# Patient Record
Sex: Female | Born: 1955 | Race: White | Hispanic: No | Marital: Single | Smoking: Never smoker
Health system: Southern US, Community
[De-identification: ages and names within clinical notes are randomized; demographics above are authoritative.]

## PROBLEM LIST (undated history)

## (undated) DIAGNOSIS — I499 Cardiac arrhythmia, unspecified: Secondary | ICD-10-CM

## (undated) DIAGNOSIS — R002 Palpitations: Secondary | ICD-10-CM

## (undated) DIAGNOSIS — E785 Hyperlipidemia, unspecified: Secondary | ICD-10-CM

## (undated) DIAGNOSIS — C801 Malignant (primary) neoplasm, unspecified: Secondary | ICD-10-CM

## (undated) DIAGNOSIS — M5412 Radiculopathy, cervical region: Secondary | ICD-10-CM

## (undated) DIAGNOSIS — M797 Fibromyalgia: Secondary | ICD-10-CM

## (undated) DIAGNOSIS — K219 Gastro-esophageal reflux disease without esophagitis: Secondary | ICD-10-CM

## (undated) DIAGNOSIS — R011 Cardiac murmur, unspecified: Secondary | ICD-10-CM

## (undated) DIAGNOSIS — Z8659 Personal history of other mental and behavioral disorders: Secondary | ICD-10-CM

## (undated) DIAGNOSIS — R928 Other abnormal and inconclusive findings on diagnostic imaging of breast: Secondary | ICD-10-CM

## (undated) DIAGNOSIS — F419 Anxiety disorder, unspecified: Secondary | ICD-10-CM

## (undated) DIAGNOSIS — IMO0002 Reserved for concepts with insufficient information to code with codable children: Secondary | ICD-10-CM

## (undated) DIAGNOSIS — D649 Anemia, unspecified: Secondary | ICD-10-CM

## (undated) DIAGNOSIS — K449 Diaphragmatic hernia without obstruction or gangrene: Secondary | ICD-10-CM

## (undated) DIAGNOSIS — R0602 Shortness of breath: Secondary | ICD-10-CM

## (undated) DIAGNOSIS — L570 Actinic keratosis: Secondary | ICD-10-CM

## (undated) DIAGNOSIS — F32A Depression, unspecified: Secondary | ICD-10-CM

## (undated) DIAGNOSIS — J449 Chronic obstructive pulmonary disease, unspecified: Secondary | ICD-10-CM

## (undated) DIAGNOSIS — J45909 Unspecified asthma, uncomplicated: Secondary | ICD-10-CM

## (undated) DIAGNOSIS — F329 Major depressive disorder, single episode, unspecified: Secondary | ICD-10-CM

## (undated) DIAGNOSIS — M5030 Other cervical disc degeneration, unspecified cervical region: Secondary | ICD-10-CM

## (undated) DIAGNOSIS — Z915 Personal history of self-harm: Secondary | ICD-10-CM

## (undated) DIAGNOSIS — G4700 Insomnia, unspecified: Secondary | ICD-10-CM

## (undated) DIAGNOSIS — I2510 Atherosclerotic heart disease of native coronary artery without angina pectoris: Secondary | ICD-10-CM

## (undated) DIAGNOSIS — Z8489 Family history of other specified conditions: Secondary | ICD-10-CM

## (undated) DIAGNOSIS — G8929 Other chronic pain: Secondary | ICD-10-CM

## (undated) DIAGNOSIS — K589 Irritable bowel syndrome without diarrhea: Secondary | ICD-10-CM

## (undated) DIAGNOSIS — Z8719 Personal history of other diseases of the digestive system: Secondary | ICD-10-CM

## (undated) DIAGNOSIS — N809 Endometriosis, unspecified: Secondary | ICD-10-CM

## (undated) HISTORY — DX: Depression, unspecified: F32.A

## (undated) HISTORY — DX: Atherosclerotic heart disease of native coronary artery without angina pectoris: I25.10

## (undated) HISTORY — DX: Insomnia, unspecified: G47.00

## (undated) HISTORY — DX: Shortness of breath: R06.02

## (undated) HISTORY — DX: Gastro-esophageal reflux disease without esophagitis: K21.9

## (undated) HISTORY — DX: Endometriosis, unspecified: N80.9

## (undated) HISTORY — DX: Major depressive disorder, single episode, unspecified: F32.9

## (undated) HISTORY — DX: Hyperlipidemia, unspecified: E78.5

## (undated) HISTORY — DX: Reserved for concepts with insufficient information to code with codable children: IMO0002

## (undated) HISTORY — PX: ANKLE SURGERY: SHX546

## (undated) HISTORY — PX: BREAST SURGERY: SHX581

## (undated) HISTORY — PX: FRACTURE SURGERY: SHX138

## (undated) HISTORY — DX: Palpitations: R00.2

## (undated) HISTORY — PX: SKIN LESION EXCISION: SHX2412

## (undated) HISTORY — DX: Anxiety disorder, unspecified: F41.9

## (undated) HISTORY — DX: Malignant (primary) neoplasm, unspecified: C80.1

## (undated) HISTORY — DX: Personal history of other mental and behavioral disorders: Z86.59

## (undated) HISTORY — DX: Personal history of self-harm: Z91.5

## (undated) HISTORY — DX: Other abnormal and inconclusive findings on diagnostic imaging of breast: R92.8

## (undated) HISTORY — DX: Chronic obstructive pulmonary disease, unspecified: J44.9

## (undated) HISTORY — DX: Radiculopathy, cervical region: M54.12

## (undated) HISTORY — DX: Family history of other specified conditions: Z84.89

## (undated) HISTORY — DX: Personal history of other diseases of the digestive system: Z87.19

## (undated) HISTORY — DX: Unspecified asthma, uncomplicated: J45.909

## (undated) HISTORY — DX: Diaphragmatic hernia without obstruction or gangrene: K44.9

## (undated) HISTORY — DX: Fibromyalgia: M79.7

## (undated) HISTORY — DX: Actinic keratosis: L57.0

## (undated) HISTORY — DX: Cardiac arrhythmia, unspecified: I49.9

## (undated) HISTORY — DX: Irritable bowel syndrome, unspecified: K58.9

## (undated) HISTORY — DX: Other chronic pain: G89.29

---
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
## 1984-09-12 HISTORY — PX: ABDOMINAL HYSTERECTOMY: SHX81

## 1984-09-12 HISTORY — PX: OOPHORECTOMY: SHX86

## 1998-04-24 ENCOUNTER — Inpatient Hospital Stay (HOSPITAL_COMMUNITY): Admission: EM | Admit: 1998-04-24 | Discharge: 1998-05-04 | Payer: Self-pay | Admitting: Emergency Medicine

## 1999-02-21 ENCOUNTER — Inpatient Hospital Stay (HOSPITAL_COMMUNITY): Admission: EM | Admit: 1999-02-21 | Discharge: 1999-03-02 | Payer: Self-pay | Admitting: Emergency Medicine

## 2004-07-08 ENCOUNTER — Ambulatory Visit: Payer: Self-pay | Admitting: Anesthesiology

## 2004-08-03 ENCOUNTER — Emergency Department: Payer: Self-pay | Admitting: Emergency Medicine

## 2004-08-09 ENCOUNTER — Ambulatory Visit: Payer: Self-pay | Admitting: Family Medicine

## 2004-08-19 ENCOUNTER — Emergency Department: Payer: Self-pay | Admitting: Internal Medicine

## 2004-08-19 ENCOUNTER — Other Ambulatory Visit: Payer: Self-pay

## 2004-09-15 ENCOUNTER — Ambulatory Visit: Payer: Self-pay | Admitting: Anesthesiology

## 2004-10-12 ENCOUNTER — Ambulatory Visit: Payer: Self-pay | Admitting: Anesthesiology

## 2004-10-13 ENCOUNTER — Ambulatory Visit: Payer: Self-pay | Admitting: Anesthesiology

## 2004-10-18 ENCOUNTER — Ambulatory Visit: Payer: Self-pay | Admitting: Anesthesiology

## 2004-11-15 ENCOUNTER — Ambulatory Visit: Payer: Self-pay | Admitting: Anesthesiology

## 2004-12-20 ENCOUNTER — Ambulatory Visit: Payer: Self-pay | Admitting: Anesthesiology

## 2005-01-25 ENCOUNTER — Ambulatory Visit: Payer: Self-pay | Admitting: Anesthesiology

## 2005-02-23 ENCOUNTER — Ambulatory Visit: Payer: Self-pay | Admitting: Anesthesiology

## 2005-03-30 ENCOUNTER — Ambulatory Visit: Payer: Self-pay | Admitting: Anesthesiology

## 2005-04-21 ENCOUNTER — Ambulatory Visit: Payer: Self-pay | Admitting: Anesthesiology

## 2005-04-28 ENCOUNTER — Ambulatory Visit: Payer: Self-pay | Admitting: Anesthesiology

## 2005-05-26 ENCOUNTER — Ambulatory Visit: Payer: Self-pay | Admitting: Anesthesiology

## 2005-06-04 ENCOUNTER — Emergency Department: Payer: Self-pay | Admitting: Emergency Medicine

## 2005-06-22 ENCOUNTER — Ambulatory Visit: Payer: Self-pay | Admitting: Anesthesiology

## 2005-07-28 ENCOUNTER — Ambulatory Visit: Payer: Self-pay | Admitting: Anesthesiology

## 2005-09-19 ENCOUNTER — Ambulatory Visit: Payer: Self-pay | Admitting: Anesthesiology

## 2005-10-26 ENCOUNTER — Ambulatory Visit: Payer: Self-pay | Admitting: Anesthesiology

## 2005-11-09 ENCOUNTER — Ambulatory Visit: Payer: Self-pay | Admitting: Internal Medicine

## 2005-11-22 ENCOUNTER — Ambulatory Visit: Payer: Self-pay | Admitting: Anesthesiology

## 2005-12-12 ENCOUNTER — Ambulatory Visit: Payer: Self-pay | Admitting: Anesthesiology

## 2006-01-30 ENCOUNTER — Ambulatory Visit: Payer: Self-pay | Admitting: Anesthesiology

## 2006-02-27 ENCOUNTER — Ambulatory Visit: Payer: Self-pay | Admitting: Anesthesiology

## 2006-03-22 ENCOUNTER — Ambulatory Visit: Payer: Self-pay | Admitting: Anesthesiology

## 2006-04-06 ENCOUNTER — Ambulatory Visit: Payer: Self-pay | Admitting: Gastroenterology

## 2006-04-26 ENCOUNTER — Ambulatory Visit: Payer: Self-pay | Admitting: Anesthesiology

## 2006-05-24 ENCOUNTER — Ambulatory Visit: Payer: Self-pay | Admitting: Anesthesiology

## 2006-06-21 ENCOUNTER — Ambulatory Visit: Payer: Self-pay | Admitting: Anesthesiology

## 2006-08-09 ENCOUNTER — Ambulatory Visit: Payer: Self-pay | Admitting: Anesthesiology

## 2006-08-31 ENCOUNTER — Ambulatory Visit: Payer: Self-pay | Admitting: Family Medicine

## 2006-09-21 ENCOUNTER — Ambulatory Visit: Payer: Self-pay | Admitting: Family Medicine

## 2006-11-09 ENCOUNTER — Ambulatory Visit: Payer: Self-pay | Admitting: Anesthesiology

## 2006-12-11 ENCOUNTER — Ambulatory Visit: Payer: Self-pay | Admitting: Anesthesiology

## 2007-01-02 ENCOUNTER — Ambulatory Visit: Payer: Self-pay | Admitting: Anesthesiology

## 2007-01-10 ENCOUNTER — Ambulatory Visit: Payer: Self-pay | Admitting: Anesthesiology

## 2007-02-07 ENCOUNTER — Ambulatory Visit: Payer: Self-pay | Admitting: Anesthesiology

## 2007-03-08 DIAGNOSIS — C4492 Squamous cell carcinoma of skin, unspecified: Secondary | ICD-10-CM

## 2007-03-08 HISTORY — DX: Squamous cell carcinoma of skin, unspecified: C44.92

## 2007-03-28 ENCOUNTER — Ambulatory Visit: Payer: Self-pay | Admitting: Anesthesiology

## 2007-05-23 ENCOUNTER — Ambulatory Visit: Payer: Self-pay | Admitting: Anesthesiology

## 2007-07-11 ENCOUNTER — Ambulatory Visit: Payer: Self-pay | Admitting: Anesthesiology

## 2007-08-06 ENCOUNTER — Ambulatory Visit: Payer: Self-pay | Admitting: Anesthesiology

## 2007-09-26 ENCOUNTER — Emergency Department: Payer: Self-pay | Admitting: Emergency Medicine

## 2007-10-04 ENCOUNTER — Ambulatory Visit: Payer: Self-pay | Admitting: Anesthesiology

## 2007-11-08 ENCOUNTER — Ambulatory Visit: Payer: Self-pay | Admitting: Anesthesiology

## 2007-11-29 ENCOUNTER — Ambulatory Visit: Payer: Self-pay | Admitting: Anesthesiology

## 2008-01-02 ENCOUNTER — Ambulatory Visit: Payer: Self-pay | Admitting: Anesthesiology

## 2008-01-31 ENCOUNTER — Ambulatory Visit: Payer: Self-pay | Admitting: Anesthesiology

## 2008-02-18 ENCOUNTER — Ambulatory Visit: Payer: Self-pay | Admitting: Family Medicine

## 2008-02-21 ENCOUNTER — Ambulatory Visit: Payer: Self-pay | Admitting: Anesthesiology

## 2008-04-24 DIAGNOSIS — C44529 Squamous cell carcinoma of skin of other part of trunk: Secondary | ICD-10-CM

## 2008-04-24 DIAGNOSIS — C4491 Basal cell carcinoma of skin, unspecified: Secondary | ICD-10-CM

## 2008-04-24 HISTORY — DX: Squamous cell carcinoma of skin of other part of trunk: C44.529

## 2008-04-24 HISTORY — DX: Basal cell carcinoma of skin, unspecified: C44.91

## 2008-06-03 ENCOUNTER — Ambulatory Visit: Payer: Self-pay | Admitting: Anesthesiology

## 2008-06-30 ENCOUNTER — Ambulatory Visit: Payer: Self-pay | Admitting: Anesthesiology

## 2008-07-31 ENCOUNTER — Ambulatory Visit: Payer: Self-pay | Admitting: Anesthesiology

## 2008-09-01 ENCOUNTER — Ambulatory Visit: Payer: Self-pay | Admitting: Anesthesiology

## 2008-09-25 ENCOUNTER — Ambulatory Visit: Payer: Self-pay | Admitting: Anesthesiology

## 2008-11-06 ENCOUNTER — Ambulatory Visit: Payer: Self-pay | Admitting: Anesthesiology

## 2009-01-13 ENCOUNTER — Ambulatory Visit: Payer: Self-pay | Admitting: Anesthesiology

## 2009-04-22 ENCOUNTER — Ambulatory Visit: Payer: Self-pay | Admitting: Family Medicine

## 2009-05-19 ENCOUNTER — Ambulatory Visit: Payer: Self-pay | Admitting: Anesthesiology

## 2009-06-26 ENCOUNTER — Ambulatory Visit: Payer: Self-pay | Admitting: Anesthesiology

## 2009-07-27 ENCOUNTER — Ambulatory Visit: Payer: Self-pay | Admitting: Anesthesiology

## 2009-10-01 ENCOUNTER — Ambulatory Visit: Payer: Self-pay | Admitting: Anesthesiology

## 2009-10-29 ENCOUNTER — Ambulatory Visit: Payer: Self-pay | Admitting: Anesthesiology

## 2010-01-06 ENCOUNTER — Ambulatory Visit: Payer: Self-pay | Admitting: Anesthesiology

## 2010-01-28 ENCOUNTER — Ambulatory Visit: Payer: Self-pay | Admitting: Anesthesiology

## 2010-03-10 ENCOUNTER — Ambulatory Visit: Payer: Self-pay | Admitting: Anesthesiology

## 2010-03-19 ENCOUNTER — Ambulatory Visit: Payer: Self-pay | Admitting: Anesthesiology

## 2010-03-31 ENCOUNTER — Ambulatory Visit: Payer: Self-pay | Admitting: Anesthesiology

## 2010-05-11 ENCOUNTER — Ambulatory Visit: Payer: Self-pay | Admitting: Family Medicine

## 2010-05-19 ENCOUNTER — Ambulatory Visit: Payer: Self-pay | Admitting: Anesthesiology

## 2010-06-16 ENCOUNTER — Ambulatory Visit: Payer: Self-pay | Admitting: Anesthesiology

## 2010-07-23 ENCOUNTER — Emergency Department: Payer: Self-pay | Admitting: Emergency Medicine

## 2010-07-23 ENCOUNTER — Ambulatory Visit: Payer: Self-pay | Admitting: Anesthesiology

## 2010-08-13 ENCOUNTER — Ambulatory Visit: Payer: Self-pay | Admitting: Anesthesiology

## 2010-09-30 ENCOUNTER — Ambulatory Visit: Payer: Self-pay | Admitting: Anesthesiology

## 2010-10-21 ENCOUNTER — Ambulatory Visit: Payer: Self-pay | Admitting: Anesthesiology

## 2010-11-11 ENCOUNTER — Ambulatory Visit: Payer: Self-pay | Admitting: Anesthesiology

## 2010-12-22 ENCOUNTER — Ambulatory Visit: Payer: Self-pay | Admitting: Anesthesiology

## 2011-02-23 ENCOUNTER — Ambulatory Visit: Payer: Self-pay | Admitting: Anesthesiology

## 2011-05-10 ENCOUNTER — Ambulatory Visit: Payer: Self-pay | Admitting: Anesthesiology

## 2011-06-03 ENCOUNTER — Ambulatory Visit: Payer: Self-pay | Admitting: Anesthesiology

## 2011-06-21 ENCOUNTER — Ambulatory Visit: Payer: Self-pay | Admitting: Anesthesiology

## 2011-09-27 ENCOUNTER — Ambulatory Visit: Payer: Self-pay | Admitting: Family Medicine

## 2011-11-02 ENCOUNTER — Ambulatory Visit: Payer: Self-pay | Admitting: Anesthesiology

## 2011-12-26 ENCOUNTER — Ambulatory Visit: Payer: Self-pay | Admitting: Pain Medicine

## 2011-12-26 ENCOUNTER — Other Ambulatory Visit: Payer: Self-pay | Admitting: Pain Medicine

## 2011-12-26 LAB — FOLATE: Folic Acid: 16.2 ng/mL (ref 3.1–100.0)

## 2012-02-09 ENCOUNTER — Ambulatory Visit: Payer: Self-pay | Admitting: Pain Medicine

## 2012-03-06 ENCOUNTER — Ambulatory Visit: Payer: Self-pay | Admitting: Pain Medicine

## 2012-03-06 ENCOUNTER — Other Ambulatory Visit: Payer: Self-pay | Admitting: Pain Medicine

## 2012-03-06 LAB — PLATELET FUNCTION ASSAY: COL/EPI PLT FXN SCRN: 88 Seconds

## 2012-03-22 ENCOUNTER — Ambulatory Visit: Payer: Self-pay | Admitting: Pain Medicine

## 2012-04-18 ENCOUNTER — Ambulatory Visit: Payer: Self-pay | Admitting: Pain Medicine

## 2012-04-24 ENCOUNTER — Ambulatory Visit: Payer: Self-pay | Admitting: Pain Medicine

## 2012-05-23 ENCOUNTER — Ambulatory Visit: Payer: Self-pay | Admitting: Pain Medicine

## 2012-06-05 ENCOUNTER — Ambulatory Visit: Payer: Self-pay | Admitting: Pain Medicine

## 2012-06-28 ENCOUNTER — Ambulatory Visit: Payer: Self-pay | Admitting: Pain Medicine

## 2012-07-18 ENCOUNTER — Ambulatory Visit: Payer: Self-pay | Admitting: Pain Medicine

## 2012-09-25 ENCOUNTER — Ambulatory Visit: Payer: Self-pay | Admitting: Pain Medicine

## 2012-11-09 ENCOUNTER — Emergency Department: Payer: Self-pay | Admitting: Emergency Medicine

## 2012-11-09 LAB — CK TOTAL AND CKMB (NOT AT ARMC): CK-MB: <0.5 ng/mL — ABNORMAL LOW (ref 0.5–3.6)

## 2012-11-09 LAB — COMPREHENSIVE METABOLIC PANEL
Albumin: 3.1 g/dL — ABNORMAL LOW (ref 3.4–5.0)
Alkaline Phosphatase: 114 U/L (ref 50–136)
BUN: 8 mg/dL (ref 7–18)
Bilirubin,Total: 0.2 mg/dL (ref 0.2–1.0)
Calcium, Total: 9.1 mg/dL (ref 8.5–10.1)
Chloride: 101 mmol/L (ref 98–107)
Co2: 24 mmol/L (ref 21–32)
EGFR (African American): >60
Glucose: 151 mg/dL — ABNORMAL HIGH (ref 65–99)
Osmolality: 271 (ref 275–301)
Potassium: 4.4 mmol/L (ref 3.5–5.1)
SGOT(AST): 20 U/L (ref 15–37)
Sodium: 135 mmol/L — ABNORMAL LOW (ref 136–145)

## 2012-11-09 LAB — CBC
HCT: 36.1 % (ref 35.0–47.0)
HGB: 12.3 g/dL (ref 12.0–16.0)
MCH: 32.6 pg (ref 26.0–34.0)
MCV: 96 fL (ref 80–100)
Platelet: 415 10*3/uL (ref 150–440)
RBC: 3.76 10*6/uL — ABNORMAL LOW (ref 3.80–5.20)
RDW: 13.3 % (ref 11.5–14.5)

## 2012-11-28 ENCOUNTER — Ambulatory Visit: Payer: Self-pay | Admitting: Pain Medicine

## 2012-12-25 ENCOUNTER — Ambulatory Visit: Payer: Self-pay | Admitting: Pain Medicine

## 2013-01-16 ENCOUNTER — Ambulatory Visit: Payer: Self-pay | Admitting: Pain Medicine

## 2013-01-16 ENCOUNTER — Other Ambulatory Visit: Payer: Self-pay | Admitting: Pain Medicine

## 2013-01-16 LAB — BASIC METABOLIC PANEL WITH GFR
Anion Gap: 4 — ABNORMAL LOW
BUN: 12 mg/dL
Calcium, Total: 8.7 mg/dL
Chloride: 103 mmol/L
Co2: 28 mmol/L
Creatinine: 0.66 mg/dL
EGFR (African American): >60
EGFR (Non-African Amer.): >60
Glucose: 131 mg/dL — ABNORMAL HIGH
Osmolality: 272
Potassium: 4.4 mmol/L
Sodium: 135 mmol/L — ABNORMAL LOW

## 2013-03-22 ENCOUNTER — Ambulatory Visit: Payer: Self-pay | Admitting: Pain Medicine

## 2013-04-18 ENCOUNTER — Ambulatory Visit: Payer: Self-pay | Admitting: Pain Medicine

## 2013-05-01 ENCOUNTER — Ambulatory Visit: Payer: Self-pay | Admitting: Pain Medicine

## 2013-06-14 ENCOUNTER — Ambulatory Visit: Payer: Self-pay | Admitting: Pain Medicine

## 2013-06-14 ENCOUNTER — Other Ambulatory Visit: Payer: Self-pay | Admitting: Pain Medicine

## 2013-06-14 LAB — BASIC METABOLIC PANEL
Co2: 24 mmol/L (ref 21–32)
Creatinine: 0.94 mg/dL (ref 0.60–1.30)
EGFR (Non-African Amer.): >60
Glucose: 113 mg/dL — ABNORMAL HIGH (ref 65–99)
Sodium: 135 mmol/L — ABNORMAL LOW (ref 136–145)

## 2013-09-20 ENCOUNTER — Ambulatory Visit: Payer: Self-pay | Admitting: Pain Medicine

## 2013-12-16 ENCOUNTER — Ambulatory Visit: Payer: Self-pay | Admitting: Family Medicine

## 2014-01-07 ENCOUNTER — Ambulatory Visit: Payer: Self-pay | Admitting: Family Medicine

## 2014-02-10 ENCOUNTER — Ambulatory Visit: Payer: Self-pay | Admitting: Family Medicine

## 2014-02-14 LAB — PATHOLOGY REPORT

## 2014-02-21 ENCOUNTER — Ambulatory Visit: Payer: Self-pay | Admitting: Pain Medicine

## 2014-02-25 ENCOUNTER — Encounter: Payer: Self-pay | Admitting: General Surgery

## 2014-02-25 ENCOUNTER — Ambulatory Visit (INDEPENDENT_AMBULATORY_CARE_PROVIDER_SITE_OTHER): Payer: Medicare Other | Admitting: General Surgery

## 2014-02-25 VITALS — BP 112/78 | HR 70 | Resp 12 | Ht 63.0 in | Wt 161.0 lb

## 2014-02-25 DIAGNOSIS — R928 Other abnormal and inconclusive findings on diagnostic imaging of breast: Secondary | ICD-10-CM | POA: Insufficient documentation

## 2014-02-25 DIAGNOSIS — R920 Mammographic microcalcification found on diagnostic imaging of breast: Secondary | ICD-10-CM

## 2014-02-25 HISTORY — DX: Other abnormal and inconclusive findings on diagnostic imaging of breast: R92.8

## 2014-02-25 NOTE — Progress Notes (Signed)
Patient ID: Zoe Sanchez, female   DOB: 13-Aug-1956, 58 y.o.   MRN: [id number]  Chief Complaint  Patient presents with  . Other    mammogram    HPI Zoe Sanchez is a 58 y.o. female who presents for a breast evaluation. The most recent mammogram and right breast biopsy was done on 02/10/14. Patient does perform regular self breast checks and gets regular mammograms done.  States she can not feel the area of concern. Denies any breast injury. Denies family history of breast cancer.  HPI  Past Medical History  Diagnosis Date  . Asthma   . Degenerative disk disease   . Fibromyalgia   . Irritable bowel syndrome   . Endometriosis   . Insomnia   . Chronic pain     Past Surgical History  Procedure Laterality Date  . Abdominal hysterectomy  1986  . Tonsillectomy  1957  . Breast biopsy Right 2015    microcalcifications  . Skin lesion excision      basal cell and squamous cell    No family history on file.  Social History History  Substance Use Topics  . Smoking status: Never Smoker   . Smokeless tobacco: Not on file  . Alcohol Use: No    Allergies  Allergen Reactions  . Other     Steroid increases heart rate    Current Outpatient Prescriptions  Medication Sig Dispense Refill  . amitriptyline (ELAVIL) 100 MG tablet       . carisoprodol (SOMA) 350 MG tablet       . gemfibrozil (LOPID) 600 MG tablet       . HYDROcodone-acetaminophen (NORCO/VICODIN) 5-325 MG per tablet       . montelukast (SINGULAIR) 10 MG tablet       . omeprazole (PRILOSEC) 20 MG capsule       . pregabalin (LYRICA) 100 MG capsule Take 100 mg by mouth daily.       Marland Kitchen PREMARIN 1.25 MG tablet       . propranolol (INDERAL) 40 MG tablet Take 40 mg by mouth 4 (four) times daily.       . SEREVENT DISKUS 50 MCG/DOSE diskus inhaler       . tiotropium (SPIRIVA) 18 MCG inhalation capsule Place into inhaler and inhale.      . zolpidem (AMBIEN) 10 MG tablet       . zolpidem (AMBIEN) 10 MG tablet Take by mouth.        No current facility-administered medications for this visit.    Review of Systems Review of Systems  Constitutional: Negative.   Respiratory: Positive for shortness of breath.   Cardiovascular: Negative.     Blood pressure 112/78, pulse 70, resp. rate 12, height 5\' 3"  (1.6 m), weight 161 lb (73.029 kg).  Physical Exam Physical Exam  Constitutional: She is oriented to person, place, and time. She appears well-developed and well-nourished.  Neck: Neck supple.  Cardiovascular: Normal rate, regular rhythm and normal heart sounds.   Pulmonary/Chest: Effort normal and breath sounds normal.  Right breast > left breast, slightly 7 CFN right breast 9 o'clock biopsy site thickening.  Lymphadenopathy:    She has no cervical adenopathy.  Neurological: She is alert and oriented to person, place, and time.  Skin: Skin is warm and dry.    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 Microcalcifications were identified.  Mammograms of April through June 2015 were reviewed.  Assessment    Benign breast  exam. 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     Plan    Annual screening mammogram should be completed her primary care physician's office. There is no indication for surgical reexcision of this site.     Follow up with Dr Posey Pronto with regular mammograms.  PCP: Delos Haring 02/25/2014, 7:08 PM

## 2014-02-25 NOTE — Patient Instructions (Addendum)
Continue self breast exams. Call office for any new breast issues or concerns. Follow up with Dr Posey Pronto with regular mammograms.

## 2014-06-13 ENCOUNTER — Ambulatory Visit: Payer: Self-pay | Admitting: Pain Medicine

## 2014-07-14 ENCOUNTER — Encounter: Payer: Self-pay | Admitting: General Surgery

## 2014-09-12 HISTORY — PX: BREAST BIOPSY: SHX20

## 2014-12-23 DIAGNOSIS — C4491 Basal cell carcinoma of skin, unspecified: Secondary | ICD-10-CM

## 2014-12-23 HISTORY — DX: Basal cell carcinoma of skin, unspecified: C44.91

## 2015-06-29 ENCOUNTER — Ambulatory Visit: Payer: Medicare Other | Attending: Pain Medicine | Admitting: Pain Medicine

## 2015-06-29 ENCOUNTER — Encounter: Payer: Self-pay | Admitting: Pain Medicine

## 2015-06-29 VITALS — BP 121/54 | HR 68 | Temp 98.0°F | Resp 20 | Ht 62.0 in | Wt 150.0 lb

## 2015-06-29 DIAGNOSIS — F329 Major depressive disorder, single episode, unspecified: Secondary | ICD-10-CM

## 2015-06-29 DIAGNOSIS — M7918 Myalgia, other site: Secondary | ICD-10-CM

## 2015-06-29 DIAGNOSIS — M5412 Radiculopathy, cervical region: Secondary | ICD-10-CM | POA: Diagnosis not present

## 2015-06-29 DIAGNOSIS — K219 Gastro-esophageal reflux disease without esophagitis: Secondary | ICD-10-CM | POA: Diagnosis not present

## 2015-06-29 DIAGNOSIS — R55 Syncope and collapse: Secondary | ICD-10-CM | POA: Insufficient documentation

## 2015-06-29 DIAGNOSIS — M797 Fibromyalgia: Secondary | ICD-10-CM | POA: Diagnosis not present

## 2015-06-29 DIAGNOSIS — Z9151 Personal history of suicidal behavior: Secondary | ICD-10-CM

## 2015-06-29 DIAGNOSIS — M5020 Other cervical disc displacement, unspecified cervical region: Secondary | ICD-10-CM | POA: Insufficient documentation

## 2015-06-29 DIAGNOSIS — G542 Cervical root disorders, not elsewhere classified: Secondary | ICD-10-CM | POA: Insufficient documentation

## 2015-06-29 DIAGNOSIS — F1190 Opioid use, unspecified, uncomplicated: Secondary | ICD-10-CM

## 2015-06-29 DIAGNOSIS — Z9189 Other specified personal risk factors, not elsewhere classified: Secondary | ICD-10-CM

## 2015-06-29 DIAGNOSIS — G4701 Insomnia due to medical condition: Secondary | ICD-10-CM | POA: Insufficient documentation

## 2015-06-29 DIAGNOSIS — E538 Deficiency of other specified B group vitamins: Secondary | ICD-10-CM | POA: Insufficient documentation

## 2015-06-29 DIAGNOSIS — M79604 Pain in right leg: Secondary | ICD-10-CM

## 2015-06-29 DIAGNOSIS — M791 Myalgia: Secondary | ICD-10-CM

## 2015-06-29 DIAGNOSIS — E785 Hyperlipidemia, unspecified: Secondary | ICD-10-CM | POA: Diagnosis not present

## 2015-06-29 DIAGNOSIS — R197 Diarrhea, unspecified: Secondary | ICD-10-CM | POA: Insufficient documentation

## 2015-06-29 DIAGNOSIS — M79605 Pain in left leg: Secondary | ICD-10-CM

## 2015-06-29 DIAGNOSIS — Z79891 Long term (current) use of opiate analgesic: Secondary | ICD-10-CM

## 2015-06-29 DIAGNOSIS — Z8719 Personal history of other diseases of the digestive system: Secondary | ICD-10-CM | POA: Insufficient documentation

## 2015-06-29 DIAGNOSIS — I471 Supraventricular tachycardia: Secondary | ICD-10-CM | POA: Insufficient documentation

## 2015-06-29 DIAGNOSIS — G894 Chronic pain syndrome: Secondary | ICD-10-CM | POA: Diagnosis not present

## 2015-06-29 DIAGNOSIS — M5382 Other specified dorsopathies, cervical region: Secondary | ICD-10-CM | POA: Diagnosis not present

## 2015-06-29 DIAGNOSIS — G8929 Other chronic pain: Secondary | ICD-10-CM

## 2015-06-29 DIAGNOSIS — E612 Magnesium deficiency: Secondary | ICD-10-CM

## 2015-06-29 DIAGNOSIS — M79603 Pain in arm, unspecified: Secondary | ICD-10-CM

## 2015-06-29 DIAGNOSIS — F1120 Opioid dependence, uncomplicated: Secondary | ICD-10-CM

## 2015-06-29 DIAGNOSIS — M542 Cervicalgia: Secondary | ICD-10-CM | POA: Diagnosis present

## 2015-06-29 DIAGNOSIS — J45909 Unspecified asthma, uncomplicated: Secondary | ICD-10-CM | POA: Insufficient documentation

## 2015-06-29 DIAGNOSIS — F419 Anxiety disorder, unspecified: Secondary | ICD-10-CM | POA: Insufficient documentation

## 2015-06-29 DIAGNOSIS — Z8489 Family history of other specified conditions: Secondary | ICD-10-CM

## 2015-06-29 DIAGNOSIS — M549 Dorsalgia, unspecified: Secondary | ICD-10-CM | POA: Diagnosis present

## 2015-06-29 DIAGNOSIS — K589 Irritable bowel syndrome without diarrhea: Secondary | ICD-10-CM | POA: Diagnosis not present

## 2015-06-29 DIAGNOSIS — M47892 Other spondylosis, cervical region: Secondary | ICD-10-CM | POA: Diagnosis not present

## 2015-06-29 DIAGNOSIS — R002 Palpitations: Secondary | ICD-10-CM | POA: Insufficient documentation

## 2015-06-29 DIAGNOSIS — M4800 Spinal stenosis, site unspecified: Secondary | ICD-10-CM | POA: Insufficient documentation

## 2015-06-29 DIAGNOSIS — K5909 Other constipation: Secondary | ICD-10-CM | POA: Insufficient documentation

## 2015-06-29 DIAGNOSIS — I2510 Atherosclerotic heart disease of native coronary artery without angina pectoris: Secondary | ICD-10-CM

## 2015-06-29 DIAGNOSIS — M47812 Spondylosis without myelopathy or radiculopathy, cervical region: Secondary | ICD-10-CM

## 2015-06-29 DIAGNOSIS — J449 Chronic obstructive pulmonary disease, unspecified: Secondary | ICD-10-CM | POA: Insufficient documentation

## 2015-06-29 DIAGNOSIS — M5442 Lumbago with sciatica, left side: Secondary | ICD-10-CM

## 2015-06-29 DIAGNOSIS — M5441 Lumbago with sciatica, right side: Secondary | ICD-10-CM

## 2015-06-29 DIAGNOSIS — F411 Generalized anxiety disorder: Secondary | ICD-10-CM

## 2015-06-29 DIAGNOSIS — M545 Low back pain, unspecified: Secondary | ICD-10-CM

## 2015-06-29 DIAGNOSIS — M47816 Spondylosis without myelopathy or radiculopathy, lumbar region: Secondary | ICD-10-CM | POA: Insufficient documentation

## 2015-06-29 DIAGNOSIS — Z8659 Personal history of other mental and behavioral disorders: Secondary | ICD-10-CM

## 2015-06-29 DIAGNOSIS — K5900 Constipation, unspecified: Secondary | ICD-10-CM

## 2015-06-29 DIAGNOSIS — Z915 Personal history of self-harm: Secondary | ICD-10-CM | POA: Insufficient documentation

## 2015-06-29 DIAGNOSIS — R0602 Shortness of breath: Secondary | ICD-10-CM | POA: Insufficient documentation

## 2015-06-29 DIAGNOSIS — M4802 Spinal stenosis, cervical region: Secondary | ICD-10-CM

## 2015-06-29 DIAGNOSIS — M4726 Other spondylosis with radiculopathy, lumbar region: Secondary | ICD-10-CM

## 2015-06-29 DIAGNOSIS — M25512 Pain in left shoulder: Secondary | ICD-10-CM

## 2015-06-29 DIAGNOSIS — F418 Other specified anxiety disorders: Secondary | ICD-10-CM | POA: Diagnosis not present

## 2015-06-29 DIAGNOSIS — Z5181 Encounter for therapeutic drug level monitoring: Secondary | ICD-10-CM

## 2015-06-29 DIAGNOSIS — F32A Depression, unspecified: Secondary | ICD-10-CM | POA: Insufficient documentation

## 2015-06-29 DIAGNOSIS — G4700 Insomnia, unspecified: Secondary | ICD-10-CM

## 2015-06-29 DIAGNOSIS — M25511 Pain in right shoulder: Secondary | ICD-10-CM

## 2015-06-29 DIAGNOSIS — R32 Unspecified urinary incontinence: Secondary | ICD-10-CM

## 2015-06-29 HISTORY — DX: Personal history of other diseases of the digestive system: Z87.19

## 2015-06-29 HISTORY — DX: Palpitations: R00.2

## 2015-06-29 HISTORY — DX: Personal history of other mental and behavioral disorders: Z86.59

## 2015-06-29 HISTORY — DX: Shortness of breath: R06.02

## 2015-06-29 HISTORY — DX: Personal history of suicidal behavior: Z91.51

## 2015-06-29 HISTORY — DX: Radiculopathy, cervical region: M54.12

## 2015-06-29 HISTORY — DX: Family history of other specified conditions: Z84.89

## 2015-06-29 MED ORDER — HYDROCODONE-ACETAMINOPHEN 5-325 MG PO TABS: 1.0000 | ORAL_TABLET | ORAL | Status: DC | PRN

## 2015-06-29 MED ORDER — PREGABALIN 25 MG PO CAPS: 25.0000 mg | ORAL_CAPSULE | Freq: Every day | ORAL | Status: DC

## 2015-06-29 MED ORDER — VITAMIN D3 50 MCG (2000 UT) PO CAPS: 2000.0000 [IU] | ORAL_CAPSULE | Freq: Every day | ORAL | Status: DC

## 2015-06-29 MED ORDER — CYANOCOBALAMIN 2000 MCG PO TABS: 2000.0000 ug | ORAL_TABLET | Freq: Every day | ORAL | Status: DC

## 2015-06-29 MED ORDER — PREGABALIN 100 MG PO CAPS: 100.0000 mg | ORAL_CAPSULE | Freq: Every day | ORAL | Status: DC

## 2015-06-29 MED ORDER — MAGNESIUM OXIDE -MG SUPPLEMENT 500 MG PO CAPS: 1.0000 | ORAL_CAPSULE | Freq: Every day | ORAL | Status: DC

## 2015-06-29 MED ORDER — CARISOPRODOL 350 MG PO TABS: 350.0000 mg | ORAL_TABLET | Freq: Three times a day (TID) | ORAL | Status: DC

## 2015-06-29 NOTE — Progress Notes (Signed)
   Subjective:    Patient ID: Zoe Sanchez, female    DOB: Dec 21, 1955, 59 y.o.   MRN: [id number]  HPI    Review of Systems     Objective:   Physical Exam        Assessment & Plan:

## 2015-06-29 NOTE — Progress Notes (Signed)
Patient's Name: Zoe Sanchez MRN: [id number] DOB: 26-Jun-1956 DOS: 06/29/2015  Primary Reason(s) for Visit: Encounter for Medication Management. CC: Back Pain and Neck Pain   HPI:   Zoe Sanchez is a 59 y.o. year old, female patient, who returns today as an established patient. She has Abnormal mammogram; Microcalcifications of the breast; Encounter for therapeutic drug level monitoring; Long term current use of opiate analgesic; Uncomplicated opioid dependence ([place]); Opiate use; Magnesium deficiency; Vitamin B12 deficiency; Myofascial pain syndrome; Cervical radicular pain; Cervical facet syndrome; Chronic neck pain; Chronic pain syndrome; Cervical spondylosis without myelopathy; Anxiety; Awareness of heartbeats; Paroxysmal supraventricular tachycardia ([place]); Breath shortness; and Syncope and collapse on her problem list.. Her primarily concern today is the Back Pain and Neck Pain   The patient comes into the clinic today indicating that she is really doing rather well. She has taken up bicycling and although she has had a couple of falls overall she sees it as a positive thing.  Pharmacotherapy Review: Side-effects or Adverse reactions: None reported. Effectiveness: Described as relatively effective, allowing for increase in activities of daily living (ADL). Onset of action: Within expected pharmacological parameters. Duration of action: Within normal limits for medication. Peak effect: Timing and results are as within normal expected parameters. House PMP: Compliant with practice rules and regulations. DST: Compliant with practice rules and regulations. Lab work: No new labs ordered by our practice. Treatment compliance: Compliant. Substance Use Disorder (SUD) Risk Level: Low Planned course of action: Continue therapy as is.  Allergies: Zoe Sanchez is allergic to other.  Meds: The patient has a current medication list which includes the following prescription(s): amitriptyline,  carisoprodol, estradiol, gemfibrozil, hydrocodone-acetaminophen, montelukast, omeprazole, pregabalin, pregabalin, propranolol, serevent diskus, umeclidinium bromide, zolpidem, zolpidem, vitamin d3, cyanocobalamin, hydrocodone-acetaminophen, hydrocodone-acetaminophen, magnesium oxide, premarin, and tiotropium. Requested Prescriptions   Signed Prescriptions Disp Refills  . carisoprodol (SOMA) 350 MG tablet 90 tablet 2    Sig: Take 1 tablet (350 mg total) by mouth 3 (three) times daily.  Marland Kitchen HYDROcodone-acetaminophen (NORCO/VICODIN) 5-325 MG tablet 180 tablet 0    Sig: Take 1 tablet by mouth every 4 (four) hours as needed for moderate pain.  . pregabalin (LYRICA) 100 MG capsule 30 capsule 2    Sig: Take 1 capsule (100 mg total) by mouth at bedtime.  . pregabalin (LYRICA) 25 MG capsule 30 capsule 2    Sig: Take 1 capsule (25 mg total) by mouth at bedtime.  Marland Kitchen HYDROcodone-acetaminophen (NORCO/VICODIN) 5-325 MG tablet 180 tablet 0    Sig: Take 1 tablet by mouth every 4 (four) hours as needed for moderate pain.  Marland Kitchen HYDROcodone-acetaminophen (NORCO/VICODIN) 5-325 MG tablet 180 tablet 0    Sig: Take 1 tablet by mouth every 4 (four) hours as needed for moderate pain.  . cyanocobalamin ([employer] VITAMIN B12) 2000 MCG tablet 30 tablet PRN    Sig: Take 1 tablet (2,000 mcg total) by mouth daily.  . Cholecalciferol (VITAMIN D3) 2000 UNITS capsule 30 capsule PRN    Sig: Take 1 capsule (2,000 Units total) by mouth daily.  . Magnesium Oxide 500 MG CAPS 100 capsule PRN    Sig: Take 1 capsule (500 mg total) by mouth daily.    ROS: Constitutional: Afebrile, no chills, well hydrated and well nourished Gastrointestinal: negative Musculoskeletal:negative Neurological: negative Behavioral/Psych: negative  PFSH: Medical:  Zoe Sanchez  has a past medical history of Asthma; Degenerative disk disease; Fibromyalgia; Irritable bowel syndrome; Endometriosis; Insomnia; Chronic pain; Anxiety; Depression; GERD  (gastroesophageal reflux disease); Fibromyalgia; CAD (coronary  artery disease); Insomnia; COPD (chronic obstructive pulmonary disease) ([place]); Hiatal hernia; Abnormal heart rhythm; Hyperlipidemia; and Cancer ([place]). Family: family history includes Heart disease in her father; Hypertension in her father and mother. Surgical:  has past surgical history that includes Abdominal hysterectomy (1986); Tonsillectomy (1957); Skin lesion excision; and Breast surgery. Tobacco:  reports that she has never smoked. She does not have any smokeless tobacco history on file. Alcohol:  reports that she does not drink alcohol. Drug:  reports that she does not use illicit drugs.  Physical Exam: Vitals:  Today's Vitals   06/29/15 1518 06/29/15 1519  BP: 121/54   Pulse: 68   Temp: 98 F (36.7 C)   TempSrc: Oral   Resp: 20   Height: 5\' 2"  (1.575 m)   Weight: 150 lb (68.04 kg)   SpO2: 99%   PainSc:  2   Calculated BMI: Body mass index is 27.43 kg/(m^2). General appearance: alert, cooperative, appears stated age, no distress and mildly obese Eyes: conjunctivae/corneas clear. PERRL, EOM's intact. Fundi benign. Lungs: No evidence respiratory distress, no audible rales or ronchi and no use of accessory muscles of respiration Neck: no adenopathy, no carotid bruit, no JVD, supple, symmetrical, trachea midline and thyroid not enlarged, symmetric, no tenderness/mass/nodules Back: symmetric, no curvature. ROM normal. No CVA tenderness. Extremities: extremities normal, atraumatic, no cyanosis or edema Pulses: 2+ and symmetric Skin: Skin color, texture, turgor normal. No rashes or lesions Neurologic: Grossly normal    Assessment: Encounter Diagnosis:  Primary Diagnosis: Chronic pain syndrome [G89.4]  Plan: Zoe Sanchez was seen today for back pain and neck pain.  Diagnoses and all orders for this visit:  Chronic pain syndrome -     HYDROcodone-acetaminophen (NORCO/VICODIN) 5-325 MG tablet; Take 1 tablet by mouth every  4 (four) hours as needed for moderate pain. -     HYDROcodone-acetaminophen (NORCO/VICODIN) 5-325 MG tablet; Take 1 tablet by mouth every 4 (four) hours as needed for moderate pain. -     HYDROcodone-acetaminophen (NORCO/VICODIN) 5-325 MG tablet; Take 1 tablet by mouth every 4 (four) hours as needed for moderate pain. -     Cholecalciferol (VITAMIN D3) 2000 UNITS capsule; Take 1 capsule (2,000 Units total) by mouth daily.  Chronic neck pain  Cervical facet syndrome  Cervical radicular pain  Cervical spondylosis without myelopathy  Myofascial pain syndrome -     carisoprodol (SOMA) 350 MG tablet; Take 1 tablet (350 mg total) by mouth 3 (three) times daily.  Vitamin B12 deficiency -     cyanocobalamin ([employer] VITAMIN B12) 2000 MCG tablet; Take 1 tablet (2,000 mcg total) by mouth daily.  Magnesium deficiency -     Magnesium Oxide 500 MG CAPS; Take 1 capsule (500 mg total) by mouth daily.  Opiate use  Uncomplicated opioid dependence ([place])  Long term current use of opiate analgesic -     Drugs of abuse screen w/o alc, rtn urine-sln; Standing  Encounter for therapeutic drug level monitoring  Fibromyalgia -     pregabalin (LYRICA) 100 MG capsule; Take 1 capsule (100 mg total) by mouth at bedtime. -     pregabalin (LYRICA) 25 MG capsule; Take 1 capsule (25 mg total) by mouth at bedtime.     There are no Patient Instructions on file for this visit. Medications discontinued today:  Medications Discontinued During This Encounter  Medication Reason  . carisoprodol (SOMA) 350 MG tablet Reorder  . HYDROcodone-acetaminophen (NORCO/VICODIN) 5-325 MG per tablet Reorder  . pregabalin (LYRICA) 100 MG capsule Reorder  .  pregabalin (LYRICA) 25 MG capsule Reorder   Medications administered today:  Zoe Sanchez had no medications administered during this visit.  Primary Care Physician: Baltazar Apo, MD Location: [facility] Note by: Kathlen Brunswick. Dossie Arbour, M.D,  DABA, DABAPM, DABPM, DABIPP, FIPP

## 2015-06-29 NOTE — Progress Notes (Signed)
Safety precautions to be maintained throughout the outpatient stay will include: orient to surroundings, keep bed in low position, maintain call bell within reach at all times, provide assistance with transfer out of bed and ambulation.  meds remaining:carisoprol 41/90                            Hydrocodone 91/180                            Lyrica 100mg  14/30                            lyrica 25mg   13/30

## 2015-07-20 ENCOUNTER — Other Ambulatory Visit: Payer: Self-pay | Admitting: Pain Medicine

## 2015-09-28 ENCOUNTER — Ambulatory Visit: Payer: Medicare Other | Attending: Pain Medicine | Admitting: Pain Medicine

## 2015-09-28 ENCOUNTER — Other Ambulatory Visit: Payer: Self-pay | Admitting: Pain Medicine

## 2015-09-28 ENCOUNTER — Encounter: Payer: Self-pay | Admitting: Pain Medicine

## 2015-09-28 ENCOUNTER — Encounter (INDEPENDENT_AMBULATORY_CARE_PROVIDER_SITE_OTHER): Payer: Self-pay

## 2015-09-28 VITALS — BP 131/56 | HR 81 | Temp 98.0°F | Resp 16 | Ht 62.0 in | Wt 150.0 lb

## 2015-09-28 DIAGNOSIS — E538 Deficiency of other specified B group vitamins: Secondary | ICD-10-CM | POA: Insufficient documentation

## 2015-09-28 DIAGNOSIS — E559 Vitamin D deficiency, unspecified: Secondary | ICD-10-CM | POA: Diagnosis not present

## 2015-09-28 DIAGNOSIS — M5412 Radiculopathy, cervical region: Secondary | ICD-10-CM

## 2015-09-28 DIAGNOSIS — M79605 Pain in left leg: Secondary | ICD-10-CM

## 2015-09-28 DIAGNOSIS — M545 Low back pain, unspecified: Secondary | ICD-10-CM

## 2015-09-28 DIAGNOSIS — M797 Fibromyalgia: Secondary | ICD-10-CM | POA: Insufficient documentation

## 2015-09-28 DIAGNOSIS — K589 Irritable bowel syndrome without diarrhea: Secondary | ICD-10-CM | POA: Insufficient documentation

## 2015-09-28 DIAGNOSIS — M546 Pain in thoracic spine: Secondary | ICD-10-CM | POA: Diagnosis present

## 2015-09-28 DIAGNOSIS — E785 Hyperlipidemia, unspecified: Secondary | ICD-10-CM | POA: Diagnosis not present

## 2015-09-28 DIAGNOSIS — Z79899 Other long term (current) drug therapy: Secondary | ICD-10-CM

## 2015-09-28 DIAGNOSIS — E612 Magnesium deficiency: Secondary | ICD-10-CM | POA: Insufficient documentation

## 2015-09-28 DIAGNOSIS — F1190 Opioid use, unspecified, uncomplicated: Secondary | ICD-10-CM | POA: Insufficient documentation

## 2015-09-28 DIAGNOSIS — I2510 Atherosclerotic heart disease of native coronary artery without angina pectoris: Secondary | ICD-10-CM | POA: Insufficient documentation

## 2015-09-28 DIAGNOSIS — M791 Myalgia: Secondary | ICD-10-CM

## 2015-09-28 DIAGNOSIS — M5020 Other cervical disc displacement, unspecified cervical region: Secondary | ICD-10-CM

## 2015-09-28 DIAGNOSIS — M50222 Other cervical disc displacement at C5-C6 level: Secondary | ICD-10-CM | POA: Insufficient documentation

## 2015-09-28 DIAGNOSIS — F329 Major depressive disorder, single episode, unspecified: Secondary | ICD-10-CM | POA: Insufficient documentation

## 2015-09-28 DIAGNOSIS — Z79891 Long term (current) use of opiate analgesic: Secondary | ICD-10-CM

## 2015-09-28 DIAGNOSIS — M79604 Pain in right leg: Secondary | ICD-10-CM

## 2015-09-28 DIAGNOSIS — M542 Cervicalgia: Secondary | ICD-10-CM | POA: Insufficient documentation

## 2015-09-28 DIAGNOSIS — M50223 Other cervical disc displacement at C6-C7 level: Secondary | ICD-10-CM | POA: Diagnosis not present

## 2015-09-28 DIAGNOSIS — J45909 Unspecified asthma, uncomplicated: Secondary | ICD-10-CM | POA: Diagnosis not present

## 2015-09-28 DIAGNOSIS — G8929 Other chronic pain: Secondary | ICD-10-CM | POA: Diagnosis not present

## 2015-09-28 DIAGNOSIS — G894 Chronic pain syndrome: Secondary | ICD-10-CM

## 2015-09-28 DIAGNOSIS — Z5181 Encounter for therapeutic drug level monitoring: Secondary | ICD-10-CM

## 2015-09-28 DIAGNOSIS — M7918 Myalgia, other site: Secondary | ICD-10-CM

## 2015-09-28 LAB — SEDIMENTATION RATE: Sed Rate: 26 mm/hr (ref 0–30)

## 2015-09-28 LAB — MAGNESIUM: Magnesium: 1.8 mg/dL (ref 1.7–2.4)

## 2015-09-28 LAB — COMPREHENSIVE METABOLIC PANEL
ALBUMIN: 3.5 g/dL (ref 3.5–5.0)
ALK PHOS: 100 U/L (ref 38–126)
ALT: 22 U/L (ref 14–54)
ANION GAP: 6 (ref 5–15)
AST: 19 U/L (ref 15–41)
BILIRUBIN TOTAL: 0.5 mg/dL (ref 0.3–1.2)
BUN: 11 mg/dL (ref 6–20)
CALCIUM: 8.9 mg/dL (ref 8.9–10.3)
CO2: 25 mmol/L (ref 22–32)
Chloride: 106 mmol/L (ref 101–111)
Creatinine, Ser: 0.7 mg/dL (ref 0.44–1.00)
GFR calc non Af Amer: >60 mL/min (ref >60)
GLUCOSE: 97 mg/dL (ref 65–99)
Potassium: 4.2 mmol/L (ref 3.5–5.1)
Sodium: 137 mmol/L (ref 135–145)
TOTAL PROTEIN: 6.8 g/dL (ref 6.5–8.1)

## 2015-09-28 MED ORDER — CYANOCOBALAMIN 2000 MCG PO TABS: 2000.0000 ug | ORAL_TABLET | Freq: Every day | ORAL | Status: DC

## 2015-09-28 MED ORDER — HYDROCODONE-ACETAMINOPHEN 5-325 MG PO TABS: 1.0000 | ORAL_TABLET | ORAL | Status: DC | PRN

## 2015-09-28 MED ORDER — MAGNESIUM OXIDE -MG SUPPLEMENT 500 MG PO CAPS: 1.0000 | ORAL_CAPSULE | Freq: Every day | ORAL | Status: DC

## 2015-09-28 MED ORDER — PREGABALIN 25 MG PO CAPS: 25.0000 mg | ORAL_CAPSULE | Freq: Every day | ORAL | Status: DC

## 2015-09-28 MED ORDER — VITAMIN D3 50 MCG (2000 UT) PO CAPS: 2000.0000 [IU] | ORAL_CAPSULE | Freq: Every day | ORAL | Status: DC

## 2015-09-28 MED ORDER — PREGABALIN 100 MG PO CAPS: 100.0000 mg | ORAL_CAPSULE | Freq: Every day | ORAL | Status: DC

## 2015-09-28 MED ORDER — CARISOPRODOL 350 MG PO TABS: 350.0000 mg | ORAL_TABLET | Freq: Three times a day (TID) | ORAL | Status: DC

## 2015-09-28 NOTE — Assessment & Plan Note (Signed)
The patient has a known history of a C5-6 and C6-7 herniated cervical disks.

## 2015-09-28 NOTE — Patient Instructions (Signed)
Facet Blocks Patient Information  Description: The facets are joints in the spine between the vertebrae.  Like any joints in the body, facets can become irritated and painful.  Arthritis can also effect the facets.  By injecting steroids and local anesthetic in and around these joints, we can temporarily block the nerve supply to them.  Steroids act directly on irritated nerves and tissues to reduce selling and inflammation which often leads to decreased pain.  Facet blocks may be done anywhere along the spine from the neck to the low back depending upon the location of your pain.   After numbing the skin with local anesthetic (like Novocaine), a small needle is passed onto the facet joints under x-ray guidance.  You may experience a sensation of pressure while this is being done.  The entire block usually lasts about 15-25 minutes.   Conditions which may be treated by facet blocks:   Low back/buttock pain  Neck/shoulder pain  Certain types of headaches  Preparation for the injection:  1. Do not eat any solid food or dairy products within 6 hours of your appointment. 2. You may drink clear liquid up to 2 hours before appointment.  Clear liquids include water, black coffee, juice or soda.  No milk or cream please. 3. You may take your regular medication, including pain medications, with a sip of water before your appointment.  Diabetics should hold regular insulin (if taken separately) and take 1/2 normal NPH dose the morning of the procedure.  Carry some sugar containing items with you to your appointment. 4. A driver must accompany you and be prepared to drive you home after your procedure. 5. Bring all your current medications with you. 6. An IV may be inserted and sedation may be given at the discretion of the physician. 7. A blood pressure cuff, EKG and other monitors will often be applied during the procedure.  Some patients may need to have extra oxygen administered for a short  period. 8. You will be asked to provide medical information, including your allergies and medications, prior to the procedure.  We must know immediately if you are taking blood thinners (like Coumadin/Warfarin) or if you are allergic to IV iodine contrast (dye).  We must know if you could possible be pregnant.  Possible side-effects:   Bleeding from needle site  Infection (rare, may require surgery)  Nerve injury (rare)  Numbness & tingling (temporary)  Difficulty urinating (rare, temporary)  Spinal headache (a headache worse with upright posture)  Light-headedness (temporary)  Pain at injection site (serveral days)  Decreased blood pressure (rare, temporary)  Weakness in arm/leg (temporary)  Pressure sensation in back/neck (temporary)   Call if you experience:   Fever/chills associated with headache or increased back/neck pain  Headache worsened by an upright position  New onset, weakness or numbness of an extremity below the injection site  Hives or difficulty breathing (go to the emergency room)  Inflammation or drainage at the injection site(s)  Severe back/neck pain greater than usual  New symptoms which are concerning to you  Please note:  Although the local anesthetic injected can often make your back or neck feel good for several hours after the injection, the pain will likely return. It takes 3-7 days for steroids to work.  You may not notice any pain relief for at least one week.  If effective, we will often do a series of 2-3 injections spaced 3-6 weeks apart to maximally decrease your pain.  After the initial   series, you may be a candidate for a more permanent nerve block of the facets.  If you have any questions, please call #[phone]) [facility] Information  Description: The epidural space surrounds the nerves as they exit the spinal cord.  In some patients, the nerves can be  compressed and inflamed by a bulging disc or a tight spinal canal (spinal stenosis).  By injecting steroids into the epidural space, we can bring irritated nerves into direct contact with a potentially helpful medication.  These steroids act directly on the irritated nerves and can reduce swelling and inflammation which often leads to decreased pain.  Epidural steroids may be injected anywhere along the spine and from the neck to the low back depending upon the location of your pain.   After numbing the skin with local anesthetic (like Novocaine), a small needle is passed into the epidural space slowly.  You may experience a sensation of pressure while this is being done.  The entire block usually last less than 10 minutes.  Conditions which may be treated by epidural steroids:   Low back and leg pain  Neck and arm pain  Spinal stenosis  Post-laminectomy syndrome  Herpes zoster (shingles) pain  Pain from compression fractures  Preparation for the injection:  1. Do not eat any solid food or dairy products within 6 hours of your appointment.  2. You may drink clear liquids up to 2 hours before appointment.  Clear liquids include water, black coffee, juice or soda.  No milk or cream please. 3. You may take your regular medication, including pain medications, with a sip of water before your appointment  Diabetics should hold regular insulin (if taken separately) and take 1/2 normal NPH dos the morning of the procedure.  Carry some sugar containing items with you to your appointment. 4. A driver must accompany you and be prepared to drive you home after your procedure.  5. Bring all your current medications with your. 6. An IV may be inserted and sedation may be given at the discretion of the physician.   7. A blood pressure cuff, EKG and other monitors will often be applied during the procedure.  Some patients may need to have extra oxygen administered for a short period. 8. You will be asked  to provide medical information, including your allergies, prior to the procedure.  We must know immediately if you are taking blood thinners (like Coumadin/Warfarin)  Or if you are allergic to IV iodine contrast (dye). We must know if you could possible be pregnant.  Possible side-effects:  Bleeding from needle site  Infection (rare, may require surgery)  Nerve injury (rare)  Numbness & tingling (temporary)  Difficulty urinating (rare, temporary)  Spinal headache ( a headache worse with upright posture)  Light -headedness (temporary)  Pain at injection site (several days)  Decreased blood pressure (temporary)  Weakness in arm/leg (temporary)  Pressure sensation in back/neck (temporary)  Call if you experience:  Fever/chills associated with headache or increased back/neck pain.  Headache worsened by an upright position.  New onset weakness or numbness of an extremity below the injection site  Hives or difficulty breathing (go to the emergency room)  Inflammation or drainage at the infection site  Severe back/neck pain  Any new symptoms which are concerning to you  Please note:  Although the local anesthetic injected can often make your back or neck feel good for several hours after the injection, the pain  will likely return.  It takes 3-7 days for steroids to work in the epidural space.  You may not notice any pain relief for at least that one week.  If effective, we will often do a series of three injections spaced 3-6 weeks apart to maximally decrease your pain.  After the initial series, we generally will wait several months before considering a repeat injection of the same type.  If you have any questions, please call [phone] [facility]

## 2015-09-28 NOTE — Assessment & Plan Note (Signed)
This is likely to be due to a lumbar facet syndrome since the pain is primarily in the lower back. However, she does have a radicular component to it.

## 2015-09-28 NOTE — Progress Notes (Signed)
Patient here for medication refill.  States that she had a bout of stomach upset with N/V that lasted approximately 1 week and is now subsided.  Hydrocodone qty 82/180.  Last fill 09/08/15

## 2015-09-28 NOTE — Progress Notes (Signed)
Patient's Name: Zoe Sanchez MRN: [id number] DOB: 1956/08/08 DOS: 09/28/2015  Primary Reason(s) for Visit: Encounter for Medication Management CC: Neck Pain; Back Pain; and Fibromyalgia   HPI:  Zoe Sanchez is a 60 y.o. year old, female patient, who returns today as an established patient. She has Abnormal mammogram; Microcalcifications of the breast; Encounter for therapeutic drug level monitoring; Long term current use of opiate analgesic; Uncomplicated opioid dependence ([place]); Opiate use; Magnesium deficiency; Vitamin B12 deficiency; Myofascial pain syndrome; Cervical radicular pain; Cervical facet syndrome; Chronic neck pain (Location of Primary Source of Pain) (Bilateral) (R>L); Chronic pain syndrome; Cervical spondylosis; Anxiety; Awareness of heartbeats; Paroxysmal supraventricular tachycardia ([place]); Breath shortness; Syncope and collapse; Fibromyalgia; Cervical herniated disc; Foraminal stenosis of cervical region; Upper extremity pain; Pain in joint, shoulder region; Chronic radicular cervical pain; Chronic low back pain (secondary pain) (Bilateral) (R>L); Lumbar spondylosis; Lower extremity pain (Bilateral) (L>R); Coronary atherosclerosis of native coronary artery; Generalized anxiety disorder; Intermittent urinary incontinence; History of psychiatric disorder; Depression; Family history of chronic pain; History of attempted suicide; History of suicidal ideation; Irritable bowel syndrome; Intermittent diarrhea; Chronic constipation; Insomnia; History of hiatal hernia; Chronic pain; Long term prescription opiate use; and Vitamin D insufficiency on her problem list.. Her primarily concern today is the Neck Pain; Back Pain; and Fibromyalgia   The patient returns to the clinic today for pharmacological management of her chronic pain. Her primary source of pain is the neck region where she has 2 herniated disks with some foraminal stenosis. Her secondary pain is the lower back followed by the lower  extremities. Today we will go ahead and refill her medications and we will also give her some when necessary procedures for the neck problems, the lower back problems, and the lower extremity problems.  Reported Pain Score: 2  (last pain medicine at 1130) Reported level is compatible with observation Pain Type: Chronic pain Pain Location: Back Pain Orientation: Lower Pain Descriptors / Indicators: Other (Comment), Constant (electrical shock down into L leg) Pain Frequency: Constant  Date of Last Visit: 06/29/15 Service Provided on Last Visit: Med Refill  Pharmacotherapy  Review:   Onset of action: Within expected pharmacological parameters Time to Peak effect: Timing and results are as within normal expected parameters Effectiveness: Described as relatively effective, allowing for increase in activities of daily living (ADL) % Relief: More than 50% Side-effects or Adverse reactions: None reported Duration of action: Within normal limits for medication [place] PMP: Compliant with practice rules and regulations UDS Results: Compliant UDS Interpretation: Patient appears to be compliant with practice rules and regulations Medication Assessment Form: Reviewed. Patient indicates being compliant with therapy Treatment compliance: Compliant Substance Use Disorder (SUD) Risk Level: Low Pharmacologic Plan: Continue therapy as is  Lab Work: Illicit Drugs No results found for: THCU, COCAINSCRNUR, PCPSCRNUR, MDMA, AMPHETMU, METHADONE, ETOH  Inflammation Markers No results found for: ESRSEDRATE, CRP  Renal Function Lab Results  Component Value Date   BUN 16 06/14/2013   CREATININE 0.94 06/14/2013   GFRAA >60 06/14/2013   GFRNONAA >60 06/14/2013    Hepatic Function Lab Results  Component Value Date   AST 20 11/09/2012   ALT 22 11/09/2012   ALBUMIN 3.1* 11/09/2012    Electrolytes Lab Results  Component Value Date   NA 135* 06/14/2013   K 4.5 06/14/2013   CL 104 06/14/2013    CALCIUM 8.7 06/14/2013   MG 1.4* 06/14/2013    Allergies:  Zoe Sanchez is allergic to other.  Meds:  The patient has  a current medication list which includes the following prescription(s): amitriptyline, carisoprodol, vitamin d3, cyanocobalamin, estradiol, gemfibrozil, hydrocodone-acetaminophen, magnesium oxide, montelukast, omeprazole, pregabalin, pregabalin, premarin, propranolol, serevent diskus, zolpidem, hydrocodone-acetaminophen, and hydrocodone-acetaminophen.  ROS:  Constitutional: Afebrile, no chills, well hydrated and well nourished Gastrointestinal: negative Musculoskeletal:negative Neurological: negative Behavioral/Psych: negative  PFSH:  Medical:  Zoe Sanchez  has a past medical history of Asthma; Degenerative disk disease; Fibromyalgia; Irritable bowel syndrome; Endometriosis; Insomnia; Chronic pain; Anxiety; Depression; GERD (gastroesophageal reflux disease); Fibromyalgia; CAD (coronary artery disease); Insomnia; COPD (chronic obstructive pulmonary disease) ([place]); Hiatal hernia; Abnormal heart rhythm; Hyperlipidemia; Cancer ([place]); History of psychiatric disorder (06/29/2015); Family history of chronic pain (06/29/2015); History of attempted suicide (06/29/2015); History of suicidal ideation (06/29/2015); and History of hiatal hernia (06/29/2015). Family: family history includes Heart disease in her father; Hypertension in her father and mother. Surgical:  has past surgical history that includes Abdominal hysterectomy (1986); Tonsillectomy (1957); Skin lesion excision; and Breast surgery. Tobacco:  reports that she has never smoked. She does not have any smokeless tobacco history on file. Alcohol:  reports that she does not drink alcohol. Drug:  reports that she does not use illicit drugs.  Physical Exam:  Vitals:  Today's Vitals   09/28/15 1323  BP: 131/56  Pulse: 81  Temp: 98 F (36.7 C)  TempSrc: Oral  Resp: 16  Height: 5\' 2"  (1.575 m)  Weight: 150 lb (68.04 kg)   SpO2: 100%  PainSc: 2     Calculated BMI: Body mass index is 27.43 kg/(m^2).  General appearance: alert, cooperative, appears older than stated age and mild distress Eyes: PERLA Respiratory: No evidence respiratory distress, no audible rales or ronchi and no use of accessory muscles of respiration  Cervical Spine Inspection: Normal anatomy Alignment: Symetrical Palpation: WNL ROM: Decreased  Upper Extremities Inspection: No gross anomalies detected ROM: Adequate Sensory: Normal Motor: 5/5 Pulses: Palpable  Thoracic Spine Inspection: No gross anomalies detected Alignment: Symetrical Palpation: WNL ROM: Adequate  Lumbar Spine Inspection: No gross anomalies detected Alignment: Symetrical Palpation: WNL ROM: Decreased Provocative Tests: Lumbar Hyperextension and rotation test: Positive bilaterally Patrick's Maneuver: deferred Gait: Antalgic (limping)  Lower Extremities Inspection: No gross anomalies detected ROM: Adequate Sensory: Normal Motor: 5/5  Toe walk (S1): WNL  Heal walk (L5): WNL Pulses: Palpable  Assessment & Plan:  Primary Diagnosis & Pertinent Problem List: The primary encounter diagnosis was Cervical herniated disc. Diagnoses of Fibromyalgia, Chronic pain, Chronic neck pain (Location of Primary Source of Pain) (Bilateral) (R>L), Chronic low back pain (secondary pain) (Bilateral) (R>L), Chronic radicular cervical pain, Lower extremity pain (Bilateral) (L>R), Long term prescription opiate use, Encounter for therapeutic drug level monitoring, Long term current use of opiate analgesic, Chronic pain syndrome, Magnesium deficiency, Myofascial pain syndrome, Vitamin B12 deficiency, and Vitamin D insufficiency were also pertinent to this visit.  Assessment: Chronic neck pain (Location of Primary Source of Pain) (Bilateral) (R>L) The patient has a known history of a C5-6 and C6-7 herniated cervical disks.  Chronic low back pain (secondary pain) (Bilateral)  (R>L) This is likely to be due to a lumbar facet syndrome since the pain is primarily in the lower back. However, she does have a radicular component to it.   Pharmacotherapy Orders: Meds ordered this encounter  Medications  . Cholecalciferol (VITAMIN D3) 2000 units capsule    Sig: Take 1 capsule (2,000 Units total) by mouth daily.    Dispense:  30 capsule    Refill:  PRN    Do not place this medication,  or any other prescription from our practice, on "Automatic Refill".  . HYDROcodone-acetaminophen (NORCO/VICODIN) 5-325 MG tablet    Sig: Take 1 tablet by mouth every 4 (four) hours as needed for moderate pain or severe pain.    Dispense:  180 tablet    Refill:  0    Do not place this medication, or any other prescription from our practice, on "Automatic Refill". Patient may have prescription filled one day early if pharmacy is closed on scheduled refill date. Do not fill until: 10/04/15 To last until: 11/03/15  . HYDROcodone-acetaminophen (NORCO/VICODIN) 5-325 MG tablet    Sig: Take 1 tablet by mouth every 4 (four) hours as needed for moderate pain or severe pain.    Dispense:  180 tablet    Refill:  0    Do not place this medication, or any other prescription from our practice, on "Automatic Refill". Patient may have prescription filled one day early if pharmacy is closed on scheduled refill date. Do not fill until: 11/03/15 To last until: 11/30/15  . HYDROcodone-acetaminophen (NORCO/VICODIN) 5-325 MG tablet    Sig: Take 1 tablet by mouth every 4 (four) hours as needed for moderate pain or severe pain.    Dispense:  180 tablet    Refill:  0    Do not place this medication, or any other prescription from our practice, on "Automatic Refill". Patient may have prescription filled one day early if pharmacy is closed on scheduled refill date. Do not fill until: 11/30/15 To last until: 12/30/15  . pregabalin (LYRICA) 100 MG capsule    Sig: Take 1 capsule (100 mg total) by mouth at  bedtime.    Dispense:  30 capsule    Refill:  2    Do not place this medication, or any other prescription from our practice, on "Automatic Refill". Patient may have prescription filled one day early if pharmacy is closed on scheduled refill date.  . pregabalin (LYRICA) 25 MG capsule    Sig: Take 1 capsule (25 mg total) by mouth at bedtime.    Dispense:  30 capsule    Refill:  2    Do not place this medication, or any other prescription from our practice, on "Automatic Refill". Patient may have prescription filled one day early if pharmacy is closed on scheduled refill date.  . Magnesium Oxide 500 MG CAPS    Sig: Take 1 capsule (500 mg total) by mouth daily.    Dispense:  100 capsule    Refill:  PRN    Do not place this medication, or any other prescription from our practice, on "Automatic Refill". Patient may have prescription filled one day early if pharmacy is closed on scheduled refill date.  . carisoprodol (SOMA) 350 MG tablet    Sig: Take 1 tablet (350 mg total) by mouth 3 (three) times daily.    Dispense:  90 tablet    Refill:  2    Do not place this medication, or any other prescription from our practice, on "Automatic Refill". Patient may have prescription filled one day early if pharmacy is closed on scheduled refill date. Do not fill until: 07/07/15 To last until: 10/05/15  . cyanocobalamin ([employer] VITAMIN B12) 2000 MCG tablet    Sig: Take 1 tablet (2,000 mcg total) by mouth daily.    Dispense:  30 tablet    Refill:  PRN    Do not place this medication, or any other prescription from our practice, on "Automatic Refill".  Lab-work & Procedure Orders: No orders of the defined types were placed in this encounter.    Radiology Orders: None  Interventional Therapies: PRN procedure: 1. For the neck pain and upper extremity pain we will schedule her to have a cervical epidural steroid injection under fluoroscopic guidance, no sedation.  2. For her low back pain we will have  the option of doing bilateral lumbar facet blocks under fluoroscopic guidance.  3. For the lower extremity pain, we will do lumbar epidural steroid injections under fluoroscopic guidance.    Administered Medications: Ms. Cooling had no medications administered during this visit.  Primary Care Physician: Baltazar Apo, MD Location: [facility] Note by: Kathlen Brunswick. Dossie Arbour, M.D, DABA, DABAPM, DABPM, DABIPP, FIPP

## 2015-10-03 LAB — TOXASSURE SELECT 13 (MW), URINE: PDF: 0

## 2015-10-29 ENCOUNTER — Other Ambulatory Visit: Payer: Self-pay | Admitting: Family Medicine

## 2015-10-29 DIAGNOSIS — R928 Other abnormal and inconclusive findings on diagnostic imaging of breast: Secondary | ICD-10-CM

## 2015-10-29 DIAGNOSIS — E559 Vitamin D deficiency, unspecified: Secondary | ICD-10-CM

## 2015-10-29 DIAGNOSIS — Z79890 Hormone replacement therapy (postmenopausal): Secondary | ICD-10-CM

## 2015-10-30 ENCOUNTER — Other Ambulatory Visit: Payer: Self-pay | Admitting: Family Medicine

## 2015-10-30 DIAGNOSIS — R921 Mammographic calcification found on diagnostic imaging of breast: Secondary | ICD-10-CM

## 2015-11-16 ENCOUNTER — Other Ambulatory Visit: Payer: Medicare Other

## 2015-11-16 ENCOUNTER — Ambulatory Visit: Payer: Medicare Other

## 2015-11-26 ENCOUNTER — Other Ambulatory Visit: Payer: Medicare Other

## 2015-11-26 ENCOUNTER — Ambulatory Visit: Payer: Medicare Other | Attending: Family Medicine

## 2015-12-23 ENCOUNTER — Ambulatory Visit: Payer: Medicare Other | Attending: Pain Medicine | Admitting: Pain Medicine

## 2015-12-23 ENCOUNTER — Encounter: Payer: Self-pay | Admitting: Pain Medicine

## 2015-12-23 VITALS — BP 142/66 | HR 71 | Temp 98.2°F | Resp 16 | Ht 63.0 in | Wt 150.0 lb

## 2015-12-23 DIAGNOSIS — M542 Cervicalgia: Secondary | ICD-10-CM | POA: Diagnosis present

## 2015-12-23 DIAGNOSIS — K5903 Drug induced constipation: Secondary | ICD-10-CM | POA: Insufficient documentation

## 2015-12-23 DIAGNOSIS — M7918 Myalgia, other site: Secondary | ICD-10-CM

## 2015-12-23 DIAGNOSIS — M5020 Other cervical disc displacement, unspecified cervical region: Secondary | ICD-10-CM

## 2015-12-23 DIAGNOSIS — G4700 Insomnia, unspecified: Secondary | ICD-10-CM | POA: Insufficient documentation

## 2015-12-23 DIAGNOSIS — I2510 Atherosclerotic heart disease of native coronary artery without angina pectoris: Secondary | ICD-10-CM | POA: Insufficient documentation

## 2015-12-23 DIAGNOSIS — I471 Supraventricular tachycardia: Secondary | ICD-10-CM | POA: Insufficient documentation

## 2015-12-23 DIAGNOSIS — F329 Major depressive disorder, single episode, unspecified: Secondary | ICD-10-CM | POA: Insufficient documentation

## 2015-12-23 DIAGNOSIS — M797 Fibromyalgia: Secondary | ICD-10-CM

## 2015-12-23 DIAGNOSIS — E538 Deficiency of other specified B group vitamins: Secondary | ICD-10-CM

## 2015-12-23 DIAGNOSIS — F40240 Claustrophobia: Secondary | ICD-10-CM | POA: Diagnosis not present

## 2015-12-23 DIAGNOSIS — E559 Vitamin D deficiency, unspecified: Secondary | ICD-10-CM | POA: Diagnosis not present

## 2015-12-23 DIAGNOSIS — R920 Mammographic microcalcification found on diagnostic imaging of breast: Secondary | ICD-10-CM | POA: Diagnosis not present

## 2015-12-23 DIAGNOSIS — M791 Myalgia: Secondary | ICD-10-CM

## 2015-12-23 DIAGNOSIS — R928 Other abnormal and inconclusive findings on diagnostic imaging of breast: Secondary | ICD-10-CM | POA: Diagnosis not present

## 2015-12-23 DIAGNOSIS — M79601 Pain in right arm: Secondary | ICD-10-CM

## 2015-12-23 DIAGNOSIS — M79602 Pain in left arm: Secondary | ICD-10-CM

## 2015-12-23 DIAGNOSIS — Z79891 Long term (current) use of opiate analgesic: Secondary | ICD-10-CM

## 2015-12-23 DIAGNOSIS — M50222 Other cervical disc displacement at C5-C6 level: Secondary | ICD-10-CM | POA: Diagnosis not present

## 2015-12-23 DIAGNOSIS — F411 Generalized anxiety disorder: Secondary | ICD-10-CM | POA: Insufficient documentation

## 2015-12-23 DIAGNOSIS — T402X5A Adverse effect of other opioids, initial encounter: Secondary | ICD-10-CM

## 2015-12-23 DIAGNOSIS — G5602 Carpal tunnel syndrome, left upper limb: Secondary | ICD-10-CM | POA: Diagnosis not present

## 2015-12-23 DIAGNOSIS — R55 Syncope and collapse: Secondary | ICD-10-CM | POA: Diagnosis not present

## 2015-12-23 DIAGNOSIS — F1190 Opioid use, unspecified, uncomplicated: Secondary | ICD-10-CM

## 2015-12-23 DIAGNOSIS — M4802 Spinal stenosis, cervical region: Secondary | ICD-10-CM | POA: Diagnosis not present

## 2015-12-23 DIAGNOSIS — G8929 Other chronic pain: Secondary | ICD-10-CM

## 2015-12-23 DIAGNOSIS — M25511 Pain in right shoulder: Secondary | ICD-10-CM

## 2015-12-23 DIAGNOSIS — Z5181 Encounter for therapeutic drug level monitoring: Secondary | ICD-10-CM | POA: Diagnosis not present

## 2015-12-23 DIAGNOSIS — K589 Irritable bowel syndrome without diarrhea: Secondary | ICD-10-CM | POA: Insufficient documentation

## 2015-12-23 DIAGNOSIS — E612 Magnesium deficiency: Secondary | ICD-10-CM | POA: Diagnosis not present

## 2015-12-23 DIAGNOSIS — M25512 Pain in left shoulder: Secondary | ICD-10-CM | POA: Diagnosis not present

## 2015-12-23 DIAGNOSIS — M549 Dorsalgia, unspecified: Secondary | ICD-10-CM | POA: Diagnosis present

## 2015-12-23 DIAGNOSIS — M5412 Radiculopathy, cervical region: Secondary | ICD-10-CM

## 2015-12-23 DIAGNOSIS — F419 Anxiety disorder, unspecified: Secondary | ICD-10-CM

## 2015-12-23 MED ORDER — CARISOPRODOL 350 MG PO TABS: 350.0000 mg | ORAL_TABLET | Freq: Three times a day (TID) | ORAL | Status: DC

## 2015-12-23 MED ORDER — HYDROCODONE-ACETAMINOPHEN 5-325 MG PO TABS: 1.0000 | ORAL_TABLET | ORAL | Status: DC | PRN

## 2015-12-23 MED ORDER — MAGNESIUM OXIDE -MG SUPPLEMENT 500 MG PO CAPS: 1.0000 | ORAL_CAPSULE | Freq: Every day | ORAL | Status: DC

## 2015-12-23 MED ORDER — DIAZEPAM 5 MG PO TABS: ORAL_TABLET | ORAL | Status: DC

## 2015-12-23 MED ORDER — VITAMIN D3 50 MCG (2000 UT) PO CAPS: 2000.0000 [IU] | ORAL_CAPSULE | Freq: Every day | ORAL | Status: DC

## 2015-12-23 MED ORDER — PREGABALIN 100 MG PO CAPS: 100.0000 mg | ORAL_CAPSULE | Freq: Every day | ORAL | Status: DC

## 2015-12-23 MED ORDER — PREGABALIN 25 MG PO CAPS: 25.0000 mg | ORAL_CAPSULE | Freq: Every day | ORAL | Status: DC

## 2015-12-23 MED ORDER — CYANOCOBALAMIN 2000 MCG PO TABS: 2000.0000 ug | ORAL_TABLET | Freq: Every day | ORAL | Status: DC

## 2015-12-23 NOTE — Progress Notes (Signed)
Patient's Name: Zoe Sanchez  Patient type: Established  MRN: [id number]  Service setting: Ambulatory outpatient  DOB: Sep 08, 1956  Location: [facility]  DOS: 12/23/2015  Primary Care Physician: Baltazar Apo, MD  Note by: Kathlen Brunswick. Dossie Arbour, M.D, DABA, DABAPM, DABPM, Milagros Evener, FIPP  Referring Physician: Denton Lank, MD  Specialty: Board-Certified Interventional Pain Management     Primary Reason(s) for Visit: Encounter for prescription drug management (Level of risk: moderate) CC: Back Pain and Neck Pain   HPI  Zoe Sanchez is a 60 y.o. year old, female patient, who returns today as an established patient. She has Abnormal mammogram; Microcalcifications of the breast; Encounter for therapeutic drug level monitoring; Long term current use of opiate analgesic; Opiate use (30 MME/Day); Magnesium deficiency; Vitamin B12 deficiency; Myofascial pain syndrome; Cervical facet syndrome (Location of Primary Source of Pain) (Bilateral) (R>L); Chronic neck pain (Location of Primary Source of Pain) (Bilateral) (R>L); Chronic pain syndrome; Cervical spondylosis; Anxiety; Awareness of heartbeats; Paroxysmal supraventricular tachycardia ([place]); Breath shortness; Syncope and collapse; Fibromyalgia; Cervical herniated disc (C5-6 and C6-7); Cervical foraminal stenosis (Bilateral) (C5-6); Chronic cervical radicular pain (Location of Secondary source of pain) (Bilateral) (R>L) (C8 Dermatome); Chronic low back pain (Location of Tertiary source of pain) (Bilateral) (R>L); Lumbar spondylosis; Lower extremity pain (Bilateral) (L>R); Coronary atherosclerosis of native coronary artery; Generalized anxiety disorder; Intermittent urinary incontinence; History of psychiatric disorder; Depression; Family history of chronic pain; History of attempted suicide; History of suicidal ideation; Irritable bowel syndrome; Intermittent diarrhea; Chronic constipation; Insomnia; History of hiatal hernia; Chronic pain;  Long term prescription opiate use; Vitamin D insufficiency; Opioid-induced constipation (OIC); Carpal tunnel syndrome (Left); Claustrophobia; Chronic upper extremity pain (Location of Secondary source of pain) (Bilateral) (R>L); and Chronic shoulder pain (Bilateral) on her problem list.. Her primarily concern today is the Back Pain and Neck Pain   Pain Assessment: Self-Reported Pain Score: 2  Reported level is compatible with observation Pain Type: Chronic pain Pain Location: Back Pain Orientation: Lower Pain Frequency: Constant  The patient comes in today clinics today for pharmacological management of her chronic pain. The patient indicates worsening of her upper extremity pain and weakness. Today's physical exam demonstrated that she has a left carpal tunnel syndrome as shown by a positive Phalen's test and Tinel's sign at the level of the wrist. To differentiate between the couple Tylenol and a possible radiculopathy we will be ordering an EMG/PNCV of the upper extremities. In addition since the patient indicates that her cervical pain has been worsening, we will go ahead and reorder an MRI of the cervical spine. We have that the last MRI that she had done was on 02/09/2012.  Date of Last Visit: 09/28/15 Service Provided on Last Visit: Med Refill  Controlled Substance Pharmacotherapy Assessment  Analgesic: Hydrocodone/APAP 5/325 one cue 4 hours (6 per day) (30 mg/day) Pill Count: 67 out of 180 Norco 5/325 remaining. Filled 11-30-15 MME/day: 30 mg/day.  Pharmacokinetics: Onset of action (Liberation/Absorption): Within expected pharmacological parameters Time to Peak effect (Distribution): Timing and results are as within normal expected parameters Duration of action (Metabolism/Excretion): Within normal limits for medication Pharmacodynamics: Analgesic Effect: More than 50% Activity Facilitation: Medication(s) allow patient to sit, stand, walk, and do the basic ADLs Perceived  Effectiveness: Described as relatively effective, allowing for increase in activities of daily living (ADL) Side-effects or Adverse reactions: None reported Monitoring: [place] PMP: Online review of the past 67-month period conducted. Compliant with practice rules and regulations UDS Results/interpretation: The patient's last UDS was  done on 09/28/2015 8 came back within normal limits with no unexpected results. Medication Assessment Form: Reviewed. Patient indicates being compliant with therapy Treatment compliance: Compliant Risk Assessment: Aberrant Behavior: None observed today Substance Use Disorder (SUD) Risk Level: Low Risk of opioid abuse or dependence: 0.7-3.0% with doses ? 36 MME/day and 6.1-26% with doses ? 120 MME/day. Opioid Risk Tool (ORT) Score: Total Score: 3 Low Risk for SUD (Score <3) Depression Scale Score: PHQ-2: PHQ-2 Total Score: 0 No depression (0) PHQ-9: PHQ-9 Total Score: 0 No depression (0-4)  Pharmacologic Plan: No change in therapy, at this time  Laboratory Chemistry  Inflammation Markers Lab Results  Component Value Date   ESRSEDRATE 26 09/28/2015    Renal Function Lab Results  Component Value Date   BUN 11 09/28/2015   CREATININE 0.70 09/28/2015   GFRAA >60 09/28/2015   GFRNONAA >60 09/28/2015    Hepatic Function Lab Results  Component Value Date   AST 19 09/28/2015   ALT 22 09/28/2015   ALBUMIN 3.5 09/28/2015    Electrolytes Lab Results  Component Value Date   NA 137 09/28/2015   K 4.2 09/28/2015   CL 106 09/28/2015   CALCIUM 8.9 09/28/2015   MG 1.8 09/28/2015    Pain Modulating Vitamins No results found for: VD25OH, VD125OH2TOT, H157544, [id number], VITAMINB12  Coagulation Parameters Lab Results  Component Value Date   INR 0.9 03/06/2012   LABPROT 12.8 03/06/2012    Note: I personally reviewed the above data. Results shared with patient.  Meds  The patient has a current medication list which includes the following  prescription(s): amitriptyline, carisoprodol, vitamin d3, cyanocobalamin, estradiol, gemfibrozil, hydrocodone-acetaminophen, hydrocodone-acetaminophen, hydrocodone-acetaminophen, magnesium oxide, montelukast, omeprazole, pregabalin, pregabalin, propranolol, serevent diskus, zolpidem, and diazepam.  Current Outpatient Prescriptions on File Prior to Visit  Medication Sig  . amitriptyline (ELAVIL) 100 MG tablet 200 mg at bedtime.   Marland Kitchen estradiol (ESTRACE) 2 MG tablet Take 2 mg by mouth daily.  Marland Kitchen gemfibrozil (LOPID) 600 MG tablet Take by mouth 2 (two) times daily before a meal.   . montelukast (SINGULAIR) 10 MG tablet Take by mouth at bedtime.   Marland Kitchen omeprazole (PRILOSEC) 20 MG capsule Take 20 mg by mouth 2 (two) times daily before a meal.   . propranolol (INDERAL) 40 MG tablet Take 40 mg by mouth 4 (four) times daily. Takes 5 tabs five times per day.  . SEREVENT DISKUS 50 MCG/DOSE diskus inhaler 2 puffs 1 day or 1 dose.   . zolpidem (AMBIEN) 10 MG tablet 10 mg at bedtime.    No current facility-administered medications on file prior to visit.    ROS  Constitutional: Afebrile, no chills, well hydrated and well nourished Gastrointestinal: No upper or lower GI bleeding, no nausea, no vomiting and no acute GI distress Musculoskeletal: No acute joint swelling or redness, no acute loss of range of motion and no acute onset weakness Neurological: Denies any acute onset apraxia, no episodes of paralysis, no acute loss of coordination, no acute loss of consciousness and no acute onset aphasia, dysarthria, agnosia, or amnesia  Allergies  Zoe Sanchez is allergic to other.  [place]  Medical:  Zoe Sanchez  has a past medical history of Asthma; Degenerative disk disease; Fibromyalgia; Irritable bowel syndrome; Endometriosis; Insomnia; Chronic pain; Anxiety; Depression; GERD (gastroesophageal reflux disease); Fibromyalgia; CAD (coronary artery disease); Insomnia; COPD (chronic obstructive pulmonary disease) ([place]);  Hiatal hernia; Abnormal heart rhythm; Hyperlipidemia; Cancer ([place]); History of psychiatric disorder (06/29/2015); Family history of chronic pain (06/29/2015); History of  attempted suicide (06/29/2015); History of suicidal ideation (06/29/2015); History of hiatal hernia (06/29/2015); and Cervical radicular pain (Location of Secondary source of pain) (Bilateral) (R>L) (C8 Dermatome) (06/29/2015). Family: family history includes Heart disease in her father; Hypertension in her father and mother. Surgical:  has past surgical history that includes Abdominal hysterectomy (1986); Tonsillectomy (1957); Skin lesion excision; and Breast surgery. Tobacco:  reports that she has never smoked. She does not have any smokeless tobacco history on file. Alcohol:  reports that she does not drink alcohol. Drug:  reports that she does not use illicit drugs.  Physical Examination  Constitutional Vitals:  Today's Vitals   12/23/15 1409 12/23/15 1459  BP: 142/66   Pulse: 71   Temp: 98.2 F (36.8 C)   TempSrc: Oral   Resp: 16   Height: 5\' 3"  (1.6 m)   Weight: 150 lb (68.04 kg)   SpO2: 98%   PainSc:  2    Calculated BMI: Body mass index is 26.58 kg/(m^2).    General appearance: alert, cooperative, appears stated age, mild distress and mildly obese Eyes: PERLA Respiratory: No evidence respiratory distress, no audible rales or ronchi and no use of accessory muscles of respiration  Cervical Spine Exam  Inspection: Normal anatomy, no anomalies observed Cervical Lordosis: Normal Alignment: Symetrical Functional ROM: Within functional limits (WFL) AROM: Decreased Sensory: No sensory abnormalities reported  Upper Extremity Exam    Right  Left  Inspection: No gross anomalies detected  Inspection: No gross anomalies detected  Functional ROM: Adequate  Functional ROM: Adequate  AROM: Adequate  AROM: Adequate  Sensory: Normal  No sensory abnormalities reported  Sensory: Normal  No sensory abnormalities reported   Motor: Unremarkable  Motor: Unremarkable  Vascular: Normal skin color, temperature, and hair growth. No peripheral edema or cyanosis  Vascular: Normal skin color, temperature, and hair growth. No peripheral edema or cyanosis   Thoracic Spine  Inspection: No gross anomalies detected Alignment: Symetrical Functional ROM: Within functional limits [facility]) AROM: Adequate Palpation: WNL  Lumbar Spine  Inspection: No gross anomalies detected Alignment: Symetrical Functional ROM: Within functional limits [facility]) AROM: Adequate Palpation: WNL Provocative Tests: Lumbar Hyperextension and rotation test: deferred Patrick's Maneuver: deferred  Gait Assessment  Gait: WNL  Lower Extremities    Right  Left  Inspection: No gross anomalies detected  Inspection: No gross anomalies detected  Functional ROM: Within functional limits [facility])  Functional ROM: Within functional limits (WFL)  AROM: Adequate  AROM: Adequate  Sensory: Normal  Sensory: Normal  Motor: Unremarkable  Motor: Unremarkable   Assessment & Plan  Primary Diagnosis & Pertinent Problem List: The primary encounter diagnosis was Chronic pain. Diagnoses of Encounter for therapeutic drug level monitoring, Long term current use of opiate analgesic, Opioid-induced constipation (OIC), Opiate use (30 MME/Day), Chronic neck pain (Location of Primary Source of Pain) (Bilateral) (R>L), Fibromyalgia, Myofascial pain syndrome, Magnesium deficiency, Vitamin B12 deficiency, Vitamin D insufficiency, Chronic radicular cervical pain, Carpal tunnel syndrome of left wrist, Anxiety, Claustrophobia, Chronic pain of both upper extremities, Cervical herniated disc (C5-6 and C6-7), Cervical foraminal stenosis (Bilateral) (C5-6), and Chronic shoulder pain (Bilateral) were also pertinent to this visit.  Visit Diagnosis: 1. Chronic pain   2. Encounter for therapeutic drug level monitoring   3. Long term current use of opiate analgesic   4. Opioid-induced  constipation (OIC)   5. Opiate use (30 MME/Day)   6. Chronic neck pain (Location of Primary Source of Pain) (Bilateral) (R>L)   7. Fibromyalgia   8. Myofascial pain  syndrome   9. Magnesium deficiency   10. Vitamin B12 deficiency   11. Vitamin D insufficiency   12. Chronic radicular cervical pain   13. Carpal tunnel syndrome of left wrist   14. Anxiety   15. Claustrophobia   16. Chronic pain of both upper extremities   17. Cervical herniated disc (C5-6 and C6-7)   18. Cervical foraminal stenosis (Bilateral) (C5-6)   19. Chronic shoulder pain (Bilateral)     Problem-specific Plan(s): No problem-specific assessment & plan notes found for this encounter.   Plan of Care   Problem List Items Addressed This Visit      High   Carpal tunnel syndrome (Left) (Chronic)   Relevant Medications   pregabalin (LYRICA) 100 MG capsule   pregabalin (LYRICA) 25 MG capsule   diazepam (VALIUM) 5 MG tablet   carisoprodol (SOMA) 350 MG tablet   Other Relevant Orders   NCV with EMG(electromyography)   Cervical foraminal stenosis (Bilateral) (C5-6) (Chronic)   Cervical herniated disc (C5-6 and C6-7) (Chronic)   Chronic cervical radicular pain (Location of Secondary source of pain) (Bilateral) (R>L) (C8 Dermatome) (Chronic)   Relevant Medications   pregabalin (LYRICA) 100 MG capsule   pregabalin (LYRICA) 25 MG capsule   diazepam (VALIUM) 5 MG tablet   carisoprodol (SOMA) 350 MG tablet   Other Relevant Orders   NCV with EMG(electromyography)   MR Cervical Spine Wo Contrast   Chronic neck pain (Location of Primary Source of Pain) (Bilateral) (R>L) (Chronic)   Relevant Medications   pregabalin (LYRICA) 100 MG capsule   pregabalin (LYRICA) 25 MG capsule   carisoprodol (SOMA) 350 MG tablet   HYDROcodone-acetaminophen (NORCO/VICODIN) 5-325 MG tablet   HYDROcodone-acetaminophen (NORCO/VICODIN) 5-325 MG tablet   HYDROcodone-acetaminophen (NORCO/VICODIN) 5-325 MG tablet   Chronic pain - Primary  (Chronic)   Relevant Medications   pregabalin (LYRICA) 100 MG capsule   pregabalin (LYRICA) 25 MG capsule   carisoprodol (SOMA) 350 MG tablet   HYDROcodone-acetaminophen (NORCO/VICODIN) 5-325 MG tablet   HYDROcodone-acetaminophen (NORCO/VICODIN) 5-325 MG tablet   HYDROcodone-acetaminophen (NORCO/VICODIN) 5-325 MG tablet   Chronic shoulder pain (Bilateral) (Chronic)   Chronic upper extremity pain (Location of Secondary source of pain) (Bilateral) (R>L) (Chronic)   Relevant Medications   pregabalin (LYRICA) 100 MG capsule   pregabalin (LYRICA) 25 MG capsule   carisoprodol (SOMA) 350 MG tablet   HYDROcodone-acetaminophen (NORCO/VICODIN) 5-325 MG tablet   HYDROcodone-acetaminophen (NORCO/VICODIN) 5-325 MG tablet   HYDROcodone-acetaminophen (NORCO/VICODIN) 5-325 MG tablet   Fibromyalgia (Chronic)   Relevant Medications   pregabalin (LYRICA) 100 MG capsule   pregabalin (LYRICA) 25 MG capsule   Myofascial pain syndrome (Chronic)   Relevant Medications   carisoprodol (SOMA) 350 MG tablet     Medium   Encounter for therapeutic drug level monitoring   Long term current use of opiate analgesic (Chronic)   Opiate use (30 MME/Day) (Chronic)   Opioid-induced constipation (OIC) (Chronic)     Low   Anxiety   Relevant Medications   diazepam (VALIUM) 5 MG tablet   Claustrophobia   Relevant Medications   diazepam (VALIUM) 5 MG tablet   Magnesium deficiency   Relevant Medications   Magnesium Oxide 500 MG CAPS   Vitamin B12 deficiency   Relevant Medications   cyanocobalamin ([facility] VITAMIN B12) 2000 MCG tablet   Vitamin D insufficiency   Relevant Medications   Cholecalciferol (VITAMIN D3) 2000 units capsule       Pharmacotherapy (Medications Ordered): Meds ordered this encounter  Medications  . DISCONTD:  HYDROcodone-acetaminophen (NORCO/VICODIN) 5-325 MG tablet    Sig: Take 1 tablet by mouth every 4 (four) hours as needed for moderate pain or severe pain.    Dispense:  180 tablet     Refill:  0    Do not place this medication, or any other prescription from our practice, on "Automatic Refill". Patient may have prescription filled one day early if pharmacy is closed on scheduled refill date. Do not fill until: 12/30/15 To last until: 01/29/16  . DISCONTD: HYDROcodone-acetaminophen (NORCO/VICODIN) 5-325 MG tablet    Sig: Take 1 tablet by mouth every 4 (four) hours as needed for moderate pain or severe pain.    Dispense:  180 tablet    Refill:  0    Do not place this medication, or any other prescription from our practice, on "Automatic Refill". Patient may have prescription filled one day early if pharmacy is closed on scheduled refill date. Do not fill until: 01/29/16 To last until: 02/28/16  . DISCONTD: HYDROcodone-acetaminophen (NORCO/VICODIN) 5-325 MG tablet    Sig: Take 1 tablet by mouth every 4 (four) hours as needed for moderate pain or severe pain.    Dispense:  180 tablet    Refill:  0    Do not place this medication, or any other prescription from our practice, on "Automatic Refill". Patient may have prescription filled one day early if pharmacy is closed on scheduled refill date. Do not fill until: 02/28/16 To last until: 03/29/16  . pregabalin (LYRICA) 100 MG capsule    Sig: Take 1 capsule (100 mg total) by mouth at bedtime.    Dispense:  30 capsule    Refill:  2    Do not place this medication, or any other prescription from our practice, on "Automatic Refill". Patient may have prescription filled one day early if pharmacy is closed on scheduled refill date.  . pregabalin (LYRICA) 25 MG capsule    Sig: Take 1 capsule (25 mg total) by mouth at bedtime.    Dispense:  30 capsule    Refill:  2    Do not place this medication, or any other prescription from our practice, on "Automatic Refill". Patient may have prescription filled one day early if pharmacy is closed on scheduled refill date.  Marland Kitchen DISCONTD: carisoprodol (SOMA) 350 MG tablet    Sig: Take 1 tablet  (350 mg total) by mouth 3 (three) times daily.    Dispense:  90 tablet    Refill:  2    Do not place this medication, or any other prescription from our practice, on "Automatic Refill". Patient may have prescription filled one day early if pharmacy is closed on scheduled refill date. Do not fill until: 07/07/15 To last until: 10/05/15  . Magnesium Oxide 500 MG CAPS    Sig: Take 1 capsule (500 mg total) by mouth daily.    Dispense:  100 capsule    Refill:  PRN    Do not place this medication, or any other prescription from our practice, on "Automatic Refill". Patient may have prescription filled one day early if pharmacy is closed on scheduled refill date.  . cyanocobalamin ([facility] VITAMIN B12) 2000 MCG tablet    Sig: Take 1 tablet (2,000 mcg total) by mouth daily.    Dispense:  30 tablet    Refill:  PRN    Do not place this medication, or any other prescription from our practice, on "Automatic Refill".  . Cholecalciferol (VITAMIN D3) 2000 units capsule  Sig: Take 1 capsule (2,000 Units total) by mouth daily.    Dispense:  30 capsule    Refill:  PRN    Do not place this medication, or any other prescription from our practice, on "Automatic Refill".  . diazepam (VALIUM) 5 MG tablet    Sig: Take 1 pill 30-45 minutes before MRI. Take a second pill before going in, if still anxious.    Dispense:  2 tablet    Refill:  0    For claustrophobia during MRI.  . carisoprodol (SOMA) 350 MG tablet    Sig: Take 1 tablet (350 mg total) by mouth 3 (three) times daily.    Dispense:  90 tablet    Refill:  2    Do not place this medication, or any other prescription from our practice, on "Automatic Refill". Patient may have prescription filled one day early if pharmacy is closed on scheduled refill date.  Marland Kitchen HYDROcodone-acetaminophen (NORCO/VICODIN) 5-325 MG tablet    Sig: Take 1 tablet by mouth every 4 (four) hours as needed for moderate pain or severe pain.    Dispense:  180 tablet    Refill:  0    Do  not place this medication, or any other prescription from our practice, on "Automatic Refill". Patient may have prescription filled one day early if pharmacy is closed on scheduled refill date. Do not fill until: 12/30/15 To last until: 01/29/16  . HYDROcodone-acetaminophen (NORCO/VICODIN) 5-325 MG tablet    Sig: Take 1 tablet by mouth every 4 (four) hours as needed for moderate pain or severe pain.    Dispense:  180 tablet    Refill:  0    Do not place this medication, or any other prescription from our practice, on "Automatic Refill". Patient may have prescription filled one day early if pharmacy is closed on scheduled refill date. Do not fill until: 01/29/16 To last until: 02/28/16  . HYDROcodone-acetaminophen (NORCO/VICODIN) 5-325 MG tablet    Sig: Take 1 tablet by mouth every 4 (four) hours as needed for moderate pain or severe pain.    Dispense:  180 tablet    Refill:  0    Do not place this medication, or any other prescription from our practice, on "Automatic Refill". Patient may have prescription filled one day early if pharmacy is closed on scheduled refill date. Do not fill until: 02/28/16 To last until: 03/29/16   [facility] & Procedure Ordered: Orders Placed This Encounter  Procedures  . MR Cervical Spine Wo Contrast  . NCV with EMG(electromyography)    Imaging Ordered: MR CERVICAL SPINE WO CONTRAST  Interventional Therapies: Scheduled:  None at this time.    Considering:  Diagnostic right cervical epidural steroid injection under fluoroscopic guidance and IV sedation versus diagnostic bilateral cervical facet block under fluoroscopic guidance and IV sedation.    PRN Procedures:  None at this time.    Referral(s) or Consult(s): None at this time.  New Prescriptions   DIAZEPAM (VALIUM) 5 MG TABLET    Take 1 pill 30-45 minutes before MRI. Take a second pill before going in, if still anxious.    Medications administered during this visit: Zoe Sanchez had no  medications administered during this visit.  Future Appointments Date Time Provider [place]  12/31/2015 1:40 PM [facility]  12/31/2015 2:20 PM [facility]NOSTIC [facility]  12/31/2015 2:40 PM [facility] [place] [facility]  12/31/2015 2:50 PM [facility] [place] [facility]  03/23/2016 1:20 PM  Milinda Pointer, MD [facility] None    Primary Care Physician: Baltazar Apo, MD Location: [facility] Note by: Kathlen Brunswick Dossie Arbour, M.D, DABA, DABAPM, DABPM, DABIPP, FIPP  Pain Score Disclaimer: We use the NRS-11 scale. This is a self-reported, subjective measurement of pain severity with only modest accuracy. It is used primarily to identify changes within a particular patient. It must be understood that outpatient pain scales are significantly less accurate that those used for research, where they can be applied under ideal controlled circumstances with minimal exposure to variables. In reality, the score is likely to be a combination of pain intensity and pain affect, where pain affect describes the degree of emotional arousal or changes in action readiness caused by the sensory experience of pain. Factors such as social and work situation, setting, emotional state, anxiety levels, expectation, and prior pain experience may influence pain perception and show large inter-individual differences that may also be affected by time variables.

## 2015-12-23 NOTE — Progress Notes (Signed)
#  67 out of 180 Norco 5/325 remaining.  filled 11-30-15

## 2015-12-23 NOTE — Progress Notes (Signed)
Safety precautions to be maintained throughout the outpatient stay will include: orient to surroundings, keep bed in low position, maintain call bell within reach at all times, provide assistance with transfer out of bed and ambulation.  

## 2015-12-24 ENCOUNTER — Ambulatory Visit: Payer: Medicare Other

## 2015-12-24 ENCOUNTER — Encounter: Payer: Self-pay | Admitting: Pain Medicine

## 2015-12-24 DIAGNOSIS — M25519 Pain in unspecified shoulder: Secondary | ICD-10-CM | POA: Insufficient documentation

## 2015-12-24 DIAGNOSIS — M25512 Pain in left shoulder: Secondary | ICD-10-CM

## 2015-12-24 DIAGNOSIS — M25511 Pain in right shoulder: Secondary | ICD-10-CM

## 2015-12-24 DIAGNOSIS — G8929 Other chronic pain: Secondary | ICD-10-CM | POA: Insufficient documentation

## 2015-12-31 ENCOUNTER — Ambulatory Visit
Admission: RE | Admit: 2015-12-31 | Discharge: 2015-12-31 | Disposition: A | Discharge status: Home / Self Care | Payer: Medicare Other | Source: Ambulatory Visit | Attending: Family Medicine | Admitting: Family Medicine

## 2015-12-31 DIAGNOSIS — Z13820 Encounter for screening for osteoporosis: Secondary | ICD-10-CM | POA: Diagnosis present

## 2015-12-31 DIAGNOSIS — N63 Unspecified lump in breast: Secondary | ICD-10-CM | POA: Diagnosis not present

## 2015-12-31 DIAGNOSIS — R921 Mammographic calcification found on diagnostic imaging of breast: Secondary | ICD-10-CM

## 2015-12-31 DIAGNOSIS — E559 Vitamin D deficiency, unspecified: Secondary | ICD-10-CM

## 2015-12-31 DIAGNOSIS — N6002 Solitary cyst of left breast: Secondary | ICD-10-CM | POA: Diagnosis not present

## 2015-12-31 DIAGNOSIS — Z79890 Hormone replacement therapy (postmenopausal): Secondary | ICD-10-CM

## 2016-03-09 ENCOUNTER — Other Ambulatory Visit
Admission: RE | Admit: 2016-03-09 | Discharge: 2016-03-09 | Disposition: A | Discharge status: Home / Self Care | Payer: Medicare Other | Source: Ambulatory Visit | Attending: Pain Medicine | Admitting: Pain Medicine

## 2016-03-09 ENCOUNTER — Encounter: Payer: Self-pay | Admitting: Pain Medicine

## 2016-03-09 ENCOUNTER — Ambulatory Visit: Payer: Medicare Other | Attending: Pain Medicine | Admitting: Pain Medicine

## 2016-03-09 ENCOUNTER — Encounter (INDEPENDENT_AMBULATORY_CARE_PROVIDER_SITE_OTHER): Payer: Self-pay

## 2016-03-09 VITALS — BP 117/60 | HR 65 | Temp 97.0°F | Resp 18 | Ht 63.0 in | Wt 155.0 lb

## 2016-03-09 DIAGNOSIS — Z5181 Encounter for therapeutic drug level monitoring: Secondary | ICD-10-CM

## 2016-03-09 DIAGNOSIS — M4802 Spinal stenosis, cervical region: Secondary | ICD-10-CM | POA: Insufficient documentation

## 2016-03-09 DIAGNOSIS — M549 Dorsalgia, unspecified: Secondary | ICD-10-CM | POA: Diagnosis present

## 2016-03-09 DIAGNOSIS — I2510 Atherosclerotic heart disease of native coronary artery without angina pectoris: Secondary | ICD-10-CM | POA: Insufficient documentation

## 2016-03-09 DIAGNOSIS — E612 Magnesium deficiency: Secondary | ICD-10-CM | POA: Insufficient documentation

## 2016-03-09 DIAGNOSIS — F411 Generalized anxiety disorder: Secondary | ICD-10-CM | POA: Diagnosis not present

## 2016-03-09 DIAGNOSIS — M79604 Pain in right leg: Secondary | ICD-10-CM | POA: Insufficient documentation

## 2016-03-09 DIAGNOSIS — K219 Gastro-esophageal reflux disease without esophagitis: Secondary | ICD-10-CM | POA: Diagnosis not present

## 2016-03-09 DIAGNOSIS — R32 Unspecified urinary incontinence: Secondary | ICD-10-CM | POA: Insufficient documentation

## 2016-03-09 DIAGNOSIS — M79605 Pain in left leg: Secondary | ICD-10-CM | POA: Insufficient documentation

## 2016-03-09 DIAGNOSIS — M79602 Pain in left arm: Secondary | ICD-10-CM

## 2016-03-09 DIAGNOSIS — G5602 Carpal tunnel syndrome, left upper limb: Secondary | ICD-10-CM

## 2016-03-09 DIAGNOSIS — F329 Major depressive disorder, single episode, unspecified: Secondary | ICD-10-CM | POA: Insufficient documentation

## 2016-03-09 DIAGNOSIS — M47812 Spondylosis without myelopathy or radiculopathy, cervical region: Secondary | ICD-10-CM | POA: Insufficient documentation

## 2016-03-09 DIAGNOSIS — E538 Deficiency of other specified B group vitamins: Secondary | ICD-10-CM | POA: Insufficient documentation

## 2016-03-09 DIAGNOSIS — G8929 Other chronic pain: Secondary | ICD-10-CM | POA: Diagnosis not present

## 2016-03-09 DIAGNOSIS — R920 Mammographic microcalcification found on diagnostic imaging of breast: Secondary | ICD-10-CM | POA: Insufficient documentation

## 2016-03-09 DIAGNOSIS — E559 Vitamin D deficiency, unspecified: Secondary | ICD-10-CM | POA: Insufficient documentation

## 2016-03-09 DIAGNOSIS — M5020 Other cervical disc displacement, unspecified cervical region: Secondary | ICD-10-CM

## 2016-03-09 DIAGNOSIS — M797 Fibromyalgia: Secondary | ICD-10-CM | POA: Insufficient documentation

## 2016-03-09 DIAGNOSIS — M25511 Pain in right shoulder: Secondary | ICD-10-CM | POA: Diagnosis not present

## 2016-03-09 DIAGNOSIS — M5412 Radiculopathy, cervical region: Secondary | ICD-10-CM

## 2016-03-09 DIAGNOSIS — M542 Cervicalgia: Secondary | ICD-10-CM

## 2016-03-09 DIAGNOSIS — M50222 Other cervical disc displacement at C5-C6 level: Secondary | ICD-10-CM | POA: Diagnosis not present

## 2016-03-09 DIAGNOSIS — M545 Low back pain, unspecified: Secondary | ICD-10-CM

## 2016-03-09 DIAGNOSIS — Z79891 Long term (current) use of opiate analgesic: Secondary | ICD-10-CM | POA: Insufficient documentation

## 2016-03-09 DIAGNOSIS — M791 Myalgia: Secondary | ICD-10-CM

## 2016-03-09 DIAGNOSIS — M25512 Pain in left shoulder: Secondary | ICD-10-CM | POA: Insufficient documentation

## 2016-03-09 DIAGNOSIS — F40240 Claustrophobia: Secondary | ICD-10-CM | POA: Insufficient documentation

## 2016-03-09 DIAGNOSIS — M5382 Other specified dorsopathies, cervical region: Secondary | ICD-10-CM | POA: Diagnosis not present

## 2016-03-09 DIAGNOSIS — K589 Irritable bowel syndrome without diarrhea: Secondary | ICD-10-CM | POA: Insufficient documentation

## 2016-03-09 DIAGNOSIS — M79601 Pain in right arm: Secondary | ICD-10-CM

## 2016-03-09 DIAGNOSIS — I471 Supraventricular tachycardia: Secondary | ICD-10-CM | POA: Diagnosis not present

## 2016-03-09 DIAGNOSIS — M7918 Myalgia, other site: Secondary | ICD-10-CM

## 2016-03-09 DIAGNOSIS — M1288 Other specific arthropathies, not elsewhere classified, other specified site: Secondary | ICD-10-CM

## 2016-03-09 DIAGNOSIS — M47816 Spondylosis without myelopathy or radiculopathy, lumbar region: Secondary | ICD-10-CM | POA: Diagnosis not present

## 2016-03-09 DIAGNOSIS — J449 Chronic obstructive pulmonary disease, unspecified: Secondary | ICD-10-CM | POA: Diagnosis not present

## 2016-03-09 LAB — MAGNESIUM: MAGNESIUM: 1.8 mg/dL (ref 1.7–2.4)

## 2016-03-09 LAB — VITAMIN B12: Vitamin B-12: 942 pg/mL — ABNORMAL HIGH (ref 180–914)

## 2016-03-09 MED ORDER — CYANOCOBALAMIN 2000 MCG PO TABS: 2000.0000 ug | ORAL_TABLET | Freq: Every day | ORAL | Status: DC

## 2016-03-09 MED ORDER — VITAMIN D3 50 MCG (2000 UT) PO CAPS: 2000.0000 [IU] | ORAL_CAPSULE | Freq: Every day | ORAL | Status: DC

## 2016-03-09 MED ORDER — CARISOPRODOL 350 MG PO TABS: 350.0000 mg | ORAL_TABLET | Freq: Three times a day (TID) | ORAL | Status: DC

## 2016-03-09 MED ORDER — HYDROCODONE-ACETAMINOPHEN 5-325 MG PO TABS: 1.0000 | ORAL_TABLET | ORAL | Status: DC | PRN

## 2016-03-09 MED ORDER — PREGABALIN 25 MG PO CAPS: 25.0000 mg | ORAL_CAPSULE | Freq: Every day | ORAL | Status: DC

## 2016-03-09 MED ORDER — PREGABALIN 100 MG PO CAPS: 100.0000 mg | ORAL_CAPSULE | Freq: Every day | ORAL | Status: DC

## 2016-03-09 MED ORDER — MAGNESIUM OXIDE -MG SUPPLEMENT 500 MG PO CAPS: 1.0000 | ORAL_CAPSULE | Freq: Every day | ORAL | Status: DC

## 2016-03-09 NOTE — Progress Notes (Signed)
Patient's Name: Zoe Sanchez  Patient type: Established  MRN: [id number]  Service setting: Ambulatory outpatient  DOB: December 23, 1955  Location: [facility]  DOS: 03/09/2016  Primary Care Physician: Baltazar Apo, MD  Note by: Kathlen Brunswick. Dossie Arbour, M.D, DABA, Sarita Haver, DABPM, Milagros Evener, [place]  Referring Physician: Denton Lank, MD  Specialty: Board-Certified Interventional Pain Management  Last Visit to Pain Management: 12/23/2015   Primary Reason(s) for Visit: Encounter for prescription drug management (Level of risk: moderate) CC: Neck Pain and Back Pain   HPI  Zoe Sanchez is a 60 y.o. year old, female patient, who returns today as an established patient. She has Abnormal mammogram; Microcalcifications of the breast; Encounter for therapeutic drug level monitoring; Long term current use of opiate analgesic; Opiate use (30 MME/Day); Magnesium deficiency; Vitamin B12 deficiency; Myofascial pain syndrome; Cervical facet syndrome (Location of Primary Source of Pain) (Bilateral) (R>L); Chronic neck pain (Location of Primary Source of Pain) (Bilateral) (R>L); Chronic pain syndrome; Cervical spondylosis; Anxiety; Awareness of heartbeats; Paroxysmal supraventricular tachycardia ([place]); Breath shortness; Syncope and collapse; Fibromyalgia; Cervical herniated disc (C5-6 and C6-7); Cervical foraminal stenosis (Bilateral) (C5-6); Chronic cervical radicular pain (Location of Secondary source of pain) (Bilateral) (R>L) (C8 Dermatome); Chronic low back pain (Location of Tertiary source of pain) (Bilateral) (R>L); Lumbar spondylosis; Lower extremity pain (Bilateral) (L>R); Coronary atherosclerosis of native coronary artery; Generalized anxiety disorder; Intermittent urinary incontinence; History of psychiatric disorder; Depression; Family history of chronic pain; History of attempted suicide; History of suicidal ideation; Irritable bowel syndrome; Intermittent diarrhea; Chronic constipation; Insomnia;  History of hiatal hernia; Chronic pain; Long term prescription opiate use; Vitamin D insufficiency; 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. Her primarily concern today is the Neck Pain and Back Pain   Pain Assessment: Self-Reported Pain Score: 2  Reported level is compatible with observation       Pain Type: Chronic pain Pain Location: Back (neckl) Pain Orientation: Lower Pain Descriptors / Indicators: Aching, Sharp, Dull Pain Frequency: Constant  The patient comes into the clinics today for pharmacological management of her chronic pain. I last saw this patient on 12/23/2015. The patient  reports that she does not use illicit drugs. Her body mass index is 27.46 kg/(m^2).  Date of Last Visit: 12/23/15 Service Provided on Last Visit: Med Refill  Controlled Substance Pharmacotherapy Assessment & REMS (Risk Evaluation and Mitigation Strategy)  Analgesic: Hydrocodone/APAP 5/325 one cue 4 hours (6 per day) (30 mg/day) MME/day: 30 mg/day.  Pill Count: Hydrocodone pill count # 139/180 Filled 03-01-16. Pharmacokinetics: Onset of action (Liberation/Absorption): Within expected pharmacological parameters Time to Peak effect (Distribution): Timing and results are as within normal expected parameters Duration of action (Metabolism/Excretion): Within normal limits for medication Pharmacodynamics: Analgesic Effect: More than 50% Activity Facilitation: Medication(s) allow patient to sit, stand, walk, and do the basic ADLs Perceived Effectiveness: Described as relatively effective, allowing for increase in activities of daily living (ADL) Side-effects or Adverse reactions: None reported Monitoring: [place] PMP: Online review of the past 83-monthperiod conducted. Compliant with practice rules  and regulations Last UDS on record: TOXASSURE SELECT 13  Date Value Ref Range Status  03/09/2016 FINAL  Final    Comment:    ==================================================================== TOXASSURE SELECT 13 (MW) ==================================================================== Test  Result       Flag       Units Drug Present and Declared for Prescription Verification   Hydrocodone                    >9434        EXPECTED   ng/mg creat   Hydromorphone                  443          EXPECTED   ng/mg creat   Dihydrocodeine                 459          EXPECTED   ng/mg creat   Norhydrocodone                 >4717        EXPECTED   ng/mg creat    Sources of hydrocodone include scheduled prescription    medications. Hydromorphone, dihydrocodeine and norhydrocodone are    expected metabolites of hydrocodone. Hydromorphone and    dihydrocodeine are also available as scheduled prescription    medications. Drug Absent but Declared for Prescription Verification   Diazepam                       Not Detected UNEXPECTED ng/mg creat ==================================================================== Test                      Result    Flag   Units      Ref Range   Creatinine              106              mg/dL      >=20 ==================================================================== Declared Medications:  The flagging and interpretation on this report are based on the  following declared medications.  Unexpected results may arise from  inaccuracies in the declared medications.  **Note: The testing scope of this panel includes these medications:  Diazepam (Valium)  Hydrocodone (Norco)  **Note: The testing scope of this panel does not include following  reported medications:  Acetaminophen (Norco)  Amitriptyline (Elavil)  Carisoprodol (Soma)  Cholecalciferol  Cyanocobalamin  Estradiol (Estrace)  Gemfibrozil (Lopid)  Linaclotide (Linzess)  Magnesium  (Mag-Ox)  Montelukast (Singulair)  Omeprazole  Pregabalin (Lyrica)  Propranolol (Inderal)  Salmeterol (Serevent)  Umeclidinium (Incruse Ellipta)  Zolpidem (Ambien) ==================================================================== For clinical consultation, please call [phone]. ====================================================================    UDS interpretation: Compliant Patient informed of the CDC guidelines and recommendations to stay away from the concomitant use of benzodiazepines and opioids due to the increased risk of respiratory depression and death. Medication Assessment Form: Reviewed. Patient indicates being compliant with therapy Treatment compliance: Compliant Risk Assessment: Aberrant Behavior: None observed today Substance Use Disorder (SUD) Risk Level: No change since last visit Risk of opioid abuse or dependence: 0.7-3.0% with doses ? 36 MME/day and 6.1-26% with doses ? 120 MME/day. Opioid Risk Tool (ORT) Score: Total Score: 1 Low Risk for SUD (Score <3) Depression Scale Score: PHQ-2: PHQ-2 Total Score: 0 No depression (0) PHQ-9: PHQ-9 Total Score: 0 No depression (0-4)  Pharmacologic Plan: No change in therapy, at this time  Laboratory Chemistry  Inflammation Markers Lab Results  Component Value Date   ESRSEDRATE 26 09/28/2015    Renal Function Lab Results  Component Value Date   BUN 11 09/28/2015   CREATININE 0.70 09/28/2015   GFRAA >60  09/28/2015   GFRNONAA >60 09/28/2015    Hepatic Function Lab Results  Component Value Date   AST 19 09/28/2015   ALT 22 09/28/2015   ALBUMIN 3.5 09/28/2015    Electrolytes Lab Results  Component Value Date   NA 137 09/28/2015   K 4.2 09/28/2015   CL 106 09/28/2015   CALCIUM 8.9 09/28/2015   MG 1.8 03/09/2016    Pain Modulating Vitamins Lab Results  Component Value Date   25OHVITD1 49 03/09/2016   25OHVITD2 <1.0 03/09/2016   25OHVITD3 48 03/09/2016   VITAMINB12 942* 03/09/2016     Coagulation Parameters Lab Results  Component Value Date   INR 0.9 03/06/2012   LABPROT 12.8 03/06/2012   PLT 415 11/09/2012    Note: Labs Reviewed.  Recent Diagnostic Imaging  Dg Bone Density  12/31/2015  EXAM: DUAL X-RAY ABSORPTIOMETRY (DXA) FOR BONE MINERAL DENSITY IMPRESSION: Dear Dr. Posey Pronto, Your patient Nalina Yeatman completed a BMD test on 12/31/2015 using the [place] (analysis version: 14.10) manufactured by [employer]. The following summarizes the results of our evaluation. PATIENT BIOGRAPHICAL: Name: Aashka, Salomone Patient ID: [id number] Birth Date: 1956/07/12 Height: 63.0 in. Gender: Female Exam Date: 12/31/2015 Weight: 150.0 lbs. Indications: asthma, Caucasian, History of Fracture (Adult), Hysterectomy, Oophorectomy Bilateral, Postmenopausal, Vit D Defic Fractures: Left ankle, Right ankle Treatments: omeprazole, singulair, ventolin HFA, Vit D ASSESSMENT: The BMD measured at Femur Neck Left is 0.966 g/cm2 with a T-score of -0.5. This patient is considered normal according to [facility]) criteria. Site Region Measured Measured WHO Young Adult BMD Date       Age      Classification T-score AP Spine L1-L4 12/31/2015 60.7 Normal 0.6 1.268 g/cm2 DualFemur Neck Left 12/31/2015 60.7 Normal -0.5 0.966 g/cm2 World Health Organization [facility]) criteria for post-menopausal, Caucasian Women: Normal:       T-score at or above -1 SD Osteopenia:   T-score between -1 and -2.5 SD Osteoporosis: T-score at or below -2.5 SD RECOMMENDATIONS: [place] recommends that FDA-approved medical therapies be considered in postmenopausal women and men age 21 or older with a: 1. Hip or vertebral (clinical or morphometric) fracture. 2. T-score of < -2.5 at the spine or hip. 3. Ten-year fracture probability by FRAX of 3% or greater for hip fracture or 20% or greater for major osteoporotic fracture. All treatment decisions require clinical judgment and  consideration of individual patient factors, including patient preferences, co-morbidities, previous drug use, risk factors not captured in the FRAX model (e.g. falls, vitamin D deficiency, increased bone turnover, interval significant decline in bone density) and possible under - or over-estimation of fracture risk by FRAX. All patients should ensure an adequate intake of dietary calcium (1200 mg/d) and vitamin D (800 IU daily) unless contraindicated. FOLLOW-UP: People with diagnosed cases of osteoporosis or at high risk for fracture should have regular bone mineral density tests. For patients eligible for Medicare, routine testing is allowed once every 2 years. The testing frequency can be increased to one year for patients who have rapidly progressing disease, those who are receiving or discontinuing medical therapy to restore bone mass, or have additional risk factors. I have reviewed this report, and agree with the above findings. [facility] Electronically Signed   By: David  Martinique M.D.   On: 12/31/2015 14:06   US Breast Ltd Uni Left Inc Axilla  12/31/2015  CLINICAL DATA:  60 year old female with history of stereotactic guided biopsy of the right breast in June of  2015. Biopsy results demonstrated microcalcifications associated with fibrocystic change, columnar cell change, pseudo angiomatus stromal hyperplasia, sclerosing adenosis, usual ductal hyperplasia, and cyst formation. 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 No atypia or malignancy was seen. Surgical consultation for excision was recommended. The patient saw Dr. Bary Castilla who did not feel that surgical excision was warranted. EXAM: 2D DIGITAL DIAGNOSTIC BILATERAL MAMMOGRAM WITH CAD AND ADJUNCT TOMO ULTRASOUND LEFT BREAST COMPARISON:  Previous exam(s). ACR Breast Density Category c: The breast tissue is heterogeneously dense, which may obscure small masses. FINDINGS: Top hat shaped clip from prior stereotactic  guided biopsy is seen within the lateral right breast. No new or suspicious abnormality is seen at the site of prior biopsy. There are numerous bilateral oval, circumscribed, low-density masses. There is a dominant 1.5cm mass within the lower, inner left breast, middle depth for which further evaluation with ultrasound was performed. Mammographic images were processed with CAD. Targeted ultrasound of the lower, inner left breast was performed, showing a cluster of cysts at 8 o'clock, 3 cm from the nipple measuring approximately 12 x 6 x 11 mm, corresponding to the mammographic finding. No suspicious sonographic finding was identified in the area of concern. IMPRESSION: No mammographic or sonographic evidence of malignancy. RECOMMENDATION: Screening mammogram in one year.(Code:SM-B-01Y) I have discussed the findings and recommendations with the patient. Results were also provided in writing at the conclusion of the visit. If applicable, a reminder letter will be sent to the patient regarding the next appointment. BI-RADS CATEGORY  2: Benign. Electronically Signed   By: Pamelia Hoit M.D.   On: 12/31/2015 17:13   Mm Diag Breast Tomo Bilateral  12/31/2015  CLINICAL DATA:  60 year old female with history of stereotactic guided biopsy of the right breast in June of 2015. Biopsy results demonstrated microcalcifications associated with fibrocystic change, columnar cell change, pseudo angiomatus stromal hyperplasia, sclerosing adenosis, usual ductal hyperplasia, and cyst formation. 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 No atypia or malignancy was seen. Surgical consultation for excision was recommended. The patient saw Dr. Bary Castilla who did not feel that surgical excision was warranted. EXAM: 2D DIGITAL DIAGNOSTIC BILATERAL MAMMOGRAM WITH CAD AND ADJUNCT TOMO ULTRASOUND LEFT BREAST COMPARISON:  Previous exam(s). ACR Breast Density Category c: The breast tissue is heterogeneously dense, which may  obscure small masses. FINDINGS: Top hat shaped clip from prior stereotactic guided biopsy is seen within the lateral right breast. No new or suspicious abnormality is seen at the site of prior biopsy. There are numerous bilateral oval, circumscribed, low-density masses. There is a dominant 1.5cm mass within the lower, inner left breast, middle depth for which further evaluation with ultrasound was performed. Mammographic images were processed with CAD. Targeted ultrasound of the lower, inner left breast was performed, showing a cluster of cysts at 8 o'clock, 3 cm from the nipple measuring approximately 12 x 6 x 11 mm, corresponding to the mammographic finding. No suspicious sonographic finding was identified in the area of concern. IMPRESSION: No mammographic or sonographic evidence of malignancy. RECOMMENDATION: Screening mammogram in one year.(Code:SM-B-01Y) I have discussed the findings and recommendations with the patient. Results were also provided in writing at the conclusion of the visit. If applicable, a reminder letter will be sent to the patient regarding the next appointment. BI-RADS CATEGORY  2: Benign. Electronically Signed   By: Pamelia Hoit M.D.   On: 12/31/2015 17:13   Cervical Imaging: Cervical MR wo contrast:  Results for orders placed in visit on 02/09/12  MR C Spine Ltd W/O Cm   Narrative * PRIOR REPORT IMPORTED FROM AN EXTERNAL SYSTEM *   PRIOR REPORT IMPORTED FROM THE [facility] WORKFLOW SYSTEM   REASON FOR EXAM:    cervical radiculitis  COMMENTS:   PROCEDURE:     MMR - MMR CERVICAL SPINE WO CONT  - Feb 09 2012  2:26PM   RESULT:     MRI cervical spine   Comparison:  None   Indication: Cervical radiculitis   Technique: Multiplanar and multisequence MR imaging of the cervical spine  was performed without contrast.   Findings:   The cervical cord is normal in size and signal. The cervical spine is  normal  in lordotic alignment, without listhesis. Bone marrow signal is normal.   Cerebellar tonsils are normal in position. Vertebral body heights are  maintained.   C2-3: Mild broad-based disc bulge. Mild bilateral facet arthropathy, right  greater than left. No foraminal stenosis.   C3-4:  Moderate broad-based disc bulge. Moderate bilateral facet  arthropathy. Left uncovertebral degenerative change. Severe left foraminal  stenosis. No right foraminal stenosis.   C4-5: Moderate broad-based disc bulge. Bilateral uncovertebral  degenerative  change, left greater than right. Severe left foraminal stenosis. Mild  right  foraminal stenosis.   C5-6:  Mild broad-based disc bulge. Bilateral uncovertebral degenerative  changes resulting in severe bilateral foraminal stenosis, left greater  than  right.   C6-7:  Mild broad-based disc bulge. No foraminal or central canal  stenosis.   C7-T1: No significant disc bulge, central canal stenosis, or foraminal  narrowing.   IMPRESSION:   1. Cervical spondylosis as described above.   Dictation Site: 1       Lumbosacral Imaging: Lumbar MR wo contrast:  Results for orders placed in visit on 03/19/10  MR L Spine Ltd W/O Cm   Narrative * PRIOR REPORT IMPORTED FROM AN EXTERNAL SYSTEM *   PRIOR REPORT IMPORTED FROM THE SYNGO WORKFLOW SYSTEM   REASON FOR EXAM:    back pain increasing thigh numbness  COMMENTS:   PROCEDURE:     MR  - MR LUMBAR SPINE WO CONTRAST  - Mar 19 2010 12:57PM   RESULT:   Multiplanar and multisequence imaging of the lumbar spine was obtained  without the administration of gadolinium.   The conus medullaris terminates at an L1 level. The cauda equina  demonstrate  no evidence of clumping nor thickening.   Evaluation of the osseous structures demonstrates no T1 or T2 signal  abnormalities.   At the T12-L1, L1-L2, L2-L3, L3-L4, L4-L5, and L5-S1 levels there is no  evidence of thecal sac stenosis. There is evidence of neural foraminal  narrowing on the left at the L3-L4 level. There does  not appear to be  evidence of exiting nerve root compression though mild compromise cannot  be  excluded. There is no evidence of significant disc desiccation.   IMPRESSION:   1. Mild neural foraminal narrowing on the left at the L3-L4 level  secondary  to lateralization of the disc bulge. There is no evidence of exiting nerve  root compression though compromise cannot be excluded.  2. No further focal or acute abnormalities.   Thank you for the opportunity to contribute to the care of your patient.       Note: Imaging reviewed.  Meds  The patient has a current medication list which includes the following prescription(s): amitriptyline, carisoprodol, vitamin d3, cyanocobalamin, diazepam, estradiol, gemfibrozil, hydrocodone-acetaminophen, hydrocodone-acetaminophen, hydrocodone-acetaminophen, incruse ellipta, linaclotide, magnesium oxide,  montelukast, omeprazole, pregabalin, pregabalin, propranolol, serevent diskus, and zolpidem.  Current Outpatient Prescriptions on File Prior to Visit  Medication Sig  . amitriptyline (ELAVIL) 100 MG tablet 200 mg at bedtime.   . diazepam (VALIUM) 5 MG tablet Take 1 pill 30-45 minutes before MRI. Take a second pill before going in, if still anxious.  Marland Kitchen estradiol (ESTRACE) 2 MG tablet Take 2 mg by mouth daily.  Marland Kitchen gemfibrozil (LOPID) 600 MG tablet Take by mouth 2 (two) times daily before a meal.   . montelukast (SINGULAIR) 10 MG tablet Take by mouth at bedtime.   Marland Kitchen omeprazole (PRILOSEC) 20 MG capsule Take 20 mg by mouth 2 (two) times daily before a meal.   . propranolol (INDERAL) 40 MG tablet Take 40 mg by mouth 4 (four) times daily. Takes 5 tabs five times per day.  . SEREVENT DISKUS 50 MCG/DOSE diskus inhaler 2 puffs 1 day or 1 dose.   . zolpidem (AMBIEN) 10 MG tablet 10 mg at bedtime.    No current facility-administered medications on file prior to visit.    ROS  Constitutional: Denies any fever or chills Gastrointestinal: No reported hemesis,  hematochezia, vomiting, or acute GI distress Musculoskeletal: Denies any acute onset joint swelling, redness, loss of ROM, or weakness Neurological: No reported episodes of acute onset apraxia, aphasia, dysarthria, agnosia, amnesia, paralysis, loss of coordination, or loss of consciousness  Allergies  Ms. Nola is allergic to prednisone and other.  [place]  Medical:  Ms. Treat  has a past medical history of Asthma; Degenerative disk disease; Fibromyalgia; Irritable bowel syndrome; Endometriosis; Insomnia; Chronic pain; Anxiety; Depression; GERD (gastroesophageal reflux disease); Fibromyalgia; CAD (coronary artery disease); Insomnia; COPD (chronic obstructive pulmonary disease) ([place]); Hiatal hernia; Abnormal heart rhythm; Hyperlipidemia; Cancer ([place]); History of psychiatric disorder (06/29/2015); Family history of chronic pain (06/29/2015); History of attempted suicide (06/29/2015); History of suicidal ideation (06/29/2015); History of hiatal hernia (06/29/2015); and Cervical radicular pain (Location of Secondary source of pain) (Bilateral) (R>L) (C8 Dermatome) (06/29/2015). Family: family history includes Breast cancer in her maternal aunt; Heart disease in her father; Hypertension in her father and mother. Surgical:  has past surgical history that includes Abdominal hysterectomy (1986); Tonsillectomy (1957); Skin lesion excision; Breast surgery; and Breast biopsy (Right, 2016). Tobacco:  reports that she has never smoked. She does not have any smokeless tobacco history on file. Alcohol:  reports that she does not drink alcohol. Drug:  reports that she does not use illicit drugs.  Constitutional Exam  Vitals: Blood pressure 117/60, pulse 65, temperature 97 F (36.1 C), resp. rate 18, height '5\' 3"'  (1.6 m), weight 155 lb (70.308 kg), SpO2 98 %. General appearance: Well nourished, well developed, and well hydrated. In no acute distress Calculated BMI/Body habitus: Body mass index is 27.46 kg/(m^2).  (25-29.9 kg/m2) Overweight - 20% higher incidence of chronic pain Psych/Mental status: Alert and oriented x 3 (person, place, & time) Eyes: PERLA Respiratory: No evidence of acute respiratory distress  Cervical Spine Exam  Inspection: No masses, redness, or swelling Alignment: Symmetrical ROM: Functional: ROM is within functional limits [facility]) Stability: No instability detected Muscle strength & Tone: Functionally intact Sensory: Unimpaired Palpation: No complaints of tenderness  Upper Extremity (UE) Exam    Side: Right upper extremity  Side: Left upper extremity  Inspection: No masses, redness, swelling, or asymmetry  Inspection: No masses, redness, swelling, or asymmetry  ROM:  ROM:  Functional: ROM is within functional limits [facility])  Functional: ROM is within functional limits [facility])  Muscle  strength & Tone: Functionally intact  Muscle strength & Tone: Functionally intact  Sensory: Unimpaired  Sensory: Unimpaired  Palpation: Non-contributory  Palpation: Non-contributory   Thoracic Spine Exam  Inspection: No masses, redness, or swelling Alignment: Symmetrical ROM: Functional: ROM is within functional limits [facility]) Stability: No instability detected Sensory: Unimpaired Muscle strength & Tone: Functionally intact Palpation: No complaints of tenderness  Lumbar Spine Exam  Inspection: No masses, redness, or swelling Alignment: Symmetrical ROM: Functional: ROM is within functional limits [facility]) Stability: No instability detected Muscle strength & Tone: Functionally intact Sensory: Unimpaired Palpation: No complaints of tenderness Provocative Tests: Lumbar Hyperextension and rotation test: deferred Patrick's Maneuver: deferred  Gait & Posture Assessment  Ambulation: Unassisted Gait: Unaffected Posture: WNL  Lower Extremity Exam    Side: Right lower extremity  Side: Left lower extremity  Inspection: No masses, redness, swelling, or asymmetry ROM:  Inspection: No masses,  redness, swelling, or asymmetry ROM:  Functional: ROM is within functional limits [facility])  Functional: ROM is within functional limits [facility])  Muscle strength & Tone: Functionally intact  Muscle strength & Tone: Functionally intact  Sensory: Unimpaired  Sensory: Unimpaired  Palpation: Non-contributory  Palpation: Non-contributory   Assessment & Plan  Primary Diagnosis & Pertinent Problem List: The primary encounter diagnosis was Chronic pain. Diagnoses of Encounter for therapeutic drug level monitoring, Long term current use of opiate analgesic, Cervical facet syndrome (Location of Primary Source of Pain) (Bilateral) (R>L), Magnesium deficiency, Fibromyalgia, Myofascial pain syndrome, Vitamin B12 deficiency, Vitamin D insufficiency, Carpal tunnel syndrome (Left), Cervical foraminal stenosis (Bilateral) (C5-6), Cervical herniated disc (C5-6 and C6-7), Cervical spondylosis, Chronic cervical radicular pain (Location of Secondary source of pain) (Bilateral) (R>L) (C8 Dermatome), Chronic low back pain (Location of Tertiary source of pain) (Bilateral) (R>L), Chronic neck pain (Location of Primary Source of Pain) (Bilateral) (R>L), Chronic shoulder pain (Bilateral), Chronic pain of both upper extremities, Lower extremity pain (Bilateral) (L>R), Lumbar spondylosis, unspecified spinal osteoarthritis, and Cervical facet arthropathy (Location of Primary Source of Pain) (Bilateral) (R>L) were also pertinent to this visit.  Visit Diagnosis: 1. Chronic pain   2. Encounter for therapeutic drug level monitoring   3. Long term current use of opiate analgesic   4. Cervical facet syndrome (Location of Primary Source of Pain) (Bilateral) (R>L)   5. Magnesium deficiency   6. Fibromyalgia   7. Myofascial pain syndrome   8. Vitamin B12 deficiency   9. Vitamin D insufficiency   10. Carpal tunnel syndrome (Left)   11. Cervical foraminal stenosis (Bilateral) (C5-6)   12. Cervical herniated disc (C5-6 and C6-7)   13.  Cervical spondylosis   14. Chronic cervical radicular pain (Location of Secondary source of pain) (Bilateral) (R>L) (C8 Dermatome)   15. Chronic low back pain (Location of Tertiary source of pain) (Bilateral) (R>L)   16. Chronic neck pain (Location of Primary Source of Pain) (Bilateral) (R>L)   17. Chronic shoulder pain (Bilateral)   18. Chronic pain of both upper extremities   19. Lower extremity pain (Bilateral) (L>R)   20. Lumbar spondylosis, unspecified spinal osteoarthritis   21. Cervical facet arthropathy (Location of Primary Source of Pain) (Bilateral) (R>L)     Problems updated and reviewed during this visit: Problem  Cervical facet arthropathy    Problem-specific Plan(s): No problem-specific assessment & plan notes found for this encounter.  No new assessment & plan notes have been filed under this hospital service since the last note was generated. Service: Pain Management   Plan of Care   Problem  List Items Addressed This Visit      High   Carpal tunnel syndrome (Left) (Chronic)   Relevant Medications   pregabalin (LYRICA) 100 MG capsule   pregabalin (LYRICA) 25 MG capsule   carisoprodol (SOMA) 350 MG tablet   Cervical facet arthropathy (Chronic)   Relevant Orders   CERVICAL FACET (MEDIAL BRANCH NERVE BLOCK)    Cervical facet syndrome (Location of Primary Source of Pain) (Bilateral) (R>L) (Chronic)   Relevant Orders   CERVICAL FACET (MEDIAL BRANCH NERVE BLOCK)    Cervical foraminal stenosis (Bilateral) (C5-6) (Chronic)   Relevant Orders   CERVICAL EPIDURAL STEROID INJECTION   Cervical herniated disc (C5-6 and C6-7) (Chronic)   Relevant Orders   CERVICAL EPIDURAL STEROID INJECTION   Cervical spondylosis (Chronic)   Relevant Medications   HYDROcodone-acetaminophen (NORCO/VICODIN) 5-325 MG tablet   HYDROcodone-acetaminophen (NORCO/VICODIN) 5-325 MG tablet   HYDROcodone-acetaminophen (NORCO/VICODIN) 5-325 MG tablet   carisoprodol (SOMA) 350 MG tablet   Other  Relevant Orders   CERVICAL FACET (MEDIAL BRANCH NERVE BLOCK)    CERVICAL EPIDURAL STEROID INJECTION   Chronic cervical radicular pain (Location of Secondary source of pain) (Bilateral) (R>L) (C8 Dermatome) (Chronic)   Relevant Medications   pregabalin (LYRICA) 100 MG capsule   pregabalin (LYRICA) 25 MG capsule   carisoprodol (SOMA) 350 MG tablet   Other Relevant Orders   CERVICAL EPIDURAL STEROID INJECTION   Chronic low back pain (Location of Tertiary source of pain) (Bilateral) (R>L) (Chronic)   Relevant Medications   HYDROcodone-acetaminophen (NORCO/VICODIN) 5-325 MG tablet   HYDROcodone-acetaminophen (NORCO/VICODIN) 5-325 MG tablet   HYDROcodone-acetaminophen (NORCO/VICODIN) 5-325 MG tablet   carisoprodol (SOMA) 350 MG tablet   Other Relevant Orders   LUMBAR FACET(MEDIAL BRANCH NERVE BLOCK) MBNB   LUMBAR EPIDURAL STEROID INJECTION   Chronic neck pain (Location of Primary Source of Pain) (Bilateral) (R>L) (Chronic)   Relevant Medications   pregabalin (LYRICA) 100 MG capsule   pregabalin (LYRICA) 25 MG capsule   HYDROcodone-acetaminophen (NORCO/VICODIN) 5-325 MG tablet   HYDROcodone-acetaminophen (NORCO/VICODIN) 5-325 MG tablet   HYDROcodone-acetaminophen (NORCO/VICODIN) 5-325 MG tablet   carisoprodol (SOMA) 350 MG tablet   Other Relevant Orders   CERVICAL FACET (MEDIAL BRANCH NERVE BLOCK)    Chronic pain - Primary (Chronic)   Relevant Medications   pregabalin (LYRICA) 100 MG capsule   pregabalin (LYRICA) 25 MG capsule   HYDROcodone-acetaminophen (NORCO/VICODIN) 5-325 MG tablet   HYDROcodone-acetaminophen (NORCO/VICODIN) 5-325 MG tablet   HYDROcodone-acetaminophen (NORCO/VICODIN) 5-325 MG tablet   carisoprodol (SOMA) 350 MG tablet   Chronic shoulder pain (Bilateral) (Chronic)   Relevant Orders   SHOULDER INJECTION   SUPRASCAPULAR NERVE BLOCK   Chronic upper extremity pain (Location of Secondary source of pain) (Bilateral) (R>L) (Chronic)   Relevant Medications   pregabalin  (LYRICA) 100 MG capsule   pregabalin (LYRICA) 25 MG capsule   HYDROcodone-acetaminophen (NORCO/VICODIN) 5-325 MG tablet   HYDROcodone-acetaminophen (NORCO/VICODIN) 5-325 MG tablet   HYDROcodone-acetaminophen (NORCO/VICODIN) 5-325 MG tablet   carisoprodol (SOMA) 350 MG tablet   Other Relevant Orders   CERVICAL EPIDURAL STEROID INJECTION   Fibromyalgia (Chronic)   Relevant Medications   pregabalin (LYRICA) 100 MG capsule   pregabalin (LYRICA) 25 MG capsule   Lower extremity pain (Bilateral) (L>R) (Chronic)   Relevant Orders   LUMBAR EPIDURAL STEROID INJECTION   Lumbar spondylosis (Chronic)   Relevant Medications   HYDROcodone-acetaminophen (NORCO/VICODIN) 5-325 MG tablet   HYDROcodone-acetaminophen (NORCO/VICODIN) 5-325 MG tablet   HYDROcodone-acetaminophen (NORCO/VICODIN) 5-325 MG tablet   carisoprodol (SOMA) 350 MG tablet  Other Relevant Orders   LUMBAR FACET(MEDIAL BRANCH NERVE BLOCK) MBNB   LUMBAR EPIDURAL STEROID INJECTION   Myofascial pain syndrome (Chronic)   Relevant Medications   carisoprodol (SOMA) 350 MG tablet     Medium   Encounter for therapeutic drug level monitoring   Long term current use of opiate analgesic (Chronic)   Relevant Orders   ToxASSURE Select 13 (MW), Urine (Completed)     Low   Magnesium deficiency   Relevant Medications   Magnesium Oxide 500 MG CAPS   Other Relevant Orders   Magnesium (Completed)   Vitamin B12 deficiency   Relevant Medications   cyanocobalamin ([facility] VITAMIN B12) 2000 MCG tablet   Other Relevant Orders   Vitamin B12 (Completed)   Vitamin D insufficiency   Relevant Medications   Cholecalciferol (VITAMIN D3) 2000 units capsule   Other Relevant Orders   25-Hydroxyvitamin D Lcms D2+D3 (Completed)       Pharmacotherapy (Medications Ordered): Meds ordered this encounter  Medications  . Magnesium Oxide 500 MG CAPS    Sig: Take 1 capsule (500 mg total) by mouth daily.    Dispense:  100 capsule    Refill:  2    Do not  place this medication, or any other prescription from our practice, on "Automatic Refill". Patient may have prescription filled one day early if pharmacy is closed on scheduled refill date.  . pregabalin (LYRICA) 100 MG capsule    Sig: Take 1 capsule (100 mg total) by mouth at bedtime.    Dispense:  30 capsule    Refill:  2    Do not place this medication, or any other prescription from our practice, on "Automatic Refill". Patient may have prescription filled one day early if pharmacy is closed on scheduled refill date.  . pregabalin (LYRICA) 25 MG capsule    Sig: Take 1 capsule (25 mg total) by mouth at bedtime.    Dispense:  30 capsule    Refill:  2    Do not place this medication, or any other prescription from our practice, on "Automatic Refill". Patient may have prescription filled one day early if pharmacy is closed on scheduled refill date.  Marland Kitchen HYDROcodone-acetaminophen (NORCO/VICODIN) 5-325 MG tablet    Sig: Take 1 tablet by mouth every 4 (four) hours as needed for severe pain.    Dispense:  180 tablet    Refill:  0    Do not place this medication, or any other prescription from our practice, on "Automatic Refill". Patient may have prescription filled one day early if pharmacy is closed on scheduled refill date. Do not fill until: 03/29/16 To last until: 04/28/16  . HYDROcodone-acetaminophen (NORCO/VICODIN) 5-325 MG tablet    Sig: Take 1 tablet by mouth every 4 (four) hours as needed for severe pain.    Dispense:  180 tablet    Refill:  0    Do not place this medication, or any other prescription from our practice, on "Automatic Refill". Patient may have prescription filled one day early if pharmacy is closed on scheduled refill date. Do not fill until: 04/28/16 To last until: 05/28/16  . HYDROcodone-acetaminophen (NORCO/VICODIN) 5-325 MG tablet    Sig: Take 1 tablet by mouth every 4 (four) hours as needed for severe pain.    Dispense:  180 tablet    Refill:  0    Do not place  this medication, or any other prescription from our practice, on "Automatic Refill". Patient may have prescription filled one day  early if pharmacy is closed on scheduled refill date. Do not fill until: 05/28/16 To last until: 06/27/16  . carisoprodol (SOMA) 350 MG tablet    Sig: Take 1 tablet (350 mg total) by mouth 3 (three) times daily.    Dispense:  90 tablet    Refill:  2    Do not place this medication, or any other prescription from our practice, on "Automatic Refill". Patient may have prescription filled one day early if pharmacy is closed on scheduled refill date.  . cyanocobalamin ([facility] VITAMIN B12) 2000 MCG tablet    Sig: Take 1 tablet (2,000 mcg total) by mouth daily.    Dispense:  30 tablet    Refill:  2    Do not place this medication, or any other prescription from our practice, on "Automatic Refill".  . Cholecalciferol (VITAMIN D3) 2000 units capsule    Sig: Take 1 capsule (2,000 Units total) by mouth daily.    Dispense:  30 capsule    Refill:  2    Do not place this medication, or any other prescription from our practice, on "Automatic Refill".    Lab-work & Procedure Ordered: Orders Placed This Encounter  Procedures  . CERVICAL FACET (MEDIAL BRANCH NERVE BLOCK)   . CERVICAL EPIDURAL STEROID INJECTION  . SHOULDER INJECTION  . SUPRASCAPULAR NERVE BLOCK  . LUMBAR FACET(MEDIAL BRANCH NERVE BLOCK) MBNB  . LUMBAR EPIDURAL STEROID INJECTION  . ToxASSURE Select 13 (MW), Urine  . Vitamin B12  . 25-Hydroxyvitamin D Lcms D2+D3  . Magnesium    Imaging Ordered: None  Interventional Therapies: Scheduled:  None at this time.    Considering:   1. Diagnostic bilateral cervical facet block under fluoroscopic guidance and IV sedation.  2. Possible bilateral cervical facet radiofrequency ablation under fluoroscopic guidance and IV sedation.  3. Diagnostic right-sided cervical epidural steroid injection under fluoroscopic guidance, with or without sedation.  4. Diagnostic  bilateral intra-articular shoulder joint injection under fluoroscopic guidance, without without IV sedation. 5. Diagnostic bilateral suprascapular nerve block under fluoroscopic guidance, with or without sedation. 6. Possible bilateral suprascapular nerve radiofrequency ablation under fluoroscopic guidance and IV sedation. 7. Diagnostic bilateral lumbar facet block under fluoroscopic guidance and IV sedation.  8. Possible bilateral lumbar facet radiofrequency ablation under fluoroscopic guidance and IV sedation.  9. Diagnostic left sided carpal tunnel injection without fluoroscopy or IV sedation.    PRN Procedures:   1. Diagnostic bilateral cervical facet block under fluoroscopic guidance and IV sedation.  2. Diagnostic right-sided cervical epidural steroid injection under fluoroscopic guidance, with or without sedation.  3. Diagnostic bilateral intra-articular shoulder joint injection under fluoroscopic guidance, without without IV sedation. 4. Diagnostic bilateral suprascapular nerve block under fluoroscopic guidance, with or without sedation. 5. Diagnostic bilateral lumbar facet block under fluoroscopic guidance and IV sedation.  6. Diagnostic left sided L4-5 lumbar epidural steroid injection under fluoroscopic guidance, without without sedation. 7. Diagnostic left sided carpal tunnel injection without fluoroscopy or IV sedation.    Referral(s) or Consult(s): None at this time.  New Prescriptions   No medications on file    Medications administered during this visit: Ms. Thaker had no medications administered during this visit.  Requested PM Follow-up: Return in about 3 months (around 06/13/2016) for Medication Management, (3-Mo), Procedure (PRN - Patient will call).  Future Appointments Date Time Provider [place]  06/13/2016 11:20 AM Milinda Pointer, MD [facility] None    Primary Care Physician: Baltazar Apo, MD Location: [facility] Outpatient Pain Management Facility Note  by: Kathlen Brunswick Dossie Arbour, M.D, DABA, DABAPM, DABPM, DABIPP, FIPP  Pain Score Disclaimer: We use the NRS-11 scale. This is a self-reported, subjective measurement of pain severity with only modest accuracy. It is used primarily to identify changes within a particular patient. It must be understood that outpatient pain scales are significantly less accurate that those used for research, where they can be applied under ideal controlled circumstances with minimal exposure to variables. In reality, the score is likely to be a combination of pain intensity and pain affect, where pain affect describes the degree of emotional arousal or changes in action readiness caused by the sensory experience of pain. Factors such as social and work situation, setting, emotional state, anxiety levels, expectation, and prior pain experience may influence pain perception and show large inter-individual differences that may also be affected by time variables.  Patient instructions provided during this appointment: There are no Patient Instructions on file for this visit.

## 2016-03-09 NOTE — Progress Notes (Signed)
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  Developed an infection on chest, stomach, shoulders and back. Patient has been on medication cream and states infection is not clearing up.  Patient states that is why she was unable to do the things Dr Dossie Arbour wanted her to do.

## 2016-03-09 NOTE — Progress Notes (Signed)
Safety precautions to be maintained throughout the outpatient stay will include: orient to surroundings, keep bed in low position, maintain call bell within reach at all times, provide assistance with transfer out of bed and ambulation. Hydrocodone pill count # 139/180  Filled 03-01-16

## 2016-03-12 LAB — 25-HYDROXYVITAMIN D LCMS D2+D3: 25-HYDROXY, VITAMIN D: 49 ng/mL

## 2016-03-12 LAB — 25-HYDROXY VITAMIN D LCMS D2+D3
25-Hydroxy, Vitamin D-2: <1 ng/mL
25-Hydroxy, Vitamin D-3: 48 ng/mL

## 2016-03-17 LAB — TOXASSURE SELECT 13 (MW), URINE: PDF: 0

## 2016-03-19 DIAGNOSIS — M47812 Spondylosis without myelopathy or radiculopathy, cervical region: Secondary | ICD-10-CM | POA: Insufficient documentation

## 2016-03-21 NOTE — Progress Notes (Signed)
Quick Note:   Normal Vitamin B-12 levels are between 211 and 946 pg/mL, for our Lab. Medical conditions that can increase levels of vitamin B12 include liver disease, kidney failure and myeloproliferative disorders, which includes myelocytic leukemia and polycythemia vera.  ______ 

## 2016-03-23 ENCOUNTER — Encounter: Payer: Medicare Other | Admitting: Pain Medicine

## 2016-05-27 ENCOUNTER — Telehealth: Payer: Self-pay

## 2016-05-27 NOTE — Telephone Encounter (Signed)
Notified Zoe Sanchez at pharmacy

## 2016-05-27 NOTE — Telephone Encounter (Signed)
Ok to fill today but must last until the usual 30 day period. Patient is on delivery system with this pharmacy and delivery could not be made on her usual fill date. Dr. Dossie Arbour made aware.

## 2016-05-27 NOTE — Telephone Encounter (Signed)
Chris from pharmacy called and wants to know if it is ok to fill this script one day earlier. Patient is on the delivery system and today is the final day until Monday that they do delivery and she is due to fill tomorrow. Please advise. Pharmacy number [phone]

## 2016-06-13 ENCOUNTER — Encounter: Payer: Self-pay | Admitting: Pain Medicine

## 2016-06-13 ENCOUNTER — Ambulatory Visit: Payer: Medicare Other | Attending: Pain Medicine | Admitting: Pain Medicine

## 2016-06-13 VITALS — BP 127/38 | HR 61 | Temp 97.8°F | Resp 16 | Ht 62.0 in | Wt 155.0 lb

## 2016-06-13 DIAGNOSIS — G8929 Other chronic pain: Secondary | ICD-10-CM

## 2016-06-13 DIAGNOSIS — M5412 Radiculopathy, cervical region: Secondary | ICD-10-CM

## 2016-06-13 DIAGNOSIS — M7918 Myalgia, other site: Secondary | ICD-10-CM

## 2016-06-13 DIAGNOSIS — E612 Magnesium deficiency: Secondary | ICD-10-CM

## 2016-06-13 DIAGNOSIS — M542 Cervicalgia: Secondary | ICD-10-CM

## 2016-06-13 DIAGNOSIS — Z79891 Long term (current) use of opiate analgesic: Secondary | ICD-10-CM

## 2016-06-13 DIAGNOSIS — M797 Fibromyalgia: Secondary | ICD-10-CM

## 2016-06-13 DIAGNOSIS — G894 Chronic pain syndrome: Secondary | ICD-10-CM

## 2016-06-13 DIAGNOSIS — M545 Low back pain: Secondary | ICD-10-CM

## 2016-06-13 DIAGNOSIS — F1190 Opioid use, unspecified, uncomplicated: Secondary | ICD-10-CM

## 2016-06-13 MED ORDER — MAGNESIUM OXIDE -MG SUPPLEMENT 500 MG PO CAPS: 1.0000 | ORAL_CAPSULE | Freq: Every day | ORAL | 0 refills | Status: DC

## 2016-06-13 MED ORDER — PREGABALIN 100 MG PO CAPS: 100.0000 mg | ORAL_CAPSULE | Freq: Every day | ORAL | 2 refills | Status: DC

## 2016-06-13 MED ORDER — PREGABALIN 25 MG PO CAPS: 25.0000 mg | ORAL_CAPSULE | Freq: Every day | ORAL | 2 refills | Status: DC

## 2016-06-13 MED ORDER — HYDROCODONE-ACETAMINOPHEN 5-325 MG PO TABS: 1.0000 | ORAL_TABLET | ORAL | 0 refills | Status: DC | PRN

## 2016-06-13 MED ORDER — CARISOPRODOL 350 MG PO TABS: 350.0000 mg | ORAL_TABLET | Freq: Three times a day (TID) | ORAL | 2 refills | Status: DC

## 2016-06-13 NOTE — Progress Notes (Signed)
Safety precautions to be maintained throughout the outpatient stay will include: orient to surroundings, keep bed in low position, maintain call bell within reach at all times, provide assistance with transfer out of bed and ambulation.  

## 2016-06-13 NOTE — Progress Notes (Signed)
Patient's Name: Zoe Sanchez  MRN: [id number]  Referring Provider: Denton Lank, MD  DOB: 12/31/1955  PCP: Baltazar Apo, MD  DOS: 06/13/2016  Note by: Kathlen Brunswick. Dossie Arbour, MD  Service setting: Ambulatory outpatient  Specialty: Interventional Pain Management  Location: [facility] (AMB) Pain Management Facility    Patient type: Established   Primary Reason(s) for Visit: Encounter for prescription drug management (Level of risk: moderate) CC: Neck Pain  HPI  Ms. Solheim is a 60 y.o. year old, female patient, who comes today for an initial evaluation. She has Abnormal mammogram; Microcalcifications of the breast; Encounter for therapeutic drug level monitoring; Long term current use of opiate analgesic; Opiate use (30 MME/Day); Magnesium deficiency; Vitamin B12 deficiency; Myofascial pain syndrome; Cervical facet syndrome (Location of Primary Source of Pain) (Bilateral) (R>L); Chronic neck pain (Location of Primary Source of Pain) (Bilateral) (R>L); Chronic pain syndrome; Cervical spondylosis; Anxiety; Awareness of heartbeats; Paroxysmal supraventricular tachycardia ([place]); Breath shortness; Syncope and collapse; Fibromyalgia; Cervical herniated disc (C5-6 and C6-7); Cervical foraminal stenosis (Bilateral) (C5-6); Chronic cervical radicular pain (Location of Secondary source of pain) (Bilateral) (R>L) (C8 Dermatome); Chronic low back pain (Location of Tertiary source of pain) (Bilateral) (R>L); Lumbar spondylosis; Lower extremity pain (Bilateral) (L>R); Coronary atherosclerosis of native coronary artery; Generalized anxiety disorder; Intermittent urinary incontinence; History of psychiatric disorder; Depression; Family history of chronic pain; History of attempted suicide; History of suicidal ideation; Irritable bowel syndrome; Intermittent diarrhea; Chronic constipation; Insomnia; History of hiatal hernia; Chronic pain; Long term prescription opiate use; Vitamin D insufficiency; Opioid-induced constipation (OIC);  Carpal tunnel syndrome (Left); Claustrophobia; Chronic upper extremity pain (Location of Secondary source of pain) (Bilateral) (R>L); Chronic shoulder pain (Bilateral); and Cervical facet arthropathy on her problem list.. Her primarily concern today is the Neck Pain  Pain Assessment: Self-Reported Pain Score: 2 /10             Reported level is compatible with observation.       Pain Type: Chronic pain Pain Location: Neck Pain Orientation: Right, Lower Pain Descriptors / Indicators: Aching, Sharp Pain Frequency: Constant  The patient comes into the clinics today for pharmacological management of her chronic pain. I last saw this patient on 03/09/2016. The patient  reports that she does not use drugs. Her body mass index is 28.35 kg/m.  Patient believes that since she started the Lyrica, her diastolic pressure has ran low. Having said this, she wants to stay on it. I asked her about symptoms and she says that she occasionally will feel lightheaded. Because this is not a typical side effect of Lyrica, I asked her if she was taking any antihypertensive and she denied taking any, however, in reviewing her medications, she is on 160 mg of propanolol at bedtime and another 160 mg thru the day, for a total of 320 mg/day of propanolol. Today I took the time to inform her about the CDC opioid Guidelines and how we need to work on tapering down and stopping or simply stopping the Ambien and the [employer]. Today I told her that I will not continue writing for the Soma or the Ambien in the future.  Date of Last Visit: 03/09/16 Service Provided on Last Visit: Med Refill  Controlled Substance Pharmacotherapy Assessment & REMS (Risk Evaluation and Mitigation Strategy)  Analgesic: Hydrocodone/APAP 5/325 one cue 4 hours (6 per day) (30 mg/day) MME/day: 30 mg/day.  Pill Count: Bottle Labeled hydrocodone 5/325 mg # [id number] last filled 05/27/2016. Pharmacokinetics: Onset of action (Liberation/Absorption): Within expected  pharmacological  parameters Time to Peak effect (Distribution): Timing and results are as within normal expected parameters Duration of action (Metabolism/Excretion): Within normal limits for medication Pharmacodynamics: Analgesic Effect: More than 50% Activity Facilitation: Medication(s) allow patient to sit, stand, walk, and do the basic ADLs Perceived Effectiveness: Described as relatively effective, allowing for increase in activities of daily living (ADL) Side-effects or Adverse reactions: None reported Monitoring: [place] PMP: Online review of the past 39-month period conducted. Compliant with practice rules and regulations List of all UDS test(s) done:  Lab Results  Component Value Date   TOXASSSELUR FINAL 03/09/2016   TOXASSSELUR FINAL 09/28/2015   Last UDS on record: ToxAssure Select 13  Date Value Ref Range Status  03/09/2016 FINAL  Final    Comment:    ==================================================================== TOXASSURE SELECT 13 (MW) ==================================================================== Test                             Result       Flag       Units Drug Present and Declared for Prescription Verification   Hydrocodone                    >9434        EXPECTED   ng/mg creat   Hydromorphone                  443          EXPECTED   ng/mg creat   Dihydrocodeine                 459          EXPECTED   ng/mg creat   Norhydrocodone                 >4717        EXPECTED   ng/mg creat    Sources of hydrocodone include scheduled prescription    medications. Hydromorphone, dihydrocodeine and norhydrocodone are    expected metabolites of hydrocodone. Hydromorphone and    dihydrocodeine are also available as scheduled prescription    medications. Drug Absent but Declared for Prescription Verification   Diazepam                       Not Detected UNEXPECTED ng/mg creat ==================================================================== Test                       Result    Flag   Units      Ref Range   Creatinine              106              mg/dL      >=20 ==================================================================== Declared Medications:  The flagging and interpretation on this report are based on the  following declared medications.  Unexpected results may arise from  inaccuracies in the declared medications.  **Note: The testing scope of this panel includes these medications:  Diazepam (Valium)  Hydrocodone (Norco)  **Note: The testing scope of this panel does not include following  reported medications:  Acetaminophen (Norco)  Amitriptyline (Elavil)  Carisoprodol (Soma)  Cholecalciferol  Cyanocobalamin  Estradiol (Estrace)  Gemfibrozil (Lopid)  Linaclotide (Linzess)  Magnesium (Mag-Ox)  Montelukast (Singulair)  Omeprazole  Pregabalin (Lyrica)  Propranolol (Inderal)  Salmeterol (Serevent)  Umeclidinium (Incruse Ellipta)  Zolpidem (Ambien) ==================================================================== For clinical consultation, please call [phone]. ====================================================================  UDS interpretation: Compliant          Medication Assessment Form: Reviewed. Patient indicates being compliant with therapy Treatment compliance: Compliant Risk Assessment: Aberrant Behavior: None observed today Substance Use Disorder (SUD) Risk Level: Low-to-moderate Risk of opioid abuse or dependence: 0.7-3.0% with doses ? 36 MME/day and 6.1-26% with doses ? 120 MME/day. Opioid Risk Tool (ORT) Score: 0   Low Risk for SUD (Score <3) Depression Scale Score: PHQ-2: 0   No depression (0) PHQ-9: 0   No depression (0-4)  Pharmacologic Plan: No change in therapy, at this time  Laboratory Chemistry  Inflammation Markers Lab Results  Component Value Date   ESRSEDRATE 26 09/28/2015   Renal Function Lab Results  Component Value Date   BUN 11 09/28/2015   CREATININE 0.70 09/28/2015   GFRAA  >60 09/28/2015   GFRNONAA >60 09/28/2015   Hepatic Function Lab Results  Component Value Date   AST 19 09/28/2015   ALT 22 09/28/2015   ALBUMIN 3.5 09/28/2015   Electrolytes Lab Results  Component Value Date   NA 137 09/28/2015   K 4.2 09/28/2015   CL 106 09/28/2015   CALCIUM 8.9 09/28/2015   MG 1.8 03/09/2016   Pain Modulating Vitamins Lab Results  Component Value Date   25OHVITD1 49 03/09/2016   25OHVITD2 <1.0 03/09/2016   25OHVITD3 48 03/09/2016   VITAMINB12 942 (H) 03/09/2016   Coagulation Parameters Lab Results  Component Value Date   INR 0.9 03/06/2012   LABPROT 12.8 03/06/2012   PLT 415 11/09/2012   Cardiovascular Lab Results  Component Value Date   HGB 12.3 11/09/2012   HCT 36.1 11/09/2012    Note: Lab results reviewed.  Recent Diagnostic Imaging  No results found. Meds  The patient has a current medication list which includes the following prescription(s): amitriptyline, carisoprodol, vitamin d3, cyanocobalamin, desonide, dicyclomine, estradiol, gemfibrozil, hydrocodone-acetaminophen, hydrocodone-acetaminophen, hydrocodone-acetaminophen, incruse ellipta, magnesium oxide, metronidazole, montelukast, omeprazole, pregabalin, pregabalin, propranolol, propranolol er, salmeterol, and zolpidem.  Current Outpatient Prescriptions on File Prior to Visit  Medication Sig  . amitriptyline (ELAVIL) 100 MG tablet 200 mg at bedtime.   . Cholecalciferol (VITAMIN D3) 2000 units capsule Take 1 capsule (2,000 Units total) by mouth daily.  . cyanocobalamin ([employer] VITAMIN B12) 2000 MCG tablet Take 1 tablet (2,000 mcg total) by mouth daily.  Marland Kitchen gemfibrozil (LOPID) 600 MG tablet Take by mouth 2 (two) times daily before a meal.   . INCRUSE ELLIPTA 62.5 MCG/INH AEPB   . montelukast (SINGULAIR) 10 MG tablet Take by mouth at bedtime.   Marland Kitchen omeprazole (PRILOSEC) 20 MG capsule Take 20 mg by mouth 2 (two) times daily before a meal.   . propranolol (INDERAL) 40 MG tablet Take 40 mg by  mouth 4 (four) times daily. Takes 5 tabs five times per day.  . zolpidem (AMBIEN) 10 MG tablet 10 mg at bedtime.    No current facility-administered medications on file prior to visit.    ROS  Constitutional: Denies any fever or chills Gastrointestinal: No reported hemesis, hematochezia, vomiting, or acute GI distress Musculoskeletal: Denies any acute onset joint swelling, redness, loss of ROM, or weakness Neurological: No reported episodes of acute onset apraxia, aphasia, dysarthria, agnosia, amnesia, paralysis, loss of coordination, or loss of consciousness  Allergies  Ms. Henriquez is allergic to prednisone and other.  [place]  Medical:  Ms. Manner  has a past medical history of Abnormal heart rhythm; Anxiety; Asthma; CAD (coronary artery disease); Cancer ([place]); Cervical radicular pain (Location of Secondary source  of pain) (Bilateral) (R>L) (C8 Dermatome) (06/29/2015); Chronic pain; COPD (chronic obstructive pulmonary disease) ([place]); Degenerative disk disease; Depression; Endometriosis; Family history of chronic pain (06/29/2015); Fibromyalgia; Fibromyalgia; GERD (gastroesophageal reflux disease); Hiatal hernia; History of attempted suicide (06/29/2015); History of hiatal hernia (06/29/2015); History of psychiatric disorder (06/29/2015); History of suicidal ideation (06/29/2015); Hyperlipidemia; Insomnia; Insomnia; and Irritable bowel syndrome. Family: family history includes Breast cancer in her maternal aunt; Heart disease in her father; Hypertension in her father and mother. Surgical:  has a past surgical history that includes Abdominal hysterectomy (1986); Tonsillectomy (1957); Skin lesion excision; Breast surgery; and Breast biopsy (Right, 2016). Tobacco:  reports that she has never smoked. She has never used smokeless tobacco. Alcohol:  reports that she does not drink alcohol. Drug:  reports that she does not use drugs.  Constitutional Exam  General appearance: Well nourished, well  developed, and well hydrated. In no acute distress Vitals:   06/13/16 1205  BP: (!) 127/38  Pulse: 61  Resp: 16  Temp: 97.8 F (36.6 C)  SpO2: 100%  Weight: 155 lb (70.3 kg)  Height: 5\' 2"  (1.575 m)  BMI Assessment: Estimated body mass index is 28.35 kg/m as calculated from the following:   Height as of this encounter: 5\' 2"  (1.575 m).   Weight as of this encounter: 155 lb (70.3 kg).   BMI interpretation: (25-29.9 kg/m2) = Overweight: This range is associated with a 20% higher incidence of chronic pain. BMI Readings from Last 4 Encounters:  06/13/16 28.35 kg/m  03/09/16 27.46 kg/m  12/23/15 26.57 kg/m  09/28/15 27.44 kg/m   Wt Readings from Last 4 Encounters:  06/13/16 155 lb (70.3 kg)  03/09/16 155 lb (70.3 kg)  12/23/15 150 lb (68 kg)  09/28/15 150 lb (68 kg)  Psych/Mental status: Alert and oriented x 3 (person, place, & time) Eyes: PERLA Respiratory: No evidence of acute respiratory distress  Cervical Spine Exam  Inspection: No masses, redness, or swelling Alignment: Symmetrical Functional ROM: Unrestricted ROM Stability: No instability detected Muscle strength & Tone: Functionally intact Sensory: Unimpaired Palpation: Non-contributory  Upper Extremity (UE) Exam    Side: Right upper extremity  Side: Left upper extremity  Inspection: No masses, redness, swelling, or asymmetry  Inspection: No masses, redness, swelling, or asymmetry  Functional ROM: Unrestricted ROM         Functional ROM: Unrestricted ROM          Muscle strength & Tone: Functionally intact  Muscle strength & Tone: Functionally intact  Sensory: Unimpaired  Sensory: Unimpaired  Palpation: Non-contributory  Palpation: Non-contributory   Thoracic Spine Exam  Inspection: No masses, redness, or swelling Alignment: Symmetrical Functional ROM: Unrestricted ROM Stability: No instability detected Sensory: Unimpaired Muscle strength & Tone: Functionally intact Palpation: Non-contributory  Lumbar  Spine Exam  Inspection: No masses, redness, or swelling Alignment: Symmetrical Functional ROM: Decreased ROM Stability: No instability detected Muscle strength & Tone: Functionally intact Sensory: Movement-associated pain Palpation: Complains of area being tender to palpation Provocative Tests: Lumbar Hyperextension and rotation test: evaluation deferred today       Patrick's Maneuver: evaluation deferred today              Gait & Posture Assessment  Ambulation: Unassisted Gait: Relatively normal for age and body habitus Posture: WNL   Lower Extremity Exam    Side: Right lower extremity  Side: Left lower extremity  Inspection: No masses, redness, swelling, or asymmetry  Inspection: No masses, redness, swelling, or asymmetry  Functional ROM: Unrestricted ROM  Functional ROM: Unrestricted ROM          Muscle strength & Tone: Functionally intact  Muscle strength & Tone: Functionally intact  Sensory: Unimpaired  Sensory: Unimpaired  Palpation: Non-contributory  Palpation: Non-contributory   Assessment  Primary Diagnosis & Pertinent Problem List: The primary encounter diagnosis was Other chronic pain. Diagnoses of Chronic pain syndrome, Long term current use of opiate analgesic, Opiate use (30 MME/Day), Chronic neck pain (Location of Primary Source of Pain) (Bilateral) (R>L), Chronic cervical radicular pain (Location of Secondary source of pain) (Bilateral) (R>L) (C8 Dermatome), Chronic low back pain without sciatica, unspecified back pain laterality, Fibromyalgia, Myofascial pain syndrome, and Magnesium deficiency were also pertinent to this visit.  Visit Diagnosis: 1. Other chronic pain   2. Chronic pain syndrome   3. Long term current use of opiate analgesic   4. Opiate use (30 MME/Day)   5. Chronic neck pain (Location of Primary Source of Pain) (Bilateral) (R>L)   6. Chronic cervical radicular pain (Location of Secondary source of pain) (Bilateral) (R>L) (C8 Dermatome)   7.  Chronic low back pain without sciatica, unspecified back pain laterality   8. Fibromyalgia   9. Myofascial pain syndrome   10. Magnesium deficiency    Plan of Care  Pharmacotherapy (Medications Ordered): Meds ordered this encounter  Medications  . HYDROcodone-acetaminophen (NORCO/VICODIN) 5-325 MG tablet    Sig: Take 1 tablet by mouth every 4 (four) hours as needed for severe pain.    Dispense:  180 tablet    Refill:  0    Do not place this medication, or any other prescription from our practice, on "Automatic Refill". Patient may have prescription filled one day early if pharmacy is closed on scheduled refill date. Do not fill until: 06/27/16 To last until: 07/27/16  . HYDROcodone-acetaminophen (NORCO/VICODIN) 5-325 MG tablet    Sig: Take 1 tablet by mouth every 4 (four) hours as needed for severe pain.    Dispense:  180 tablet    Refill:  0    Do not place this medication, or any other prescription from our practice, on "Automatic Refill". Patient may have prescription filled one day early if pharmacy is closed on scheduled refill date. Do not fill until: 07/27/16 To last until: 08/26/16  . HYDROcodone-acetaminophen (NORCO/VICODIN) 5-325 MG tablet    Sig: Take 1 tablet by mouth every 4 (four) hours as needed for severe pain.    Dispense:  180 tablet    Refill:  0    Do not place this medication, or any other prescription from our practice, on "Automatic Refill". Patient may have prescription filled one day early if pharmacy is closed on scheduled refill date. Do not fill until: 08/26/16 To last until: 09/25/16  . pregabalin (LYRICA) 100 MG capsule    Sig: Take 1 capsule (100 mg total) by mouth at bedtime.    Dispense:  30 capsule    Refill:  2    Do not place this medication, or any other prescription from our practice, on "Automatic Refill". Patient may have prescription filled one day early if pharmacy is closed on scheduled refill date.  . pregabalin (LYRICA) 25 MG capsule     Sig: Take 1 capsule (25 mg total) by mouth at bedtime.    Dispense:  30 capsule    Refill:  2    Do not place this medication, or any other prescription from our practice, on "Automatic Refill". Patient may have prescription filled one day early if pharmacy  is closed on scheduled refill date.  . carisoprodol (SOMA) 350 MG tablet    Sig: Take 1 tablet (350 mg total) by mouth 3 (three) times daily.    Dispense:  90 tablet    Refill:  2    Do not place this medication, or any other prescription from our practice, on "Automatic Refill". Patient may have prescription filled one day early if pharmacy is closed on scheduled refill date.  . Magnesium Oxide 500 MG CAPS    Sig: Take 1 capsule (500 mg total) by mouth daily.    Dispense:  90 capsule    Refill:  0    Do not place this medication, or any other prescription from our practice, on "Automatic Refill". Patient may have prescription filled one day early if pharmacy is closed on scheduled refill date.   New Prescriptions   No medications on file   Medications administered during this visit: Ms. Bolton had no medications administered during this visit. Lab-work, Procedure(s), & Referral(s) Ordered: No orders of the defined types were placed in this encounter.  Imaging & Referral(s) Ordered: None  Interventional Therapies: Scheduled:  None at this time.    Considering:  Diagnostic bilateral cervical facet block under fluoroscopic guidance and IV sedation.  Possible bilateral cervical facet radiofrequency ablation under fluoroscopic guidance and IV sedation.  Diagnostic right-sided cervical epidural steroid injection under fluoroscopic guidance, with or without sedation.  Diagnostic bilateral intra-articular shoulder joint injection under fluoroscopic guidance, without without IV sedation. Diagnostic bilateral suprascapular nerve block under fluoroscopic guidance, with or without sedation. Possible bilateral suprascapular nerve  radiofrequency ablation under fluoroscopic guidance and IV sedation. Diagnostic bilateral lumbar facet block under fluoroscopic guidance and IV sedation.  Possible bilateral lumbar facet radiofrequency ablation under fluoroscopic guidance and IV sedation.  Diagnostic left sided carpal tunnel injection without fluoroscopy or IV sedation.    PRN Procedures:  Diagnostic bilateral cervical facet block under fluoroscopic guidance and IV sedation.  Diagnostic right-sided cervical epidural steroid injection under fluoroscopic guidance, with or without sedation.  Diagnostic bilateral intra-articular shoulder joint injection under fluoroscopic guidance, without without IV sedation. Diagnostic bilateral suprascapular nerve block under fluoroscopic guidance, with or without sedation. Diagnostic bilateral lumbar facet block under fluoroscopic guidance and IV sedation.  Diagnostic left sided L4-5 lumbar epidural steroid injection under fluoroscopic guidance, without without sedation. Diagnostic left sided carpal tunnel injection without fluoroscopy or IV sedation.    Requested PM Follow-up: No Follow-up on file.  Future Appointments Date Time Provider [place]  09/13/2016 1:00 PM Milinda Pointer, MD [facility] None   Primary Care Physician: Baltazar Apo, MD Location: [facility] Note by: Kathlen Brunswick. Dossie Arbour, M.D, DABA, DABAPM, DABPM, DABIPP, FIPP  Pain Score Disclaimer: We use the NRS-11 scale. This is a self-reported, subjective measurement of pain severity with only modest accuracy. It is used primarily to identify changes within a particular patient. It must be understood that outpatient pain scales are significantly less accurate that those used for research, where they can be applied under ideal controlled circumstances with minimal exposure to variables. In reality, the score is likely to be a combination of pain intensity and pain affect, where pain affect  describes the degree of emotional arousal or changes in action readiness caused by the sensory experience of pain. Factors such as social and work situation, setting, emotional state, anxiety levels, expectation, and prior pain experience may influence pain perception and show large inter-individual differences that may also be affected by time variables.  Patient instructions provided during this appointment: There are no Patient Instructions on file for this visit.

## 2016-06-13 NOTE — Progress Notes (Signed)
Bottle Labeled hydrocodone 5/325 mg # [id number] last filled 05/27/2016

## 2016-07-21 DIAGNOSIS — S82842A Displaced bimalleolar fracture of left lower leg, initial encounter for closed fracture: Secondary | ICD-10-CM | POA: Insufficient documentation

## 2016-08-19 ENCOUNTER — Ambulatory Visit: Payer: Medicare Other | Attending: Pain Medicine | Admitting: Pain Medicine

## 2016-08-19 ENCOUNTER — Encounter: Payer: Self-pay | Admitting: Pain Medicine

## 2016-08-19 VITALS — BP 107/59 | HR 77 | Temp 97.8°F | Resp 16 | Ht 63.0 in | Wt 151.0 lb

## 2016-08-19 DIAGNOSIS — G5602 Carpal tunnel syndrome, left upper limb: Secondary | ICD-10-CM | POA: Diagnosis not present

## 2016-08-19 DIAGNOSIS — Z888 Allergy status to other drugs, medicaments and biological substances status: Secondary | ICD-10-CM | POA: Diagnosis not present

## 2016-08-19 DIAGNOSIS — E538 Deficiency of other specified B group vitamins: Secondary | ICD-10-CM | POA: Diagnosis not present

## 2016-08-19 DIAGNOSIS — I471 Supraventricular tachycardia: Secondary | ICD-10-CM | POA: Diagnosis not present

## 2016-08-19 DIAGNOSIS — E785 Hyperlipidemia, unspecified: Secondary | ICD-10-CM | POA: Insufficient documentation

## 2016-08-19 DIAGNOSIS — G894 Chronic pain syndrome: Secondary | ICD-10-CM | POA: Diagnosis present

## 2016-08-19 DIAGNOSIS — F411 Generalized anxiety disorder: Secondary | ICD-10-CM | POA: Diagnosis not present

## 2016-08-19 DIAGNOSIS — M4802 Spinal stenosis, cervical region: Secondary | ICD-10-CM | POA: Insufficient documentation

## 2016-08-19 DIAGNOSIS — Z90710 Acquired absence of both cervix and uterus: Secondary | ICD-10-CM | POA: Insufficient documentation

## 2016-08-19 DIAGNOSIS — M791 Myalgia: Secondary | ICD-10-CM | POA: Diagnosis not present

## 2016-08-19 DIAGNOSIS — Z8249 Family history of ischemic heart disease and other diseases of the circulatory system: Secondary | ICD-10-CM | POA: Insufficient documentation

## 2016-08-19 DIAGNOSIS — E612 Magnesium deficiency: Secondary | ICD-10-CM | POA: Diagnosis not present

## 2016-08-19 DIAGNOSIS — J449 Chronic obstructive pulmonary disease, unspecified: Secondary | ICD-10-CM | POA: Insufficient documentation

## 2016-08-19 DIAGNOSIS — M5412 Radiculopathy, cervical region: Secondary | ICD-10-CM | POA: Insufficient documentation

## 2016-08-19 DIAGNOSIS — K5909 Other constipation: Secondary | ICD-10-CM | POA: Insufficient documentation

## 2016-08-19 DIAGNOSIS — G8929 Other chronic pain: Secondary | ICD-10-CM

## 2016-08-19 DIAGNOSIS — M545 Low back pain: Secondary | ICD-10-CM | POA: Insufficient documentation

## 2016-08-19 DIAGNOSIS — K589 Irritable bowel syndrome without diarrhea: Secondary | ICD-10-CM | POA: Insufficient documentation

## 2016-08-19 DIAGNOSIS — F329 Major depressive disorder, single episode, unspecified: Secondary | ICD-10-CM | POA: Insufficient documentation

## 2016-08-19 DIAGNOSIS — Z803 Family history of malignant neoplasm of breast: Secondary | ICD-10-CM | POA: Diagnosis not present

## 2016-08-19 DIAGNOSIS — Z79891 Long term (current) use of opiate analgesic: Secondary | ICD-10-CM | POA: Insufficient documentation

## 2016-08-19 DIAGNOSIS — I2510 Atherosclerotic heart disease of native coronary artery without angina pectoris: Secondary | ICD-10-CM | POA: Insufficient documentation

## 2016-08-19 DIAGNOSIS — E559 Vitamin D deficiency, unspecified: Secondary | ICD-10-CM | POA: Diagnosis not present

## 2016-08-19 DIAGNOSIS — M7918 Myalgia, other site: Secondary | ICD-10-CM

## 2016-08-19 DIAGNOSIS — M797 Fibromyalgia: Secondary | ICD-10-CM

## 2016-08-19 MED ORDER — PREGABALIN 100 MG PO CAPS: 100.0000 mg | ORAL_CAPSULE | Freq: Every day | ORAL | 2 refills | Status: DC

## 2016-08-19 MED ORDER — CYANOCOBALAMIN 2000 MCG PO TABS: 2000.0000 ug | ORAL_TABLET | Freq: Every day | ORAL | 2 refills | Status: DC

## 2016-08-19 MED ORDER — HYDROCODONE-ACETAMINOPHEN 5-325 MG PO TABS: 1.0000 | ORAL_TABLET | ORAL | 0 refills | Status: DC | PRN

## 2016-08-19 MED ORDER — CARISOPRODOL 350 MG PO TABS: 350.0000 mg | ORAL_TABLET | Freq: Three times a day (TID) | ORAL | 2 refills | Status: DC

## 2016-08-19 MED ORDER — VITAMIN D3 50 MCG (2000 UT) PO CAPS: 2000.0000 [IU] | ORAL_CAPSULE | Freq: Every day | ORAL | 2 refills | Status: DC

## 2016-08-19 MED ORDER — PREGABALIN 25 MG PO CAPS: 25.0000 mg | ORAL_CAPSULE | Freq: Every day | ORAL | 2 refills | Status: DC

## 2016-08-19 MED ORDER — MAGNESIUM OXIDE -MG SUPPLEMENT 500 MG PO CAPS: 1.0000 | ORAL_CAPSULE | Freq: Every day | ORAL | 0 refills | Status: DC

## 2016-08-19 NOTE — Progress Notes (Signed)
Nursing Pain Medication Assessment:  Safety precautions to be maintained throughout the outpatient stay will include: orient to surroundings, keep bed in low position, maintain call bell within reach at all times, provide assistance with transfer out of bed and ambulation.  Medication Inspection Compliance: Pill count conducted under aseptic conditions, in front of the patient. Neither the pills nor the bottle was removed from the patient's sight at any time. Once count was completed pills were immediately returned to the patient in their original bottle. Pill Count: 53 of 180 pills remain Bottle Appearance: Standard pharmacy container. Clearly labeled. Medication: See above Filled Date: 11 / 15 / 2017

## 2016-08-19 NOTE — Patient Instructions (Signed)

## 2016-08-19 NOTE — Progress Notes (Signed)
Patient's Name: Zoe Sanchez  MRN: [id number]  Referring Provider: Hillery Aldo, MD  DOB: 1956-04-29  PCP: Hillery Aldo, MD  DOS: 08/19/2016  Note by: Sydnee Levans. Laban Emperor, MD  Service setting: Ambulatory outpatient  Specialty: Interventional Pain Management  Location: [facility] (AMB) Pain Management Facility    Patient type: Established   Primary Reason(s) for Visit: Encounter for prescription drug management (Level of risk: moderate) CC: Neck Pain (base) and Back Pain (lower)  HPI  Zoe Sanchez is a 60 y.o. year old, female patient, who comes today for a medication management evaluation. She has Abnormal mammogram; Microcalcifications of the breast; Encounter for therapeutic drug level monitoring; Long term current use of opiate analgesic; Opiate use (30 MME/Day); Magnesium deficiency; Vitamin B12 deficiency; Myofascial pain syndrome; Cervical facet syndrome (Location of Primary Source of Pain) (Bilateral) (R>L); Chronic neck pain (Location of Primary Source of Pain) (Bilateral) (R>L); Chronic pain syndrome; Cervical spondylosis; Anxiety; Awareness of heartbeats; Paroxysmal supraventricular tachycardia (HCC); Breath shortness; Syncope and collapse; Fibromyalgia; Cervical herniated disc (C5-6 and C6-7); Cervical foraminal stenosis (Bilateral) (C5-6); Chronic cervical radicular pain (Location of Secondary source of pain) (Bilateral) (R>L) (C8 Dermatome); Chronic low back pain (Location of Tertiary source of pain) (Bilateral) (R>L); Lumbar spondylosis; Lower extremity pain (Bilateral) (L>R); Coronary atherosclerosis of native coronary artery; Generalized anxiety disorder; Intermittent urinary incontinence; History of psychiatric disorder; Depression; Family history of chronic pain; History of attempted suicide; History of suicidal ideation; Irritable bowel syndrome; Intermittent diarrhea; Chronic constipation; Insomnia; History of hiatal hernia; Chronic pain; Long term prescription opiate use; Vitamin D  insufficiency; 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 Her primarily concern today is the Neck Pain (base) and Back Pain (lower)  Pain Assessment: Self-Reported Pain Score: 3 /10             Reported level is compatible with observation.       Pain Type: Chronic pain Pain Location: Head (sore) Pain Orientation: Lower Pain Descriptors / Indicators: Aching, Headache Pain Frequency: Constant  Zoe Sanchez was last seen on 06/13/2016 for medication management. During today's appointment we reviewed Zoe Sanchez's chronic pain status, as well as her outpatient medication regimen.  The patient  reports that she does not use drugs. Her body mass index is 26.75 kg/m.  Further details on both, my assessment(s), as well as the proposed treatment plan, please see below.  Controlled Substance Pharmacotherapy Assessment REMS (Risk Evaluation and Mitigation Strategy)  Analgesic:Hydrocodone/APAP 5/325 one cue 4 hours (6 per day) (30 mg/day) MME/day:30 mg/day.  Newman Pies, RN  08/19/2016 10:03 AM  Sign at close encounter Nursing Pain Medication Assessment:  Safety precautions to be maintained throughout the outpatient stay will include: orient to surroundings, keep bed in low position, maintain call bell within reach at all times, provide assistance with transfer out of bed and ambulation.  Medication Inspection Compliance: Pill count conducted under aseptic conditions, in front of the patient. Neither the pills nor the bottle was removed from the patient's sight at any time. Once count was completed pills were immediately returned to the patient in their original bottle. Pill Count: 53 of 180 pills remain Bottle Appearance: Standard pharmacy container. Clearly labeled. Medication: See  above Filled Date: 40 / 15 / 2017   Pharmacokinetics: Liberation and absorption (onset of action): WNL Distribution (time to peak effect): WNL Metabolism and excretion (duration of  action): WNL         Pharmacodynamics: Desired effects: Analgesia: Zoe Sanchez reports >50% benefit. Functional ability: Patient reports that medication allows her to accomplish basic ADLs Clinically meaningful improvement in function (CMIF): Sustained CMIF goals met Perceived effectiveness: Described as relatively effective, allowing for increase in activities of daily living (ADL) Undesirable effects: Side-effects or Adverse reactions: None reported Monitoring: [place] PMP: Online review of the past 39-monthperiod conducted. Compliant with practice rules and regulations List of all UDS test(s) done:  Lab Results  Component Value Date   TOXASSSELUR FINAL 03/09/2016   TOXASSSELUR FINAL 09/28/2015   Last UDS on record: ToxAssure Select 13  Date Value Ref Range Status  03/09/2016 FINAL  Final    Comment:    ==================================================================== TOXASSURE SELECT 13 (MW) ==================================================================== Test                             Result       Flag       Units Drug Present and Declared for Prescription Verification   Hydrocodone                    >9434        EXPECTED   ng/mg creat   Hydromorphone                  443          EXPECTED   ng/mg creat   Dihydrocodeine                 459          EXPECTED   ng/mg creat   Norhydrocodone                 >4717        EXPECTED   ng/mg creat    Sources of hydrocodone include scheduled prescription    medications. Hydromorphone, dihydrocodeine and norhydrocodone are    expected metabolites of hydrocodone. Hydromorphone and    dihydrocodeine are also available as scheduled prescription    medications. Drug Absent but Declared for Prescription Verification   Diazepam                       Not  Detected UNEXPECTED ng/mg creat ==================================================================== Test                      Result    Flag   Units      Ref Range   Creatinine              106              mg/dL      >=20 ==================================================================== Declared Medications:  The flagging and interpretation on this report are based on the  following declared medications.  Unexpected results may arise from  inaccuracies in the declared medications.  **Note: The testing scope of this panel includes these medications:  Diazepam (Valium)  Hydrocodone (Norco)  **Note: The testing scope of this panel does not include following  reported medications:  Acetaminophen (Norco)  Amitriptyline (Elavil)  Carisoprodol (Soma)  Cholecalciferol  Cyanocobalamin  Estradiol (Estrace)  Gemfibrozil (Lopid)  Linaclotide (Linzess)  Magnesium (Mag-Ox)  Montelukast (Singulair)  Omeprazole  Pregabalin (Lyrica)  Propranolol (Inderal)  Salmeterol (Serevent)  Umeclidinium (Incruse Ellipta)  Zolpidem (Ambien) ==================================================================== For clinical consultation, please call ([phone] ====================================================================    UDS  interpretation: Compliant Patient informed of the CDC guidelines and recommendations to stay away from the concomitant use of benzodiazepines and opioids due to the increased risk of respiratory depression and death. Medication Assessment Form: Reviewed. Patient indicates being compliant with therapy Treatment compliance: Compliant Risk Assessment Profile: Aberrant behavior: See prior evaluations. None observed or detected today Comorbid factors increasing risk of overdose: See prior notes. No additional risks detected today Risk of substance use disorder (SUD): Low-to-Moderate Opioid Risk Tool (ORT) Total Score: 0  Interpretation Table:  Score <3 = Low Risk for  SUD  Score between 4-7 = Moderate Risk for SUD  Score >8 = High Risk for Opioid Abuse   Risk Mitigation Strategies:  Patient Counseling: Covered Patient-Prescriber Agreement (PPA): Present and active  Notification to other healthcare providers: Done  Pharmacologic Plan: No change in therapy, at this time  Laboratory Chemistry  Inflammation Markers Lab Results  Component Value Date   ESRSEDRATE 26 09/28/2015   Renal Function Lab Results  Component Value Date   BUN 11 09/28/2015   CREATININE 0.70 09/28/2015   GFRAA >60 09/28/2015   GFRNONAA >60 09/28/2015   Hepatic Function Lab Results  Component Value Date   AST 19 09/28/2015   ALT 22 09/28/2015   ALBUMIN 3.5 09/28/2015   Electrolytes Lab Results  Component Value Date   NA 137 09/28/2015   K 4.2 09/28/2015   CL 106 09/28/2015   CALCIUM 8.9 09/28/2015   MG 1.8 03/09/2016   Pain Modulating Vitamins Lab Results  Component Value Date   25OHVITD1 49 03/09/2016   25OHVITD2 <1.0 03/09/2016   25OHVITD3 48 03/09/2016   VITAMINB12 942 (H) 03/09/2016   Coagulation Parameters Lab Results  Component Value Date   INR 0.9 03/06/2012   LABPROT 12.8 03/06/2012   PLT 415 11/09/2012   Cardiovascular Lab Results  Component Value Date   HGB 12.3 11/09/2012   HCT 36.1 11/09/2012   Note: Lab results reviewed.  Recent Diagnostic Imaging Review  No results found. Note: Imaging results reviewed.          Meds  The patient has a current medication list which includes the following prescription(s): amitriptyline, carisoprodol, vitamin d3, cyanocobalamin, desonide, dicyclomine, estradiol, gemfibrozil, hydrocodone-acetaminophen, hydrocodone-acetaminophen, hydrocodone-acetaminophen, incruse ellipta, magnesium oxide, metronidazole, montelukast, omeprazole, pregabalin, pregabalin, propranolol, propranolol er, salmeterol, and zolpidem.  Current Outpatient Prescriptions on File Prior to Visit  Medication Sig  . amitriptyline  (ELAVIL) 100 MG tablet 200 mg at bedtime.   Marland Kitchen desonide (DESOWEN) 0.05 % cream   . dicyclomine (BENTYL) 20 MG tablet   . estradiol (ESTRACE) 1 MG tablet   . gemfibrozil (LOPID) 600 MG tablet Take by mouth 2 (two) times daily before a meal.   . INCRUSE ELLIPTA 62.5 MCG/INH AEPB   . metroNIDAZOLE (FLAGYL) 500 MG tablet   . montelukast (SINGULAIR) 10 MG tablet Take by mouth at bedtime.   Marland Kitchen omeprazole (PRILOSEC) 20 MG capsule Take 20 mg by mouth 2 (two) times daily before a meal.   . propranolol (INDERAL) 40 MG tablet Take 40 mg by mouth 4 (four) times daily. Takes 6 tabs five times per day.  . propranolol ER (INDERAL LA) 80 MG 24 hr capsule Take 160 mg by mouth at bedtime.   . salmeterol (SEREVENT DISKUS) 50 MCG/DOSE diskus inhaler Inhale 1 puff into the lungs 2 (two) times daily.   Marland Kitchen zolpidem (AMBIEN) 10 MG tablet 10 mg at bedtime.    No current facility-administered medications on file prior to visit.  ROS  Constitutional: Denies any fever or chills Gastrointestinal: No reported hemesis, hematochezia, vomiting, or acute GI distress Musculoskeletal: Denies any acute onset joint swelling, redness, loss of ROM, or weakness Neurological: No reported episodes of acute onset apraxia, aphasia, dysarthria, agnosia, amnesia, paralysis, loss of coordination, or loss of consciousness  Allergies  Ms. Angelino is allergic to prednisone and other.  PFSH  Drug: Ms. Serres  reports that she does not use drugs. Alcohol:  reports that she does not drink alcohol. Tobacco:  reports that she has never smoked. She has never used smokeless tobacco. Medical:  has a past medical history of Abnormal heart rhythm; Anxiety; Asthma; CAD (coronary artery disease); Cancer ([place]); Cervical radicular pain (Location of Secondary source of pain) (Bilateral) (R>L) (C8 Dermatome) (06/29/2015); Chronic pain; COPD (chronic obstructive pulmonary disease) ([place]); Degenerative disk disease; Depression; Endometriosis; Family  history of chronic pain (06/29/2015); Fibromyalgia; Fibromyalgia; GERD (gastroesophageal reflux disease); Hiatal hernia; History of attempted suicide (06/29/2015); History of hiatal hernia (06/29/2015); History of psychiatric disorder (06/29/2015); History of suicidal ideation (06/29/2015); Hyperlipidemia; Insomnia; Insomnia; and Irritable bowel syndrome. Family: family history includes Breast cancer in her maternal aunt; Heart disease in her father; Hypertension in her father and mother.  Past Surgical History:  Procedure Laterality Date  . ABDOMINAL HYSTERECTOMY  1986  . BREAST BIOPSY Right 2016   CORE W/CLIP - EARLY INTRADUCTAL PAPILLOMA  . BREAST SURGERY    . SKIN LESION EXCISION     basal cell and squamous cell  . TONSILLECTOMY  1957   Constitutional Exam  General appearance: Well nourished, well developed, and well hydrated. In no apparent acute distress Vitals:   08/19/16 0943 08/19/16 0945  BP: (!) 107/59   Pulse: 77   Resp: 16   Temp: 97.8 F (36.6 C)   TempSrc: Oral   SpO2: 99%   Weight: 155 lb (70.3 kg) 151 lb (68.5 kg)  Height: _0  (1.6 m) _1  (1.6 m)   BMI Assessment: Estimated body mass index is 26.75 kg/m as calculated from the following:   Height as of this encounter: _2  (1.6 m).   Weight as of this encounter: 151 lb (68.5 kg).  BMI interpretation table: BMI level Category Range association with higher incidence of chronic pain  <18 kg/m2 Underweight   18.5-24.9 kg/m2 Ideal body weight   25-29.9 kg/m2 Overweight Increased incidence by 20%  30-34.9 kg/m2 Obese (Class I) Increased incidence by 68%  35-39.9 kg/m2 Severe obesity (Class II) Increased incidence by 136%  >40 kg/m2 Extreme obesity (Class III) Increased incidence by 254%   BMI Readings from Last 4 Encounters:  08/19/16 26.75 kg/m  06/13/16 28.35 kg/m  03/09/16 27.46 kg/m  12/23/15 26.57 kg/m   Wt Readings from Last 4 Encounters:  08/19/16 151 lb (68.5 kg)  06/13/16 155 lb (70.3 kg)   03/09/16 155 lb (70.3 kg)  12/23/15 150 lb (68 kg)  Psych/Mental status: Alert, oriented x 3 (person, place, & time) Eyes: PERLA Respiratory: No evidence of acute respiratory distress  Cervical Spine Exam  Inspection: No masses, redness, or swelling Alignment: Symmetrical Functional ROM: Unrestricted ROM Stability: No instability detected Muscle strength & Tone: Functionally intact Sensory: Unimpaired Palpation: Non-contributory  Upper Extremity (UE) Exam    Side: Right upper extremity  Side: Left upper extremity  Inspection: No masses, redness, swelling, or asymmetry  Inspection: No masses, redness, swelling, or asymmetry  Functional ROM: Unrestricted ROM          Functional ROM: Unrestricted ROM  Muscle strength & Tone: Functionally intact  Muscle strength & Tone: Functionally intact  Sensory: Unimpaired  Sensory: Unimpaired  Palpation: Non-contributory  Palpation: Non-contributory   Thoracic Spine Exam  Inspection: No masses, redness, or swelling Alignment: Symmetrical Functional ROM: Unrestricted ROM Stability: No instability detected Sensory: Unimpaired Muscle strength & Tone: Functionally intact Palpation: Non-contributory  Lumbar Spine Exam  Inspection: No masses, redness, or swelling Alignment: Symmetrical Functional ROM: Unrestricted ROM Stability: No instability detected Muscle strength & Tone: Functionally intact Sensory: Unimpaired Palpation: Non-contributory Provocative Tests: Lumbar Hyperextension and rotation test: evaluation deferred today       Patrick's Maneuver: evaluation deferred today              Gait & Posture Assessment  Ambulation: Unassisted Gait: Relatively normal for age and body habitus Posture: WNL   Lower Extremity Exam    Side: Right lower extremity  Side: Left lower extremity  Inspection: No masses, redness, swelling, or asymmetry  Inspection: No masses, redness, swelling, or asymmetry  Functional ROM: Unrestricted ROM           Functional ROM: Unrestricted ROM          Muscle strength & Tone: Functionally intact  Muscle strength & Tone: Functionally intact  Sensory: Unimpaired  Sensory: Unimpaired  Palpation: Non-contributory  Palpation: Non-contributory   Assessment  Primary Diagnosis & Pertinent Problem List: The primary encounter diagnosis was Chronic pain syndrome. Diagnoses of Fibromyalgia, Magnesium deficiency, Myofascial pain syndrome, Vitamin B12 deficiency, Vitamin D insufficiency, and Other chronic pain were also pertinent to this visit.  Visit Diagnosis: 1. Chronic pain syndrome   2. Fibromyalgia   3. Magnesium deficiency   4. Myofascial pain syndrome   5. Vitamin B12 deficiency   6. Vitamin D insufficiency   7. Other chronic pain    Plan of Care  Pharmacotherapy (Medications Ordered): Meds ordered this encounter  Medications  . pregabalin (LYRICA) 25 MG capsule    Sig: Take 1 capsule (25 mg total) by mouth at bedtime.    Dispense:  30 capsule    Refill:  2    Do not place this medication, or any other prescription from our practice, on "Automatic Refill". Patient may have prescription filled one day early if pharmacy is closed on scheduled refill date.  . pregabalin (LYRICA) 100 MG capsule    Sig: Take 1 capsule (100 mg total) by mouth at bedtime.    Dispense:  30 capsule    Refill:  2    Do not place this medication, or any other prescription from our practice, on "Automatic Refill". Patient may have prescription filled one day early if pharmacy is closed on scheduled refill date.  . Magnesium Oxide 500 MG CAPS    Sig: Take 1 capsule (500 mg total) by mouth daily.    Dispense:  90 capsule    Refill:  0    Do not place this medication, or any other prescription from our practice, on "Automatic Refill". Patient may have prescription filled one day early if pharmacy is closed on scheduled refill date.  . carisoprodol (SOMA) 350 MG tablet    Sig: Take 1 tablet (350 mg total) by mouth 3  (three) times daily.    Dispense:  90 tablet    Refill:  2    Do not place this medication, or any other prescription from our practice, on "Automatic Refill". Patient may have prescription filled one day early if pharmacy is closed on scheduled refill date.  . cyanocobalamin (  [employer] VITAMIN B12) 2000 MCG tablet    Sig: Take 1 tablet (2,000 mcg total) by mouth daily.    Dispense:  30 tablet    Refill:  2    Do not place this medication, or any other prescription from our practice, on "Automatic Refill".  . Cholecalciferol (VITAMIN D3) 2000 units capsule    Sig: Take 1 capsule (2,000 Units total) by mouth daily.    Dispense:  30 capsule    Refill:  2    Do not place this medication, or any other prescription from our practice, on "Automatic Refill".  . HYDROcodone-acetaminophen (NORCO/VICODIN) 5-325 MG tablet    Sig: Take 1 tablet by mouth every 4 (four) hours as needed for severe pain.    Dispense:  180 tablet    Refill:  0    Do not place this medication, or any other prescription from our practice, on "Automatic Refill". Patient may have prescription filled one day early if pharmacy is closed on scheduled refill date. Do not fill until: 09/25/16 To last until: 10/25/16  . HYDROcodone-acetaminophen (NORCO/VICODIN) 5-325 MG tablet    Sig: Take 1 tablet by mouth every 4 (four) hours as needed for severe pain.    Dispense:  180 tablet    Refill:  0    Do not place this medication, or any other prescription from our practice, on "Automatic Refill". Patient may have prescription filled one day early if pharmacy is closed on scheduled refill date. Do not fill until: 10/25/16 To last until: 11/24/16  . HYDROcodone-acetaminophen (NORCO/VICODIN) 5-325 MG tablet    Sig: Take 1 tablet by mouth every 4 (four) hours as needed for severe pain.    Dispense:  180 tablet    Refill:  0    Do not place this medication, or any other prescription from our practice, on "Automatic Refill". Patient may have  prescription filled one day early if pharmacy is closed on scheduled refill date. Do not fill until: 11/24/16 To last until: 12/24/16   New Prescriptions   No medications on file   Medications administered today: Ms. Lovecchio had no medications administered during this visit. Lab-work, procedure(s), and/or referral(s): No orders of the defined types were placed in this encounter.  Imaging and/or referral(s): None  Interventional therapies: Planned, scheduled, and/or pending:   None at this time.    Considering:   Diagnostic bilateral cervical facet block under fluoroscopic guidance and IV sedation.  Possible bilateral cervical facet radiofrequency ablation under fluoroscopic guidance and IV sedation.  Diagnostic right-sided cervical epidural steroid injection under fluoroscopic guidance, with or without sedation.  Diagnostic bilateral intra-articular shoulder joint injection under fluoroscopic guidance, without without IV sedation. Diagnostic bilateral suprascapular nerve block under fluoroscopic guidance, with or without sedation. Possible bilateral suprascapular nerve radiofrequency ablation under fluoroscopic guidance and IV sedation. Diagnostic bilateral lumbar facet block under fluoroscopic guidance and IV sedation.  Possible bilateral lumbar facet radiofrequency ablation under fluoroscopic guidance and IV sedation.  Diagnostic left sided carpal tunnel injection without fluoroscopy or IV sedation.    Palliative PRN treatment(s):   Diagnostic bilateral cervical facet block under fluoroscopic guidance and IV sedation.  Diagnostic right-sided cervical epidural steroid injection under fluoroscopic guidance, with or without sedation.  Diagnostic bilateral intra-articular shoulder joint injection under fluoroscopic guidance, without without IV sedation. Diagnostic bilateral suprascapular nerve block under fluoroscopic guidance, with or without sedation. Diagnostic bilateral lumbar  facet block under fluoroscopic guidance and IV sedation.  Diagnostic left sided L4-5 lumbar epidural steroid  injection under fluoroscopic guidance, without without sedation. Diagnostic left sided carpal tunnel injection without fluoroscopy or IV sedation.    Provider-requested follow-up: Return in about 4 months (around 12/13/2016) for Med-Mgmt.  Future Appointments Date Time Provider [place]  12/08/2016 1:30 PM Milinda Pointer, MD [facility] None   Primary Care Physician: Denton Lank, MD Location: [facility] Facility Note by: Mickie Kozikowski A. Dossie Arbour, M.D, DABA, DABAPM, DABPM, DABIPP, FIPP Date: 08/19/16; Time: 1:15 PM  Pain Score Disclaimer: We use the NRS-11 scale. This is a self-reported, subjective measurement of pain severity with only modest accuracy. It is used primarily to identify changes within a particular patient. It must be understood that outpatient pain scales are significantly less accurate that those used for research, where they can be applied under ideal controlled circumstances with minimal exposure to variables. In reality, the score is likely to be a combination of pain intensity and pain affect, where pain affect describes the degree of emotional arousal or changes in action readiness caused by the sensory experience of pain. Factors such as social and work situation, setting, emotional state, anxiety levels, expectation, and prior pain experience may influence pain perception and show large inter-individual differences that may also be affected by time variables.  Patient instructions provided during this appointment: Patient Instructions  Pain Management Discharge Instructions  General Discharge Instructions :  If you need to reach your doctor call: Monday-Friday 8:00 am - 4:00 pm at [phone] or toll free [phone].  After clinic hours [phone] to have operator reach doctor.  Bring all of your medication bottles to all your  appointments in the pain clinic.  To cancel or reschedule your appointment with Pain Management please remember to call 24 hours in advance to avoid a fee.  Refer to the educational materials which you have been given on: General Risks, I had my Procedure. Discharge Instructions, Post Sedation.  Post Procedure Instructions:  The drugs you were given will stay in your system until tomorrow, so for the next 24 hours you should not drive, make any legal decisions or drink any alcoholic beverages.  You may eat anything you prefer, but it is better to start with liquids then soups and crackers, and gradually work up to solid foods.  Please notify your doctor immediately if you have any unusual bleeding, trouble breathing or pain that is not related to your normal pain.  Depending on the type of procedure that was done, some parts of your body may feel week and/or numb.  This usually clears up by tonight or the next day.  Walk with the use of an assistive device or accompanied by an adult for the 24 hours.  You may use ice on the affected area for the first 24 hours.  Put ice in a Ziploc bag and cover with a towel and place against area 15 minutes on 15 minutes off.  You may switch to heat after 24 hours.

## 2016-09-13 ENCOUNTER — Encounter: Payer: Medicare Other | Admitting: Pain Medicine

## 2016-09-20 ENCOUNTER — Telehealth: Payer: Self-pay

## 2016-09-20 NOTE — Telephone Encounter (Addendum)
Pt needs medication override to be done through [facility] [phone] Medication-Carisoprodal

## 2016-12-08 ENCOUNTER — Encounter: Payer: Self-pay | Admitting: Pain Medicine

## 2016-12-08 ENCOUNTER — Ambulatory Visit: Payer: Medicare Other | Attending: Pain Medicine | Admitting: Pain Medicine

## 2016-12-08 VITALS — BP 128/68 | HR 73 | Temp 95.7°F | Resp 16 | Ht 62.0 in | Wt 150.0 lb

## 2016-12-08 DIAGNOSIS — G894 Chronic pain syndrome: Secondary | ICD-10-CM | POA: Diagnosis present

## 2016-12-08 DIAGNOSIS — M79604 Pain in right leg: Secondary | ICD-10-CM | POA: Insufficient documentation

## 2016-12-08 DIAGNOSIS — Z79891 Long term (current) use of opiate analgesic: Secondary | ICD-10-CM | POA: Diagnosis not present

## 2016-12-08 DIAGNOSIS — M542 Cervicalgia: Secondary | ICD-10-CM

## 2016-12-08 DIAGNOSIS — M545 Low back pain: Secondary | ICD-10-CM | POA: Diagnosis not present

## 2016-12-08 DIAGNOSIS — M47812 Spondylosis without myelopathy or radiculopathy, cervical region: Secondary | ICD-10-CM

## 2016-12-08 DIAGNOSIS — E538 Deficiency of other specified B group vitamins: Secondary | ICD-10-CM | POA: Insufficient documentation

## 2016-12-08 DIAGNOSIS — M797 Fibromyalgia: Secondary | ICD-10-CM

## 2016-12-08 DIAGNOSIS — M79605 Pain in left leg: Secondary | ICD-10-CM

## 2016-12-08 DIAGNOSIS — M5442 Lumbago with sciatica, left side: Secondary | ICD-10-CM | POA: Diagnosis not present

## 2016-12-08 DIAGNOSIS — K589 Irritable bowel syndrome without diarrhea: Secondary | ICD-10-CM | POA: Insufficient documentation

## 2016-12-08 DIAGNOSIS — M1288 Other specific arthropathies, not elsewhere classified, other specified site: Secondary | ICD-10-CM | POA: Diagnosis not present

## 2016-12-08 DIAGNOSIS — G8929 Other chronic pain: Secondary | ICD-10-CM

## 2016-12-08 DIAGNOSIS — J449 Chronic obstructive pulmonary disease, unspecified: Secondary | ICD-10-CM | POA: Diagnosis not present

## 2016-12-08 DIAGNOSIS — Z8249 Family history of ischemic heart disease and other diseases of the circulatory system: Secondary | ICD-10-CM | POA: Insufficient documentation

## 2016-12-08 DIAGNOSIS — E559 Vitamin D deficiency, unspecified: Secondary | ICD-10-CM | POA: Diagnosis not present

## 2016-12-08 DIAGNOSIS — I2510 Atherosclerotic heart disease of native coronary artery without angina pectoris: Secondary | ICD-10-CM | POA: Insufficient documentation

## 2016-12-08 DIAGNOSIS — I471 Supraventricular tachycardia: Secondary | ICD-10-CM | POA: Diagnosis not present

## 2016-12-08 DIAGNOSIS — E785 Hyperlipidemia, unspecified: Secondary | ICD-10-CM | POA: Insufficient documentation

## 2016-12-08 DIAGNOSIS — Z90710 Acquired absence of both cervix and uterus: Secondary | ICD-10-CM | POA: Diagnosis not present

## 2016-12-08 DIAGNOSIS — Z9889 Other specified postprocedural states: Secondary | ICD-10-CM | POA: Insufficient documentation

## 2016-12-08 DIAGNOSIS — M7918 Myalgia, other site: Secondary | ICD-10-CM

## 2016-12-08 DIAGNOSIS — G4701 Insomnia due to medical condition: Secondary | ICD-10-CM

## 2016-12-08 DIAGNOSIS — Z803 Family history of malignant neoplasm of breast: Secondary | ICD-10-CM | POA: Diagnosis not present

## 2016-12-08 DIAGNOSIS — M791 Myalgia: Secondary | ICD-10-CM

## 2016-12-08 DIAGNOSIS — F1190 Opioid use, unspecified, uncomplicated: Secondary | ICD-10-CM

## 2016-12-08 DIAGNOSIS — M5441 Lumbago with sciatica, right side: Secondary | ICD-10-CM

## 2016-12-08 DIAGNOSIS — Z888 Allergy status to other drugs, medicaments and biological substances status: Secondary | ICD-10-CM | POA: Insufficient documentation

## 2016-12-08 DIAGNOSIS — M5412 Radiculopathy, cervical region: Secondary | ICD-10-CM

## 2016-12-08 MED ORDER — VITAMIN D3 50 MCG (2000 UT) PO CAPS: 2000.0000 [IU] | ORAL_CAPSULE | Freq: Every day | ORAL | 2 refills | Status: DC

## 2016-12-08 MED ORDER — HYDROCODONE-ACETAMINOPHEN 5-325 MG PO TABS: 1.0000 | ORAL_TABLET | ORAL | 0 refills | Status: DC | PRN

## 2016-12-08 MED ORDER — PREGABALIN 100 MG PO CAPS: 100.0000 mg | ORAL_CAPSULE | Freq: Every day | ORAL | 2 refills | Status: DC

## 2016-12-08 MED ORDER — MELATONIN 10 MG PO CAPS: 20.0000 mg | ORAL_CAPSULE | Freq: Every evening | ORAL | 2 refills | Status: DC | PRN

## 2016-12-08 MED ORDER — PREGABALIN 25 MG PO CAPS: 25.0000 mg | ORAL_CAPSULE | Freq: Every day | ORAL | 2 refills | Status: DC

## 2016-12-08 MED ORDER — CYANOCOBALAMIN 2000 MCG PO TABS: 2000.0000 ug | ORAL_TABLET | Freq: Every day | ORAL | 2 refills | Status: DC

## 2016-12-08 MED ORDER — TIZANIDINE HCL 4 MG PO CAPS: 4.0000 mg | ORAL_CAPSULE | Freq: Three times a day (TID) | ORAL | 2 refills | Status: DC | PRN

## 2016-12-08 NOTE — Progress Notes (Signed)
Patient's Name: Zoe Sanchez  MRN: [id number]  Referring Provider: Denton Lank, MD  DOB: 17-Nov-1955  PCP: Denton Lank, MD  DOS: 12/08/2016  Note by: Kathlen Brunswick. Dossie Arbour, MD  Service setting: Ambulatory outpatient  Specialty: Interventional Pain Management  Location: [facility] (AMB) Pain Management Facility    Patient type: Established   Primary Reason(s) for Visit: Encounter for prescription drug management (Level of risk: moderate) CC: Back Pain (lower) and Neck Pain  HPI  Zoe Sanchez is a 61 y.o. year old, female patient, who comes today for a medication management evaluation. She has Abnormal mammogram; Microcalcifications of the breast; Encounter for therapeutic drug level monitoring; Long term current use of opiate analgesic; Opiate use (30 MME/Day); Magnesium deficiency; Vitamin B12 deficiency; Myofascial pain syndrome; Cervical facet syndrome (Location of Primary Source of Pain) (Bilateral) (R>L); Chronic neck pain (Location of Primary Source of Pain) (Bilateral) (R>L); Chronic pain syndrome; Cervical spondylosis; Anxiety; Awareness of heartbeats; Paroxysmal supraventricular tachycardia ([place]); Breath shortness; Syncope and collapse; Fibromyalgia; Cervical herniated disc (C5-6 and C6-7); Cervical foraminal stenosis (Bilateral) (C5-6); Chronic cervical radicular pain (Location of Secondary source of pain) (Bilateral) (R>L) (C8 Dermatome); Chronic low back pain (Location of Tertiary source of pain) (Bilateral) (R>L); Lumbar spondylosis; Lower extremity pain (Bilateral) (L>R); Coronary atherosclerosis of native coronary artery; Generalized anxiety disorder; Intermittent urinary incontinence; History of psychiatric disorder; Depression; Family history of chronic pain; History of attempted suicide; History of suicidal ideation; Irritable bowel syndrome; Intermittent diarrhea; Chronic constipation; Insomnia secondary to chronic pain; History of hiatal hernia; Long term prescription opiate use; Vitamin D  insufficiency; 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 Her primarily concern today is the Back Pain (lower) and Neck Pain  Pain Assessment: Self-Reported Pain Score: 2 /10             Reported level is compatible with observation.       Pain Type: Chronic pain Pain Location: Neck Pain Orientation: Right, Left (worse on right) Pain Descriptors / Indicators: Shooting, Sharp (in toes) Pain Frequency: Intermittent  Zoe Sanchez was last scheduled for an appointment on 08/19/2016 for medication management. During today's appointment we reviewed Ms. Hise's chronic pain status, as well as her outpatient medication regimen.  The patient  reports that she does not use drugs. Her body mass index is 27.44 kg/m.  Further details on both, my assessment(s), as well as the proposed treatment plan, please see below.  Controlled Substance Pharmacotherapy Assessment REMS (Risk Evaluation and Mitigation Strategy)  Analgesic:Hydrocodone/APAP 5/325 one every 4 hours (6 per day) (30 mg/day) MME/day:30 mg/day. Landis Martins, RN  12/08/2016  1:31 PM  Sign at close encounter Nursing Pain Medication Assessment:  Safety precautions to be maintained throughout the outpatient stay will include: orient to surroundings, keep bed in low position, maintain call bell within reach at all times, provide assistance with transfer out of bed and ambulation.  Medication Inspection Compliance: Pill count conducted under aseptic conditions, in front of the patient. Neither the pills nor the bottle was removed from the patient's sight at any time. Once count was completed pills were immediately returned to the patient in their original bottle.  Medication: Hydrocodone/APAP Pill/Patch Count: 106 of 180 pills  remain Bottle Appearance: Standard pharmacy container. Clearly labeled. Filled Date: 03/15 / 2018 Last Medication intake:  Today   Pharmacokinetics: Liberation and absorption (onset of action):  WNL Distribution (time to peak effect): WNL Metabolism and excretion (duration of action): WNL         Pharmacodynamics: Desired effects: Analgesia: [name] reports >50% benefit. Functional ability: Patient reports that medication allows her to accomplish basic ADLs Clinically meaningful improvement in function (CMIF): Sustained CMIF goals met Perceived effectiveness: Described as relatively effective, allowing for increase in activities of daily living (ADL) Undesirable effects: Side-effects or Adverse reactions: None reported Monitoring: [place] PMP: Online review of the past 83-monthperiod conducted. Compliant with practice rules and regulations List of all UDS test(s) done:  Lab Results  Component Value Date   TOXASSSELUR FINAL 03/09/2016   TOXASSSELUR FINAL 09/28/2015   Last UDS on record: ToxAssure Select 13  Date Value Ref Range Status  03/09/2016 FINAL  Final    Comment:    ==================================================================== TOXASSURE SELECT 13 (MW) ==================================================================== Test                             Result       Flag       Units Drug Present and Declared for Prescription Verification   Hydrocodone                    >9434        EXPECTED   ng/mg creat   Hydromorphone                  443          EXPECTED   ng/mg creat   Dihydrocodeine                 459          EXPECTED   ng/mg creat   Norhydrocodone                 >4717        EXPECTED   ng/mg creat    Sources of hydrocodone include scheduled prescription    medications. Hydromorphone, dihydrocodeine and norhydrocodone are    expected metabolites of hydrocodone. Hydromorphone and    dihydrocodeine are also available as scheduled prescription     medications. Drug Absent but Declared for Prescription Verification   Diazepam                       Not Detected UNEXPECTED ng/mg creat ==================================================================== Test                      Result    Flag   Units      Ref Range   Creatinine              106              mg/dL      >=20 ==================================================================== Declared Medications:  The flagging and interpretation on this report are based on the  following declared medications.  Unexpected results may arise from  inaccuracies in the declared medications.  **Note: The testing scope of this panel includes these medications:  Diazepam (Valium)  Hydrocodone (Norco)  **Note: The testing scope of this panel does not include following  reported medications:  Acetaminophen (Norco)  Amitriptyline (Elavil)  Carisoprodol (Soma)  Cholecalciferol  Cyanocobalamin  Estradiol (Estrace)  Gemfibrozil (Lopid)  Linaclotide (Linzess)  Magnesium (Mag-Ox)  Montelukast (Singulair)  Omeprazole  Pregabalin (Lyrica)  Propranolol (Inderal)  Salmeterol (Serevent)  Umeclidinium (Incruse Ellipta)  Zolpidem (Ambien) ====================================================================  For clinical consultation, please call [phone]. ====================================================================    UDS interpretation: Compliant          Medication Assessment Form: Reviewed. Patient indicates being compliant with therapy Treatment compliance: Compliant Risk Assessment Profile: Aberrant behavior: See prior evaluations. None observed or detected today Comorbid factors increasing risk of overdose: See prior notes. No additional risks detected today Risk of substance use disorder (SUD): Low Opioid Risk Tool (ORT) Total Score: 0  Interpretation Table:  Score <3 = Low Risk for SUD  Score between 4-7 = Moderate Risk for SUD  Score >8 = High Risk for Opioid Abuse    Risk Mitigation Strategies:  Patient Counseling: Covered Patient-Prescriber Agreement (PPA): Present and active  Notification to other healthcare providers: Done  Pharmacologic Plan: No change in therapy, at this time  Laboratory Chemistry  Inflammation Markers Lab Results  Component Value Date   ESRSEDRATE 26 09/28/2015   (CRP: Acute Phase) (ESR: Chronic Phase) Renal Function Markers Lab Results  Component Value Date   BUN 11 09/28/2015   CREATININE 0.70 09/28/2015   GFRAA >60 09/28/2015   GFRNONAA >60 09/28/2015   Hepatic Function Markers Lab Results  Component Value Date   AST 19 09/28/2015   ALT 22 09/28/2015   ALBUMIN 3.5 09/28/2015   ALKPHOS 100 09/28/2015   Electrolytes Lab Results  Component Value Date   NA 137 09/28/2015   K 4.2 09/28/2015   CL 106 09/28/2015   CALCIUM 8.9 09/28/2015   MG 1.8 03/09/2016   Neuropathy Markers Lab Results  Component Value Date   MNOTRRNH65 790 (H) 03/09/2016   Bone Pathology Markers Lab Results  Component Value Date   ALKPHOS 100 09/28/2015   25OHVITD1 49 03/09/2016   25OHVITD2 <1.0 03/09/2016   25OHVITD3 48 03/09/2016   CALCIUM 8.9 09/28/2015   Coagulation Parameters Lab Results  Component Value Date   INR 0.9 03/06/2012   LABPROT 12.8 03/06/2012   PLT 415 11/09/2012   Cardiovascular Markers Lab Results  Component Value Date   HGB 12.3 11/09/2012   HCT 36.1 11/09/2012   Note: Lab results reviewed.  Recent Diagnostic Imaging Review  No results found. Note: Imaging results reviewed.          Meds  The patient has a current medication list which includes the following prescription(s): amitriptyline, betamethasone dipropionate, vitamin d3, cyanocobalamin, desonide, dicyclomine, estradiol, fluticasone, gemfibrozil, hydrocodone-acetaminophen, hydrocodone-acetaminophen, hydrocodone-acetaminophen, incruse ellipta, melatonin, metronidazole, montelukast, omeprazole, pregabalin, pregabalin, propranolol,  propranolol er, salmeterol, and tizanidine.  Current Outpatient Prescriptions on File Prior to Visit  Medication Sig  . amitriptyline (ELAVIL) 100 MG tablet 200 mg at bedtime.   Marland Kitchen desonide (DESOWEN) 0.05 % cream   . dicyclomine (BENTYL) 20 MG tablet   . estradiol (ESTRACE) 1 MG tablet   . gemfibrozil (LOPID) 600 MG tablet Take by mouth 2 (two) times daily before a meal.   . INCRUSE ELLIPTA 62.5 MCG/INH AEPB   . metroNIDAZOLE (FLAGYL) 500 MG tablet   . montelukast (SINGULAIR) 10 MG tablet Take by mouth at bedtime.   Marland Kitchen omeprazole (PRILOSEC) 20 MG capsule Take 20 mg by mouth 2 (two) times daily before a meal.   . propranolol (INDERAL) 40 MG tablet Take 40 mg by mouth 4 (four) times daily. Takes 6 tabs five times per day.  . salmeterol (SEREVENT DISKUS) 50 MCG/DOSE diskus inhaler Inhale 1 puff into the lungs 2 (two) times daily.    No current facility-administered medications on file prior to visit.    ROS  Constitutional: Denies any fever or chills Gastrointestinal: No reported hemesis, hematochezia, vomiting, or acute GI distress Musculoskeletal: Denies any acute onset joint swelling, redness, loss of ROM, or weakness Neurological: No reported episodes of acute onset apraxia, aphasia, dysarthria, agnosia, amnesia, paralysis, loss of coordination, or loss of consciousness  Allergies  Ms. Iannone is allergic to prednisone and other.  PFSH  Drug: Ms. Brougham  reports that she does not use drugs. Alcohol:  reports that she does not drink alcohol. Tobacco:  reports that she has never smoked. She has never used smokeless tobacco. Medical:  has a past medical history of Abnormal heart rhythm; Anxiety; Asthma; CAD (coronary artery disease); Cancer ([place]); Cervical radicular pain (Location of Secondary source of pain) (Bilateral) (R>L) (C8 Dermatome) (06/29/2015); Chronic pain; COPD (chronic obstructive pulmonary disease) ([place]); Degenerative disk disease; Depression; Endometriosis; Family history  of chronic pain (06/29/2015); Fibromyalgia; Fibromyalgia; GERD (gastroesophageal reflux disease); Hiatal hernia; History of attempted suicide (06/29/2015); History of hiatal hernia (06/29/2015); History of psychiatric disorder (06/29/2015); History of suicidal ideation (06/29/2015); Hyperlipidemia; Insomnia; Insomnia; and Irritable bowel syndrome. Family: family history includes Breast cancer in her maternal aunt; Heart disease in her father; Hypertension in her father and mother.  Past Surgical History:  Procedure Laterality Date  . ABDOMINAL HYSTERECTOMY  1986  . BREAST BIOPSY Right 2016   CORE W/CLIP - EARLY INTRADUCTAL PAPILLOMA  . BREAST SURGERY    . SKIN LESION EXCISION     basal cell and squamous cell  . TONSILLECTOMY  1957   Constitutional Exam  General appearance: Well nourished, well developed, and well hydrated. In no apparent acute distress Vitals:   12/08/16 1317  BP: 128/68  Pulse: 73  Resp: 16  Temp: (!) 95.7 F (35.4 C)  TempSrc: Oral  SpO2: 99%  Weight: 150 lb (68 kg)  Height: '5\' 2"'  (1.575 m)   BMI Assessment: Estimated body mass index is 27.44 kg/m as calculated from the following:   Height as of this encounter: '5\' 2"'  (1.575 m).   Weight as of this encounter: 150 lb (68 kg).  BMI interpretation table: BMI level Category Range association with higher incidence of chronic pain  <18 kg/m2 Underweight   18.5-24.9 kg/m2 Ideal body weight   25-29.9 kg/m2 Overweight Increased incidence by 20%  30-34.9 kg/m2 Obese (Class I) Increased incidence by 68%  35-39.9 kg/m2 Severe obesity (Class II) Increased incidence by 136%  >40 kg/m2 Extreme obesity (Class III) Increased incidence by 254%   BMI Readings from Last 4 Encounters:  12/08/16 27.44 kg/m  08/19/16 26.75 kg/m  06/13/16 28.35 kg/m  03/09/16 27.46 kg/m   Wt Readings from Last 4 Encounters:  12/08/16 150 lb (68 kg)  08/19/16 151 lb (68.5 kg)  06/13/16 155 lb (70.3 kg)  03/09/16 155 lb (70.3 kg)   Psych/Mental status: Alert, oriented x 3 (person, place, & time)       Eyes: PERLA Respiratory: No evidence of acute respiratory distress  Cervical Spine Exam  Inspection: No masses, redness, or swelling Alignment: Symmetrical Functional ROM: Unrestricted ROM Stability: No instability detected Muscle strength & Tone: Functionally intact Sensory: Unimpaired Palpation: No palpable anomalies  Upper Extremity (UE) Exam    Side: Right upper extremity  Side: Left upper extremity  Inspection: No masses, redness, swelling, or asymmetry. No contractures  Inspection: No masses, redness, swelling, or asymmetry. No contractures  Functional ROM: Unrestricted ROM          Functional ROM: Unrestricted ROM  Muscle strength & Tone: Functionally intact  Muscle strength & Tone: Functionally intact  Sensory: Unimpaired  Sensory: Unimpaired  Palpation: No palpable anomalies  Palpation: No palpable anomalies  Specialized Test(s): Deferred         Specialized Test(s): Deferred          Thoracic Spine Exam  Inspection: No masses, redness, or swelling Alignment: Symmetrical Functional ROM: Unrestricted ROM Stability: No instability detected Sensory: Unimpaired Muscle strength & Tone: No palpable anomalies  Lumbar Spine Exam  Inspection: No masses, redness, or swelling Alignment: Symmetrical Functional ROM: Unrestricted ROM Stability: No instability detected Muscle strength & Tone: Functionally intact Sensory: Unimpaired Palpation: No palpable anomalies Provocative Tests: Lumbar Hyperextension and rotation test: evaluation deferred today       Patrick's Maneuver: evaluation deferred today              Gait & Posture Assessment  Ambulation: Unassisted Gait: Relatively normal for age and body habitus Posture: WNL   Lower Extremity Exam    Side: Right lower extremity  Side: Left lower extremity  Inspection: No masses, redness, swelling, or asymmetry. No contractures  Inspection: No  masses, redness, swelling, or asymmetry. No contractures  Functional ROM: Unrestricted ROM          Functional ROM: Unrestricted ROM          Muscle strength & Tone: Functionally intact  Muscle strength & Tone: Functionally intact  Sensory: Unimpaired  Sensory: Unimpaired  Palpation: No palpable anomalies  Palpation: No palpable anomalies   Assessment  Primary Diagnosis & Pertinent Problem List: The primary encounter diagnosis was Chronic neck pain (Location of Primary Source of Pain) (Bilateral) (R>L). Diagnoses of Cervical facet syndrome (Location of Primary Source of Pain) (Bilateral) (R>L), Chronic cervical radicular pain (Location of Secondary source of pain) (Bilateral) (R>L) (C8 Dermatome), Chronic low back pain (Location of Tertiary source of pain) (Bilateral) (R>L), Lower extremity pain (Bilateral) (L>R), Chronic pain syndrome, Long term prescription opiate use, Opiate use (30 MME/Day), Fibromyalgia, Vitamin B12 deficiency, Vitamin D insufficiency, Myofascial pain syndrome, and Insomnia secondary to chronic pain were also pertinent to this visit.  Status Diagnosis  Persistent Persistent Persistent 1. Chronic neck pain (Location of Primary Source of Pain) (Bilateral) (R>L)   2. Cervical facet syndrome (Location of Primary Source of Pain) (Bilateral) (R>L)   3. Chronic cervical radicular pain (Location of Secondary source of pain) (Bilateral) (R>L) (C8 Dermatome)   4. Chronic low back pain (Location of Tertiary source of pain) (Bilateral) (R>L)   5. Lower extremity pain (Bilateral) (L>R)   6. Chronic pain syndrome   7. Long term prescription opiate use   8. Opiate use (30 MME/Day)   9. Fibromyalgia   10. Vitamin B12 deficiency   11. Vitamin D insufficiency   12. Myofascial pain syndrome   13. Insomnia secondary to chronic pain      Plan of Care  Pharmacotherapy (Medications Ordered): Meds ordered this encounter  Medications  . HYDROcodone-acetaminophen (NORCO/VICODIN) 5-325 MG  tablet    Sig: Take 1 tablet by mouth every 4 (four) hours as needed for severe pain.    Dispense:  180 tablet    Refill:  0    Do not place this medication, or any other prescription from our practice, on "Automatic Refill". Patient may have prescription filled one day early if pharmacy is closed on scheduled refill date. Do not fill until: 01/23/17 To last until: 02/22/17  . HYDROcodone-acetaminophen (NORCO/VICODIN) 5-325 MG tablet    Sig: Take  1 tablet by mouth every 4 (four) hours as needed for severe pain.    Dispense:  180 tablet    Refill:  0    Do not place this medication, or any other prescription from our practice, on "Automatic Refill". Patient may have prescription filled one day early if pharmacy is closed on scheduled refill date. Do not fill until: 02/22/17 To last until: 03/24/17  . HYDROcodone-acetaminophen (NORCO/VICODIN) 5-325 MG tablet    Sig: Take 1 tablet by mouth every 4 (four) hours as needed for severe pain.    Dispense:  180 tablet    Refill:  0    Do not place this medication, or any other prescription from our practice, on "Automatic Refill". Patient may have prescription filled one day early if pharmacy is closed on scheduled refill date. Do not fill until: 12/24/16 To last until: 01/23/17  . pregabalin (LYRICA) 100 MG capsule    Sig: Take 1 capsule (100 mg total) by mouth at bedtime.    Dispense:  30 capsule    Refill:  2    Do not place this medication, or any other prescription from our practice, on "Automatic Refill". Patient may have prescription filled one day early if pharmacy is closed on scheduled refill date.  . cyanocobalamin ([employer] VITAMIN B12) 2000 MCG tablet    Sig: Take 1 tablet (2,000 mcg total) by mouth daily.    Dispense:  30 tablet    Refill:  2    Do not place this medication, or any other prescription from our practice, on "Automatic Refill".  . Cholecalciferol (VITAMIN D3) 2000 units capsule    Sig: Take 1 capsule (2,000 Units total)  by mouth daily.    Dispense:  30 capsule    Refill:  2    Do not place this medication, or any other prescription from our practice, on "Automatic Refill".  . pregabalin (LYRICA) 25 MG capsule    Sig: Take 1 capsule (25 mg total) by mouth at bedtime.    Dispense:  30 capsule    Refill:  2    Do not place this medication, or any other prescription from our practice, on "Automatic Refill". Patient may have prescription filled one day early if pharmacy is closed on scheduled refill date.  Marland Kitchen tiZANidine (ZANAFLEX) 4 MG capsule    Sig: Take 1 capsule (4 mg total) by mouth 3 (three) times daily as needed for muscle spasms.    Dispense:  90 capsule    Refill:  2    Do not place this medication, or any other prescription from our practice, on "Automatic Refill". Patient may have prescription filled one day early if pharmacy is closed on scheduled refill date.  . Melatonin 10 MG CAPS    Sig: Take 20 mg by mouth at bedtime as needed.    Dispense:  60 capsule    Refill:  2    Do not add to the electronic "Automatic Refill" notification system. Patient may have prescription filled one day early if pharmacy is closed on scheduled refill date.   New Prescriptions   MELATONIN 10 MG CAPS    Take 20 mg by mouth at bedtime as needed.   TIZANIDINE (ZANAFLEX) 4 MG CAPSULE    Take 1 capsule (4 mg total) by mouth 3 (three) times daily as needed for muscle spasms.   Medications administered today: Ms. Chahal had no medications administered during this visit. Lab-work, procedure(s), and/or referral(s): Orders Placed This Encounter  Procedures  . MR CERVICAL SPINE WO CONTRAST  . MR LUMBAR SPINE WO CONTRAST  . ToxASSURE Select 13 (MW), Urine   Imaging and/or referral(s): MR CERVICAL SPINE WO CONTRAST MR LUMBAR SPINE WO CONTRAST  Interventional therapies: Planned, scheduled, and/or pending:   Not at this time.   Considering:   Diagnostic bilateral cervical facet block  Possible bilateral cervical  facet radiofrequency ablation  Diagnostic right-sided cervical epidural steroid injection  Diagnostic bilateral intra-articular shoulder joint injection  Diagnostic bilateral suprascapular nerve block  Possible bilateral suprascapular nerve radiofrequency ablation  Diagnostic bilateral lumbar facet block  Possible bilateral lumbar facet radiofrequency ablation  Diagnostic left sided carpal tunnel injection   Palliative PRN treatment(s):   Diagnostic bilateral cervical facet block  Diagnostic right-sided cervical epidural steroid injection  Diagnostic bilateral intra-articular shoulder joint injection Diagnostic bilateral suprascapular nerve block  Diagnostic bilateral lumbar facet block  Diagnostic left sided L4-5 lumbar epidural steroid injection  Diagnostic left sided carpal tunnel injection    Provider-requested follow-up: Return in about 3 months (around 03/10/2017) for (Nurse Practitioner) Med-Mgmt.  Future Appointments Date Time Provider [place]  12/23/2016 1:00 PM [facility] [place]  12/23/2016 2:00 PM [facility]  03/09/2017 1:00 PM [place], NP [facility] None   Primary Care Physician: Denton Lank, MD Location: [facility] Note by: Kathlen Brunswick. Dossie Arbour, M.D, DABA, DABAPM, DABPM, DABIPP, FIPP Date: 12/08/2016; Time: 3:37 PM  Pain Score Disclaimer: We use the NRS-11 scale. This is a self-reported, subjective measurement of pain severity with only modest accuracy. It is used primarily to identify changes within a particular patient. It must be understood that outpatient pain scales are significantly less accurate that those used for research, where they can be applied under ideal controlled circumstances with minimal exposure to variables. In reality, the score is likely to be a combination of pain intensity and pain affect, where pain affect describes the degree of emotional arousal or changes in action readiness caused by  the sensory experience of pain. Factors such as social and work situation, setting, emotional state, anxiety levels, expectation, and prior pain experience may influence pain perception and show large inter-individual differences that may also be affected by time variables.  Patient instructions provided during this appointment: There are no Patient Instructions on file for this visit.

## 2016-12-08 NOTE — Progress Notes (Signed)
Nursing Pain Medication Assessment:  Safety precautions to be maintained throughout the outpatient stay will include: orient to surroundings, keep bed in low position, maintain call bell within reach at all times, provide assistance with transfer out of bed and ambulation.  Medication Inspection Compliance: Pill count conducted under aseptic conditions, in front of the patient. Neither the pills nor the bottle was removed from the patient's sight at any time. Once count was completed pills were immediately returned to the patient in their original bottle.  Medication: Hydrocodone/APAP Pill/Patch Count: 106 of 180 pills remain Bottle Appearance: Standard pharmacy container. Clearly labeled. Filled Date: 03/15 / 2018 Last Medication intake:  Today

## 2016-12-12 LAB — TOXASSURE SELECT 13 (MW), URINE

## 2016-12-23 ENCOUNTER — Ambulatory Visit: Payer: Medicare Other

## 2017-01-03 ENCOUNTER — Telehealth: Payer: Self-pay | Admitting: *Deleted

## 2017-01-04 NOTE — Telephone Encounter (Signed)
Spoke with patient re; tizanidine 4 mg vs Soma that she was taking.  Patient states that she is unable to function while taking the tizanidine and wants to be placed back on Soma.

## 2017-01-04 NOTE — Telephone Encounter (Signed)
Spoke with patient re; medication concerns, will need to schedule appt for patient to discuss with Dr Dossie Arbour.  Phone call transferred to front desk for scheduling.

## 2017-01-12 ENCOUNTER — Ambulatory Visit: Payer: Medicare Other | Attending: Nurse Practitioner | Admitting: Nurse Practitioner

## 2017-01-12 ENCOUNTER — Encounter: Payer: Self-pay | Admitting: Nurse Practitioner

## 2017-01-12 VITALS — BP 151/75 | HR 74 | Temp 98.1°F | Resp 16 | Ht 63.0 in | Wt 155.0 lb

## 2017-01-12 DIAGNOSIS — Z79891 Long term (current) use of opiate analgesic: Secondary | ICD-10-CM | POA: Insufficient documentation

## 2017-01-12 DIAGNOSIS — Z888 Allergy status to other drugs, medicaments and biological substances status: Secondary | ICD-10-CM | POA: Diagnosis not present

## 2017-01-12 DIAGNOSIS — Z79899 Other long term (current) drug therapy: Secondary | ICD-10-CM | POA: Insufficient documentation

## 2017-01-12 DIAGNOSIS — M7918 Myalgia, other site: Secondary | ICD-10-CM

## 2017-01-12 DIAGNOSIS — M791 Myalgia: Secondary | ICD-10-CM | POA: Insufficient documentation

## 2017-01-12 DIAGNOSIS — Z5181 Encounter for therapeutic drug level monitoring: Secondary | ICD-10-CM | POA: Diagnosis not present

## 2017-01-12 DIAGNOSIS — M25511 Pain in right shoulder: Secondary | ICD-10-CM | POA: Diagnosis not present

## 2017-01-12 DIAGNOSIS — M47812 Spondylosis without myelopathy or radiculopathy, cervical region: Secondary | ICD-10-CM | POA: Diagnosis not present

## 2017-01-12 DIAGNOSIS — G894 Chronic pain syndrome: Secondary | ICD-10-CM | POA: Diagnosis not present

## 2017-01-12 DIAGNOSIS — M5412 Radiculopathy, cervical region: Secondary | ICD-10-CM

## 2017-01-12 DIAGNOSIS — G8929 Other chronic pain: Secondary | ICD-10-CM | POA: Diagnosis not present

## 2017-01-12 DIAGNOSIS — M4722 Other spondylosis with radiculopathy, cervical region: Secondary | ICD-10-CM | POA: Insufficient documentation

## 2017-01-12 MED ORDER — CYCLOBENZAPRINE HCL 5 MG PO TABS: 5.0000 mg | ORAL_TABLET | Freq: Three times a day (TID) | ORAL | 0 refills | Status: DC | PRN

## 2017-01-12 NOTE — Progress Notes (Signed)
Safety precautions to be maintained throughout the outpatient stay will include: orient to surroundings, keep bed in low position, maintain call bell within reach at all times, provide assistance with transfer out of bed and ambulation.  

## 2017-01-12 NOTE — Progress Notes (Signed)
Patient's Name: Zoe Sanchez  MRN: [id number]  Referring Provider: Denton Lank, MD  DOB: 01/16/1956  PCP: Denton Lank, MD  DOS: 01/12/2017  Note by: Vevelyn Francois NP  Service setting: Ambulatory outpatient  Specialty: Interventional Pain Management  Location: [facility] (AMB) Pain Management Facility    Patient type: Established    Primary Reason(s) for Visit: Evaluation of chronic illnesses with exacerbation, or progression (Level of risk: moderate) CC: Neck Pain; Fibromyalgia; and Back Pain (lower)  HPI  Zoe Sanchez is a 61 y.o. year old, female patient, who comes today for a follow-up evaluation. She has Abnormal mammogram; Microcalcifications of the breast; Encounter for therapeutic drug level monitoring; Long term current use of opiate analgesic; Opiate use (30 MME/Day); Magnesium deficiency; Vitamin B12 deficiency; Myofascial pain syndrome; Cervical facet syndrome (Location of Primary Source of Pain) (Bilateral) (R>L); Chronic neck pain (Location of Primary Source of Pain) (Bilateral) (R>L); Chronic pain syndrome; Cervical spondylosis; Anxiety; Awareness of heartbeats; Paroxysmal supraventricular tachycardia ([place]); Breath shortness; Syncope and collapse; Fibromyalgia; Cervical herniated disc (C5-6 and C6-7); Cervical foraminal stenosis (Bilateral) (C5-6); Chronic cervical radicular pain (Location of Secondary source of pain) (Bilateral) (R>L) (C8 Dermatome); Chronic low back pain (Location of Tertiary source of pain) (Bilateral) (R>L); Lumbar spondylosis; Lower extremity pain (Bilateral) (L>R); Coronary atherosclerosis of native coronary artery; Generalized anxiety disorder; Intermittent urinary incontinence; History of psychiatric disorder; Depression; Family history of chronic pain; History of attempted suicide; History of suicidal ideation; Irritable bowel syndrome; Intermittent diarrhea; Chronic constipation; Insomnia secondary to chronic pain; History of hiatal hernia; Long term prescription opiate  use; Vitamin D insufficiency; 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 Zoe. [place] was last seen on Visit date not found. Her primarily concern today is the Neck Pain; Fibromyalgia; and Back Pain (lower)  Pain Assessment: Self-Reported Pain Score: 3 /10 Clinically the patient looks like a 1/10 Reported level is inconsistent with clinical observations. Information on the proper use of the pain scale provided to the patient today Pain Type: Chronic pain Pain Location: Neck (back) Pain Orientation:  (lower back) Pain Descriptors / Indicators: Sore, Radiating, Constant, Burning, Throbbing, Nagging, Discomfort (arms have numbness and tingling) Pain Frequency: Constant  Further details on both, my assessment(s), as well as the proposed treatment plan, please see below.  Zoe Sanchez is in today for evaluation of medication. She states that "she has DDD of her neck and back, fibromyalgia and burning in her feet". She had a medication change from Soma to Tizanidine. She felt like the combination of Vicodin, Soma and Lyrica worked well for her. She is concern that this is not effective and that it is causing dizziness. She has failed other muscle relaxer in that past but is not sure of the names. She admits that she has been on "Soma for 12 years and can not function without it" . 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 She is concern that if she does not get something effective that, "she will end up in a nursing home along with her mother". She admits that she has failed "Mobic and Neurontin".   She feels like the DDD of her neck is causing her headaches that goes around the back of her head. She denies any current numbness, tingling or weakness. She is having some pain in her right  shoulder occasionally.  Laboratory  Chemistry  Inflammation Markers Lab Results  Component Value Date   ESRSEDRATE 26 09/28/2015   (CRP: Acute Phase) (ESR: Chronic Phase) Renal Function Markers Lab Results  Component Value Date   BUN 11 09/28/2015   CREATININE 0.70 09/28/2015   GFRAA >60 09/28/2015   GFRNONAA >60 09/28/2015   Hepatic Function Markers Lab Results  Component Value Date   AST 19 09/28/2015   ALT 22 09/28/2015   ALBUMIN 3.5 09/28/2015   ALKPHOS 100 09/28/2015   Electrolytes Lab Results  Component Value Date   NA 137 09/28/2015   K 4.2 09/28/2015   CL 106 09/28/2015   CALCIUM 8.9 09/28/2015   MG 1.8 03/09/2016   Neuropathy Markers Lab Results  Component Value Date   MWUXLKGM01 027 (H) 03/09/2016   Bone Pathology Markers Lab Results  Component Value Date   ALKPHOS 100 09/28/2015   25OHVITD1 49 03/09/2016   25OHVITD2 <1.0 03/09/2016   25OHVITD3 48 03/09/2016   CALCIUM 8.9 09/28/2015   Coagulation Parameters Lab Results  Component Value Date   INR 0.9 03/06/2012   LABPROT 12.8 03/06/2012   PLT 415 11/09/2012   Cardiovascular Markers Lab Results  Component Value Date   HGB 12.3 11/09/2012   HCT 36.1 11/09/2012   Note: Lab results reviewed.  Recent Diagnostic Imaging Review  No results found. Cervical Imaging: Cervical MR wo contrast:  Results for orders placed in visit on 02/09/12  MR C Spine Ltd W/O Cm   Narrative  PRIOR REPORT IMPORTED FROM AN EXTERNAL SYSTEM    PRIOR REPORT IMPORTED FROM THE [facility] WORKFLOW SYSTEM   REASON FOR EXAM:    cervical radiculitis  COMMENTS:   PROCEDURE:     MMR - MMR CERVICAL SPINE WO CONT  - Feb 09 2012  2:26PM   RESULT:     MRI cervical spine   Comparison:  None   Indication: Cervical radiculitis   Technique: Multiplanar and multisequence MR imaging of the cervical spine  was performed without contrast.   Findings:   The cervical cord is normal in size and signal. The cervical spine is   normal  in lordotic alignment, without listhesis. Bone marrow signal is normal.  Cerebellar tonsils are normal in position. Vertebral body heights are  maintained.   C2-3: Mild broad-based disc bulge. Mild bilateral facet arthropathy, right  greater than left. No foraminal stenosis.   C3-4:  Moderate broad-based disc bulge. Moderate bilateral facet  arthropathy. Left uncovertebral degenerative change. Severe left foraminal  stenosis. No right foraminal stenosis.   C4-5: Moderate broad-based disc bulge. Bilateral uncovertebral  degenerative  change, left greater than right. Severe left foraminal stenosis. Mild  right  foraminal stenosis.   C5-6:  Mild broad-based disc bulge. Bilateral uncovertebral degenerative  changes resulting in severe bilateral foraminal stenosis, left greater  than  right.   C6-7:  Mild broad-based disc bulge. No foraminal or central canal  stenosis.   C7-T1: No significant disc bulge, central canal stenosis, or foraminal  narrowing.   IMPRESSION:   1. Cervical spondylosis as described above.   Dictation Site: 1        Lumbosacral Imaging: Lumbar MR wo contrast:  Results for orders placed in visit on 03/19/10  MR L Spine Ltd W/O Cm   Narrative PRIOR REPORT IMPORTED FROM AN EXTERNAL SYSTEM    PRIOR REPORT IMPORTED FROM THE [facility] WORKFLOW SYSTEM   REASON FOR EXAM:    back pain increasing thigh numbness  COMMENTS:   PROCEDURE:  MR  - MR LUMBAR SPINE WO CONTRAST  - Mar 19 2010 12:57PM   RESULT:   Multiplanar and multisequence imaging of the lumbar spine was obtained  without the administration of gadolinium.   The conus medullaris terminates at an L1 level. The cauda equina  demonstrate  no evidence of clumping nor thickening.   Evaluation of the osseous structures demonstrates no T1 or T2 signal  abnormalities.   At the T12-L1, L1-L2, L2-L3, L3-L4, L4-L5, and L5-S1 levels there is no  evidence of thecal sac stenosis. There is  evidence of neural foraminal  narrowing on the left at the L3-L4 level. There does not appear to be  evidence of exiting nerve root compression though mild compromise cannot  be  excluded. There is no evidence of significant disc desiccation.   IMPRESSION:   1. Mild neural foraminal narrowing on the left at the L3-L4 level  secondary  to lateralization of the disc bulge. There is no evidence of exiting nerve  root compression though compromise cannot be excluded.  2. No further focal or acute abnormalities.   Thank you for the opportunity to contribute to the care of your patient.       Note: Results of ordered imaging test(s) reviewed and explained to patient in Layman's terms. Copy of results provided to patient  Meds  The patient has a current medication list which includes the following prescription(s): amitriptyline, betamethasone dipropionate, vitamin d3, cyanocobalamin, desonide, dicyclomine, estradiol, fluticasone, gemfibrozil, hydrocodone-acetaminophen, hydrocodone-acetaminophen, hydrocodone-acetaminophen, incruse ellipta, montelukast, omeprazole, pregabalin, pregabalin, propranolol, propranolol er, salmeterol, tizanidine, and cyclobenzaprine.  Current Outpatient Prescriptions on File Prior to Visit  Medication Sig  . amitriptyline (ELAVIL) 100 MG tablet 200 mg at bedtime.   . betamethasone dipropionate (DIPROLENE) 0.05 % cream   . Cholecalciferol (VITAMIN D3) 2000 units capsule Take 1 capsule (2,000 Units total) by mouth daily.  . cyanocobalamin ([employer] VITAMIN B12) 2000 MCG tablet Take 1 tablet (2,000 mcg total) by mouth daily.  Marland Kitchen desonide (DESOWEN) 0.05 % cream   . dicyclomine (BENTYL) 20 MG tablet   . estradiol (ESTRACE) 1 MG tablet   . fluticasone (FLONASE) 50 MCG/ACT nasal spray   . gemfibrozil (LOPID) 600 MG tablet Take by mouth 2 (two) times daily before a meal.   . [START ON 01/23/2017] HYDROcodone-acetaminophen (NORCO/VICODIN) 5-325 MG tablet Take 1 tablet by mouth  every 4 (four) hours as needed for severe pain.  Derrill Memo ON 02/22/2017] HYDROcodone-acetaminophen (NORCO/VICODIN) 5-325 MG tablet Take 1 tablet by mouth every 4 (four) hours as needed for severe pain.  Marland Kitchen HYDROcodone-acetaminophen (NORCO/VICODIN) 5-325 MG tablet Take 1 tablet by mouth every 4 (four) hours as needed for severe pain.  . INCRUSE ELLIPTA 62.5 MCG/INH AEPB   . montelukast (SINGULAIR) 10 MG tablet Take by mouth at bedtime.   Marland Kitchen omeprazole (PRILOSEC) 20 MG capsule Take 20 mg by mouth 2 (two) times daily before a meal.   . pregabalin (LYRICA) 100 MG capsule Take 1 capsule (100 mg total) by mouth at bedtime.  . pregabalin (LYRICA) 25 MG capsule Take 1 capsule (25 mg total) by mouth at bedtime.  . propranolol (INDERAL) 40 MG tablet Take 40 mg by mouth 4 (four) times daily. Takes 6 tabs five times per day.  . propranolol ER (INDERAL LA) 80 MG 24 hr capsule TAKE 2 CAPSULES BY MOUTH ONCE DAILY  . salmeterol (SEREVENT DISKUS) 50 MCG/DOSE diskus inhaler Inhale 1 puff into the lungs 2 (two) times daily.   Marland Kitchen tiZANidine (ZANAFLEX)  4 MG capsule Take 1 capsule (4 mg total) by mouth 3 (three) times daily as needed for muscle spasms.   No current facility-administered medications on file prior to visit.    ROS  Constitutional: Denies any fever or chills Gastrointestinal: No reported hemesis, hematochezia, vomiting, or acute GI distress Musculoskeletal: Denies any acute onset joint swelling, redness, loss of ROM, or weakness Neurological: No reported episodes of acute onset apraxia, aphasia, dysarthria, agnosia, amnesia, paralysis, loss of coordination, or loss of consciousness  Allergies  Zoe. Krinsky is allergic to prednisone and other.  PFSH  Drug: Zoe. [place]  reports that she does not use drugs. Alcohol:  reports that she does not drink alcohol. Tobacco:  reports that she has never smoked. She has never used smokeless tobacco. Medical:  has a past medical history of Abnormal heart rhythm;  Anxiety; Asthma; CAD (coronary artery disease); Cancer ([place]); Cervical radicular pain (Location of Secondary source of pain) (Bilateral) (R>L) (C8 Dermatome) (06/29/2015); Chronic pain; COPD (chronic obstructive pulmonary disease) ([place]); Degenerative disk disease; Depression; Endometriosis; Family history of chronic pain (06/29/2015); Fibromyalgia; Fibromyalgia; GERD (gastroesophageal reflux disease); Hiatal hernia; History of attempted suicide (06/29/2015); History of hiatal hernia (06/29/2015); History of psychiatric disorder (06/29/2015); History of suicidal ideation (06/29/2015); Hyperlipidemia; Insomnia; Insomnia; and Irritable bowel syndrome. Family: family history includes Breast cancer in her maternal aunt; Heart disease in her father; Hypertension in her father and mother.  Past Surgical History:  Procedure Laterality Date  . ABDOMINAL HYSTERECTOMY  1986  . BREAST BIOPSY Right 2016   CORE W/CLIP - EARLY INTRADUCTAL PAPILLOMA  . BREAST SURGERY    . SKIN LESION EXCISION     basal cell and squamous cell  . TONSILLECTOMY  1957   Constitutional Exam  General appearance: Well nourished, well developed, and well hydrated. In no apparent acute distress Vitals:   01/12/17 1032  BP: (!) 151/75  Pulse: 74  Resp: 16  Temp: 98.1 F (36.7 C)  TempSrc: Oral  SpO2: 100%  Weight: 155 lb (70.3 kg)  Height: _0  (1.6 m)   BMI Assessment: Estimated body mass index is 27.46 kg/m as calculated from the following:   Height as of this encounter: _1  (1.6 m).   Weight as of this encounter: 155 lb (70.3 kg).  BMI interpretation table: BMI level Category Range association with higher incidence of chronic pain  <18 kg/m2 Underweight   18.5-24.9 kg/m2 Ideal body weight   25-29.9 kg/m2 Overweight Increased incidence by 20%  30-34.9 kg/m2 Obese (Class I) Increased incidence by 68%  35-39.9 kg/m2 Severe obesity (Class II) Increased incidence by 136%  >40 kg/m2 Extreme obesity (Class III) Increased  incidence by 254%   BMI Readings from Last 4 Encounters:  01/12/17 27.46 kg/m  12/08/16 27.44 kg/m  08/19/16 26.75 kg/m  06/13/16 28.35 kg/m   Wt Readings from Last 4 Encounters:  01/12/17 155 lb (70.3 kg)  12/08/16 150 lb (68 kg)  08/19/16 151 lb (68.5 kg)  06/13/16 155 lb (70.3 kg)  Psych/Mental status: Alert, oriented x 3 (person, place, & time)        Eyes: PERLA Respiratory: No evidence of acute respiratory distress  Neuro: CN II-XII intact  Cervical Spine Exam  Inspection: No masses, redness, or swelling Alignment: Symmetrical Functional ROM: Unrestricted ROM      Stability: No instability detected Muscle strength & Tone: Functionally intact Sensory: Unimpaired Palpation: No palpable anomalies              Upper Extremity (  UE) Exam    Side: Right upper extremity  Side: Left upper extremity  Inspection: No masses, redness, swelling, or asymmetry. No contractures  Inspection: No masses, redness, swelling, or asymmetry. No contractures  Functional ROM: Unrestricted ROM          Functional ROM: Unrestricted ROM          Muscle strength & Tone: Functionally intact  Muscle strength & Tone: Functionally intact  Sensory: Unimpaired  Sensory: Unimpaired  Palpation: No palpable anomalies              Palpation: No palpable anomalies              Specialized Test(s): Deferred         Specialized Test(s): Deferred           Gait & Posture Assessment  Ambulation: Unassisted Gait: Relatively normal for age and body habitus Posture: WNL    Assessment  Primary Diagnosis & Pertinent Problem List: The primary encounter diagnosis was Myofascial pain syndrome. Diagnoses of Chronic cervical radicular pain (Location of Secondary source of pain) (Bilateral) (R>L) (C8 Dermatome), Cervical spondylosis, and Chronic pain syndrome were also pertinent to this visit.  Status Diagnosis  Recurring Recurring Recurring 1. Myofascial pain syndrome   2. Chronic cervical radicular pain  (Location of Secondary source of pain) (Bilateral) (R>L) (C8 Dermatome)   3. Cervical spondylosis   4. Chronic pain syndrome      Plan of Care  Pharmacotherapy (Medications Ordered): Meds ordered this encounter  Medications  . cyclobenzaprine (FLEXERIL) 5 MG tablet    Sig: Take 1 tablet (5 mg total) by mouth 3 (three) times daily as needed for muscle spasms.    Dispense:  90 tablet    Refill:  0    Do not place this medication, or any other prescription from our practice, on "Automatic Refill". Patient may have prescription filled one day early if pharmacy is closed on scheduled refill date.    Order Specific Question:   Supervising Provider    Answer:   Milinda Pointer [phone]   New Prescriptions   CYCLOBENZAPRINE (FLEXERIL) 5 MG TABLET    Take 1 tablet (5 mg total) by mouth 3 (three) times daily as needed for muscle spasms.   Medications administered today: Zoe. [place] had no medications administered during this visit. Lab-work, procedure(s), and/or referral(s): No orders of the defined types were placed in this encounter.  Imaging and/or referral(s): None  Interventional therapies: Planned, scheduled, and/or pending:   Not at this time.   Considering:   Diagnostic bilateral cervical facet block  Possible bilateral cervical facet radiofrequency ablation  Diagnostic right-sided cervical epidural steroid injection  Diagnostic bilateral intra-articular shoulder joint injection  Diagnostic bilateral suprascapular nerve block  Possible bilateral suprascapular nerve radiofrequency ablation  Diagnostic bilateral lumbar facet block  Possible bilateral lumbar facet radiofrequency ablation  Diagnostic left sided carpal tunnel injection   Palliative PRN treatment(s):   Palliative bilateral cervical facet block  Palliative right-sided cervical epidural steroid injection  Palliative bilateral intra-articular shoulder joint injection Palliative bilateral suprascapular nerve  block  Palliative bilateral lumbar facet block  Palliative left sided L4-5 lumbar epidural steroid injection  Palliative left sided carpal tunnel injection    Provider-requested follow-up: Return in about 3 months (around 04/14/2017) for Medication Mgmt.  Future Appointments Date Time Provider [place]  01/17/2017 1:00 PM [facility] [place]  01/17/2017 2:00 PM [facility]  03/09/2017 11:15 AM Crystal Dorrene German, NP [facility] None  Primary Care Physician: Denton Lank, MD Location: [facility] Note by: Vevelyn Francois NP Date: 01/12/2017; Time: 12:08 PM  Pain Score Disclaimer: We use the NRS-11 scale. This is a self-reported, subjective measurement of pain severity with only modest accuracy. It is used primarily to identify changes within a particular patient. It must be understood that outpatient pain scales are significantly less accurate that those used for research, where they can be applied under ideal controlled circumstances with minimal exposure to variables. In reality, the score is likely to be a combination of pain intensity and pain affect, where pain affect describes the degree of emotional arousal or changes in action readiness caused by the sensory experience of pain. Factors such as social and work situation, setting, emotional state, anxiety levels, expectation, and prior pain experience may influence pain perception and show large inter-individual differences that may also be affected by time variables.  Patient instructions provided during this appointment: There are no Patient Instructions on file for this visit.

## 2017-01-12 NOTE — Patient Instructions (Signed)

## 2017-01-17 ENCOUNTER — Ambulatory Visit
Admission: RE | Admit: 2017-01-17 | Discharge: 2017-01-17 | Disposition: A | Discharge status: Home / Self Care | Payer: Medicare Other | Source: Ambulatory Visit | Attending: Pain Medicine | Admitting: Pain Medicine

## 2017-01-17 DIAGNOSIS — M5010 Cervical disc disorder with radiculopathy, unspecified cervical region: Secondary | ICD-10-CM | POA: Insufficient documentation

## 2017-01-17 DIAGNOSIS — M5116 Intervertebral disc disorders with radiculopathy, lumbar region: Secondary | ICD-10-CM | POA: Insufficient documentation

## 2017-01-17 DIAGNOSIS — M5442 Lumbago with sciatica, left side: Secondary | ICD-10-CM | POA: Diagnosis present

## 2017-01-17 DIAGNOSIS — M79605 Pain in left leg: Secondary | ICD-10-CM | POA: Diagnosis not present

## 2017-01-17 DIAGNOSIS — G8929 Other chronic pain: Secondary | ICD-10-CM | POA: Diagnosis present

## 2017-01-17 DIAGNOSIS — M4802 Spinal stenosis, cervical region: Secondary | ICD-10-CM | POA: Insufficient documentation

## 2017-01-17 DIAGNOSIS — M5412 Radiculopathy, cervical region: Secondary | ICD-10-CM

## 2017-01-17 DIAGNOSIS — M47812 Spondylosis without myelopathy or radiculopathy, cervical region: Secondary | ICD-10-CM

## 2017-01-17 DIAGNOSIS — M5441 Lumbago with sciatica, right side: Secondary | ICD-10-CM

## 2017-01-17 DIAGNOSIS — M542 Cervicalgia: Principal | ICD-10-CM

## 2017-01-17 DIAGNOSIS — M79604 Pain in right leg: Secondary | ICD-10-CM | POA: Diagnosis not present

## 2017-01-19 NOTE — Progress Notes (Signed)
Results were reviewed and found to be: significantly abnormal  Further testing may be useful  Review would suggest interventional pain management techniques may be of benefit

## 2017-02-07 ENCOUNTER — Telehealth: Payer: Self-pay

## 2017-02-07 ENCOUNTER — Other Ambulatory Visit: Payer: Self-pay | Admitting: Nurse Practitioner

## 2017-02-07 MED ORDER — CYCLOBENZAPRINE HCL 5 MG PO TABS: 5.0000 mg | ORAL_TABLET | Freq: Three times a day (TID) | ORAL | 0 refills | Status: DC | PRN

## 2017-02-07 NOTE — Telephone Encounter (Signed)
Patient says she needs a refill for flexeril. Please call patient to discuss medication

## 2017-02-07 NOTE — Telephone Encounter (Signed)
Flexeril 5 mg to be escribed by Dionisio David, NP for patient.  Patient has an appt on 03/02/17 with Dr Dossie Arbour for further discussion of medication regimen.

## 2017-03-02 ENCOUNTER — Telehealth: Payer: Self-pay | Admitting: *Deleted

## 2017-03-02 ENCOUNTER — Encounter: Payer: Self-pay | Admitting: Nurse Practitioner

## 2017-03-02 ENCOUNTER — Ambulatory Visit: Payer: Medicare Other | Attending: Nurse Practitioner | Admitting: Nurse Practitioner

## 2017-03-02 VITALS — BP 116/67 | HR 77 | Temp 98.0°F | Resp 14 | Ht 62.0 in | Wt 158.0 lb

## 2017-03-02 DIAGNOSIS — Z79899 Other long term (current) drug therapy: Secondary | ICD-10-CM | POA: Insufficient documentation

## 2017-03-02 DIAGNOSIS — K589 Irritable bowel syndrome without diarrhea: Secondary | ICD-10-CM | POA: Diagnosis not present

## 2017-03-02 DIAGNOSIS — M4696 Unspecified inflammatory spondylopathy, lumbar region: Secondary | ICD-10-CM

## 2017-03-02 DIAGNOSIS — M545 Low back pain: Secondary | ICD-10-CM | POA: Diagnosis not present

## 2017-03-02 DIAGNOSIS — E538 Deficiency of other specified B group vitamins: Secondary | ICD-10-CM

## 2017-03-02 DIAGNOSIS — G8929 Other chronic pain: Secondary | ICD-10-CM | POA: Diagnosis not present

## 2017-03-02 DIAGNOSIS — M5412 Radiculopathy, cervical region: Secondary | ICD-10-CM | POA: Insufficient documentation

## 2017-03-02 DIAGNOSIS — M79601 Pain in right arm: Secondary | ICD-10-CM | POA: Diagnosis not present

## 2017-03-02 DIAGNOSIS — Z79891 Long term (current) use of opiate analgesic: Secondary | ICD-10-CM | POA: Insufficient documentation

## 2017-03-02 DIAGNOSIS — M25512 Pain in left shoulder: Secondary | ICD-10-CM | POA: Diagnosis not present

## 2017-03-02 DIAGNOSIS — K219 Gastro-esophageal reflux disease without esophagitis: Secondary | ICD-10-CM | POA: Diagnosis not present

## 2017-03-02 DIAGNOSIS — J449 Chronic obstructive pulmonary disease, unspecified: Secondary | ICD-10-CM | POA: Diagnosis not present

## 2017-03-02 DIAGNOSIS — F411 Generalized anxiety disorder: Secondary | ICD-10-CM | POA: Insufficient documentation

## 2017-03-02 DIAGNOSIS — Z5181 Encounter for therapeutic drug level monitoring: Secondary | ICD-10-CM | POA: Diagnosis not present

## 2017-03-02 DIAGNOSIS — F329 Major depressive disorder, single episode, unspecified: Secondary | ICD-10-CM | POA: Insufficient documentation

## 2017-03-02 DIAGNOSIS — M47816 Spondylosis without myelopathy or radiculopathy, lumbar region: Secondary | ICD-10-CM | POA: Diagnosis not present

## 2017-03-02 DIAGNOSIS — E785 Hyperlipidemia, unspecified: Secondary | ICD-10-CM | POA: Diagnosis not present

## 2017-03-02 DIAGNOSIS — M25511 Pain in right shoulder: Secondary | ICD-10-CM | POA: Diagnosis not present

## 2017-03-02 DIAGNOSIS — G4700 Insomnia, unspecified: Secondary | ICD-10-CM | POA: Diagnosis not present

## 2017-03-02 DIAGNOSIS — M4802 Spinal stenosis, cervical region: Secondary | ICD-10-CM | POA: Diagnosis not present

## 2017-03-02 DIAGNOSIS — I2510 Atherosclerotic heart disease of native coronary artery without angina pectoris: Secondary | ICD-10-CM | POA: Insufficient documentation

## 2017-03-02 DIAGNOSIS — E559 Vitamin D deficiency, unspecified: Secondary | ICD-10-CM | POA: Diagnosis not present

## 2017-03-02 DIAGNOSIS — M5441 Lumbago with sciatica, right side: Secondary | ICD-10-CM

## 2017-03-02 DIAGNOSIS — M79602 Pain in left arm: Secondary | ICD-10-CM | POA: Insufficient documentation

## 2017-03-02 DIAGNOSIS — M797 Fibromyalgia: Secondary | ICD-10-CM | POA: Diagnosis not present

## 2017-03-02 DIAGNOSIS — G894 Chronic pain syndrome: Secondary | ICD-10-CM | POA: Insufficient documentation

## 2017-03-02 DIAGNOSIS — M5442 Lumbago with sciatica, left side: Secondary | ICD-10-CM

## 2017-03-02 MED ORDER — PREGABALIN 100 MG PO CAPS: 100.0000 mg | ORAL_CAPSULE | Freq: Every day | ORAL | 2 refills | Status: DC

## 2017-03-02 MED ORDER — CYCLOBENZAPRINE HCL 5 MG PO TABS: 5.0000 mg | ORAL_TABLET | Freq: Three times a day (TID) | ORAL | 0 refills | Status: DC | PRN

## 2017-03-02 MED ORDER — CYANOCOBALAMIN 2000 MCG PO TABS: 2000.0000 ug | ORAL_TABLET | Freq: Every day | ORAL | 2 refills | Status: DC

## 2017-03-02 MED ORDER — VITAMIN D3 50 MCG (2000 UT) PO CAPS: 2000.0000 [IU] | ORAL_CAPSULE | Freq: Every day | ORAL | 2 refills | Status: DC

## 2017-03-02 MED ORDER — CYCLOBENZAPRINE HCL 5 MG PO TABS: 5.0000 mg | ORAL_TABLET | Freq: Four times a day (QID) | ORAL | 2 refills | Status: DC

## 2017-03-02 MED ORDER — PREGABALIN 25 MG PO CAPS: 25.0000 mg | ORAL_CAPSULE | Freq: Every day | ORAL | 2 refills | Status: DC

## 2017-03-02 MED ORDER — HYDROCODONE-ACETAMINOPHEN 5-325 MG PO TABS: 1.0000 | ORAL_TABLET | ORAL | 0 refills | Status: DC | PRN

## 2017-03-02 NOTE — Progress Notes (Signed)
Nursing Pain Medication Assessment:  Safety precautions to be maintained throughout the outpatient stay will include: orient to surroundings, keep bed in low position, maintain call bell within reach at all times, provide assistance with transfer out of bed and ambulation.  Medication Inspection Compliance: Pill count conducted under aseptic conditions, in front of the patient. Neither the pills nor the bottle was removed from the patient's sight at any time. Once count was completed pills were immediately returned to the patient in their original bottle.  Medication: Hydrocodone/APAP Pill/Patch Count: 143 of 180 pills remain Pill/Patch Appearance: Markings consistent with prescribed medication Bottle Appearance: Standard pharmacy container. Clearly labeled. Filled Date: 06/14 / 2018 Last Medication intake:  Today

## 2017-03-02 NOTE — Progress Notes (Signed)
Patient's Name: Zoe Sanchez  MRN: [id number]  Referring Provider: Denton Lank, MD  DOB: 07/04/1956  PCP: Denton Lank, MD  DOS: 03/02/2017  Note by: Vevelyn Francois NP  Service setting: Ambulatory outpatient  Specialty: Interventional Pain Management  Location: [facility] (AMB) Pain Management Facility    Patient type: Established    Primary Reason(s) for Visit: Encounter for prescription drug management (Level of risk: moderate) CC: Neck Pain and Back Pain (lower)  HPI  Zoe Sanchez is a 61 y.o. year old, female patient, who comes today for a medication management evaluation. She has Abnormal mammogram; Microcalcifications of the breast; Encounter for therapeutic drug level monitoring; Long term current use of opiate analgesic; Opiate use (30 MME/Day); Magnesium deficiency; Vitamin B12 deficiency; Myofascial pain syndrome; Cervical facet syndrome (Location of Primary Source of Pain) (Bilateral) (R>L); Chronic neck pain (Location of Primary Source of Pain) (Bilateral) (R>L); Chronic pain syndrome; Cervical spondylosis; Anxiety; Awareness of heartbeats; Paroxysmal supraventricular tachycardia ([place]); Breath shortness; Syncope and collapse; Fibromyalgia; Cervical herniated disc (C5-6 and C6-7); Cervical foraminal stenosis (Bilateral) (C5-6); Chronic cervical radicular pain (Location of Secondary source of pain) (Bilateral) (R>L) (C8 Dermatome); Chronic low back pain (Location of Tertiary source of pain) (Bilateral) (R>L); Lumbar spondylosis; Lower extremity pain (Bilateral) (L>R); Coronary atherosclerosis of native coronary artery; Generalized anxiety disorder; Intermittent urinary incontinence; History of psychiatric disorder; Depression; Family history of chronic pain; History of attempted suicide; History of suicidal ideation; Irritable bowel syndrome; Intermittent diarrhea; Chronic constipation; Insomnia secondary to chronic pain; History of hiatal hernia; Long term prescription opiate use; Vitamin D  insufficiency; 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 Her primarily concern today is the Neck Pain and Back Pain (lower)  Pain Assessment: Self-Reported Pain Score: 2 /10             Reported level is compatible with observation.       Pain Type: Chronic pain Pain Location: Neck Pain Descriptors / Indicators: Aching, Throbbing Pain Frequency: Constant  Ms. Oshea was last scheduled for an appointment on 01/12/2017 for medication management. During today's appointment we reviewed Zoe Sanchez's chronic pain status, as well as her outpatient medication regimen. She states that her back pain is worse than leg pain. She admits that the pain goes down into her feet bilaterally with the right being worse. She states that she can not tolerate steroids secondary to tachycardia. She states that she is caring for her mother and she is working harder than ever. She continues to complain about not being able to use the [place]. She is concern about how she is going to continue to care for her mother. She states that the flexeril needed to be increased to four times daily.   The patient  reports that she does not use drugs. Her body mass index is 28.9 kg/m.  Further details on both, my assessment(s), as well as the proposed treatment plan, please see below.  Controlled Substance Pharmacotherapy Assessment REMS (Risk Evaluation and Mitigation Strategy)  Analgesic:Hydrocodone/APAP 5/325 one every 4 hours (6 per day) (30 mg/day) MME/day:30 mg/day.Donneta Romberg, Melbourne Abts, RN  03/02/2017 11:52 AM  Sign at close encounter Nursing Pain Medication Assessment:  Safety precautions to be maintained throughout the outpatient stay will include: orient to surroundings, keep bed in low position, maintain call bell  within reach at all times, provide assistance with  transfer out of bed and ambulation.  Medication Inspection Compliance: Pill count conducted under aseptic conditions, in front of the patient. Neither the pills nor the bottle was removed from the patient's sight at any time. Once count was completed pills were immediately returned to the patient in their original bottle.  Medication: Hydrocodone/APAP Pill/Patch Count: 143 of 180 pills remain Pill/Patch Appearance: Markings consistent with prescribed medication Bottle Appearance: Standard pharmacy container. Clearly labeled. Filled Date: 06/14 / 2018 Last Medication intake:  Today   Pharmacokinetics: Liberation and absorption (onset of action): WNL Distribution (time to peak effect): WNL Metabolism and excretion (duration of action): WNL         Pharmacodynamics: Desired effects: Analgesia: [name] reports >50% benefit. Functional ability: Patient reports that medication allows her to accomplish basic ADLs Clinically meaningful improvement in function (CMIF): Sustained CMIF goals met Perceived effectiveness: Described as relatively effective, allowing for increase in activities of daily living (ADL) Undesirable effects: Side-effects or Adverse reactions: None reported Monitoring: [place] PMP: Online review of the past 12-monthperiod conducted. Compliant with practice rules and regulations List of all UDS test(s) done:  Lab Results  Component Value Date   TOXASSSELUR FINAL 12/08/2016   TOXASSSELUR FINAL 03/09/2016   TOXASSSELUR FINAL 09/28/2015   Last UDS on record: Tox[employer] Select 13  Date Value Ref Range Status  12/08/2016 FINAL  Final    Comment:    ==================================================================== TOXASSURE SELECT 13 (MW) ==================================================================== Test                             Result       Flag       Units Drug Present and Declared for Prescription  Verification   Hydrocodone                    4265         EXPECTED   ng/mg creat   Hydromorphone                  304          EXPECTED   ng/mg creat   Dihydrocodeine                 378          EXPECTED   ng/mg creat   Norhydrocodone                 >4310        EXPECTED   ng/mg creat    Sources of hydrocodone include scheduled prescription    medications. Hydromorphone, dihydrocodeine and norhydrocodone are    expected metabolites of hydrocodone. Hydromorphone and    dihydrocodeine are also available as scheduled prescription    medications. ==================================================================== Test                      Result    Flag   Units      Ref Range   Creatinine              116              mg/dL      >=20 ==================================================================== Declared Medications:  The flagging and interpretation on this report are based on the  following declared medications.  Unexpected results may arise from  inaccuracies in the declared medications.  **Note: The testing scope of this panel includes these medications:  Hydrocodone (Hydrocodone-Acetaminophen)  **Note:  The testing scope of this panel does not include following  reported medications:  Acetaminophen (Hydrocodone-Acetaminophen)  Amitriptyline  Betamethasone  Carisoprodol  Cholecalciferol  Cyanocobalamin  Desonide  Dicyclomine  Estradiol  Fluticasone  Gemfibrozil  Melatonin  Metronidazole  Montelukast  Omeprazole  Pregabalin  Propranolol  Salmeterol  Tizanidine  Umeclidinium  Zolpidem ==================================================================== For clinical consultation, please call [phone]. ====================================================================    UDS interpretation: Compliant          Medication Assessment Form: Reviewed. Patient indicates being compliant with therapy Treatment compliance: Compliant Risk Assessment  Profile: Aberrant behavior: See prior evaluations. None observed or detected today Comorbid factors increasing risk of overdose: See prior notes. No additional risks detected today Risk of substance use disorder (SUD): Low Opioid Risk Tool (ORT) Total Score:    Interpretation Table:  Score <3 = Low Risk for SUD  Score between 4-7 = Moderate Risk for SUD  Score >8 = High Risk for Opioid Abuse   Risk Mitigation Strategies:  Patient Counseling: Covered Patient-Prescriber Agreement (PPA): Present and active  Notification to other healthcare providers: Done  Pharmacologic Plan: No change in therapy, at this time  Laboratory Chemistry  Inflammation Markers Lab Results  Component Value Date   ESRSEDRATE 26 09/28/2015   (CRP: Acute Phase) (ESR: Chronic Phase) Renal Function Markers Lab Results  Component Value Date   BUN 11 09/28/2015   CREATININE 0.70 09/28/2015   GFRAA >60 09/28/2015   GFRNONAA >60 09/28/2015   Hepatic Function Markers Lab Results  Component Value Date   AST 19 09/28/2015   ALT 22 09/28/2015   ALBUMIN 3.5 09/28/2015   ALKPHOS 100 09/28/2015   Electrolytes Lab Results  Component Value Date   NA 137 09/28/2015   K 4.2 09/28/2015   CL 106 09/28/2015   CALCIUM 8.9 09/28/2015   MG 1.8 03/09/2016   Neuropathy Markers Lab Results  Component Value Date   DHWYSHUO37 290 (H) 03/09/2016   Bone Pathology Markers Lab Results  Component Value Date   ALKPHOS 100 09/28/2015   25OHVITD1 49 03/09/2016   25OHVITD2 <1.0 03/09/2016   25OHVITD3 48 03/09/2016   CALCIUM 8.9 09/28/2015   Coagulation Parameters Lab Results  Component Value Date   INR 0.9 03/06/2012   LABPROT 12.8 03/06/2012   PLT 415 11/09/2012   Cardiovascular Markers Lab Results  Component Value Date   HGB 12.3 11/09/2012   HCT 36.1 11/09/2012   Note: Lab results reviewed.  Recent Diagnostic Imaging Review  Mr Cervical Spine Wo Contrast  Result Date: 01/17/2017 CLINICAL DATA:   Cervical radiculopathy and low back pain. Bilateral foot pain, right greater than left. EXAM: MRI CERVICAL AND LUMBAR SPINE WITHOUT CONTRAST TECHNIQUE: Multiplanar and multiecho pulse sequences of the cervical spine, to include the craniocervical junction and cervicothoracic junction, and lumbar spine, were obtained without intravenous contrast. COMPARISON:  Lumbar spine MRI 03/19/2010 FINDINGS: MRI CERVICAL SPINE FINDINGS Alignment: No static subluxation. Vertebrae: Multilevel osteophytosis. No acute compression fracture or focal marrow lesion. Type 2 degenerative endplate changes at S1-J1. No facet edema. Cord: Normal signal and caliber Posterior Fossa, vertebral arteries, paraspinal tissues: The lower cervical esophagus is patulous. Disc levels: C1-C2: Normal. C2-C3: Right-sided facet hypertrophy.  No disc bulge.  No stenosis. C3-C4: Left-greater-than-right facet hypertrophy and bilateral uncovertebral hypertrophy. No central spinal canal stenosis. Mild right and severe left neural foraminal stenosis. C4-C5: Left subarticular disc osteophyte complex with associated bilateral uncovertebral hypertrophy and right greater than left facet hypertrophy. Severe left foraminal stenosis. Attenuation of the thecal  sac with minimal mass effect on the left anterior aspect of the spinal cord. C5-C6: Small disc bulge and bilateral uncovertebral hypertrophy. No central spinal canal stenosis. Moderate bilateral foraminal narrowing. C6-C7: Medium-sized central disc extrusion with slight inferior migration with mild narrowing of the central spinal canal. No neural foraminal stenosis. C7-T1: Normal disc space and facets. No spinal canal or neuroforaminal stenosis. MRI LUMBAR SPINE FINDINGS Segmentation:  Standard Alignment:  Normal Vertebrae: No acute compression fracture, discitis-osteomyelitis, facet edema or other focal marrow lesion. No epidural collection. Conus medullaris: Extends to the L1 level and appears normal. Paraspinal  and other soft tissues: The visualized aorta, IVC and iliac vessels are normal. The visualized retroperitoneal organs and paraspinal soft tissues are normal. Disc levels: T12-L1: Normal disc space and facets. No spinal canal or neuroforaminal stenosis. L1-L2: Normal disc space and facets. No spinal canal or neuroforaminal stenosis. L2-L3: Unchanged minimal left foraminal protrusion without stenosis. L3-L4: Unchanged small left foraminal disc protrusion in close proximity to the exiting left L3 nerve root. Narrowing of the left subarticular recess with mild displacement of the descending left L4 nerve root. L4-L5: Severe right and moderate left facet hypertrophy with synovial cyst arising from the medial aspects of both facet joints, measuring 5 x 3 mm on the right and 2 x 2 mm on the left. The right-sided cyst causes medial displacement of the descending nerve roots in the right lateral recess. L5-S1: Severe right and moderate left facet hypertrophy. No spinal canal stenosis. No neural foraminal narrowing. Visualized sacrum: Normal. IMPRESSION: 1. Multilevel cervical degenerative disc disease with uncovertebral and facet hypertrophy causing moderate to severe foraminal stenosis at the left C4, left C5 and bilateral C6 neural foramina. 2. No advanced cervical spinal stenosis. 3. Moderate to severe lower lumbar facet arthrosis with a 5 x 3 mm synovial cyst arising from the medial aspect of the right L4-L5 facet joint, medially displacing the descending nerve roots in the right lateral recess. Correlate for right-sided L5 or S1 radiculopathy. Smaller synovial cyst of the left facet joint does not cause mass effect on the thecal sac. 4. Unchanged left foraminal disc protrusion at L3-L4, in close proximity to the exiting L3 nerve root and also posteriorly displacing the descending left L4 root. This could serve as a source for left L3 or L4 radiculopathy. Electronically Signed   By: Ulyses Jarred M.D.   On: 01/17/2017  15:01   Mr Lumbar Spine Wo Contrast  Result Date: 01/17/2017 CLINICAL DATA:  Cervical radiculopathy and low back pain. Bilateral foot pain, right greater than left. EXAM: MRI CERVICAL AND LUMBAR SPINE WITHOUT CONTRAST TECHNIQUE: Multiplanar and multiecho pulse sequences of the cervical spine, to include the craniocervical junction and cervicothoracic junction, and lumbar spine, were obtained without intravenous contrast. COMPARISON:  Lumbar spine MRI 03/19/2010 FINDINGS: MRI CERVICAL SPINE FINDINGS Alignment: No static subluxation. Vertebrae: Multilevel osteophytosis. No acute compression fracture or focal marrow lesion. Type 2 degenerative endplate changes at G3-O7. No facet edema. Cord: Normal signal and caliber Posterior Fossa, vertebral arteries, paraspinal tissues: The lower cervical esophagus is patulous. Disc levels: C1-C2: Normal. C2-C3: Right-sided facet hypertrophy.  No disc bulge.  No stenosis. C3-C4: Left-greater-than-right facet hypertrophy and bilateral uncovertebral hypertrophy. No central spinal canal stenosis. Mild right and severe left neural foraminal stenosis. C4-C5: Left subarticular disc osteophyte complex with associated bilateral uncovertebral hypertrophy and right greater than left facet hypertrophy. Severe left foraminal stenosis. Attenuation of the thecal sac with minimal mass effect on the left anterior aspect of  the spinal cord. C5-C6: Small disc bulge and bilateral uncovertebral hypertrophy. No central spinal canal stenosis. Moderate bilateral foraminal narrowing. C6-C7: Medium-sized central disc extrusion with slight inferior migration with mild narrowing of the central spinal canal. No neural foraminal stenosis. C7-T1: Normal disc space and facets. No spinal canal or neuroforaminal stenosis. MRI LUMBAR SPINE FINDINGS Segmentation:  Standard Alignment:  Normal Vertebrae: No acute compression fracture, discitis-osteomyelitis, facet edema or other focal marrow lesion. No epidural  collection. Conus medullaris: Extends to the L1 level and appears normal. Paraspinal and other soft tissues: The visualized aorta, IVC and iliac vessels are normal. The visualized retroperitoneal organs and paraspinal soft tissues are normal. Disc levels: T12-L1: Normal disc space and facets. No spinal canal or neuroforaminal stenosis. L1-L2: Normal disc space and facets. No spinal canal or neuroforaminal stenosis. L2-L3: Unchanged minimal left foraminal protrusion without stenosis. L3-L4: Unchanged small left foraminal disc protrusion in close proximity to the exiting left L3 nerve root. Narrowing of the left subarticular recess with mild displacement of the descending left L4 nerve root. L4-L5: Severe right and moderate left facet hypertrophy with synovial cyst arising from the medial aspects of both facet joints, measuring 5 x 3 mm on the right and 2 x 2 mm on the left. The right-sided cyst causes medial displacement of the descending nerve roots in the right lateral recess. L5-S1: Severe right and moderate left facet hypertrophy. No spinal canal stenosis. No neural foraminal narrowing. Visualized sacrum: Normal. IMPRESSION: 1. Multilevel cervical degenerative disc disease with uncovertebral and facet hypertrophy causing moderate to severe foraminal stenosis at the left C4, left C5 and bilateral C6 neural foramina. 2. No advanced cervical spinal stenosis. 3. Moderate to severe lower lumbar facet arthrosis with a 5 x 3 mm synovial cyst arising from the medial aspect of the right L4-L5 facet joint, medially displacing the descending nerve roots in the right lateral recess. Correlate for right-sided L5 or S1 radiculopathy. Smaller synovial cyst of the left facet joint does not cause mass effect on the thecal sac. 4. Unchanged left foraminal disc protrusion at L3-L4, in close proximity to the exiting L3 nerve root and also posteriorly displacing the descending left L4 root. This could serve as a source for left L3 or  L4 radiculopathy. Electronically Signed   By: Ulyses Jarred M.D.   On: 01/17/2017 15:01   Note: Imaging results reviewed.          Meds  The patient has a current medication list which includes the following prescription(s): amitriptyline, betamethasone dipropionate, vitamin d3, cyanocobalamin, cyclobenzaprine, desonide, dicyclomine, estradiol, fluticasone, gemfibrozil, hydrocodone-acetaminophen, incruse ellipta, montelukast, omeprazole, pregabalin, pregabalin, propranolol, propranolol er, salmeterol, hydrocodone-acetaminophen, and hydrocodone-acetaminophen.  Current Outpatient Prescriptions on File Prior to Visit  Medication Sig  . amitriptyline (ELAVIL) 100 MG tablet 200 mg at bedtime.   . betamethasone dipropionate (DIPROLENE) 0.05 % cream   . Cholecalciferol (VITAMIN D3) 2000 units capsule Take 1 capsule (2,000 Units total) by mouth daily.  . cyanocobalamin ([employer] VITAMIN B12) 2000 MCG tablet Take 1 tablet (2,000 mcg total) by mouth daily.  . cyclobenzaprine (FLEXERIL) 5 MG tablet Take 1 tablet (5 mg total) by mouth 3 (three) times daily as needed for muscle spasms.  Marland Kitchen desonide (DESOWEN) 0.05 % cream   . dicyclomine (BENTYL) 20 MG tablet   . estradiol (ESTRACE) 1 MG tablet   . fluticasone (FLONASE) 50 MCG/ACT nasal spray   . gemfibrozil (LOPID) 600 MG tablet Take by mouth 2 (two) times daily before a meal.   .  HYDROcodone-acetaminophen (NORCO/VICODIN) 5-325 MG tablet Take 1 tablet by mouth every 4 (four) hours as needed for severe pain.  . INCRUSE ELLIPTA 62.5 MCG/INH AEPB   . montelukast (SINGULAIR) 10 MG tablet Take by mouth at bedtime.   Marland Kitchen omeprazole (PRILOSEC) 20 MG capsule Take 20 mg by mouth 2 (two) times daily before a meal.   . pregabalin (LYRICA) 100 MG capsule Take 1 capsule (100 mg total) by mouth at bedtime.  . pregabalin (LYRICA) 25 MG capsule Take 1 capsule (25 mg total) by mouth at bedtime.  . propranolol (INDERAL) 40 MG tablet Take 40 mg by mouth 4 (four) times daily. Takes  6 tabs five times per day.  . propranolol ER (INDERAL LA) 80 MG 24 hr capsule TAKE 2 CAPSULES BY MOUTH ONCE DAILY  . salmeterol (SEREVENT DISKUS) 50 MCG/DOSE diskus inhaler Inhale 1 puff into the lungs 2 (two) times daily.   Marland Kitchen HYDROcodone-acetaminophen (NORCO/VICODIN) 5-325 MG tablet Take 1 tablet by mouth every 4 (four) hours as needed for severe pain.  Marland Kitchen HYDROcodone-acetaminophen (NORCO/VICODIN) 5-325 MG tablet Take 1 tablet by mouth every 4 (four) hours as needed for severe pain.   No current facility-administered medications on file prior to visit.    ROS  Constitutional: Denies any fever or chills Gastrointestinal: No reported hemesis, hematochezia, vomiting, or acute GI distress Musculoskeletal: Denies any acute onset joint swelling, redness, loss of ROM, or weakness Neurological: No reported episodes of acute onset apraxia, aphasia, dysarthria, agnosia, amnesia, paralysis, loss of coordination, or loss of consciousness  Allergies  Ms. Hardeman is allergic to prednisone and other.  PFSH  Drug: Ms. Mcgonagle  reports that she does not use drugs. Alcohol:  reports that she does not drink alcohol. Tobacco:  reports that she has never smoked. She has never used smokeless tobacco. Medical:  has a past medical history of Abnormal heart rhythm; Anxiety; Asthma; CAD (coronary artery disease); Cancer ([place]); Cervical radicular pain (Location of Secondary source of pain) (Bilateral) (R>L) (C8 Dermatome) (06/29/2015); Chronic pain; COPD (chronic obstructive pulmonary disease) ([place]); Degenerative disk disease; Depression; Endometriosis; Family history of chronic pain (06/29/2015); Fibromyalgia; Fibromyalgia; GERD (gastroesophageal reflux disease); Hiatal hernia; History of attempted suicide (06/29/2015); History of hiatal hernia (06/29/2015); History of psychiatric disorder (06/29/2015); History of suicidal ideation (06/29/2015); Hyperlipidemia; Insomnia; Insomnia; and Irritable bowel syndrome. Family:  family history includes Breast cancer in her maternal aunt; Heart disease in her father; Hypertension in her father and mother.  Past Surgical History:  Procedure Laterality Date  . ABDOMINAL HYSTERECTOMY  1986  . BREAST BIOPSY Right 2016   CORE W/CLIP - EARLY INTRADUCTAL PAPILLOMA  . BREAST SURGERY    . SKIN LESION EXCISION     basal cell and squamous cell  . TONSILLECTOMY  1957   Constitutional Exam  General appearance: Well nourished, well developed, and well hydrated. In no apparent acute distress Vitals:   03/02/17 1141  BP: 116/67  Pulse: 77  Resp: 14  Temp: 98 F (36.7 C)  TempSrc: Oral  SpO2: 99%  Weight: 158 lb (71.7 kg)  Height: '5\' 2"'  (1.575 m)   BMI Assessment: Estimated body mass index is 28.9 kg/m as calculated from the following:   Height as of this encounter: '5\' 2"'  (1.575 m).   Weight as of this encounter: 158 lb (71.7 kg).  BMI interpretation table: BMI level Category Range association with higher incidence of chronic pain  <18 kg/m2 Underweight   18.5-24.9 kg/m2 Ideal body weight   25-29.9 kg/m2 Overweight Increased  incidence by 20%  30-34.9 kg/m2 Obese (Class I) Increased incidence by 68%  35-39.9 kg/m2 Severe obesity (Class II) Increased incidence by 136%  >40 kg/m2 Extreme obesity (Class III) Increased incidence by 254%   BMI Readings from Last 4 Encounters:  03/02/17 28.90 kg/m  01/12/17 27.46 kg/m  12/08/16 27.44 kg/m  08/19/16 26.75 kg/m   Wt Readings from Last 4 Encounters:  03/02/17 158 lb (71.7 kg)  01/12/17 155 lb (70.3 kg)  12/08/16 150 lb (68 kg)  08/19/16 151 lb (68.5 kg)  Psych/Mental status: Alert, oriented x 3 (person, place, & time)       Eyes: PERLA Respiratory: No evidence of acute respiratory distress  Cervical Spine Exam  Inspection: No masses, redness, or swelling Alignment: Symmetrical Functional ROM: Unrestricted ROM      Stability: No instability detected Muscle strength & Tone: Functionally intact Sensory:  Unimpaired Palpation: No palpable anomalies              Upper Extremity (UE) Exam    Side: Right upper extremity  Side: Left upper extremity  Inspection: No masses, redness, swelling, or asymmetry. No contractures  Inspection: No masses, redness, swelling, or asymmetry. No contractures  Functional ROM: Unrestricted ROM          Functional ROM: Unrestricted ROM          Muscle strength & Tone: Functionally intact  Muscle strength & Tone: Functionally intact  Sensory: Unimpaired  Sensory: Unimpaired  Palpation: No palpable anomalies              Palpation: No palpable anomalies              Specialized Test(s): Deferred         Specialized Test(s): Deferred          Thoracic Spine Exam  Inspection: No masses, redness, or swelling Alignment: Symmetrical Functional ROM: Unrestricted ROM Stability: No instability detected Sensory: Unimpaired Muscle strength & Tone: No palpable anomalies  Lumbar Spine Exam  Inspection: No masses, redness, or swelling Alignment: Symmetrical Functional ROM: Unrestricted ROM      Stability: No instability detected Muscle strength & Tone: Functionally intact Sensory: Unimpaired Palpation: No palpable anomalies       Provocative Tests: Lumbar Hyperextension and rotation test: evaluation deferred today       Patrick's Maneuver: evaluation deferred today                    Gait & Posture Assessment  Ambulation: Unassisted Gait: Relatively normal for age and body habitus Posture: WNL   Lower Extremity Exam    Side: Right lower extremity  Side: Left lower extremity  Inspection: No masses, redness, swelling, or asymmetry. No contractures  Inspection: No masses, redness, swelling, or asymmetry. No contractures  Functional ROM: Unrestricted ROM          Functional ROM: Unrestricted ROM          Muscle strength & Tone: Functionally intact  Muscle strength & Tone: Functionally intact  Sensory: Unimpaired  Sensory: Unimpaired  Palpation: No palpable anomalies   Palpation: No palpable anomalies   Assessment  Primary Diagnosis & Pertinent Problem List: There were no encounter diagnoses.  Status Diagnosis  Controlled Controlled Controlled No diagnosis found.  Problems updated and reviewed during this visit: No problems updated. Plan of Care  Pharmacotherapy (Medications Ordered): No orders of the defined types were placed in this encounter.  New Prescriptions   No medications on file   Medications administered  today: Ms. Arseneault had no medications administered during this visit. Lab-work, procedure(s), and/or referral(s): No orders of the defined types were placed in this encounter.  Imaging and/or referral(s): None  Interventional therapies: Planned, scheduled, and/or pending:   Right sided lumbar facet block with sedation without steroid    Considering:   Diagnostic bilateral cervical facetblock  Possible bilateral cervical facet radiofrequencyablation  Diagnostic right-sided cervical epiduralsteroid injection  Diagnostic bilateral intra-articular shoulder jointinjection  Diagnostic bilateral suprascapular nerveblock  Possible bilateral suprascapular nerve radiofrequencyablation  Diagnostic bilateral lumbar facetblock  Possible bilateral lumbar facet radiofrequencyablation  Diagnostic left sided carpal tunnel injection   Palliative PRN treatment(s):   Palliative bilateral cervical facetblock  Palliative right-sided cervical epiduralsteroid injection  Palliative bilateral intra-articular shoulderjoint injection Palliative bilateral suprascapular nerveblock  Palliative bilateral lumbar facet block  Palliativeleft sided L4-5 lumbar epidural steroid injection  Palliative left sided carpal tunnelinjection    Provider-requested follow-up: No Follow-up on file.  No future appointments. Primary Care Physician: Denton Lank, MD Location: [facility] Note by: Vevelyn Francois NP Date:  03/02/2017; Time: 12:40 PM  Pain Score Disclaimer: We use the NRS-11 scale. This is a self-reported, subjective measurement of pain severity with only modest accuracy. It is used primarily to identify changes within a particular patient. It must be understood that outpatient pain scales are significantly less accurate that those used for research, where they can be applied under ideal controlled circumstances with minimal exposure to variables. In reality, the score is likely to be a combination of pain intensity and pain affect, where pain affect describes the degree of emotional arousal or changes in action readiness caused by the sensory experience of pain. Factors such as social and work situation, setting, emotional state, anxiety levels, expectation, and prior pain experience may influence pain perception and show large inter-individual differences that may also be affected by time variables.  Patient instructions provided during this appointment: Patient Instructions   ____________________________________________________________________________________________  Appointment Policy Summary  It is our goal and responsibility to provide the medical community with assistance in the evaluation and management of patients with chronic pain. Unfortunately our resources are limited. Because we do not have an unlimited amount of time, or available appointments, we are required to closely monitor and manage their use. The following rules exist to maximize their use:  Patient's responsibilities: 1. Punctuality: You are required to be physically present and registered in our facility at least 30 minutes before your appointment. 2. Tardiness: The cutoff is your appointment time. If you have an appointment scheduled for 10:00 AM and you arrive at 10:01, you will be required to reschedule your appointment.  3. Plan ahead: Always assume that you will encounter traffic on your way in. Plan for it. If you are  dependent on a driver, make sure they understand these rules and the need to arrive early. 4. Other appointments and responsibilities: Avoid scheduling any other appointments before or after your pain clinic appointments.  5. Be prepared: Write down everything that you need to discuss with your healthcare provider and give this information to the admitting nurse. Write down the medications that you will need refilled. Bring your pills and bottles (even the empty ones), to all of your appointments, except for those where a procedure is scheduled. 6. No children or pets: Find someone to take care of them. It is not appropriate to bring them in. 7. Scheduling changes: We request "advanced notification" of any changes or cancellations. 8. Advanced notification: Defined  as a time period of more than 24 hours prior to the originally scheduled appointment. This allows for the appointment to be offered to other patients. 9. Rescheduling: When a visit is rescheduled, it will require the cancellation of the original appointment. For this reason they both fall within the category of "Cancellations".  10. Cancellations: They require advanced notification. Any cancellation less than 24 hours before the  appointment will be recorded as a "No Show". 11. No Show: Defined as an unkept appointment where the patient failed to notify or declare to the practice their intention or inability to keep the appointment.  Corrective process for repeat offenders:  1. Tardiness: Three (3) episodes of rescheduling due to late arrivals will be recorded as one (1) "No Show". 2. Cancellation or reschedule: Three (3) cancellations or rescheduling will be recorded as one (1) "No Show". 3. "No Shows": Three (3) "No Shows" within a 12 month period will result in discharge from the practice.  ____________________________________________________________________________________________

## 2017-03-02 NOTE — Patient Instructions (Addendum)
____________________________________________________________________________________________  Appointment Policy Summary  It is our goal and responsibility to provide the medical community with assistance in the evaluation and management of patients with chronic pain. Unfortunately our resources are limited. Because we do not have an unlimited amount of time, or available appointments, we are required to closely monitor and manage their use. The following rules exist to maximize their use:  Patient's responsibilities: 1. Punctuality: You are required to be physically present and registered in our facility at least 30 minutes before your appointment. 2. Tardiness: The cutoff is your appointment time. If you have an appointment scheduled for 10:00 AM and you arrive at 10:01, you will be required to reschedule your appointment.  3. Plan ahead: Always assume that you will encounter traffic on your way in. Plan for it. If you are dependent on a driver, make sure they understand these rules and the need to arrive early. 4. Other appointments and responsibilities: Avoid scheduling any other appointments before or after your pain clinic appointments.  5. Be prepared: Write down everything that you need to discuss with your healthcare provider and give this information to the admitting nurse. Write down the medications that you will need refilled. Bring your pills and bottles (even the empty ones), to all of your appointments, except for those where a procedure is scheduled. 6. No children or pets: Find someone to take care of them. It is not appropriate to bring them in. 7. Scheduling changes: We request "advanced notification" of any changes or cancellations. 8. Advanced notification: Defined as a time period of more than 24 hours prior to the originally scheduled appointment. This allows for the appointment to be offered to other patients. 9. Rescheduling: When a visit is rescheduled, it will require the  cancellation of the original appointment. For this reason they both fall within the category of "Cancellations".  10. Cancellations: They require advanced notification. Any cancellation less than 24 hours before the  appointment will be recorded as a "No Show". 11. No Show: Defined as an unkept appointment where the patient failed to notify or declare to the practice their intention or inability to keep the appointment.  Corrective process for repeat offenders:  1. Tardiness: Three (3) episodes of rescheduling due to late arrivals will be recorded as one (1) "No Show". 2. Cancellation or reschedule: Three (3) cancellations or rescheduling will be recorded as one (1) "No Show". 3. "No Shows": Three (3) "No Shows" within a 12 month period will result in discharge from the practice.  ____________________________________________________________________________________________  Pain Management Discharge Instructions  General Discharge Instructions :  If you need to reach your doctor call: Monday-Friday 8:00 am - 4:00 pm at [phone] or toll free [phone].  After clinic hours [phone] to have operator reach doctor.  Bring all of your medication bottles to all your appointments in the pain clinic.  To cancel or reschedule your appointment with Pain Management please remember to call 24 hours in advance to avoid a fee.  Refer to the educational materials which you have been given on: General Risks, I had my Procedure. Discharge Instructions, Post Sedation.  Post Procedure Instructions:  The drugs you were given will stay in your system until tomorrow, so for the next 24 hours you should not drive, make any legal decisions or drink any alcoholic beverages.  You may eat anything you prefer, but it is better to start with liquids then soups and crackers, and gradually work up to solid foods.  Please notify your doctor  immediately if you have any unusual bleeding, trouble breathing or  pain that is not related to your normal pain.  Depending on the type of procedure that was done, some parts of your body may feel week and/or numb.  This usually clears up by tonight or the next day.  Walk with the use of an assistive device or accompanied by an adult for the 24 hours.  You may use ice on the affected area for the first 24 hours.  Put ice in a Ziploc bag and cover with a towel and place against area 15 minutes on 15 minutes off.  You may switch to heat after 24 hours.GENERAL RISKS AND COMPLICATIONS  What are the risk, side effects and possible complications? Generally speaking, most procedures are safe.  However, with any procedure there are risks, side effects, and the possibility of complications.  The risks and complications are dependent upon the sites that are lesioned, or the type of nerve block to be performed.  The closer the procedure is to the spine, the more serious the risks are.  Great care is taken when placing the radio frequency needles, block needles or lesioning probes, but sometimes complications can occur. 1. Infection: Any time there is an injection through the skin, there is a risk of infection.  This is why sterile conditions are used for these blocks.  There are four possible types of infection. 1. Localized skin infection. 2. Central Nervous System Infection-This can be in the form of Meningitis, which can be deadly. 3. Epidural Infections-This can be in the form of an epidural abscess, which can cause pressure inside of the spine, causing compression of the spinal cord with subsequent paralysis. This would require an emergency surgery to decompress, and there are no guarantees that the patient would recover from the paralysis. 4. Discitis-This is an infection of the intervertebral discs.  It occurs in about 1% of discography procedures.  It is difficult to treat and it may lead to surgery.        2. Pain: the needles have to go through skin and soft tissues,  will cause soreness.       3. Damage to internal structures:  The nerves to be lesioned may be near blood vessels or    other nerves which can be potentially damaged.       4. Bleeding: Bleeding is more common if the patient is taking blood thinners such as  aspirin, Coumadin, Ticiid, Plavix, etc., or if he/she have some genetic predisposition  such as hemophilia. Bleeding into the spinal canal can cause compression of the spinal  cord with subsequent paralysis.  This would require an emergency surgery to  decompress and there are no guarantees that the patient would recover from the  paralysis.       5. Pneumothorax:  Puncturing of a lung is a possibility, every time a needle is introduced in  the area of the chest or upper back.  Pneumothorax refers to free air around the  collapsed lung(s), inside of the thoracic cavity (chest cavity).  Another two possible  complications related to a similar event would include: Hemothorax and Chylothorax.   These are variations of the Pneumothorax, where instead of air around the collapsed  lung(s), you may have blood or chyle, respectively.       6. Spinal headaches: They may occur with any procedures in the area of the spine.       7. Persistent CSF (Cerebro-Spinal Fluid) leakage: This  is a rare problem, but may occur  with prolonged intrathecal or epidural catheters either due to the formation of a fistulous  track or a dural tear.       8. Nerve damage: By working so close to the spinal cord, there is always a possibility of  nerve damage, which could be as serious as a permanent spinal cord injury with  paralysis.       9. Death:  Although rare, severe deadly allergic reactions known as "Anaphylactic  reaction" can occur to any of the medications used.      10. Worsening of the symptoms:  We can always make thing worse.  What are the chances of something like this happening? Chances of any of this occuring are extremely low.  By statistics, you have more of a  chance of getting killed in a motor vehicle accident: while driving to the hospital than any of the above occurring .  Nevertheless, you should be aware that they are possibilities.  In general, it is similar to taking a shower.  Everybody knows that you can slip, hit your head and get killed.  Does that mean that you should not shower again?  Nevertheless always keep in mind that statistics do not mean anything if you happen to be on the wrong side of them.  Even if a procedure has a 1 (one) in a 1,000,000 (million) chance of going wrong, it you happen to be that one..Also, keep in mind that by statistics, you have more of a chance of having something go wrong when taking medications.  Who should not have this procedure? If you are on a blood thinning medication (e.g. Coumadin, Plavix, see list of "Blood Thinners"), or if you have an active infection going on, you should not have the procedure.  If you are taking any blood thinners, please inform your physician.  How should I prepare for this procedure?  Do not eat or drink anything at least six hours prior to the procedure.  Bring a driver with you .  It cannot be a taxi.  Come accompanied by an adult that can drive you back, and that is strong enough to help you if your legs get weak or numb from the local anesthetic.  Take all of your medicines the morning of the procedure with just enough water to swallow them.  If you have diabetes, make sure that you are scheduled to have your procedure done first thing in the morning, whenever possible.  If you have diabetes, take only half of your insulin dose and notify our nurse that you have done so as soon as you arrive at the clinic.  If you are diabetic, but only take blood sugar pills (oral hypoglycemic), then do not take them on the morning of your procedure.  You may take them after you have had the procedure.  Do not take aspirin or any aspirin-containing medications, at least eleven (11) days  prior to the procedure.  They may prolong bleeding.  Wear loose fitting clothing that may be easy to take off and that you would not mind if it got stained with Betadine or blood.  Do not wear any jewelry or perfume  Remove any nail coloring.  It will interfere with some of our monitoring equipment.  NOTE: Remember that this is not meant to be interpreted as a complete list of all possible complications.  Unforeseen problems may occur.  BLOOD THINNERS The following drugs contain aspirin or other  products, which can cause increased bleeding during surgery and should not be taken for 2 weeks prior to and 1 week after surgery.  If you should need take something for relief of minor pain, you may take acetaminophen which is found in Tylenol,m Datril, Anacin-3 and Panadol. It is not blood thinner. The products listed below are.  Do not take any of the products listed below in addition to any listed on your instruction sheet.  A.P.C or A.P.C with Codeine Codeine Phosphate Capsules #3 Ibuprofen Ridaura  ABC compound Congesprin Imuran rimadil  Advil Cope Indocin Robaxisal  Alka-Seltzer Effervescent Pain Reliever and Antacid Coricidin or Coricidin-D  Indomethacin Rufen  Alka-Seltzer plus Cold Medicine Cosprin Ketoprofen S-A-C Tablets  Anacin Analgesic Tablets or Capsules Coumadin Korlgesic Salflex  Anacin Extra Strength Analgesic tablets or capsules CP-2 Tablets Lanoril Salicylate  Anaprox Cuprimine Capsules Levenox Salocol  Anexsia-D Dalteparin Magan Salsalate  Anodynos Darvon compound Magnesium Salicylate Sine-off  Ansaid Dasin Capsules Magsal Sodium Salicylate  Anturane Depen Capsules Marnal Soma  APF Arthritis pain formula Dewitt's Pills Measurin Stanback  Argesic Dia-Gesic Meclofenamic Sulfinpyrazone  Arthritis Bayer Timed Release Aspirin Diclofenac Meclomen Sulindac  Arthritis pain formula Anacin Dicumarol Medipren Supac  Analgesic (Safety coated) Arthralgen Diffunasal Mefanamic Suprofen   Arthritis Strength Bufferin Dihydrocodeine Mepro Compound Suprol  Arthropan liquid Dopirydamole Methcarbomol with Aspirin Synalgos  ASA tablets/Enseals Disalcid Micrainin Tagament  Ascriptin Doan's Midol Talwin  Ascriptin A/D Dolene Mobidin Tanderil  Ascriptin Extra Strength Dolobid Moblgesic Ticlid  Ascriptin with Codeine Doloprin or Doloprin with Codeine Momentum Tolectin  Asperbuf Duoprin Mono-gesic Trendar  Aspergum Duradyne Motrin or Motrin IB Triminicin  Aspirin plain, buffered or enteric coated Durasal Myochrisine Trigesic  Aspirin Suppositories Easprin Nalfon Trillsate  Aspirin with Codeine Ecotrin Regular or Extra Strength Naprosyn Uracel  Atromid-S Efficin Naproxen Ursinus  Auranofin Capsules Elmiron Neocylate Vanquish  Axotal Emagrin Norgesic Verin  Azathioprine Empirin or Empirin with Codeine Normiflo Vitamin E  Azolid Emprazil Nuprin Voltaren  Bayer Aspirin plain, buffered or children's or timed BC Tablets or powders Encaprin Orgaran Warfarin Sodium  Buff-a-Comp Enoxaparin Orudis Zorpin  Buff-a-Comp with Codeine Equegesic Os-Cal-Gesic   Buffaprin Excedrin plain, buffered or Extra Strength Oxalid   Bufferin Arthritis Strength Feldene Oxphenbutazone   Bufferin plain or Extra Strength Feldene Capsules Oxycodone with Aspirin   Bufferin with Codeine Fenoprofen Fenoprofen Pabalate or Pabalate-SF   Buffets II Flogesic Panagesic   Buffinol plain or Extra Strength Florinal or Florinal with Codeine Panwarfarin   Buf-Tabs Flurbiprofen Penicillamine   Butalbital Compound Four-way cold tablets Penicillin   Butazolidin Fragmin Pepto-Bismol   Carbenicillin Geminisyn Percodan   Carna Arthritis Reliever Geopen Persantine   Carprofen Gold's salt Persistin   Chloramphenicol Goody's Phenylbutazone   Chloromycetin Haltrain Piroxlcam   Clmetidine heparin Plaquenil   Cllnoril Hyco-pap Ponstel   Clofibrate Hydroxy chloroquine Propoxyphen         Before stopping any of these medications,  be sure to consult the physician who ordered them.  Some, such as Coumadin (Warfarin) are ordered to prevent or treat serious conditions such as "deep thrombosis", "pumonary embolisms", and other heart problems.  The amount of time that you may need off of the medication may also vary with the medication and the reason for which you were taking it.  If you are taking any of these medications, please make sure you notify your pain physician before you undergo any procedures.          Facet Joint Block The facet joints connect the bones  of the spine (vertebrae). They make it possible for you to bend, twist, and make other movements with your spine. They also keep you from bending too far, twisting too far, and making other excessive movements. A facet joint block is a procedure where a numbing medicine (anesthetic) is injected into a facet joint. Often, a type of anti-inflammatory medicine called a steroid is also injected. A facet joint block may be done to diagnose neck or back pain. If the pain gets better after a facet joint block, it means the pain is probably coming from the facet joint. If the pain does not get better, it means the pain is probably not coming from the facet joint. A facet joint block may also be done to relieve neck or back pain caused by an inflamed facet joint. A facet joint block is only done to relieve pain if the pain does not improve with other methods, such as medicine, exercise programs, and physical therapy. Tell a health care provider about:  Any allergies you have.  All medicines you are taking, including vitamins, herbs, eye drops, creams, and over-the-counter medicines.  Any problems you or family members have had with anesthetic medicines.  Any blood disorders you have.  Any surgeries you have had.  Any medical conditions you have.  Whether you are pregnant or may be pregnant. What are the risks? Generally, this is a safe procedure. However, problems  may occur, including:  Bleeding.  Injury to a nerve near the injection site.  Pain at the injection site.  Weakness or numbness in areas controlled by nerves near the injection site.  Infection.  Temporary fluid retention.  Allergic reactions to medicines or dyes.  Injury to other structures or organs near the injection site.  What happens before the procedure?  Follow instructions from your health care provider about eating or drinking restrictions.  Ask your health care provider about: ? Changing or stopping your regular medicines. This is especially important if you are taking diabetes medicines or blood thinners. ? Taking medicines such as aspirin and ibuprofen. These medicines can thin your blood. Do not take these medicines before your procedure if your health care provider instructs you not to.  Do not take any new dietary supplements or medicines without asking your health care provider first.  Plan to have someone take you home after the procedure. What happens during the procedure?  You may need to remove your clothing and dress in an open-back gown.  The procedure will be done while you are lying on an X-ray table. You will most likely be asked to lie on your stomach, but you may be asked to lie in a different position if an injection will be made in your neck.  Machines will be used to monitor your oxygen levels, heart rate, and blood pressure.  If an injection will be made in your neck, an IV tube will be inserted into one of your veins. Fluids and medicine will flow directly into your body through the IV tube.  The area over the facet joint where the injection will be made will be cleaned with soap. The surrounding skin will be covered with clean drapes.  A numbing medicine (local anesthetic) will be applied to your skin. Your skin may sting or burn for a moment.  A video X-ray machine (fluoroscopy) will be used to locate the joint. In some cases, a CT scan  may be used.  A contrast dye may be injected into the facet  joint area to help locate the joint.  When the joint is located, an anesthetic will be injected into the joint through the needle.  Your health care provider will ask you whether you feel pain relief. If you do feel relief, a steroid may be injected to provide pain relief for a longer period of time. If you do not feel relief or feel only partial relief, additional injections of an anesthetic may be made in other facet joints.  The needle will be removed.  Your skin will be cleaned.  A bandage (dressing) will be applied over each injection site. The procedure may vary among health care providers and hospitals. What happens after the procedure?  You will be observed for 15-30 minutes before being allowed to go home. This information is not intended to replace advice given to you by your health care provider. Make sure you discuss any questions you have with your health care provider. Document Released: 01/18/2007 Document Revised: 09/30/2015 Document Reviewed: 05/25/2015 Elsevier Interactive Patient Education  [employer].

## 2017-03-02 NOTE — Telephone Encounter (Signed)
Left message with mother that script for Flexeril was sent to [facility] per C Edison Pace

## 2017-03-09 ENCOUNTER — Ambulatory Visit: Payer: Medicare Other | Admitting: Nurse Practitioner

## 2017-03-22 ENCOUNTER — Encounter: Payer: Self-pay | Admitting: Pain Medicine

## 2017-03-22 ENCOUNTER — Ambulatory Visit
Admission: RE | Admit: 2017-03-22 | Discharge: 2017-03-22 | Disposition: A | Discharge status: Home / Self Care | Payer: Medicare Other | Source: Ambulatory Visit | Attending: Pain Medicine | Admitting: Pain Medicine

## 2017-03-22 ENCOUNTER — Ambulatory Visit (HOSPITAL_BASED_OUTPATIENT_CLINIC_OR_DEPARTMENT_OTHER): Payer: Medicare Other | Admitting: Pain Medicine

## 2017-03-22 VITALS — BP 137/63 | HR 71 | Temp 97.4°F | Resp 16 | Ht 62.5 in | Wt 157.0 lb

## 2017-03-22 DIAGNOSIS — M488X6 Other specified spondylopathies, lumbar region: Secondary | ICD-10-CM | POA: Diagnosis not present

## 2017-03-22 DIAGNOSIS — G8929 Other chronic pain: Secondary | ICD-10-CM | POA: Diagnosis not present

## 2017-03-22 DIAGNOSIS — M47816 Spondylosis without myelopathy or radiculopathy, lumbar region: Secondary | ICD-10-CM | POA: Diagnosis not present

## 2017-03-22 DIAGNOSIS — M5441 Lumbago with sciatica, right side: Secondary | ICD-10-CM

## 2017-03-22 DIAGNOSIS — Z79899 Other long term (current) drug therapy: Secondary | ICD-10-CM | POA: Insufficient documentation

## 2017-03-22 DIAGNOSIS — M545 Low back pain: Secondary | ICD-10-CM | POA: Diagnosis not present

## 2017-03-22 DIAGNOSIS — Z7951 Long term (current) use of inhaled steroids: Secondary | ICD-10-CM | POA: Diagnosis not present

## 2017-03-22 DIAGNOSIS — M5459 Other low back pain: Secondary | ICD-10-CM | POA: Insufficient documentation

## 2017-03-22 DIAGNOSIS — Z888 Allergy status to other drugs, medicaments and biological substances status: Secondary | ICD-10-CM | POA: Diagnosis not present

## 2017-03-22 DIAGNOSIS — M4696 Unspecified inflammatory spondylopathy, lumbar region: Secondary | ICD-10-CM

## 2017-03-22 DIAGNOSIS — M5442 Lumbago with sciatica, left side: Secondary | ICD-10-CM

## 2017-03-22 MED ORDER — ROPIVACAINE HCL 2 MG/ML IJ SOLN
9.0000 mL | Freq: Once | INTRAMUSCULAR | Status: AC
  Administered 2017-03-22: 10 mL via PERINEURAL

## 2017-03-22 MED ORDER — MIDAZOLAM HCL 5 MG/5ML IJ SOLN
1.0000 mg | INTRAMUSCULAR | Status: DC | PRN
  Administered 2017-03-22: 2 mg via INTRAVENOUS

## 2017-03-22 MED ORDER — MIDAZOLAM HCL 5 MG/5ML IJ SOLN
INTRAMUSCULAR | Status: AC
  Filled 2017-03-22: qty 5

## 2017-03-22 MED ORDER — LIDOCAINE HCL (PF) 1.5 % IJ SOLN
20.0000 mL | Freq: Once | INTRAMUSCULAR | Status: AC
  Administered 2017-03-22: 20 mL
  Filled 2017-03-22: qty 20

## 2017-03-22 MED ORDER — ROPIVACAINE HCL 2 MG/ML IJ SOLN
INTRAMUSCULAR | Status: AC
  Filled 2017-03-22: qty 20

## 2017-03-22 MED ORDER — FENTANYL CITRATE (PF) 100 MCG/2ML IJ SOLN
INTRAMUSCULAR | Status: AC
  Filled 2017-03-22: qty 2

## 2017-03-22 MED ORDER — LACTATED RINGERS IV SOLN
1000.0000 mL | Freq: Once | INTRAVENOUS | Status: AC
  Administered 2017-03-22: 1000 mL via INTRAVENOUS

## 2017-03-22 MED ORDER — FENTANYL CITRATE (PF) 100 MCG/2ML IJ SOLN
25.0000 ug | INTRAMUSCULAR | Status: DC | PRN
  Administered 2017-03-22: 50 ug via INTRAVENOUS

## 2017-03-22 NOTE — Progress Notes (Signed)
\  [id number]\Safety precautions to be maintained throughout the outpatient stay will include: orient to surroundings, keep bed in low position, maintain call bell within reach at all times, provide assistance with transfer out of bed and ambulation.  

## 2017-03-22 NOTE — Progress Notes (Deleted)
Patient's Name: Zoe Sanchez  MRN: [id number]  Referring Provider: Denton Lank, MD  DOB: 1956/04/24  PCP: Denton Lank, MD  DOS: 03/22/2017  Note by: Gillis Santa, MD  Service setting: Ambulatory outpatient  Specialty: Interventional Pain Management  Location: [facility] (AMB) Pain Management Facility    Patient type: Established   Primary Reason(s) for Visit: Encounter for prescription drug management. (Level of risk: moderate)  CC: No chief complaint on file.  HPI  Zoe Sanchez is a 61 y.o. year old, female patient, who comes today for a follow-up evaluation. She has Abnormal mammogram; Microcalcifications of the breast; Encounter for therapeutic drug level monitoring; Long term current use of opiate analgesic; Opiate use (30 MME/Day); Magnesium deficiency; Vitamin B12 deficiency; Myofascial pain syndrome; Cervical facet syndrome (Location of Primary Source of Pain) (Bilateral) (R>L); Chronic neck pain (Location of Primary Source of Pain) (Bilateral) (R>L); Chronic pain syndrome; Cervical spondylosis; Anxiety; Awareness of heartbeats; Paroxysmal supraventricular tachycardia ([place]); Breath shortness; Syncope and collapse; Fibromyalgia; Cervical herniated disc (C5-6 and C6-7); Cervical foraminal stenosis (Bilateral) (C5-6); Chronic cervical radicular pain (Location of Secondary source of pain) (Bilateral) (R>L) (C8 Dermatome); Chronic low back pain (Location of Tertiary source of pain) (Bilateral) (R>L); Lumbar spondylosis; Lower extremity pain (Bilateral) (L>R); Coronary atherosclerosis of native coronary artery; Generalized anxiety disorder; Intermittent urinary incontinence; History of psychiatric disorder; Depression; Family history of chronic pain; History of attempted suicide; History of suicidal ideation; Irritable bowel syndrome; Intermittent diarrhea; Chronic constipation; Insomnia secondary to chronic pain; History of hiatal hernia; Long term prescription opiate use; Vitamin D insufficiency;  Opioid-induced constipation ([facility]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 Zoe Sanchez was last seen on 12/08/2016. Her primarily concern today is the No chief complaint on file.  Zoe Sanchez last encounter's weight was  , with a BMI of  . We have pointed out to the patient that as long as her BMI remains above 30, this will adversely affect her pain.  Estimated body mass index is 28.9 kg/m as calculated from the following:   Height as of 03/02/17: '5\' 2"'  (1.575 m).   Weight as of 03/02/17: 158 lb (71.7 kg).  Her ideal body weight should be: Patient weight not recorded  Pain Assessment: Location:     Radiating:   Onset:   Duration:   Quality:   Severity:  /10 (self-reported pain score)  Note: {Blank single:19197::"Reported level is inconsistent with clinical observations.","Clear symptom exaggeration. Reported level of pain is not compatible with clinical observations.","Reported level is compatible with observation."} {Blank single:19197::"Clinically the patient looks like a","     "} {Blank single:19197::"0/10","1/10","2/10","3/10","4/10","5/10","     "} {Blank single:19197::"Information on the proper use of the pain scale provided to the patient today","Exaggerated score may be due to the reporting of a "suffering" component","Score may indicate symptom exaggeration","     "} Effect on ADL:   Timing:   Modifying factors:    Further details on both, my assessment(s), as well as the proposed treatment plan, please see below.  Controlled Substance Pharmacotherapy Assessment REMS (Risk Evaluation and Mitigation Strategy)  Analgesic: *** MME/day: *** mg/day.  No notes on file Pharmacokinetics: Liberation and absorption (onset of action): {Blank single:19197::"Shorter than  expected.","Longer than expected","WNL"} Distribution (time to peak effect): {Blank single:19197::"Shorter than expected.","Longer than expected","WNL"} Metabolism and excretion (duration of action): {Blank single:19197::"Shorter than expected.","Longer than expected.","WNL"} {Blank  single:19197::"This would suggest rapid metabolism and/or elimination","This would suggest impaired metabolism and/or elimination","WNL","       "} Pharmacodynamics: Desired effects: Analgesia: Ms. Gilchrest reports {Blank single:19197::"no","<50%","50%",">50%"} benefit. Functional ability: Patient reports {Blank single:19197::"being unable to accomplish basic ADLs","that medication does help, but not nearly as much as she would like","that medication allows her to have a normal productive life, including accomplishing all ADLs","that medication allows her to accomplish basic ADLs"} Clinically meaningful improvement in function (CMIF): {Blank single:19197::"Medication does not meet basic CMIF","Sustained CMIF goals met"} Perceived effectiveness: {Blank single:19197::"None","Described as ineffective and would like to make some changes","Described as relatively effective but with some room for improvement","Described as relatively effective, allowing for increase in activities of daily living (ADL)"} Undesirable effects: Side-effects or Adverse reactions: {Blank single:19197::"Minimal","Moderate","Significant","Constipation","Oversedation. This could suggest a drug interaction or accumulation","None reported"} Monitoring: [place] PMP: {Blank single:19197::"Unable to conduct review of the controlled substance reporting system due to technological failure.","Online review of the past 28-monthperiod conducted.","Online review of the past 149-montheriod conducted."} {Blank siKVQQVZ:56387::"FIEpplicable","Non-compliant","Extensive, fairly regular use of opioid analgesics identified","Unreported sources of opioid analgesics  detected","Abnormal. Results discussed with patient","Discrepancies found between information provided by the patient and the database","Compliant with practice rules and regulations"} List of all UDS test(s) done:  Lab Results  Component Value Date   TOXASSSELUR FINAL 12/08/2016   TOXASSSELUR FINAL 03/09/2016   TOXASSSELUR FINAL 09/28/2015   Last UDS on record: ToxAssure Select 13  Date Value Ref Range Status  12/08/2016 FINAL  Final    Comment:    ==================================================================== TOXASSURE SELECT 13 (MW) ==================================================================== Test                             Result       Flag       Units Drug Present and Declared for Prescription Verification   Hydrocodone                    4265         EXPECTED   ng/mg creat   Hydromorphone                  304          EXPECTED   ng/mg creat   Dihydrocodeine                 378          EXPECTED   ng/mg creat   Norhydrocodone                 >4310        EXPECTED   ng/mg creat    Sources of hydrocodone include scheduled prescription    medications. Hydromorphone, dihydrocodeine and norhydrocodone are    expected metabolites of hydrocodone. Hydromorphone and    dihydrocodeine are also available as scheduled prescription    medications. ==================================================================== Test                      Result    Flag   Units      Ref Range   Creatinine              116              mg/dL      >=20 ==================================================================== Declared Medications:  The flagging and interpretation on this report are based on the  following declared medications.  Unexpected results may arise from  inaccuracies in  the declared medications.  **Note: The testing scope of this panel includes these medications:  Hydrocodone (Hydrocodone-Acetaminophen)  **Note: The testing scope of this panel does not include  following  reported medications:  Acetaminophen (Hydrocodone-Acetaminophen)  Amitriptyline  Betamethasone  Carisoprodol  Cholecalciferol  Cyanocobalamin  Desonide  Dicyclomine  Estradiol  Fluticasone  Gemfibrozil  Melatonin  Metronidazole  Montelukast  Omeprazole  Pregabalin  Propranolol  Salmeterol  Tizanidine  Umeclidinium  Zolpidem ==================================================================== For clinical consultation, please call [phone]. ====================================================================    UDS interpretation: {Blank EMVVKP:22449::"PNP-YYFRTMYTR","ZNB applicable","No unexpected findings","Unexpected findings not considered significantly abnormal","Unexpected findings:","Compliant"} {Blank VAPOLI:10301::"THYHOOILNZ illicit substance detected","The patient was given a final warning about the accuracy of reporting medications.","In this case, absence of medication may be secondary to sample timing with relation to PRN intake.","Presence of the parent compound in the absence of its metabolites could be due to very rare metabolic variances, vs deliberate urine sample tampering.","Absence of the parent compound in the presence of its metabolites could be due to lapse of time since the last dose or unusual pharmacokinetics (Rapid Metabolism).","Patient reminded of the CDC guidelines recommending to stay away from the sedatives & benzodiazepines due to the risk of respiratory depression and death.","Patient informed of the CDC guidelines and recommendations to stay away from the concomitant use of benzodiazepines and opioids due to the increased risk of respiratory depression and death.","This may be an issue with the scheduling as opposed to taking more medication than prescribed. We will make an effort to schedule the return appointment before the patient runs out of medication.","        "} Medication Assessment Form: {Blank VJKQAS:60156::"FBP applicable.  Initial evaluation. The patient has not received any medications from our practice","Discrepancies found between patient's report and information collected","Reviewed. Abnormalities discussed","Reviewed. Patient indicates being compliant with therapy"} Treatment compliance: {Blank PHKFEX:61470::"LKH applicable. Initial evaluation","Non-compliant","Deficiencies noted and steps taken to remind the patient of the seriousness of adequate therapy compliance","Non-compliant. Steps taken to remind the patient of the seriousness of adequate therapy compliance","Recurrent non-compliance, despite repeated warnings","Compliant"} Risk Assessment Profile: Aberrant behavior: {Aberrant Behavior:210120800::"See prior evaluations. None observed or detected today"} Comorbid factors increasing risk of overdose: {Risk Factors:210120801::"See prior notes. No additional risks detected today"} Risk of substance use disorder (SUD): {Blank single:19197::"Very High","High-to-Very High","High","Moderate-to-High","Moderate","Low-to-Moderate","Low"} Opioid Risk Tool (ORT) Total Score:    Interpretation Table:  Score <3 = Low Risk for SUD  Score between 4-7 = Moderate Risk for SUD  Score >8 = High Risk for Opioid Abuse   Risk Mitigation Strategies:  Patient Counseling: {Blank single:19197::"Completed today. Counseling provided to patient as per "Patient Counseling Document". Document signed by patient, attesting to counseling and understanding","Covered"} Patient-Prescriber Agreement (PPA): {Blank single:19197::"Medication agreement broken by patient","Obtained today","Present and active"}  Notification to other healthcare providers: {Blank single:19197::"N/A. Opioid therapy discontinued","Written and sent today","Done"}  Pharmacologic Plan: {Blank single:19197::"Pending ordered tests and/or consults","Today we will take over the chronic pain medication management and from this point on our medication agreement with this  patient is active","Therapy adjustment:",""Drug Holiday"","Opioid therapy discontinued","Treatment plan will be modified to exclude opioids","For safety reasons, the treatment plan will be modified to exclude opioids","Ms. Cristo is not a candidate for opioid therapy at this time","No change in therapy, at this time"}  Laboratory Chemistry  Inflammation Markers (CRP: Acute Phase) (ESR: Chronic Phase) Lab Results  Component Value Date   ESRSEDRATE 26 09/28/2015  {Blank single:19197::"Interpretation of abnormal results:","            "}   Renal Function Markers Lab Results  Component Value  Date   BUN 11 09/28/2015   CREATININE 0.70 09/28/2015   GFRAA >60 09/28/2015   GFRNONAA >60 09/28/2015  {Blank single:19197::"Interpretation of abnormal results:","            "}   Hepatic Function Markers Lab Results  Component Value Date   AST 19 09/28/2015   ALT 22 09/28/2015   ALBUMIN 3.5 09/28/2015   ALKPHOS 100 09/28/2015  {Blank single:19197::"Interpretation of abnormal results:","            "}   Electrolytes Lab Results  Component Value Date   NA 137 09/28/2015   K 4.2 09/28/2015   CL 106 09/28/2015   CALCIUM 8.9 09/28/2015   MG 1.8 03/09/2016  {Blank single:19197::"Interpretation of abnormal results:","            "}   Neuropathy Markers Lab Results  Component Value Date   VITAMINB12 942 (H) 03/09/2016  {Blank single:19197::"Interpretation of abnormal results:","            "}   Bone Pathology Markers Lab Results  Component Value Date   ALKPHOS 100 09/28/2015   25OHVITD1 49 03/09/2016   25OHVITD2 <1.0 03/09/2016   25OHVITD3 48 03/09/2016   CALCIUM 8.9 09/28/2015  {Blank single:19197::"Interpretation of abnormal results:","            "}   Coagulation Parameters Lab Results  Component Value Date   INR 0.9 03/06/2012   LABPROT 12.8 03/06/2012   PLT 415 11/09/2012  {Blank single:19197::"Interpretation of abnormal results:","            "}   Cardiovascular  Markers Lab Results  Component Value Date   HGB 12.3 11/09/2012   HCT 36.1 11/09/2012  {Blank single:19197::"Interpretation of abnormal results:","            "}   Note: {Blank single:19197::"No results found under the [employer] electronic medical record","Results made available to patient.","Lab results reviewed and made available to patient.","Lab results reviewed and explained to patient in Layman's terms.","Lab results reviewed."}  Recent Diagnostic Imaging Review  Mr Cervical Spine Wo Contrast  Result Date: 01/17/2017 CLINICAL DATA:  Cervical radiculopathy and low back pain. Bilateral foot pain, right greater than left. EXAM: MRI CERVICAL AND LUMBAR SPINE WITHOUT CONTRAST TECHNIQUE: Multiplanar and multiecho pulse sequences of the cervical spine, to include the craniocervical junction and cervicothoracic junction, and lumbar spine, were obtained without intravenous contrast. COMPARISON:  Lumbar spine MRI 03/19/2010 FINDINGS: MRI CERVICAL SPINE FINDINGS Alignment: No static subluxation. Vertebrae: Multilevel osteophytosis. No acute compression fracture or focal marrow lesion. Type 2 degenerative endplate changes at G5-O0. No facet edema. Cord: Normal signal and caliber Posterior Fossa, vertebral arteries, paraspinal tissues: The lower cervical esophagus is patulous. Disc levels: C1-C2: Normal. C2-C3: Right-sided facet hypertrophy.  No disc bulge.  No stenosis. C3-C4: Left-greater-than-right facet hypertrophy and bilateral uncovertebral hypertrophy. No central spinal canal stenosis. Mild right and severe left neural foraminal stenosis. C4-C5: Left subarticular disc osteophyte complex with associated bilateral uncovertebral hypertrophy and right greater than left facet hypertrophy. Severe left foraminal stenosis. Attenuation of the thecal sac with minimal mass effect on the left anterior aspect of the spinal cord. C5-C6: Small disc bulge and bilateral uncovertebral hypertrophy. No central spinal  canal stenosis. Moderate bilateral foraminal narrowing. C6-C7: Medium-sized central disc extrusion with slight inferior migration with mild narrowing of the central spinal canal. No neural foraminal stenosis. C7-T1: Normal disc space and facets. No spinal canal or neuroforaminal stenosis. MRI LUMBAR SPINE FINDINGS Segmentation:  Standard Alignment:  Normal Vertebrae: No acute compression fracture, discitis-osteomyelitis, facet edema or other focal marrow lesion. No epidural collection. Conus medullaris: Extends to the L1 level and appears normal. Paraspinal and other soft tissues: The visualized aorta, IVC and iliac vessels are normal. The visualized retroperitoneal organs and paraspinal soft tissues are normal. Disc levels: T12-L1: Normal disc space and facets. No spinal canal or neuroforaminal stenosis. L1-L2: Normal disc space and facets. No spinal canal or neuroforaminal stenosis. L2-L3: Unchanged minimal left foraminal protrusion without stenosis. L3-L4: Unchanged small left foraminal disc protrusion in close proximity to the exiting left L3 nerve root. Narrowing of the left subarticular recess with mild displacement of the descending left L4 nerve root. L4-L5: Severe right and moderate left facet hypertrophy with synovial cyst arising from the medial aspects of both facet joints, measuring 5 x 3 mm on the right and 2 x 2 mm on the left. The right-sided cyst causes medial displacement of the descending nerve roots in the right lateral recess. L5-S1: Severe right and moderate left facet hypertrophy. No spinal canal stenosis. No neural foraminal narrowing. Visualized sacrum: Normal. IMPRESSION: 1. Multilevel cervical degenerative disc disease with uncovertebral and facet hypertrophy causing moderate to severe foraminal stenosis at the left C4, left C5 and bilateral C6 neural foramina. 2. No advanced cervical spinal stenosis. 3. Moderate to severe lower lumbar facet arthrosis with a 5 x 3 mm synovial cyst arising  from the medial aspect of the right L4-L5 facet joint, medially displacing the descending nerve roots in the right lateral recess. Correlate for right-sided L5 or S1 radiculopathy. Smaller synovial cyst of the left facet joint does not cause mass effect on the thecal sac. 4. Unchanged left foraminal disc protrusion at L3-L4, in close proximity to the exiting L3 nerve root and also posteriorly displacing the descending left L4 root. This could serve as a source for left L3 or L4 radiculopathy. Electronically Signed   By: Ulyses Jarred M.D.   On: 01/17/2017 15:01   Mr Lumbar Spine Wo Contrast  Result Date: 01/17/2017 CLINICAL DATA:  Cervical radiculopathy and low back pain. Bilateral foot pain, right greater than left. EXAM: MRI CERVICAL AND LUMBAR SPINE WITHOUT CONTRAST TECHNIQUE: Multiplanar and multiecho pulse sequences of the cervical spine, to include the craniocervical junction and cervicothoracic junction, and lumbar spine, were obtained without intravenous contrast. COMPARISON:  Lumbar spine MRI 03/19/2010 FINDINGS: MRI CERVICAL SPINE FINDINGS Alignment: No static subluxation. Vertebrae: Multilevel osteophytosis. No acute compression fracture or focal marrow lesion. Type 2 degenerative endplate changes at C0-K3. No facet edema. Cord: Normal signal and caliber Posterior Fossa, vertebral arteries, paraspinal tissues: The lower cervical esophagus is patulous. Disc levels: C1-C2: Normal. C2-C3: Right-sided facet hypertrophy.  No disc bulge.  No stenosis. C3-C4: Left-greater-than-right facet hypertrophy and bilateral uncovertebral hypertrophy. No central spinal canal stenosis. Mild right and severe left neural foraminal stenosis. C4-C5: Left subarticular disc osteophyte complex with associated bilateral uncovertebral hypertrophy and right greater than left facet hypertrophy. Severe left foraminal stenosis. Attenuation of the thecal sac with minimal mass effect on the left anterior aspect of the spinal cord.  C5-C6: Small disc bulge and bilateral uncovertebral hypertrophy. No central spinal canal stenosis. Moderate bilateral foraminal narrowing. C6-C7: Medium-sized central disc extrusion with slight inferior migration with mild narrowing of the central spinal canal. No neural foraminal stenosis. C7-T1: Normal disc space and facets. No spinal canal or neuroforaminal stenosis. MRI LUMBAR SPINE FINDINGS Segmentation:  Standard Alignment:  Normal Vertebrae: No acute compression fracture, discitis-osteomyelitis, facet edema or other  focal marrow lesion. No epidural collection. Conus medullaris: Extends to the L1 level and appears normal. Paraspinal and other soft tissues: The visualized aorta, IVC and iliac vessels are normal. The visualized retroperitoneal organs and paraspinal soft tissues are normal. Disc levels: T12-L1: Normal disc space and facets. No spinal canal or neuroforaminal stenosis. L1-L2: Normal disc space and facets. No spinal canal or neuroforaminal stenosis. L2-L3: Unchanged minimal left foraminal protrusion without stenosis. L3-L4: Unchanged small left foraminal disc protrusion in close proximity to the exiting left L3 nerve root. Narrowing of the left subarticular recess with mild displacement of the descending left L4 nerve root. L4-L5: Severe right and moderate left facet hypertrophy with synovial cyst arising from the medial aspects of both facet joints, measuring 5 x 3 mm on the right and 2 x 2 mm on the left. The right-sided cyst causes medial displacement of the descending nerve roots in the right lateral recess. L5-S1: Severe right and moderate left facet hypertrophy. No spinal canal stenosis. No neural foraminal narrowing. Visualized sacrum: Normal. IMPRESSION: 1. Multilevel cervical degenerative disc disease with uncovertebral and facet hypertrophy causing moderate to severe foraminal stenosis at the left C4, left C5 and bilateral C6 neural foramina. 2. No advanced cervical spinal stenosis. 3.  Moderate to severe lower lumbar facet arthrosis with a 5 x 3 mm synovial cyst arising from the medial aspect of the right L4-L5 facet joint, medially displacing the descending nerve roots in the right lateral recess. Correlate for right-sided L5 or S1 radiculopathy. Smaller synovial cyst of the left facet joint does not cause mass effect on the thecal sac. 4. Unchanged left foraminal disc protrusion at L3-L4, in close proximity to the exiting L3 nerve root and also posteriorly displacing the descending left L4 root. This could serve as a source for left L3 or L4 radiculopathy. Electronically Signed   By: Ulyses Jarred M.D.   On: 01/17/2017 15:01   Note: {Blank single:19197::"No new results found.","No results found under the [employer] medical record.","Imaging results reviewed and explained to patient in Layman's terms.","Results of ordered imaging test(s) reviewed and explained to patient in Layman's terms.","Imaging results reviewed."} {Blank single:19197::"Results made available to patient","Copy of results provided to patient","       "}  Meds   No outpatient prescriptions have been marked as taking for the 03/22/17 encounter (Appointment) with Milinda Pointer, MD.    ROS  Constitutional: {Blank single:19197::"Denies any fever or chills"} Gastrointestinal: {Blank single:19197::"No reported hemesis, hematochezia, vomiting, or acute GI distress"} Musculoskeletal: {Blank single:19197::"Denies any acute onset joint swelling, redness, loss of ROM, or weakness"} Neurological: {Blank single:19197::"No reported episodes of acute onset apraxia, aphasia, dysarthria, agnosia, amnesia, paralysis, loss of coordination, or loss of consciousness"}  Allergies  Ms. Culhane is allergic to prednisone and other.  PFSH  Drug: Ms. Badami  reports that she does not use drugs. Alcohol:  reports that she does not drink alcohol. Tobacco:  reports that she has never smoked. She has never used  smokeless tobacco. Medical:  has a past medical history of Abnormal heart rhythm; Anxiety; Asthma; CAD (coronary artery disease); Cancer ([place]); Cervical radicular pain (Location of Secondary source of pain) (Bilateral) (R>L) (C8 Dermatome) (06/29/2015); Chronic pain; COPD (chronic obstructive pulmonary disease) ([place]); Degenerative disk disease; Depression; Endometriosis; Family history of chronic pain (06/29/2015); Fibromyalgia; Fibromyalgia; GERD (gastroesophageal reflux disease); Hiatal hernia; History of attempted suicide (06/29/2015); History of hiatal hernia (06/29/2015); History of psychiatric disorder (06/29/2015); History of suicidal ideation (06/29/2015); Hyperlipidemia; Insomnia; Insomnia; and Irritable bowel  syndrome. Surgical: Ms. Diekmann  has a past surgical history that includes Abdominal hysterectomy (1986); Tonsillectomy (1957); Skin lesion excision; Breast surgery; and Breast biopsy (Right, 2016). Family: family history includes Breast cancer in her maternal aunt; Heart disease in her father; Hypertension in her father and mother.  Constitutional Exam  General appearance: {general exam:210120802::"Well nourished, well developed, and well hydrated. In no apparent acute distress"} There were no vitals filed for this visit. BMI Assessment: Estimated body mass index is 28.9 kg/m as calculated from the following:   Height as of 03/02/17: '5\' 2"'  (1.575 m).   Weight as of 03/02/17: 158 lb (71.7 kg).  BMI interpretation table: BMI level Category Range association with higher incidence of chronic pain  <18 kg/m2 Underweight   18.5-24.9 kg/m2 Ideal body weight   25-29.9 kg/m2 Overweight Increased incidence by 20%  30-34.9 kg/m2 Obese (Class I) Increased incidence by 68%  35-39.9 kg/m2 Severe obesity (Class II) Increased incidence by 136%  >40 kg/m2 Extreme obesity (Class III) Increased incidence by 254%   BMI Readings from Last 4 Encounters:  03/02/17 28.90 kg/m  01/12/17 27.46 kg/m   12/08/16 27.44 kg/m  08/19/16 26.75 kg/m   Wt Readings from Last 4 Encounters:  03/02/17 158 lb (71.7 kg)  01/12/17 155 lb (70.3 kg)  12/08/16 150 lb (68 kg)  08/19/16 151 lb (68.5 kg)  Psych/Mental status: {Blank single:19197::"Alert and oriented x 3. Exaggerated physical and/or psychosocial pain behavior perceived.","Alert, oriented x 3 (person, place, & time)"} {Blank single:19197::"Ms. 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","Midriatic","Anisocoric","Evidence of ptosis","Pin-point pupils","PERLA"} Respiratory: {Blank single:19197::"Oxygen-dependent COPD","No evidence of acute respiratory distress"}  Cervical Spine Exam  Inspection: {Blank single:19197::"Well healed scar from previous spine surgery detected","Paravertebral muscle atrophy","No masses, redness, or swelling"} Alignment: {Blank single:19197::"Asymmetric","Symmetrical"} Functional ROM: {Blank single:19197::"Improved after treatment","Adequate ROM","Decreased ROM","Diminished ROM","Full ROM","Fused","Grossly intact ROM","Guarding","Limited ROM","Mechanically restricted ROM","Minimal ROM","Pain restricted ROM","Restricted ROM","Zero ROM","ROM appears unrestricted","ROM is within functional limits (WFL)","ROM is within normal limits (WNL)","Unrestricted ROM"}{Blank single:19197::", to the right",", to the left",", bilaterally","     "} Stability: {Blank single:19197::"Possibly unstable","No instability detected"} Muscle strength & Tone: {Blank single:19197::"Inconsistent level of performance when tested","Guarding observed","Functionally intact"} Sensory: {Blank single:19197::"Improved","Movement-associated pain","Movement-associated discomfort","Impaired sensorium","Articular pain pattern","Arthropathic arthralgia","Dermatomal pain pattern","Musculoskeletal pain pattern","Myotome pain pattern","Neurogenic pain  pattern","Neuropathic pain pattern","Referred pain pattern","Visceral pain pattern","Allodynia (Painful response to non-painful stimuli)","Anesthesia (Absence of sensation)","Anesthesia Dolorosa (Numbness over painful area)","Dysesthesias (Unpleasant sensation to touch)","Hyperalgesia (Increased sensitivity to pain)","Hyperesthesia (Increased sensitivity to touch)","Hyperpathia (Painful, exaggerated response to nociceptive stimuli)","Hypoalgesia (Decreased sensitivity to painful stimuli)","Hypoesthesia/Hypesthesia (Reduced sensation to touch)","Paresthesia (Burning sensation)","Paresthesia (Tingling sensation)","WNL","No anomaly detected","Unimpaired"} Palpation: {Blank single:19197::"Tender","Complains of area being tender to palpation","Uncomfortable","Positive","Negative","Increased muscle tone","Trigger Point","Muscular Atrophy","Non-tender","No complaints of tenderness","Hyperthermic","Hypothermic","Euthermic","No palpable anomalies"} {Blank single:19197::"Negative cervical compression test","Cervical compression test positive","Negative Tinel's test","Tinel's test","Negative Trousseau's test","Trousseau's test (3 minute blood pressure cuff test) for hypocalcemia","     "} {Blank single:19197::"for right occipital neuralgia","for left occipital neuralgia","for bilateral occipital neuralgia","for cervical facet disease","     "}  Upper Extremity (UE) Exam    Side: Right upper extremity  Side: Left upper extremity  Inspection: {Blank single:19197::"Evidence of prior arthroplastic surgery","Below elbow amputation (BEA)","Above elbow amputation (AEA)","Contracture","Atrophy","Dystrophy","Degenerative arthropathy with ulnar deviation","Degenerative deforming arthropathy","Heberden's nodes (DIP)","Bouchard's nodes (PIP)","No gross anomalies detected","Edema","Positive color changes","Some redness observed","Increased temperature","Acrocyanosis","Normal skin color, temperature, and hair growth. No peripheral  edema or cyanosis","No masses, redness, swelling, or asymmetry. No contractures"}  Inspection: {Blank single:19197::"Evidence of prior arthroplastic surgery","Below elbow amputation (BEA)","Above elbow amputation (AEA)","Contracture","Atrophy","Dystrophy","Degenerative arthropathy with  ulnar deviation","Degenerative deforming arthropathy","Heberden's nodes (DIP)","Bouchard's nodes (PIP)","No gross anomalies detected","Edema","Positive color changes","Some redness observed","Increased temperature","Acrocyanosis","Normal skin color, temperature, and hair growth. No peripheral edema or cyanosis","No masses, redness, swelling, or asymmetry. No contractures"}  Functional ROM: {Blank single:19197::"Improved after treatment","Impaired ROM","Adequate ROM","Decreased ROM","Diminished ROM","Full ROM","Fused","Grossly intact ROM","Guarding","Limited ROM","Mechanically restricted ROM","Minimal ROM","Pain restricted ROM","Restricted ROM","Zero ROM","ROM appears unrestricted","ROM is within functional limits (WFL)","ROM is within normal limits (WNL)","Unrestricted ROM"} {Blank single:19197::"for shoulder","for elbow","for shoulder and elbow","for wrist","for wrist and hand","for hand","for all joints of upper extremity","       "}  Functional ROM: {Blank single:19197::"Improved after treatment","Impaired ROM","Adequate ROM","Decreased ROM","Diminished ROM","Full ROM","Fused","Grossly intact ROM","Guarding","Limited ROM","Mechanically restricted ROM","Minimal ROM","Pain restricted ROM","Restricted ROM","Zero ROM","ROM appears unrestricted","ROM is within functional limits (WFL)","ROM is within normal limits (WNL)","Unrestricted ROM"} {Blank single:19197::"for shoulder","for elbow","for shoulder and elbow","for wrist","for wrist and hand","for hand","for all joints of upper extremity","       "}  Muscle strength & Tone: {Blank single:19197::"Inconsistent level of performance when tested","Normal strength (5/5)","Movement possible  against some resistance (4/5)","Movement possible against gravity, but not against resistance (3/5)","Movement possible, but not against gravity (0/1)","[id number] flickering, but no movement (1/5)","No motor contraction (0/5)","Flaccid paralysis","Spastic paralysis","Cogwheel rigidity","Clasp-knife rigidity","Give-away weakness","Deconditioned","Mild-to-medarate deconditioning","Moderate-to-severe deconditioning","Guarding","WNL","Unremarkable","Grossly normal","Grossly intact","Functionally intact"}  Muscle strength & Tone: {Blank single:19197::"Inconsistent level of performance when tested","Normal strength (5/5)","Movement possible against some resistance (4/5)","Movement possible against gravity, but not against resistance (3/5)","Movement possible, but not against gravity (3/0)","SPQZRA flickering, but no movement (1/5)","No motor contraction (0/5)","Flaccid paralysis","Spastic paralysis","Cogwheel rigidity","Clasp-knife rigidity","Give-away weakness","Deconditioned","Mild-to-medarate deconditioning","Moderate-to-severe deconditioning","Guarding","WNL","Unremarkable","Grossly normal","Grossly intact","Functionally intact"}  Sensory: {Blank single:19197::"Improved","Movement-associated pain","Movement-associated discomfort","Impaired sensorium","Articular pain pattern","Arthropathic arthralgia","Dermatomal pain pattern","Non-dermatomal pain pattern","Musculoskeletal pain pattern","Myotome pain pattern","Neurogenic pain pattern","Neuropathic pain pattern","Referred pain pattern","Visceral pain pattern","Allodynia (Painful response to non-painful stimuli)","Anesthesia (Absence of sensation)","Anesthesia Dolorosa (Numbness over painful area)","Dysesthesias (Unpleasant sensation to touch)","Hyperalgesia (Increased sensitivity to pain)","Hyperesthesia (Increased sensitivity to touch)","Hyperpathia (Painful, exaggerated response to nociceptive stimuli)","Hypoalgesia (Decreased sensitivity to painful  stimuli)","Hypoesthesia/Hypesthesia (Reduced sensation to touch)","Paresthesia (Burning sensation)","Paresthesia (Tingling sensation)","WNL","No anomaly detected","Unimpaired"}  Sensory: {Blank single:19197::"Improved","Movement-associated pain","Movement-associated discomfort","Impaired sensorium","Articular pain pattern","Arthropathic arthralgia","Dermatomal pain pattern","Non-dermatomal pain pattern","Musculoskeletal pain pattern","Myotome pain pattern","Neurogenic pain pattern","Neuropathic pain pattern","Referred pain pattern","Visceral pain pattern","Allodynia (Painful response to non-painful stimuli)","Anesthesia (Absence of sensation)","Anesthesia Dolorosa (Numbness over painful area)","Dysesthesias (Unpleasant sensation to touch)","Hyperalgesia (Increased sensitivity to pain)","Hyperesthesia (Increased sensitivity to touch)","Hyperpathia (Painful, exaggerated response to nociceptive stimuli)","Hypoalgesia (Decreased sensitivity to painful stimuli)","Hypoesthesia/Hypesthesia (Reduced sensation to touch)","Paresthesia (Burning sensation)","Paresthesia (Tingling sensation)","WNL","No anomaly detected","Unimpaired"}  Palpation: {Blank single:19197::"Tender","Complains of area being tender to palpation","Uncomfortable","Positive","Negative","Increased muscle tone","Trigger Point","Muscular Atrophy","Non-tender","No complaints of tenderness","Hyperthermic","Hypothermic","Euthermic","No palpable anomalies"} {Blank single:19197::"Tinel's test","Trousseau's test (3 minute blood pressure cuff test) for hypocalcemia","     "} {Blank single:19197::"for right CTS","for left CTS","for bilateral CTS","     "}  Palpation: {Blank single:19197::"Tender","Complains of area being tender to palpation","Uncomfortable","Positive","Negative","Increased muscle tone","Trigger Point","Muscular Atrophy","Non-tender","No complaints of tenderness","Hyperthermic","Hypothermic","Euthermic","No palpable anomalies"} {Blank  single:19197::"Tinel's test","Trousseau's test (3 minute blood pressure cuff test) for hypocalcemia","     "} {Blank single:19197::"for right CTS","for left CTS","for bilateral CTS","     "}  Specialized Test(s): {Blank single:19197::"Tinel's","Phalen's","Tinel's/Phalen's","Deferred"} {Blank single:19197::"(+)","(-)","(+)/(-)","(-)/(+)","      "}  Specialized Test(s): {Blank single:19197::"Tinel's","Phalen's","Tinel's/Phalen's","Deferred"} {Blank single:19197::"(+)","(-)","(+)/(-)","(-)/(+)","      "}   Thoracic Spine Exam  Inspection: {Blank single:19197::"Well healed scar from previous spine surgery detected","prominent thoracic Kyphosis","Significant thoracic kyphosis","Paravertebral muscle atrophy","No masses, redness, or swelling"} Alignment: {Blank single:19197::"Asymmetric","Symmetrical"} Functional ROM: {Blank single:19197::"Improved after treatment","Adequate ROM","Decreased ROM","Diminished ROM","Full ROM","Fused","Grossly intact ROM","Guarding","Limited ROM","Mechanically restricted ROM","Minimal ROM","Pain restricted ROM","Restricted ROM","Zero ROM","ROM appears unrestricted","ROM is within functional limits (WFL)","ROM is within normal limits (WNL)","Unrestricted ROM"} Stability: {Blank single:19197::"Possibly unstable","No instability detected"} Sensory: {Blank single:19197::"Improved","Movement associated pain","Movement associated discomfort","Impaired  sensorium","Articular pain pattern","Dermatomal pain pattern","Musculoskeletal pain pattern","Myotome pain pattern","Neurogenic pain pattern","Neuropathic pain pattern","Referred pain pattern","Visceral pain pattern","Allodynia (Painful response to non-painful stimuli)","Anesthesia (Absence of sensation)","Anesthesia Dolorosa (Numbness over painful area)","Dysesthesias (Unpleasant sensation to touch)","Hyperalgesia (Increased sensitivity to pain)","Hyperesthesia (Increased sensitivity to touch)","Hyperpathia (Painful, exaggerated response to  nociceptive stimuli)","Hypoalgesia (Decreased sensitivity to painful stimuli)","Hypoesthesia/Hypesthesia (Reduced sensation to touch)","Paresthesia (Burning sensation)","Paresthesia (Tingling sensation)","WNL","No anomaly detected","Unimpaired"} Muscle strength & Tone: {Blank single:19197::"Tender","Complains of area being tender to palpation","Uncomfortable","Positive","Negative","Increased muscle tone","Trigger Point","Muscular Atrophy","Non-tender","No complaints of tenderness","Hyperthermic","Hypothermic","Euthermic","No palpable anomalies"}  Lumbar Spine Exam  Inspection: {Blank single:19197::"Well healed scar from previous spine surgery detected","Thoraco-lumbar Scoliosis","Lumbar Scoliosis","Paravertebral muscle atrophy","No masses, redness, or swelling"} Alignment: {Blank single:19197::"Scoliosis detected","Levoscoliosis","Dextroscoliosis","Asymmetric","Symmetrical"} Functional ROM: {Blank single:19197::"Improved after treatment","Adequate ROM","Decreased ROM","Diminished ROM","Full ROM","Fused","Grossly intact ROM","Guarding","Limited ROM","Mechanically restricted ROM","Minimal ROM","Pain restricted ROM","Restricted ROM","Zero ROM","ROM appears unrestricted","ROM is within functional limits (WFL)","ROM is within normal limits (WNL)","Unrestricted ROM"}{Blank single:19197::", to the right",", to the left",", bilaterally","     "} Stability: {Blank single:19197::"Possibly unstable","No instability detected"} Muscle strength & Tone: {Blank single:19197::"Increased muscle tone over affected area","Inconsistent level of performance when tested","Functionally intact"} Sensory: {Blank single:19197::"Improved","Movement-associated pain","Movement-associated discomfort","Impaired sensorium","Articular pain pattern","Dermatomal pain pattern","Musculoskeletal pain pattern","Myotome pain pattern","Neurogenic pain pattern","Neuropathic pain pattern","Referred pain pattern","Visceral pain pattern","Allodynia  (Painful response to non-painful stimuli)","Anesthesia (Absence of sensation)","Anesthesia Dolorosa (Numbness over painful area)","Dysesthesias (Unpleasant sensation to touch)","Hyperalgesia (Increased sensitivity to pain)","Hyperesthesia (Increased sensitivity to touch)","Hyperpathia (Painful, exaggerated response to nociceptive stimuli)","Hypoalgesia (Decreased sensitivity to painful stimuli)","Hypoesthesia/Hypesthesia (Reduced sensation to touch)","Paresthesia (Burning sensation)","Paresthesia (Tingling sensation)","WNL","No anomaly detected","Unimpaired"} Palpation: {Blank single:19197::"Tender","Complains of area being tender to palpation","Uncomfortable","Positive","Negative","Increased muscle tone","Trigger Point","Muscular Atrophy","Non-tender","No complaints of tenderness","Hyperthermic","Hypothermic","Euthermic","No palpable anomalies"} {Blank single:19197::"Right Fist Percussion Test","Left Fist Percussion Test","Bilateral Fist Percussion Test","     "} Provocative Tests: Lumbar Hyperextension and rotation test: {Blank single:19197::"Positive","Negative","Equivocal","Improved after treatment","Unable to perform","Non-contributory","improved","worsened","no change from prior assessment","evaluation deferred today"} {Blank single:19197::"bilaterally for facet joint pain.","on the right for facet joint pain.","on the left for facet joint pain.","due to pain.","due to fusion restriction.","     "} Patrick's Maneuver: {Blank single:19197::"Positive","Negative","Non-diagnostic","Improved after treatment","Unable to perform","worsened","unimproved","Unchanged","no change from prior assessment","evaluation deferred today"} {Blank single:19197::"for bilateral S-I arthralgia","for right-sided S-I arthralgia","for left-sided S-I arthralgia","     "} {Blank single:19197::"and","     "} {Blank single:19197::"for bilateral hip arthralgia","for right hip arthralgia","for left hip arthralgia","due to pain","due to  fusion restriction","     "}  Gait & Posture Assessment  Ambulation: {Blank single:19197::"Limited","Patient ambulates using crutches","Patient ambulates using a walker","Patient ambulates using a wheel chair","Patient came in today in a wheel chair","Incapable of ambulation without assistance","Nonfunctional","Dependent, Level II (constant assistance required)","Dependent, Level I (intermittent assistance required)","Dependent, Supervision required","Independent, Level surfaces only","Independent, Level and Non-level surfaces","Unassisted","Patient ambulates using a cane"} Gait: {Blank single:19197::"Age-related, senile gait pattern","Antalgic","Limited. Using assistive device to ambulate","Very limited, using assistive device to ambulate","Improved after treatment","Awkward","Antalgic gait (limping)","Ataxia","Ataxia (Appendicular)","Ataxia (Cerebellar)","Ataxia (Friedreich's)","Ataxia (Sensory)","Ataxia (Vestibular)","Ataxia (Truncal)("Drunken sailor" gait)","Cautious","Clumsy","Staggering","Stumbling","Uneven","Charlie"Chaplin (due to tibial torsion)","Choreic","Circumduction gait (due to hemiplegia)","Compensatory","Dystaxia","Dystonic","Frontal gait (Apraxia)","Hemiataxia","Hemiparetic","High stepping gait (foot drop)","Paraparetic","Parkinsonian","Psychogenic","Scissor gait (cerebral palsy)","Shuffling gait","Steppage","Stiff hip gait (hip ankylosis)","Trendelenburg (unstable hip)","Waddling (Hip pathology)","Grossly intact","Functionally WNL","Unaffected","Relatively normal for age and body habitus","Modified gait pattern (slower gait speed, wider stride width, and longer stance duration) associated with morbid obesity"} Posture: {Blank single:19197::"Antalgic","Difficulty standing up straight, due to pain","Positive Romberg's test (Sensory Ataxia)(Worsening of balance and pointing with eyes closed)","Painful","Recombent","Relaxed","Tense","Difficulty with positional changes","Sway back","Lumbar  lordosis","Thoracic kyphosis","Kyphosis-lordosis","Flat back","Forward head","Neutral Spine","Slouching","Drooping","Rigid","Good","WNL","Poor"}   Lower Extremity Exam    Side: Right lower extremity  Side: Left lower extremity  Inspection: {Blank single:19197::"Evidence of prior arthroplastic surgery","Below knee amputation (BKA)","Above knee amputation (AKA)","Contracture","Atrophy","Dystrophy","No gross anomalies detected","Edema","Pitting edema","Venous stasis edema","Positive color changes","Some redness observed","Increased temperature","Acrocyanosis","Normal skin color, temperature, and  hair growth. No peripheral edema or cyanosis","No masses, redness, swelling, or asymmetry. No contractures"}  Inspection: {Blank single:19197::"Evidence of prior arthroplastic surgery","Below knee amputation (BKA)","Above knee amputation (AKA)","Contracture","Atrophy","Dystrophy","No gross anomalies detected","Edema","Pitting edema","Venous stasis edema","Positive color changes","Some redness observed","Increased temperature","Acrocyanosis","Normal skin color, temperature, and hair growth. No peripheral edema or cyanosis","No masses, redness, swelling, or asymmetry. No contractures"}  Functional ROM: {Blank single:19197::"Improved after treatment","Impaired ROM","Adequate ROM","Decreased ROM","Diminished ROM","Full ROM","Fused","Grossly intact ROM","Guarding","Limited ROM","Mechanically restricted ROM","Minimal ROM","Pain restricted ROM","Restricted ROM","Zero ROM","ROM appears unrestricted","ROM is within functional limits (WFL)","ROM is within normal limits (WNL)","Unrestricted ROM"} {Blank single:19197::"for hip joint","for knee joint","for hip and knee joints","for all joints of the lower extremity","       "}  Functional ROM: {Blank single:19197::"Improved after treatment","Impaired ROM","Adequate ROM","Decreased ROM","Diminished ROM","Full ROM","Fused","Grossly intact ROM","Guarding","Limited ROM","Mechanically  restricted ROM","Minimal ROM","Pain restricted ROM","Restricted ROM","Zero ROM","ROM appears unrestricted","ROM is within functional limits (WFL)","ROM is within normal limits (WNL)","Unrestricted ROM"} {Blank single:19197::"for hip joint","for knee joint","for hip and knee joints","for all joints of the lower extremity","       "}  Muscle strength & Tone: {Blank single:19197::"Able to Toe-walk & Heel-walk without problems","Foot drop","L2 weakness","L3 weakness","L4 weakness","L5 weakness","S1 weakness","Give-away weakness","Deconditioned","Mild-to-moderate deconditioning","Moderate-to-severe deconditioning","Guarding","Inconsistent level of performance when tested","Normal strength (5/5)","Movement possible against some resistance (4/5)","Movement possible against gravity, but not against resistance (3/5)","Movement possible, but not against gravity (9/4)","BSJGGE flickering, but no movement (1/5)","No motor contraction (0/5)","Flaccid paralysis","Spastic paralysis","Cogwheel rigidity","Clasp-knife rigidity","WNL","Unremarkable","Grossly normal","Grossly intact","Functionally intact"}  Muscle strength & Tone: {Blank single:19197::"Able to Toe-walk & Heel-walk without problems","Foot drop","L2 weakness","L3 weakness","L4 weakness","L5 weakness","S1 weakness","Give-away weakness","Deconditioned","Mild-to-moderate deconditioning","Moderate-to-severe deconditioning","Guarding","Inconsistent level of performance when tested","Normal strength (5/5)","Movement possible against some resistance (4/5)","Movement possible against gravity, but not against resistance (3/5)","Movement possible, but not against gravity (3/6)","OQHUTM flickering, but no movement (1/5)","No motor contraction (0/5)","Flaccid paralysis","Spastic paralysis","Cogwheel rigidity","Clasp-knife rigidity","WNL","Unremarkable","Grossly normal","Grossly intact","Functionally intact"}  Sensory: {Blank single:19197::"Improved","Movement-associated  pain","Movement-associated discomfort","Impaired sensorium","Articular pain pattern","Arthropathic arthralgia","Dermatomal pain pattern","Non-dermatomal pain pattern","Musculoskeletal pain pattern","Myotome pain pattern","Neurogenic pain pattern","Neuropathic pain pattern","Referred pain pattern","Visceral pain pattern","Allodynia (Painful response to non-painful stimuli)","Anesthesia (Absence of sensation)","Anesthesia Dolorosa (Numbness over painful area)","Dysesthesias (Unpleasant sensation to touch)","Hyperalgesia (Increased sensitivity to pain)","Hyperesthesia (Increased sensitivity to touch)","Hyperpathia (Painful, exaggerated response to nociceptive stimuli)","Hypoalgesia (Decreased sensitivity to painful stimuli)","Hypoesthesia/Hypesthesia (Reduced sensation to touch)","Paresthesia (Burning sensation)","Paresthesia (Tingling sensation)","WNL","No anomaly detected","Unimpaired"}  Sensory: {Blank single:19197::"Improved","Movement-associated pain","Movement-associated discomfort","Impaired sensorium","Articular pain pattern","Arthropathic arthralgia","Dermatomal pain pattern","Non-dermatomal pain pattern","Musculoskeletal pain pattern","Myotome pain pattern","Neurogenic pain pattern","Neuropathic pain pattern","Referred pain pattern","Visceral pain pattern","Allodynia (Painful response to non-painful stimuli)","Anesthesia (Absence of sensation)","Anesthesia Dolorosa (Numbness over painful area)","Dysesthesias (Unpleasant sensation to touch)","Hyperalgesia (Increased sensitivity to pain)","Hyperesthesia (Increased sensitivity to touch)","Hyperpathia (Painful, exaggerated response to nociceptive stimuli)","Hypoalgesia (Decreased sensitivity to painful stimuli)","Hypoesthesia/Hypesthesia (Reduced sensation to touch)","Paresthesia (Burning sensation)","Paresthesia (Tingling sensation)","WNL","No anomaly detected","Unimpaired"}  Palpation: {Blank single:19197::"Tender","Complains of area being tender to  palpation","Uncomfortable","Positive","Negative","Increased muscle tone","Trigger Point","Muscular Atrophy","Non-tender","No complaints of tenderness","Hyperthermic","Hypothermic","Euthermic","No palpable anomalies"}  Palpation: {Blank single:19197::"Tender","Complains of area being tender to palpation","Uncomfortable","Positive","Negative","Increased muscle tone","Trigger Point","Muscular Atrophy","Non-tender","No complaints of tenderness","Hyperthermic","Hypothermic","Euthermic","No palpable anomalies"}   Assessment  Primary Diagnosis & Pertinent Problem List: There were no encounter diagnoses.  Status Diagnosis  {Problem Stability:19197::"Improving","Improved","Not improving","Not responding","Persistent","Recurring","Reoccurring","Deteriorating","Having a Flare-up","Responding","Resolved","Stable","Unimproved","Worsening","Controlled"} {Problem Stability:19197::"Improving","Improved","Not improving","Not responding","Persistent","Recurring","Reoccurring","Deteriorating","Having a Flare-up","Responding","Resolved","Stable","Unimproved","Worsening","Controlled"} {Problem Stability:19197::"Improving","Improved","Not improving","Not responding","Persistent","Recurring","Reoccurring","Deteriorating","Having a Flare-up","Responding","Resolved","Stable","Unimproved","Worsening","Controlled"} No diagnosis found.  Problems updated and reviewed during this visit: No problems updated. Plan of Care  Pharmacotherapy (Medications Ordered): No orders of the defined types were placed in this encounter.  Lab-work, procedure(s), and/or referral(s): No orders of the defined types were placed in this encounter.   Pharmacological management options:  Opioid Analgesics: {Blank single:19197::"Not an appropriate candidate for opioid therapy","I will not be prescribing any opioids at this time","Not indicated at this time","Medically contraindicated","Tried and  failed","Ms. Pillard would like to stay away from  opioids, if at all possible","Patient still has some left, we'll take over on next visit","At this time, I do not recommend the use of opioids on this patient","Options discussed, Ms. Matzke would prefer avoiding them","We'll take over management today. See above orders"} Membrane stabilizer: {Blank single:19197::"I will not be prescribing any at this time","None prescribed at this time","Will not be prescribed.","Not indicated","Medically contraindicated","Tried and failed","Currently on an appropriate regimen","Options discussed, Ms. Crabbe would prefer to avaoid taking any","We have discussed the possibility of optimizing this mode of therapy, if tolerated"} Muscle relaxant: {Blank single:19197::"I will not be prescribing any at this time","None prescribed at this time","Will not be prescribed.","Not indicated","Medically contraindicated","Tried and failed","Currently on an appropriate regimen","Options discussed, Ms. Bertelson would prefer not taking any","We have discussed the possibility of a trial"} NSAID: {Blank single:19197::"I will not be prescribing any at this time","None prescribed at this time","Will not be prescribed.","Not indicated","Medically contraindicated","Tried and failed","Currently on an appropriate regimen","Options discussed, Ms. Schrag would prefer avoiding them","We have discussed the possibility of a trial"} Other analgesic(s): {Blank single:19197::"I will not be prescribing any at this time","None prescribed at this time","Will not be prescribed.","Not indicated","Medically contraindicated","Tried and failed","Some options discussed, Ms. Rodd would prefer taking as few medications as possible","To be determined at a later time"}   Interventional management options: Planned, scheduled, and/or pending:    ***   Considering:   ***   PRN Procedures:   {Blank single:19197::"Not indicated","Medically contraindicated","On anticoagulants","To be determined at a later time"}    Provider-requested follow-up: No Follow-up on file.  Future Appointments Date Time Provider [place]  03/22/2017 1:00 PM Milinda Pointer, MD [facility] None  06/08/2017 1:30 PM Milinda Pointer, MD [facility] None    Primary Care Physician: Denton Lank, MD Location: [facility] Note by: Gillis Santa, M.D Date: 03/22/2017; Time: 11:50 AM  There are no Patient Instructions on file for this visit.

## 2017-03-22 NOTE — Progress Notes (Signed)
Patient's Name: Zoe Sanchez  MRN: [id number]  Referring Provider: Denton Lank, MD  DOB: 12/09/1955  PCP: Denton Lank, MD  DOS: 03/22/2017  Note by: Gaspar Cola, MD  Service setting: Ambulatory outpatient  Specialty: Interventional Pain Management  Patient type: Established  Location: [facility] (AMB) Pain Management Facility  Visit type: Interventional Procedure   Primary Reason for Visit: Interventional Pain Management Treatment. CC: Back Pain (lower right)  Procedure:  Anesthesia, Analgesia, Anxiolysis:  Type: Diagnostic Medial Branch Facet Block (NO STEROIDS) Region: Lumbar Level: L2, L3, L4, L5, & S1 Medial Branch Level(s) Laterality: Bilateral  Type: Local Anesthesia with Moderate (Conscious) Sedation Local Anesthetic: Lidocaine 1% Route: Intravenous (IV) IV Access: Secured Sedation: Meaningful verbal contact was maintained at all times during the procedure  Indication(s): Analgesia and Anxiety  Indications: 1. Lumbar facet syndrome (Right)   2. Lumbar spondylosis   3. Chronic low back pain (Location of Tertiary source of pain) (Bilateral) (R>L)    Pain Score: Pre-procedure: 3 /10 Post-procedure: 0-No pain/10  Pre-op Assessment:  Previous date of service: 03/02/17 Service provided: Med Refill Zoe Sanchez is a 61 y.o. (year old), female patient, seen today for interventional treatment. She  has a past surgical history that includes Abdominal hysterectomy (1986); Tonsillectomy (1957); Skin lesion excision; Breast surgery; and Breast biopsy (Right, 2016). Zoe Sanchez has a current medication list which includes the following prescription(s): amitriptyline, betamethasone dipropionate, vitamin d3, cyanocobalamin, cyclobenzaprine, desonide, dicyclomine, estradiol, fluticasone, gemfibrozil, hydrocodone-acetaminophen, hydrocodone-acetaminophen, hydrocodone-acetaminophen, incruse ellipta, montelukast, omeprazole, pregabalin, pregabalin, propranolol, propranolol er, and salmeterol, and  the following Facility-Administered Medications: fentanyl and midazolam. Her primarily concern today is the Back Pain (lower right)  Initial Vital Signs: Blood pressure 121/61, pulse 71, temperature 98.1 F (36.7 C), temperature source Oral, resp. rate 16, height 5' 2.5" (1.588 m), weight 157 lb (71.2 kg), SpO2 98 %. BMI: 28.26 kg/m  Risk Assessment: Allergies: Reviewed. She is allergic to prednisone and other.  Allergy Precautions: No steroids used. Coagulopathies: Reviewed. None identified.  Blood-thinner therapy: None at this time Active Infection(s): Reviewed. None identified. Zoe Sanchez is afebrile  Site Confirmation: Zoe Sanchez was asked to confirm the procedure and laterality before marking the site Procedure checklist: Completed Consent: Before the procedure and under the influence of no sedative(s), amnesic(s), or anxiolytics, the patient was informed of the treatment options, risks and possible complications. To fulfill our ethical and legal obligations, as recommended by the American Medical Association's Code of Ethics, I have informed the patient of my clinical impression; the nature and purpose of the treatment or procedure; the risks, benefits, and possible complications of the intervention; the alternatives, including doing nothing; the risk(s) and benefit(s) of the alternative treatment(s) or procedure(s); and the risk(s) and benefit(s) of doing nothing. The patient was provided information about the general risks and possible complications associated with the procedure. These may include, but are not limited to: failure to achieve desired goals, infection, bleeding, organ or nerve damage, allergic reactions, paralysis, and death. In addition, the patient was informed of those risks and complications associated to Spine-related procedures, such as failure to decrease pain; infection (i.e.: Meningitis, epidural or intraspinal abscess); bleeding (i.e.: epidural hematoma,  subarachnoid hemorrhage, or any other type of intraspinal or peri-dural bleeding); organ or nerve damage (i.e.: Any type of peripheral nerve, nerve root, or spinal cord injury) with subsequent damage to sensory, motor, and/or autonomic systems, resulting in permanent pain, numbness, and/or weakness of one or several areas of the body; allergic reactions; (i.e.: anaphylactic reaction);  and/or death. Furthermore, the patient was informed of those risks and complications associated with the medications. These include, but are not limited to: allergic reactions (i.e.: anaphylactic or anaphylactoid reaction(s)); adrenal axis suppression; blood sugar elevation that in diabetics may result in ketoacidosis or comma; water retention that in patients with history of congestive heart failure may result in shortness of breath, pulmonary edema, and decompensation with resultant heart failure; weight gain; swelling or edema; medication-induced neural toxicity; particulate matter embolism and blood vessel occlusion with resultant organ, and/or nervous system infarction; and/or aseptic necrosis of one or more joints. Finally, the patient was informed that Medicine is not an exact science; therefore, there is also the possibility of unforeseen or unpredictable risks and/or possible complications that may result in a catastrophic outcome. The patient indicated having understood very clearly. We have given the patient no guarantees and we have made no promises. Enough time was given to the patient to ask questions, all of which were answered to the patient's satisfaction. Zoe Sanchez has indicated that she wanted to continue with the procedure. Attestation: I, the ordering provider, attest that I have discussed with the patient the benefits, risks, side-effects, alternatives, likelihood of achieving goals, and potential problems during recovery for the procedure that I have provided informed consent. Date: 03/22/2017; Time: 1:36  PM  Pre-Procedure Preparation:  Monitoring: As per clinic protocol. Respiration, ETCO2, SpO2, BP, heart rate and rhythm monitor placed and checked for adequate function Safety Precautions: Patient was assessed for positional comfort and pressure points before starting the procedure. Time-out: I initiated and conducted the "Time-out" before starting the procedure, as per protocol. The patient was asked to participate by confirming the accuracy of the "Time Out" information. Verification of the correct person, site, and procedure were performed and confirmed by me, the nursing staff, and the patient. "Time-out" conducted as per Joint Commission's Universal Protocol (UP.01.01.01). "Time-out" Date & Time: 03/22/2017; 1400 hrs.  Description of Procedure Process:   Position: Prone Target Area: For Lumbar Facet blocks, the target is the groove formed by the junction of the transverse process and superior articular process. For the L5 dorsal ramus, the target is the notch between superior articular process and sacral ala. For the S1 dorsal ramus, the target is the superior and lateral edge of the posterior S1 Sacral foramen. Approach: Paramedial approach. Area Prepped: Entire Posterior Lumbosacral Region Prepping solution: ChloraPrep (2% chlorhexidine gluconate and 70% isopropyl alcohol) Safety Precautions: Aspiration looking for blood return was conducted prior to all injections. At no point did we inject any substances, as a needle was being advanced. No attempts were made at seeking any paresthesias. Safe injection practices and needle disposal techniques used. Medications properly checked for expiration dates. SDV (single dose vial) medications used. Description of the Procedure: Protocol guidelines were followed. The patient was placed in position over the fluoroscopy table. The target area was identified and the area prepped in the usual manner. Skin desensitized using vapocoolant spray. Skin & deeper  tissues infiltrated with local anesthetic. Appropriate amount of time allowed to pass for local anesthetics to take effect. The procedure needle was introduced through the skin, ipsilateral to the reported pain, and advanced to the target area. Employing the "Medial Branch Technique", the needles were advanced to the angle made by the superior and medial portion of the transverse process, and the lateral and inferior portion of the superior articulating process of the targeted vertebral bodies. This area is known as "Burton's Eye" or the "Eye of the  Scottish Dog". A procedure needle was introduced through the skin, and this time advanced to the angle made by the superior and medial border of the sacral ala, and the lateral border of the S1 vertebral body. This last needle was later repositioned at the superior and lateral border of the posterior S1 foramen. Negative aspiration confirmed. Solution injected in intermittent fashion, asking for systemic symptoms every 0.5cc of injectate. The needles were then removed and the area cleansed, making sure to leave some of the prepping solution back to take advantage of its long term bactericidal properties.   Illustration of the posterior view of the lumbar spine and the posterior neural structures. Laminae of L2 through S1 are labeled. DPRL5, dorsal primary ramus of L5; DPRS1, dorsal primary ramus of S1; DPR3, dorsal primary ramus of L3; FJ, facet (zygapophyseal) joint L3-L4; I, inferior articular process of L4; LB1, lateral branch of dorsal primary ramus of L1; IAB, inferior articular branches from L3 medial branch (supplies L4-L5 facet joint); IBP, intermediate branch plexus; MB3, medial branch of dorsal primary ramus of L3; NR3, third lumbar nerve root; S, superior articular process of L5; SAB, superior articular branches from L4 (supplies L4-5 facet joint also); TP3, transverse process of L3.  Vitals:   03/22/17 1417 03/22/17 1427 03/22/17 1437 03/22/17 1447    BP: 124/89 133/64 133/64 137/63  Pulse:      Resp: (!) 7 14 16 16   Temp:  (!) 97.4 F (36.3 C)    TempSrc:      SpO2: 93% 99% 99% 100%  Weight:      Height:        Start Time: 1403 hrs. End Time: 1415 hrs. Materials:  Needle(s) Type: Regular needle Gauge: 22G Length: 3.5-in Medication(s): We administered lactated ringers, midazolam, fentaNYL, lidocaine, ropivacaine (PF) 2 mg/mL (0.2%), and ropivacaine (PF) 2 mg/mL (0.2%). Please see chart orders for dosing details.  Imaging Guidance (Spinal):  Type of Imaging Technique: Fluoroscopy Guidance (Spinal) Indication(s): Assistance in needle guidance and placement for procedures requiring needle placement in or near specific anatomical locations not easily accessible without such assistance. Exposure Time: Please see nurses notes. Contrast: None used. Fluoroscopic Guidance: I was personally present during the use of fluoroscopy. "Tunnel Vision Technique" used to obtain the best possible view of the target area. Parallax error corrected before commencing the procedure. "Direction-depth-direction" technique used to introduce the needle under continuous pulsed fluoroscopy. Once target was reached, antero-posterior, oblique, and lateral fluoroscopic projection used confirm needle placement in all planes. Images permanently stored in EMR. Interpretation: No contrast injected. I personally interpreted the imaging intraoperatively. Adequate needle placement confirmed in multiple planes. Permanent images saved into the patient's record.  Antibiotic Prophylaxis:  Indication(s): None identified Antibiotic given: None  Post-operative Assessment:  EBL: None Complications: No immediate post-treatment complications observed by team, or reported by patient. Note: The patient tolerated the entire procedure well. A repeat set of vitals were taken after the procedure and the patient was kept under observation following institutional policy, for this type  of procedure. Post-procedural neurological assessment was performed, showing return to baseline, prior to discharge. The patient was provided with post-procedure discharge instructions, including a section on how to identify potential problems. Should any problems arise concerning this procedure, the patient was given instructions to immediately contact us, at any time, without hesitation. In any case, we plan to contact the patient by telephone for a follow-up status report regarding this interventional procedure. Comments:  No additional relevant information.  Plan of  Care  Disposition: Discharge home  Discharge Date & Time: 03/22/2017; 1450 hrs.  Physician-requested Follow-up:  Return for post-procedure eval (in 2 wks), by MD.  Future Appointments Date Time Provider [place]  04/12/2017 9:00 AM Milinda Pointer, MD [facility] None  06/08/2017 1:30 PM Milinda Pointer, MD [facility] None   Medications ordered for procedure: Meds ordered this encounter  Medications  . lactated ringers infusion 1,000 mL  . midazolam (VERSED) 5 MG/5ML injection 1-2 mg    Make sure Flumazenil is available in the pyxis when using this medication. If oversedation occurs, administer 0.2 mg IV over 15 sec. If after 45 sec no response, administer 0.2 mg again over 1 min; may repeat at 1 min intervals; not to exceed 4 doses (1 mg)  . fentaNYL (SUBLIMAZE) injection 25-50 mcg    Make sure Narcan is available in the pyxis when using this medication. In the event of respiratory depression (RR< 8/min): Titrate NARCAN (naloxone) in increments of 0.1 to 0.2 mg IV at 2-3 minute intervals, until desired degree of reversal.  . lidocaine 1.5 % injection 20 mL    From block tray  . ropivacaine (PF) 2 mg/mL (0.2%) (NAROPIN) injection 9 mL  . ropivacaine (PF) 2 mg/mL (0.2%) (NAROPIN) injection 9 mL   Medications administered: We administered lactated ringers, midazolam, fentaNYL, lidocaine, ropivacaine (PF) 2 mg/mL  (0.2%), and ropivacaine (PF) 2 mg/mL (0.2%).  See the medical record for exact dosing, route, and time of administration.  Lab-work, Procedure(s), & Referral(s) Ordered: Orders Placed This Encounter  Procedures  . LUMBAR FACET(MEDIAL BRANCH NERVE BLOCK) MBNB  . DG C-Arm 1-60 Min-No Report  . Provider attestation of informed consent for procedure/surgical case  . Verify informed consent  . Discharge instructions  . Follow-up  . Informed Consent Details: Transcribe to consent form and obtain patient signature   Imaging Ordered: New Prescriptions   No medications on file   Primary Care Physician: Denton Lank, MD Location: [facility] Note by: Gaspar Cola, MD Date: 03/22/2017; Time: 6:13 PM  Disclaimer:  Medicine is not an Chief Strategy Officer. The only guarantee in medicine is that nothing is guaranteed. It is important to note that the decision to proceed with this intervention was based on the information collected from the patient. The Data and conclusions were drawn from the patient's questionnaire, the interview, and the physical examination. Because the information was provided in large part by the patient, it cannot be guaranteed that it has not been purposely or unconsciously manipulated. Every effort has been made to obtain as much relevant data as possible for this evaluation. It is important to note that the conclusions that lead to this procedure are derived in large part from the available data. Always take into account that the treatment will also be dependent on availability of resources and existing treatment guidelines, considered by other Pain Management Practitioners as being common knowledge and practice, at the time of the intervention. For Medico-Legal purposes, it is also important to point out that variation in procedural techniques and pharmacological choices are the acceptable norm. The indications, contraindications, technique, and results  of the above procedure should only be interpreted and judged by a Board-Certified Interventional Pain Specialist with extensive familiarity and expertise in the same exact procedure and technique.  Instructions provided at this appointment: Patient Instructions   ____________________________________________________________________________________________  Post-Procedure instructions Instructions:  Apply ice: Fill a plastic sandwich bag with crushed ice. Cover it with a small towel and apply to  injection site. Apply for 15 minutes then remove x 15 minutes. Repeat sequence on day of procedure, until you go to bed. The purpose is to minimize swelling and discomfort after procedure.  Apply heat: Apply heat to procedure site starting the day following the procedure. The purpose is to treat any soreness and discomfort from the procedure.  Food intake: Start with clear liquids (like water) and advance to regular food, as tolerated.   Physical activities: Keep activities to a minimum for the first 8 hours after the procedure.   Driving: If you have received any sedation, you are not allowed to drive for 24 hours after your procedure.  Blood thinner: Restart your blood thinner 6 hours after your procedure. (Only for those taking blood thinners)  Insulin: As soon as you can eat, you may resume your normal dosing schedule. (Only for those taking insulin)  Infection prevention: Keep procedure site clean and dry.  Post-procedure Pain Diary: Extremely important that this be done correctly and accurately. Recorded information will be used to determine the next step in treatment.  Pain evaluated is that of treated area only. Do not include pain from an untreated area.  Complete every hour, on the hour, for the initial 8 hours. Set an alarm to help you do this part accurately.  Do not go to sleep and have it completed later. It will not be accurate.  Follow-up appointment: Keep your follow-up  appointment after the procedure. Usually 2 weeks for most procedures. (6 weeks in the case of radiofrequency.) Bring you pain diary.  Expect:  From numbing medicine (AKA: Local Anesthetics): Numbness or decrease in pain.  Onset: Full effect within 15 minutes of injected.  Duration: It will depend on the type of local anesthetic used. On the average, 1 to 8 hours.   From steroids: Decrease in swelling or inflammation. Once inflammation is improved, relief of the pain will follow.  Onset of benefits: Depends on the amount of swelling present. The more swelling, the longer it will take for the benefits to be seen. In some cases, up to 10 days.  Duration: Steroids will stay in the system x 2 weeks. Duration of benefits will depend on multiple posibilities including persistent irritating factors.  From procedure: Some discomfort is to be expected once the numbing medicine wears off. This should be minimal if ice and heat are applied as instructed. Call if:  You experience numbness and weakness that gets worse with time, as opposed to wearing off.  New onset bowel or bladder incontinence. (Spinal procedures only)  Emergency Numbers:  Durning business hours (Monday - Thursday, 8:00 AM - 4:00 PM) (Friday, 9:00 AM - 12:00 Noon): [phone]  After hours: [phone] ____________________________________________________________________________________________  Pain Management Discharge Instructions  General Discharge Instructions :  If you need to reach your doctor call: Monday-Friday 8:00 am - 4:00 pm at [phone] or toll free [phone].  After clinic hours [phone] to have operator reach doctor.  Bring all of your medication bottles to all your appointments in the pain clinic.  To cancel or reschedule your appointment with Pain Management please remember to call 24 hours in advance to avoid a fee.  Refer to the educational materials which you have been given on:  General Risks, I had my Procedure. Discharge Instructions, Post Sedation.  Post Procedure Instructions:  The drugs you were given will stay in your system until tomorrow, so for the next 24 hours you should not drive, make any legal decisions or  drink any alcoholic beverages.  You may eat anything you prefer, but it is better to start with liquids then soups and crackers, and gradually work up to solid foods.  Please notify your doctor immediately if you have any unusual bleeding, trouble breathing or pain that is not related to your normal pain.  Depending on the type of procedure that was done, some parts of your body may feel week and/or numb.  This usually clears up by tonight or the next day.  Walk with the use of an assistive device or accompanied by an adult for the 24 hours.  You may use ice on the affected area for the first 24 hours.  Put ice in a Ziploc bag and cover with a towel and place against area 15 minutes on 15 minutes off.  You may switch to heat after 24 hours. Facet Joint Block The facet joints connect the bones of the spine (vertebrae). They make it possible for you to bend, twist, and make other movements with your spine. They also keep you from bending too far, twisting too far, and making other excessive movements. A facet joint block is a procedure where a numbing medicine (anesthetic) is injected into a facet joint. Often, a type of anti-inflammatory medicine called a steroid is also injected. A facet joint block may be done to diagnose neck or back pain. If the pain gets better after a facet joint block, it means the pain is probably coming from the facet joint. If the pain does not get better, it means the pain is probably not coming from the facet joint. A facet joint block may also be done to relieve neck or back pain caused by an inflamed facet joint. A facet joint block is only done to relieve pain if the pain does not improve with other methods, such as medicine,  exercise programs, and physical therapy. Tell a health care provider about:  Any allergies you have.  All medicines you are taking, including vitamins, herbs, eye drops, creams, and over-the-counter medicines.  Any problems you or family members have had with anesthetic medicines.  Any blood disorders you have.  Any surgeries you have had.  Any medical conditions you have.  Whether you are pregnant or may be pregnant. What are the risks? Generally, this is a safe procedure. However, problems may occur, including:  Bleeding.  Injury to a nerve near the injection site.  Pain at the injection site.  Weakness or numbness in areas controlled by nerves near the injection site.  Infection.  Temporary fluid retention.  Allergic reactions to medicines or dyes.  Injury to other structures or organs near the injection site.  What happens before the procedure?  Follow instructions from your health care provider about eating or drinking restrictions.  Ask your health care provider about: ? Changing or stopping your regular medicines. This is especially important if you are taking diabetes medicines or blood thinners. ? Taking medicines such as aspirin and ibuprofen. These medicines can thin your blood. Do not take these medicines before your procedure if your health care provider instructs you not to.  Do not take any new dietary supplements or medicines without asking your health care provider first.  Plan to have someone take you home after the procedure. What happens during the procedure?  You may need to remove your clothing and dress in an open-back gown.  The procedure will be done while you are lying on an X-ray table. You will most likely  be asked to lie on your stomach, but you may be asked to lie in a different position if an injection will be made in your neck.  Machines will be used to monitor your oxygen levels, heart rate, and blood pressure.  If an injection  will be made in your neck, an IV tube will be inserted into one of your veins. Fluids and medicine will flow directly into your body through the IV tube.  The area over the facet joint where the injection will be made will be cleaned with soap. The surrounding skin will be covered with clean drapes.  A numbing medicine (local anesthetic) will be applied to your skin. Your skin may sting or burn for a moment.  A video X-ray machine (fluoroscopy) will be used to locate the joint. In some cases, a CT scan may be used.  A contrast dye may be injected into the facet joint area to help locate the joint.  When the joint is located, an anesthetic will be injected into the joint through the needle.  Your health care provider will ask you whether you feel pain relief. If you do feel relief, a steroid may be injected to provide pain relief for a longer period of time. If you do not feel relief or feel only partial relief, additional injections of an anesthetic may be made in other facet joints.  The needle will be removed.  Your skin will be cleaned.  A bandage (dressing) will be applied over each injection site. The procedure may vary among health care providers and hospitals. What happens after the procedure?  You will be observed for 15-30 minutes before being allowed to go home. This information is not intended to replace advice given to you by your health care provider. Make sure you discuss any questions you have with your health care provider. Document Released: 01/18/2007 Document Revised: 09/30/2015 Document Reviewed: 05/25/2015 Elsevier Interactive Patient Education  2018 [place]  Facet Joint Block, Care After Refer to this sheet in the next few weeks. These instructions provide you with information about caring for yourself after your procedure. Your health care provider may also give you more specific instructions. Your treatment has been planned according to current medical  practices, but problems sometimes occur. Call your health care provider if you have any problems or questions after your procedure. What can I expect after the procedure? After the procedure, it is common to have:  Some tenderness over the injection sites for 2 days after the procedure.  A temporary increase in blood sugar if you have diabetes.  Follow these instructions at home:  Keep track of the amount of pain relief you feel and how long it lasts.  Take over-the-counter and prescription medicines only as told by your health care provider. You may need to limit pain medicine within the first 4-6 hours after the procedure.  Remove your bandages (dressings) the morning after the procedure.  For the first 24 hours after the procedure: ? Do not apply heat near or over the injection sites. ? Do not take a bath or soak in water, such as in a pool or lake. ? Do not drive or operate heavy machinery unless approved by your health care provider. ? Avoid activities that require a lot of energy.  If the injection site is tender, try applying ice to the area. To do this: ? Put ice in a plastic bag. ? Place a towel between your skin and the bag. ? Leave  the ice on for 20 minutes, 2-3 times a day.  Keep all follow-up visits as told by your health care provider. This is important. Contact a health care provider if:  Fluid is coming from an injection site.  There is significant bleeding or swelling at an injection site.  You have diabetes and your blood sugar is above 180 mg/dL. Get help right away if:  You have a fever.  You have worsening pain or swelling around an injection site.  There are red streaks around an injection site.  You develop severe pain that is not controlled by your medicines.  You develop a headache, stiff neck, nausea, or vomiting.  Your eyes become very sensitive to light.  You have weakness, paralysis, or tingling in your arms or legs that was not present  before the procedure.  You have difficulty urinating or breathing. This information is not intended to replace advice given to you by your health care provider. Make sure you discuss any questions you have with your health care provider. Document Released: 08/15/2012 Document Revised: 01/13/2016 Document Reviewed: 05/25/2015 Elsevier Interactive Patient Education  2018 [place]  What are the risk, side effects and possible complications? Generally speaking, most procedures are safe.  However, with any procedure there are risks, side effects, and the possibility of complications.  The risks and complications are dependent upon the sites that are lesioned, or the type of nerve block to be performed.  The closer the procedure is to the spine, the more serious the risks are.  Great care is taken when placing the radio frequency needles, block needles or lesioning probes, but sometimes complications can occur. 1. Infection: Any time there is an injection through the skin, there is a risk of infection.  This is why sterile conditions are used for these blocks.  There are four possible types of infection. 1. Localized skin infection. 2. Central Nervous System Infection-This can be in the form of Meningitis, which can be deadly. 3. Epidural Infections-This can be in the form of an epidural abscess, which can cause pressure inside of the spine, causing compression of the spinal cord with subsequent paralysis. This would require an emergency surgery to decompress, and there are no guarantees that the patient would recover from the paralysis. 4. Discitis-This is an infection of the intervertebral discs.  It occurs in about 1% of discography procedures.  It is difficult to treat and it may lead to surgery.        2. Pain: the needles have to go through skin and soft tissues, will cause soreness.       3. Damage to internal structures:  The nerves to be lesioned may be near  blood vessels or    other nerves which can be potentially damaged.       4. Bleeding: Bleeding is more common if the patient is taking blood thinners such as  aspirin, Coumadin, Ticiid, Plavix, etc., or if he/she have some genetic predisposition  such as hemophilia. Bleeding into the spinal canal can cause compression of the spinal  cord with subsequent paralysis.  This would require an emergency surgery to  decompress and there are no guarantees that the patient would recover from the  paralysis.       5. Pneumothorax:  Puncturing of a lung is a possibility, every time a needle is introduced in  the area of the chest or upper back.  Pneumothorax refers to free air around the  collapsed  lung(s), inside of the thoracic cavity (chest cavity).  Another two possible  complications related to a similar event would include: Hemothorax and Chylothorax.   These are variations of the Pneumothorax, where instead of air around the collapsed  lung(s), you may have blood or chyle, respectively.       6. Spinal headaches: They may occur with any procedures in the area of the spine.       7. Persistent CSF (Cerebro-Spinal Fluid) leakage: This is a rare problem, but may occur  with prolonged intrathecal or epidural catheters either due to the formation of a fistulous  track or a dural tear.       8. Nerve damage: By working so close to the spinal cord, there is always a possibility of  nerve damage, which could be as serious as a permanent spinal cord injury with  paralysis.       9. Death:  Although rare, severe deadly allergic reactions known as "Anaphylactic  reaction" can occur to any of the medications used.      10. Worsening of the symptoms:  We can always make thing worse.  What are the chances of something like this happening? Chances of any of this occuring are extremely low.  By statistics, you have more of a chance of getting killed in a motor vehicle accident: while driving to the hospital than any of the  above occurring .  Nevertheless, you should be aware that they are possibilities.  In general, it is similar to taking a shower.  Everybody knows that you can slip, hit your head and get killed.  Does that mean that you should not shower again?  Nevertheless always keep in mind that statistics do not mean anything if you happen to be on the wrong side of them.  Even if a procedure has a 1 (one) in a 1,000,000 (million) chance of going wrong, it you happen to be that one..Also, keep in mind that by statistics, you have more of a chance of having something go wrong when taking medications.  Who should not have this procedure? If you are on a blood thinning medication (e.g. Coumadin, Plavix, see list of "Blood Thinners"), or if you have an active infection going on, you should not have the procedure.  If you are taking any blood thinners, please inform your physician.  How should I prepare for this procedure?  Do not eat or drink anything at least six hours prior to the procedure.  Bring a driver with you .  It cannot be a taxi.  Come accompanied by an adult that can drive you back, and that is strong enough to help you if your legs get weak or numb from the local anesthetic.  Take all of your medicines the morning of the procedure with just enough water to swallow them.  If you have diabetes, make sure that you are scheduled to have your procedure done first thing in the morning, whenever possible.  If you have diabetes, take only half of your insulin dose and notify our nurse that you have done so as soon as you arrive at the clinic.  If you are diabetic, but only take blood sugar pills (oral hypoglycemic), then do not take them on the morning of your procedure.  You may take them after you have had the procedure.  Do not take aspirin or any aspirin-containing medications, at least eleven (11) days prior to the procedure.  They may prolong bleeding.  Wear  loose fitting clothing that may be easy  to take off and that you would not mind if it got stained with Betadine or blood.  Do not wear any jewelry or perfume  Remove any nail coloring.  It will interfere with some of our monitoring equipment.  NOTE: Remember that this is not meant to be interpreted as a complete list of all possible complications.  Unforeseen problems may occur.  BLOOD THINNERS The following drugs contain aspirin or other products, which can cause increased bleeding during surgery and should not be taken for 2 weeks prior to and 1 week after surgery.  If you should need take something for relief of minor pain, you may take acetaminophen which is found in Tylenol,m Datril, Anacin-3 and Panadol. It is not blood thinner. The products listed below are.  Do not take any of the products listed below in addition to any listed on your instruction sheet.  A.P.C or A.P.C with Codeine Codeine Phosphate Capsules #3 Ibuprofen Ridaura  ABC compound Congesprin Imuran rimadil  Advil Cope Indocin Robaxisal  Alka-Seltzer Effervescent Pain Reliever and Antacid Coricidin or Coricidin-D  Indomethacin Rufen  Alka-Seltzer plus Cold Medicine Cosprin Ketoprofen S-A-C Tablets  Anacin Analgesic Tablets or Capsules Coumadin Korlgesic Salflex  Anacin Extra Strength Analgesic tablets or capsules CP-2 Tablets Lanoril Salicylate  Anaprox Cuprimine Capsules Levenox Salocol  Anexsia-D Dalteparin Magan Salsalate  Anodynos Darvon compound Magnesium Salicylate Sine-off  Ansaid Dasin Capsules Magsal Sodium Salicylate  Anturane Depen Capsules Marnal Soma  APF Arthritis pain formula Dewitt's Pills Measurin Stanback  Argesic Dia-Gesic Meclofenamic Sulfinpyrazone  Arthritis Bayer Timed Release Aspirin Diclofenac Meclomen Sulindac  Arthritis pain formula Anacin Dicumarol Medipren Supac  Analgesic (Safety coated) Arthralgen Diffunasal Mefanamic Suprofen  Arthritis Strength Bufferin Dihydrocodeine Mepro Compound Suprol  Arthropan liquid Dopirydamole  Methcarbomol with Aspirin Synalgos  ASA tablets/Enseals Disalcid Micrainin Tagament  Ascriptin Doan's Midol Talwin  Ascriptin A/D Dolene Mobidin Tanderil  Ascriptin Extra Strength Dolobid Moblgesic Ticlid  Ascriptin with Codeine Doloprin or Doloprin with Codeine Momentum Tolectin  Asperbuf Duoprin Mono-gesic Trendar  Aspergum Duradyne Motrin or Motrin IB Triminicin  Aspirin plain, buffered or enteric coated Durasal Myochrisine Trigesic  Aspirin Suppositories Easprin Nalfon Trillsate  Aspirin with Codeine Ecotrin Regular or Extra Strength Naprosyn Uracel  Atromid-S Efficin Naproxen Ursinus  Auranofin Capsules Elmiron Neocylate Vanquish  Axotal Emagrin Norgesic Verin  Azathioprine Empirin or Empirin with Codeine Normiflo Vitamin E  Azolid Emprazil Nuprin Voltaren  Bayer Aspirin plain, buffered or children's or timed BC Tablets or powders Encaprin Orgaran Warfarin Sodium  Buff-a-Comp Enoxaparin Orudis Zorpin  Buff-a-Comp with Codeine Equegesic Os-Cal-Gesic   Buffaprin Excedrin plain, buffered or Extra Strength Oxalid   Bufferin Arthritis Strength Feldene Oxphenbutazone   Bufferin plain or Extra Strength Feldene Capsules Oxycodone with Aspirin   Bufferin with Codeine Fenoprofen Fenoprofen Pabalate or Pabalate-SF   Buffets II Flogesic Panagesic   Buffinol plain or Extra Strength Florinal or Florinal with Codeine Panwarfarin   Buf-Tabs Flurbiprofen Penicillamine   Butalbital Compound Four-way cold tablets Penicillin   Butazolidin Fragmin Pepto-Bismol   Carbenicillin Geminisyn Percodan   Carna Arthritis Reliever Geopen Persantine   Carprofen Gold's salt Persistin   Chloramphenicol Goody's Phenylbutazone   Chloromycetin Haltrain Piroxlcam   Clmetidine heparin Plaquenil   Cllnoril Hyco-pap Ponstel   Clofibrate Hydroxy chloroquine Propoxyphen         Before stopping any of these medications, be sure to consult the physician who ordered them.  Some, such as Coumadin (Warfarin) are ordered  to prevent  or treat serious conditions such as "deep thrombosis", "pumonary embolisms", and other heart problems.  The amount of time that you may need off of the medication may also vary with the medication and the reason for which you were taking it.  If you are taking any of these medications, please make sure you notify your pain physician before you undergo any procedures.         Pain Management Discharge Instructions  General Discharge Instructions :  If you need to reach your doctor call: Monday-Friday 8:00 am - 4:00 pm at [phone] or toll free [phone].  After clinic hours [phone] to have operator reach doctor.  Bring all of your medication bottles to all your appointments in the pain clinic.  To cancel or reschedule your appointment with Pain Management please remember to call 24 hours in advance to avoid a fee.  Refer to the educational materials which you have been given on: General Risks, I had my Procedure. Discharge Instructions, Post Sedation.  Post Procedure Instructions:  The drugs you were given will stay in your system until tomorrow, so for the next 24 hours you should not drive, make any legal decisions or drink any alcoholic beverages.  You may eat anything you prefer, but it is better to start with liquids then soups and crackers, and gradually work up to solid foods.  Please notify your doctor immediately if you have any unusual bleeding, trouble breathing or pain that is not related to your normal pain.  Depending on the type of procedure that was done, some parts of your body may feel week and/or numb.  This usually clears up by tonight or the next day.  Walk with the use of an assistive device or accompanied by an adult for the 24 hours.  You may use ice on the affected area for the first 24 hours.  Put ice in a Ziploc bag and cover with a towel and place against area 15 minutes on 15 minutes off.  You may switch to heat after 24 hours.

## 2017-03-22 NOTE — Patient Instructions (Addendum)
____________________________________________________________________________________________  Post-Procedure instructions Instructions:  Apply ice: Fill a plastic sandwich bag with crushed ice. Cover it with a small towel and apply to injection site. Apply for 15 minutes then remove x 15 minutes. Repeat sequence on day of procedure, until you go to bed. The purpose is to minimize swelling and discomfort after procedure.  Apply heat: Apply heat to procedure site starting the day following the procedure. The purpose is to treat any soreness and discomfort from the procedure.  Food intake: Start with clear liquids (like water) and advance to regular food, as tolerated.   Physical activities: Keep activities to a minimum for the first 8 hours after the procedure.   Driving: If you have received any sedation, you are not allowed to drive for 24 hours after your procedure.  Blood thinner: Restart your blood thinner 6 hours after your procedure. (Only for those taking blood thinners)  Insulin: As soon as you can eat, you may resume your normal dosing schedule. (Only for those taking insulin)  Infection prevention: Keep procedure site clean and dry.  Post-procedure Pain Diary: Extremely important that this be done correctly and accurately. Recorded information will be used to determine the next step in treatment.  Pain evaluated is that of treated area only. Do not include pain from an untreated area.  Complete every hour, on the hour, for the initial 8 hours. Set an alarm to help you do this part accurately.  Do not go to sleep and have it completed later. It will not be accurate.  Follow-up appointment: Keep your follow-up appointment after the procedure. Usually 2 weeks for most procedures. (6 weeks in the case of radiofrequency.) Bring you pain diary.  Expect:  From numbing medicine (AKA: Local Anesthetics): Numbness or decrease in pain.  Onset: Full effect within 15 minutes of  injected.  Duration: It will depend on the type of local anesthetic used. On the average, 1 to 8 hours.   From steroids: Decrease in swelling or inflammation. Once inflammation is improved, relief of the pain will follow.  Onset of benefits: Depends on the amount of swelling present. The more swelling, the longer it will take for the benefits to be seen. In some cases, up to 10 days.  Duration: Steroids will stay in the system x 2 weeks. Duration of benefits will depend on multiple posibilities including persistent irritating factors.  From procedure: Some discomfort is to be expected once the numbing medicine wears off. This should be minimal if ice and heat are applied as instructed. Call if:  You experience numbness and weakness that gets worse with time, as opposed to wearing off.  New onset bowel or bladder incontinence. (Spinal procedures only)  Emergency Numbers:  Durning business hours (Monday - Thursday, 8:00 AM - 4:00 PM) (Friday, 9:00 AM - 12:00 Noon): [phone]  After hours: [phone] ____________________________________________________________________________________________  Pain Management Discharge Instructions  General Discharge Instructions :  If you need to reach your doctor call: Monday-Friday 8:00 am - 4:00 pm at [phone] or toll free [phone].  After clinic hours [phone] to have operator reach doctor.  Bring all of your medication bottles to all your appointments in the pain clinic.  To cancel or reschedule your appointment with Pain Management please remember to call 24 hours in advance to avoid a fee.  Refer to the educational materials which you have been given on: General Risks, I had my Procedure. Discharge Instructions, Post Sedation.  Post Procedure Instructions:  The drugs you   were given will stay in your system until tomorrow, so for the next 24 hours you should not drive, make any legal decisions or drink any alcoholic  beverages.  You may eat anything you prefer, but it is better to start with liquids then soups and crackers, and gradually work up to solid foods.  Please notify your doctor immediately if you have any unusual bleeding, trouble breathing or pain that is not related to your normal pain.  Depending on the type of procedure that was done, some parts of your body may feel week and/or numb.  This usually clears up by tonight or the next day.  Walk with the use of an assistive device or accompanied by an adult for the 24 hours.  You may use ice on the affected area for the first 24 hours.  Put ice in a Ziploc bag and cover with a towel and place against area 15 minutes on 15 minutes off.  You may switch to heat after 24 hours. Facet Joint Block The facet joints connect the bones of the spine (vertebrae). They make it possible for you to bend, twist, and make other movements with your spine. They also keep you from bending too far, twisting too far, and making other excessive movements. A facet joint block is a procedure where a numbing medicine (anesthetic) is injected into a facet joint. Often, a type of anti-inflammatory medicine called a steroid is also injected. A facet joint block may be done to diagnose neck or back pain. If the pain gets better after a facet joint block, it means the pain is probably coming from the facet joint. If the pain does not get better, it means the pain is probably not coming from the facet joint. A facet joint block may also be done to relieve neck or back pain caused by an inflamed facet joint. A facet joint block is only done to relieve pain if the pain does not improve with other methods, such as medicine, exercise programs, and physical therapy. Tell a health care provider about:  Any allergies you have.  All medicines you are taking, including vitamins, herbs, eye drops, creams, and over-the-counter medicines.  Any problems you or family members have had with  anesthetic medicines.  Any blood disorders you have.  Any surgeries you have had.  Any medical conditions you have.  Whether you are pregnant or may be pregnant. What are the risks? Generally, this is a safe procedure. However, problems may occur, including:  Bleeding.  Injury to a nerve near the injection site.  Pain at the injection site.  Weakness or numbness in areas controlled by nerves near the injection site.  Infection.  Temporary fluid retention.  Allergic reactions to medicines or dyes.  Injury to other structures or organs near the injection site.  What happens before the procedure?  Follow instructions from your health care provider about eating or drinking restrictions.  Ask your health care provider about: ? Changing or stopping your regular medicines. This is especially important if you are taking diabetes medicines or blood thinners. ? Taking medicines such as aspirin and ibuprofen. These medicines can thin your blood. Do not take these medicines before your procedure if your health care provider instructs you not to.  Do not take any new dietary supplements or medicines without asking your health care provider first.  Plan to have someone take you home after the procedure. What happens during the procedure?  You may need to remove your   clothing and dress in an open-back gown.  The procedure will be done while you are lying on an X-ray table. You will most likely be asked to lie on your stomach, but you may be asked to lie in a different position if an injection will be made in your neck.  Machines will be used to monitor your oxygen levels, heart rate, and blood pressure.  If an injection will be made in your neck, an IV tube will be inserted into one of your veins. Fluids and medicine will flow directly into your body through the IV tube.  The area over the facet joint where the injection will be made will be cleaned with soap. The surrounding skin  will be covered with clean drapes.  A numbing medicine (local anesthetic) will be applied to your skin. Your skin may sting or burn for a moment.  A video X-ray machine (fluoroscopy) will be used to locate the joint. In some cases, a CT scan may be used.  A contrast dye may be injected into the facet joint area to help locate the joint.  When the joint is located, an anesthetic will be injected into the joint through the needle.  Your health care provider will ask you whether you feel pain relief. If you do feel relief, a steroid may be injected to provide pain relief for a longer period of time. If you do not feel relief or feel only partial relief, additional injections of an anesthetic may be made in other facet joints.  The needle will be removed.  Your skin will be cleaned.  A bandage (dressing) will be applied over each injection site. The procedure may vary among health care providers and hospitals. What happens after the procedure?  You will be observed for 15-30 minutes before being allowed to go home. This information is not intended to replace advice given to you by your health care provider. Make sure you discuss any questions you have with your health care provider. Document Released: 01/18/2007 Document Revised: 09/30/2015 Document Reviewed: 05/25/2015 Elsevier Interactive Patient Education  2018 [employer]  Facet Joint Block, Care After Refer to this sheet in the next few weeks. These instructions provide you with information about caring for yourself after your procedure. Your health care provider may also give you more specific instructions. Your treatment has been planned according to current medical practices, but problems sometimes occur. Call your health care provider if you have any problems or questions after your procedure. What can I expect after the procedure? After the procedure, it is common to have:  Some tenderness over the injection sites for 2 days  after the procedure.  A temporary increase in blood sugar if you have diabetes.  Follow these instructions at home:  Keep track of the amount of pain relief you feel and how long it lasts.  Take over-the-counter and prescription medicines only as told by your health care provider. You may need to limit pain medicine within the first 4-6 hours after the procedure.  Remove your bandages (dressings) the morning after the procedure.  For the first 24 hours after the procedure: ? Do not apply heat near or over the injection sites. ? Do not take a bath or soak in water, such as in a pool or lake. ? Do not drive or operate heavy machinery unless approved by your health care provider. ? Avoid activities that require a lot of energy.  If the injection site is tender, try applying ice   to the area. To do this: ? Put ice in a plastic bag. ? Place a towel between your skin and the bag. ? Leave the ice on for 20 minutes, 2-3 times a day.  Keep all follow-up visits as told by your health care provider. This is important. Contact a health care provider if:  Fluid is coming from an injection site.  There is significant bleeding or swelling at an injection site.  You have diabetes and your blood sugar is above 180 mg/dL. Get help right away if:  You have a fever.  You have worsening pain or swelling around an injection site.  There are red streaks around an injection site.  You develop severe pain that is not controlled by your medicines.  You develop a headache, stiff neck, nausea, or vomiting.  Your eyes become very sensitive to light.  You have weakness, paralysis, or tingling in your arms or legs that was not present before the procedure.  You have difficulty urinating or breathing. This information is not intended to replace advice given to you by your health care provider. Make sure you discuss any questions you have with your health care provider. Document Released: 08/15/2012  Document Revised: 01/13/2016 Document Reviewed: 05/25/2015 Elsevier Interactive Patient Education  2018 [place]  What are the risk, side effects and possible complications? Generally speaking, most procedures are safe.  However, with any procedure there are risks, side effects, and the possibility of complications.  The risks and complications are dependent upon the sites that are lesioned, or the type of nerve block to be performed.  The closer the procedure is to the spine, the more serious the risks are.  Great care is taken when placing the radio frequency needles, block needles or lesioning probes, but sometimes complications can occur. 1. Infection: Any time there is an injection through the skin, there is a risk of infection.  This is why sterile conditions are used for these blocks.  There are four possible types of infection. 1. Localized skin infection. 2. Central Nervous System Infection-This can be in the form of Meningitis, which can be deadly. 3. Epidural Infections-This can be in the form of an epidural abscess, which can cause pressure inside of the spine, causing compression of the spinal cord with subsequent paralysis. This would require an emergency surgery to decompress, and there are no guarantees that the patient would recover from the paralysis. 4. Discitis-This is an infection of the intervertebral discs.  It occurs in about 1% of discography procedures.  It is difficult to treat and it may lead to surgery.        2. Pain: the needles have to go through skin and soft tissues, will cause soreness.       3. Damage to internal structures:  The nerves to be lesioned may be near blood vessels or    other nerves which can be potentially damaged.       4. Bleeding: Bleeding is more common if the patient is taking blood thinners such as  aspirin, Coumadin, Ticiid, Plavix, etc., or if he/she have some genetic predisposition  such as hemophilia.  Bleeding into the spinal canal can cause compression of the spinal  cord with subsequent paralysis.  This would require an emergency surgery to  decompress and there are no guarantees that the patient would recover from the  paralysis.       5. Pneumothorax:  Puncturing of a lung is a possibility, every  time a needle is introduced in  the area of the chest or upper back.  Pneumothorax refers to free air around the  collapsed lung(s), inside of the thoracic cavity (chest cavity).  Another two possible  complications related to a similar event would include: Hemothorax and Chylothorax.   These are variations of the Pneumothorax, where instead of air around the collapsed  lung(s), you may have blood or chyle, respectively.       6. Spinal headaches: They may occur with any procedures in the area of the spine.       7. Persistent CSF (Cerebro-Spinal Fluid) leakage: This is a rare problem, but may occur  with prolonged intrathecal or epidural catheters either due to the formation of a fistulous  track or a dural tear.       8. Nerve damage: By working so close to the spinal cord, there is always a possibility of  nerve damage, which could be as serious as a permanent spinal cord injury with  paralysis.       9. Death:  Although rare, severe deadly allergic reactions known as "Anaphylactic  reaction" can occur to any of the medications used.      10. Worsening of the symptoms:  We can always make thing worse.  What are the chances of something like this happening? Chances of any of this occuring are extremely low.  By statistics, you have more of a chance of getting killed in a motor vehicle accident: while driving to the hospital than any of the above occurring .  Nevertheless, you should be aware that they are possibilities.  In general, it is similar to taking a shower.  Everybody knows that you can slip, hit your head and get killed.  Does that mean that you should not shower again?  Nevertheless always keep  in mind that statistics do not mean anything if you happen to be on the wrong side of them.  Even if a procedure has a 1 (one) in a 1,000,000 (million) chance of going wrong, it you happen to be that one..Also, keep in mind that by statistics, you have more of a chance of having something go wrong when taking medications.  Who should not have this procedure? If you are on a blood thinning medication (e.g. Coumadin, Plavix, see list of "Blood Thinners"), or if you have an active infection going on, you should not have the procedure.  If you are taking any blood thinners, please inform your physician.  How should I prepare for this procedure?  Do not eat or drink anything at least six hours prior to the procedure.  Bring a driver with you .  It cannot be a taxi.  Come accompanied by an adult that can drive you back, and that is strong enough to help you if your legs get weak or numb from the local anesthetic.  Take all of your medicines the morning of the procedure with just enough water to swallow them.  If you have diabetes, make sure that you are scheduled to have your procedure done first thing in the morning, whenever possible.  If you have diabetes, take only half of your insulin dose and notify our nurse that you have done so as soon as you arrive at the clinic.  If you are diabetic, but only take blood sugar pills (oral hypoglycemic), then do not take them on the morning of your procedure.  You may take them after you have had the procedure.  Do not take aspirin or any aspirin-containing medications, at least eleven (11) days prior to the procedure.  They may prolong bleeding.  Wear loose fitting clothing that may be easy to take off and that you would not mind if it got stained with Betadine or blood.  Do not wear any jewelry or perfume  Remove any nail coloring.  It will interfere with some of our monitoring equipment.  NOTE: Remember that this is not meant to be interpreted as a  complete list of all possible complications.  Unforeseen problems may occur.  BLOOD THINNERS The following drugs contain aspirin or other products, which can cause increased bleeding during surgery and should not be taken for 2 weeks prior to and 1 week after surgery.  If you should need take something for relief of minor pain, you may take acetaminophen which is found in Tylenol,m Datril, Anacin-3 and Panadol. It is not blood thinner. The products listed below are.  Do not take any of the products listed below in addition to any listed on your instruction sheet.  A.P.C or A.P.C with Codeine Codeine Phosphate Capsules #3 Ibuprofen Ridaura  ABC compound Congesprin Imuran rimadil  Advil Cope Indocin Robaxisal  Alka-Seltzer Effervescent Pain Reliever and Antacid Coricidin or Coricidin-D  Indomethacin Rufen  Alka-Seltzer plus Cold Medicine Cosprin Ketoprofen S-A-C Tablets  Anacin Analgesic Tablets or Capsules Coumadin Korlgesic Salflex  Anacin Extra Strength Analgesic tablets or capsules CP-2 Tablets Lanoril Salicylate  Anaprox Cuprimine Capsules Levenox Salocol  Anexsia-D Dalteparin Magan Salsalate  Anodynos Darvon compound Magnesium Salicylate Sine-off  Ansaid Dasin Capsules Magsal Sodium Salicylate  Anturane Depen Capsules Marnal Soma  APF Arthritis pain formula Dewitt's Pills Measurin Stanback  Argesic Dia-Gesic Meclofenamic Sulfinpyrazone  Arthritis Bayer Timed Release Aspirin Diclofenac Meclomen Sulindac  Arthritis pain formula Anacin Dicumarol Medipren Supac  Analgesic (Safety coated) Arthralgen Diffunasal Mefanamic Suprofen  Arthritis Strength Bufferin Dihydrocodeine Mepro Compound Suprol  Arthropan liquid Dopirydamole Methcarbomol with Aspirin Synalgos  ASA tablets/Enseals Disalcid Micrainin Tagament  Ascriptin Doan's Midol Talwin  Ascriptin A/D Dolene Mobidin Tanderil  Ascriptin Extra Strength Dolobid Moblgesic Ticlid  Ascriptin with Codeine Doloprin or Doloprin with Codeine  Momentum Tolectin  Asperbuf Duoprin Mono-gesic Trendar  Aspergum Duradyne Motrin or Motrin IB Triminicin  Aspirin plain, buffered or enteric coated Durasal Myochrisine Trigesic  Aspirin Suppositories Easprin Nalfon Trillsate  Aspirin with Codeine Ecotrin Regular or Extra Strength Naprosyn Uracel  Atromid-S Efficin Naproxen Ursinus  Auranofin Capsules Elmiron Neocylate Vanquish  Axotal Emagrin Norgesic Verin  Azathioprine Empirin or Empirin with Codeine Normiflo Vitamin E  Azolid Emprazil Nuprin Voltaren  Bayer Aspirin plain, buffered or children's or timed BC Tablets or powders Encaprin Orgaran Warfarin Sodium  Buff-a-Comp Enoxaparin Orudis Zorpin  Buff-a-Comp with Codeine Equegesic Os-Cal-Gesic   Buffaprin Excedrin plain, buffered or Extra Strength Oxalid   Bufferin Arthritis Strength Feldene Oxphenbutazone   Bufferin plain or Extra Strength Feldene Capsules Oxycodone with Aspirin   Bufferin with Codeine Fenoprofen Fenoprofen Pabalate or Pabalate-SF   Buffets II Flogesic Panagesic   Buffinol plain or Extra Strength Florinal or Florinal with Codeine Panwarfarin   Buf-Tabs Flurbiprofen Penicillamine   Butalbital Compound Four-way cold tablets Penicillin   Butazolidin Fragmin Pepto-Bismol   Carbenicillin Geminisyn Percodan   Carna Arthritis Reliever Geopen Persantine   Carprofen Gold's salt Persistin   Chloramphenicol Goody's Phenylbutazone   Chloromycetin Haltrain Piroxlcam   Clmetidine heparin Plaquenil   Cllnoril Hyco-pap Ponstel   Clofibrate Hydroxy chloroquine Propoxyphen         Before stopping  any of these medications, be sure to consult the physician who ordered them.  Some, such as Coumadin (Warfarin) are ordered to prevent or treat serious conditions such as "deep thrombosis", "pumonary embolisms", and other heart problems.  The amount of time that you may need off of the medication may also vary with the medication and the reason for which you were taking it.  If you are  taking any of these medications, please make sure you notify your pain physician before you undergo any procedures.         Pain Management Discharge Instructions  General Discharge Instructions :  If you need to reach your doctor call: Monday-Friday 8:00 am - 4:00 pm at [phone] or toll free [phone].  After clinic hours [phone] to have operator reach doctor.  Bring all of your medication bottles to all your appointments in the pain clinic.  To cancel or reschedule your appointment with Pain Management please remember to call 24 hours in advance to avoid a fee.  Refer to the educational materials which you have been given on: General Risks, I had my Procedure. Discharge Instructions, Post Sedation.  Post Procedure Instructions:  The drugs you were given will stay in your system until tomorrow, so for the next 24 hours you should not drive, make any legal decisions or drink any alcoholic beverages.  You may eat anything you prefer, but it is better to start with liquids then soups and crackers, and gradually work up to solid foods.  Please notify your doctor immediately if you have any unusual bleeding, trouble breathing or pain that is not related to your normal pain.  Depending on the type of procedure that was done, some parts of your body may feel week and/or numb.  This usually clears up by tonight or the next day.  Walk with the use of an assistive device or accompanied by an adult for the 24 hours.  You may use ice on the affected area for the first 24 hours.  Put ice in a Ziploc bag and cover with a towel and place against area 15 minutes on 15 minutes off.  You may switch to heat after 24 hours.

## 2017-04-11 DIAGNOSIS — G8929 Other chronic pain: Secondary | ICD-10-CM | POA: Insufficient documentation

## 2017-04-11 DIAGNOSIS — M79602 Pain in left arm: Secondary | ICD-10-CM

## 2017-04-11 DIAGNOSIS — M79601 Pain in right arm: Secondary | ICD-10-CM

## 2017-04-11 NOTE — Progress Notes (Signed)
Patient's Name: Zoe Sanchez  MRN: [id number]  Referring Provider: Denton Lank, MD  DOB: 1956-08-22  PCP: Zoe Lank, MD  DOS: 04/12/2017  Note by: Zoe Cola, MD  Service setting: Ambulatory outpatient  Specialty: Interventional Pain Management  Location: [facility] (AMB) Pain Management Facility    Patient type: Established   Primary Reason(s) for Visit: Encounter for post-procedure evaluation of chronic illness with mild to moderate exacerbation CC: Back Pain (low) and Neck Pain  HPI  [name] is a 61 y.o. year old, female patient, who comes today for a post-procedure evaluation. She has Microcalcifications of the breast; Encounter for therapeutic drug level monitoring; Long term current use of opiate analgesic; Opiate use (30 MME/Day); Magnesium deficiency; Vitamin B12 deficiency; Myofascial pain syndrome; Cervical facet syndrome (Location of Primary Source of Pain) (Bilateral) (R>L); Chronic neck pain (Location of Primary Source of Pain) (Bilateral) (R>L); Chronic pain syndrome; Cervical spondylosis; Anxiety; Paroxysmal supraventricular tachycardia (Zoe Sanchez); Syncope and collapse; Fibromyalgia; Cervical herniated disc (C5-6 and C6-7); Cervical foraminal stenosis (Bilateral) (C5-6); Chronic cervical radicular pain (Location of Secondary source of pain) (Bilateral) (R>L) (C8 Dermatome); Chronic low back pain (Location of Tertiary source of pain) (Bilateral) (R>L); Lumbar spondylosis; Lower extremity pain (Bilateral) (L>R); Coronary atherosclerosis of native coronary artery; Generalized anxiety disorder; Intermittent urinary incontinence; History of psychiatric disorder; Depression; Family history of chronic pain; History of attempted suicide; History of suicidal ideation; Irritable bowel syndrome; Intermittent diarrhea; Chronic constipation; Insomnia secondary to chronic pain; History of hiatal hernia; Long term prescription opiate use; Vitamin D insufficiency; Opioid-induced constipation (OIC);  Carpal tunnel syndrome (Left); Claustrophobia; Chronic shoulder pain (Bilateral); Cervical facet arthropathy; Lumbar facet arthropathy (Zoe Sanchez); Bimalleolar fracture; Lumbar facet syndrome (Bilateral) (R>L); and Chronic upper extremity pain (Location of Secondary source of pain) (Bilateral) (R>L) on her problem list. Her primarily concern today is the Back Pain (low) and Neck Pain  Pain Assessment: Location: Lower Back Radiating: radiates to bilateral hips and occasionally radiates down back of right leg to knee.  Onset: More than a month ago Duration: Chronic pain Quality: Constant, Sharp, Aching, Discomfort Severity: 2 /10 (self-reported pain score)  Note: Reported level is compatible with observation.                   Timing: Constant Modifying factors: restinig  [name] comes in today for post-procedure evaluation after the treatment done on 03/22/2017. NO to request to increase opioids. Drug Holiday discussed. Patient is afraid of drug holiday. Agrees to plan to do 2nd diagnostic lumbar facet block, and possible RFA.  Further details on both, my assessment(s), as well as the proposed treatment plan, please see below.  Post-Procedure Assessment  03/22/2017 Procedure: Diagnostic bilateral lumbar facet block under fluoroscopic guidance and IV sedation (no steroids) Pre-procedure pain score:  3/10 Post-procedure pain score: 0/10 (100% relief) Influential Factors: BMI: 27.25 kg/m Intra-procedural challenges: None observed.         Assessment challenges: None detected.              Reported side-effects: None.        Post-procedural adverse reactions or complications: None reported         Sedation: Sedation provided. When no sedatives are used, the analgesic levels obtained are directly associated to the effectiveness of the local anesthetics. However, when sedation is provided, the level of analgesia obtained during the initial 1 hour following the intervention, is believed to be the  result of a combination of factors. These factors may include, but  are not limited to: 1. The effectiveness of the local anesthetics used. 2. The effects of the analgesic(s) and/or anxiolytic(s) used. 3. The degree of discomfort experienced by the patient at the time of the procedure. 4. The patients ability and reliability in recalling and recording the events. 5. The presence and influence of possible secondary gains and/or psychosocial factors. Reported result: Relief experienced during the 1st hour after the procedure: 100 % (Ultra-Short Term Relief) Interpretative annotation: Clinically appropriate result. Analgesia during this period is likely to be Local Anesthetic and/or IV Sedative (Analgesic/Anxiolytic) related.          Effects of local anesthetic: The analgesic effects attained during this period are directly associated to the localized infiltration of local anesthetics and therefore cary significant diagnostic value as to the etiological location, or anatomical origin, of the pain. Expected duration of relief is directly dependent on the pharmacodynamics of the local anesthetic used. Long-acting (4-6 hours) anesthetics used.  Reported result: Relief during the next 4 to 6 hour after the procedure: 100 % (Short-Term Relief) Interpretative annotation: Clinically appropriate result. Analgesia during this period is likely to be Local Anesthetic-related.          Long-term benefit: Defined as the period of time past the expected duration of local anesthetics (1 hour for short-acting and 4-6 hours for long-acting). With the possible exception of prolonged sympathetic blockade from the local anesthetics, benefits during this period are typically attributed to, or associated with, other factors such as analgesic sensory neuropraxia, antiinflammatory effects, or beneficial biochemical changes provided by agents other than the local anesthetics.  Reported result: Extended relief following procedure:  50 % (x 1.5 days) (Long-Term Relief) Interpretative annotation: Clinically possible results. Partial relief. No long-term benefit expected. [name] requested to have NO STEROIDS injected.          Current benefits: Defined as persistent relief that continues at this point in time.   Reported results: Treated area: 0 %       Interpretative annotation: Recurrence of symptoms. No long-term benefit expected. Effective diagnostic intervention.          Interpretation: Results would suggest a successful diagnostic intervention. The patient has failed to respond to conservative therapies including over-the-counter medications, anti-inflammatories, muscle relaxants, membrane stabilizers, opioids, physical therapy, modalities such as heat and ice, as well as more invasive techniques such as nerve blocks. Because Ms. Aldea did attain more than 50% relief of the pain during a series of diagnostic blocks conducted in separate occasions, I believe it is medically necessary to proceed with Radiofrequency Ablation, in order to attempt gaining longer relief.  Laboratory Chemistry  Inflammation Markers (CRP: Acute Phase) (ESR: Chronic Phase) Lab Results  Component Value Date   ESRSEDRATE 26 09/28/2015                 Renal Function Markers Lab Results  Component Value Date   BUN 11 09/28/2015   CREATININE 0.70 09/28/2015   GFRAA >60 09/28/2015   GFRNONAA >60 09/28/2015                 Hepatic Function Markers Lab Results  Component Value Date   AST 19 09/28/2015   ALT 22 09/28/2015   ALBUMIN 3.5 09/28/2015   ALKPHOS 100 09/28/2015                 Electrolytes Lab Results  Component Value Date   NA 137 09/28/2015   K 4.2 09/28/2015   CL 106 09/28/2015  CALCIUM 8.9 09/28/2015   MG 1.8 03/09/2016                 Neuropathy Markers Lab Results  Component Value Date   ELFYBOFB51 025 (H) 03/09/2016                 Bone Pathology Markers Lab Results  Component Value Date    ALKPHOS 100 09/28/2015   25OHVITD1 49 03/09/2016   25OHVITD2 <1.0 03/09/2016   25OHVITD3 48 03/09/2016   CALCIUM 8.9 09/28/2015                 Coagulation Parameters Lab Results  Component Value Date   INR 0.9 03/06/2012   LABPROT 12.8 03/06/2012   PLT 415 11/09/2012                 Cardiovascular Markers Lab Results  Component Value Date   HGB 12.3 11/09/2012   HCT 36.1 11/09/2012                 Note: Lab results reviewed.  Recent Diagnostic Imaging Review  Dg C-arm 1-60 Min-no Report  Result Date: 03/22/2017 Fluoroscopy was utilized by the requesting physician.  No radiographic interpretation.   Note: Imaging results reviewed.          Meds   Current Meds  Medication Sig  . amitriptyline (ELAVIL) 100 MG tablet 200 mg at bedtime.   . Cholecalciferol (VITAMIN D3) 2000 units capsule Take 1 capsule (2,000 Units total) by mouth daily.  . cyanocobalamin ([employer] VITAMIN B12) 2000 MCG tablet Take 1 tablet (2,000 mcg total) by mouth daily.  . cyclobenzaprine (FLEXERIL) 5 MG tablet Take 1 tablet (5 mg total) by mouth 4 (four) times daily.  Marland Kitchen dicyclomine (BENTYL) 20 MG tablet Take 20 mg by mouth as needed.   Marland Kitchen estradiol (ESTRACE) 1 MG tablet Take 1 mg by mouth daily.   . fluticasone (FLONASE) 50 MCG/ACT nasal spray   . gemfibrozil (LOPID) 600 MG tablet Take by mouth 2 (two) times daily before a meal.   . HYDROcodone-acetaminophen (NORCO/VICODIN) 5-325 MG tablet Take 1 tablet by mouth every 4 (four) hours as needed for severe pain.  Derrill Memo ON 04/23/2017] HYDROcodone-acetaminophen (NORCO/VICODIN) 5-325 MG tablet Take 1 tablet by mouth every 4 (four) hours as needed for severe pain.  Derrill Memo ON 05/23/2017] HYDROcodone-acetaminophen (NORCO/VICODIN) 5-325 MG tablet Take 1 tablet by mouth every 4 (four) hours as needed for severe pain.  . INCRUSE ELLIPTA 62.5 MCG/INH AEPB   . montelukast (SINGULAIR) 10 MG tablet Take by mouth at bedtime.   Marland Kitchen omeprazole (PRILOSEC) 20 MG capsule Take  20 mg by mouth 2 (two) times daily before a meal.   . pregabalin (LYRICA) 100 MG capsule Take 1 capsule (100 mg total) by mouth at bedtime.  . pregabalin (LYRICA) 25 MG capsule Take 1 capsule (25 mg total) by mouth at bedtime.  . propranolol (INDERAL) 40 MG tablet Take 40 mg by mouth 6 (six) times daily. Takes 6 tabs five times per day.  . propranolol ER (INDERAL LA) 80 MG 24 hr capsule TAKE 2 CAPSULES BY MOUTH ONCE DAILY  . salmeterol (SEREVENT DISKUS) 50 MCG/DOSE diskus inhaler Inhale 1 puff into the lungs 2 (two) times daily.   Marland Kitchen zolpidem (AMBIEN) 10 MG tablet     ROS  Constitutional: Denies any fever or chills Gastrointestinal: No reported hemesis, hematochezia, vomiting, or acute GI distress Musculoskeletal: Denies any acute onset joint swelling, redness, loss of ROM, or  weakness Neurological: No reported episodes of acute onset apraxia, aphasia, dysarthria, agnosia, amnesia, paralysis, loss of coordination, or loss of consciousness  Allergies  Ms. Serpas is allergic to prednisone and other.  PFSH  Drug: Ms. Rieves  reports that she does not use drugs. Alcohol:  reports that she does not drink alcohol. Tobacco:  reports that she has never smoked. She has never used smokeless tobacco. Medical:  has a past medical history of Abnormal heart rhythm; Abnormal mammogram (02/25/2014); Anxiety; Asthma; Awareness of heartbeats (06/29/2015); Breath shortness (06/29/2015); CAD (coronary artery disease); Cancer ([place]); Cervical radicular pain (Location of Secondary source of pain) (Bilateral) (R>L) (C8 Dermatome) (06/29/2015); Chronic pain; COPD (chronic obstructive pulmonary disease) ([place]); Degenerative disk disease; Depression; Endometriosis; Family history of chronic pain (06/29/2015); Fibromyalgia; Fibromyalgia; GERD (gastroesophageal reflux disease); Hiatal hernia; History of attempted suicide (06/29/2015); History of hiatal hernia (06/29/2015); History of psychiatric disorder (06/29/2015);  History of suicidal ideation (06/29/2015); Hyperlipidemia; Insomnia; Insomnia; and Irritable bowel syndrome. Surgical: Ms. Pitones  has a past surgical history that includes Abdominal hysterectomy (1986); Tonsillectomy (1957); Skin lesion excision; Breast surgery; and Breast biopsy (Right, 2016). Family: family history includes Breast cancer in her maternal aunt; Heart disease in her father; Hypertension in her father and mother.  Constitutional Exam  General appearance: Well nourished, well developed, and well hydrated. In no apparent acute distress Vitals:   04/12/17 0903  BP: 116/67  Pulse: 70  Resp: 16  Temp: 97.8 F (36.6 C)  SpO2: 100%  Weight: 149 lb (67.6 kg)  Height: '5\' 2"'  (1.575 m)   BMI Assessment: Estimated body mass index is 27.25 kg/m as calculated from the following:   Height as of this encounter: '5\' 2"'  (1.575 m).   Weight as of this encounter: 149 lb (67.6 kg).  BMI interpretation table: BMI level Category Range association with higher incidence of chronic pain  <18 kg/m2 Underweight   18.5-24.9 kg/m2 Ideal body weight   25-29.9 kg/m2 Overweight Increased incidence by 20%  30-34.9 kg/m2 Obese (Class I) Increased incidence by 68%  35-39.9 kg/m2 Severe obesity (Class II) Increased incidence by 136%  >40 kg/m2 Extreme obesity (Class III) Increased incidence by 254%   BMI Readings from Last 4 Encounters:  04/12/17 27.25 kg/m  03/22/17 28.26 kg/m  03/02/17 28.90 kg/m  01/12/17 27.46 kg/m   Wt Readings from Last 4 Encounters:  04/12/17 149 lb (67.6 kg)  03/22/17 157 lb (71.2 kg)  03/02/17 158 lb (71.7 kg)  01/12/17 155 lb (70.3 kg)  Psych/Mental status: Alert, oriented x 3 (person, place, & time)       Eyes: PERLA Respiratory: No evidence of acute respiratory distress  Cervical Spine Exam  Inspection: No masses, redness, or swelling Alignment: Symmetrical Functional ROM: Unrestricted ROM      Stability: No instability detected Muscle strength & Tone:  Functionally intact Sensory: Unimpaired Palpation: No palpable anomalies              Upper Extremity (UE) Exam    Side: Right upper extremity  Side: Left upper extremity  Inspection: No masses, redness, swelling, or asymmetry. No contractures  Inspection: No masses, redness, swelling, or asymmetry. No contractures  Functional ROM: Unrestricted ROM          Functional ROM: Unrestricted ROM          Muscle strength & Tone: Functionally intact  Muscle strength & Tone: Functionally intact  Sensory: Unimpaired  Sensory: Unimpaired  Palpation: No palpable anomalies  Palpation: No palpable anomalies              Specialized Test(s): Deferred         Specialized Test(s): Deferred          Thoracic Spine Exam  Inspection: No masses, redness, or swelling Alignment: Symmetrical Functional ROM: Unrestricted ROM Stability: No instability detected Sensory: Unimpaired Muscle strength & Tone: No palpable anomalies  Lumbar Spine Exam  Inspection: No masses, redness, or swelling Alignment: Symmetrical Functional ROM: Restricted ROM      Stability: No instability detected Muscle strength & Tone: Functionally intact Sensory: Movement-associated pain Palpation: Complains of area being tender to palpation       Provocative Tests: Lumbar Hyperextension and rotation test: Positive bilaterally for facet joint pain. Lumbar Lateral bending test: evaluation deferred today       Patrick's Maneuver: evaluation deferred today                    Gait & Posture Assessment  Ambulation: Unassisted Gait: Relatively normal for age and body habitus Posture: WNL   Lower Extremity Exam    Side: Right lower extremity  Side: Left lower extremity  Inspection: No masses, redness, swelling, or asymmetry. No contractures  Inspection: No masses, redness, swelling, or asymmetry. No contractures  Functional ROM: Unrestricted ROM          Functional ROM: Unrestricted ROM          Muscle strength & Tone:  Functionally intact  Muscle strength & Tone: Functionally intact  Sensory: Unimpaired  Sensory: Unimpaired  Palpation: No palpable anomalies  Palpation: No palpable anomalies   Assessment  Primary Diagnosis & Pertinent Problem List: The primary encounter diagnosis was Chronic low back pain (Location of Tertiary source of pain) (Bilateral) (R>L). Diagnoses of Lumbar facet syndrome (Bilateral) (R>L), Chronic neck pain (Location of Primary Source of Pain) (Bilateral) (R>L), Cervical facet syndrome (Location of Primary Source of Pain) (Bilateral) (R>L), Chronic cervical radicular pain (Location of Secondary source of pain) (Bilateral) (R>L) (C8 Dermatome), Chronic pain of both upper extremities, and Lumbar spondylosis were also pertinent to this visit.  Status Diagnosis  Worsening Persistent Controlled 1. Chronic low back pain (Location of Tertiary source of pain) (Bilateral) (R>L)   2. Lumbar facet syndrome (Bilateral) (R>L)   3. Chronic neck pain (Location of Primary Source of Pain) (Bilateral) (R>L)   4. Cervical facet syndrome (Location of Primary Source of Pain) (Bilateral) (R>L)   5. Chronic cervical radicular pain (Location of Secondary source of pain) (Bilateral) (R>L) (C8 Dermatome)   6. Chronic pain of both upper extremities   7. Lumbar spondylosis     Problems updated and reviewed during this visit: Problem  Lumbar Facet Arthropathy ([facility])  Bimalleolar Fracture  Family History of Chronic Pain  Paroxysmal Supraventricular Tachycardia ([facility])  Syncope and Collapse   Overview:  NEAR SYNCOPE   Coronary Atherosclerosis of Native Coronary Artery  Depression  Irritable Bowel Syndrome  Intermittent Diarrhea  History of Hiatal Hernia  Microcalcifications of The Breast  Awareness of Heartbeats (Resolved)  Breath Shortness (Resolved)  Abnormal Mammogram (Resolved)   Plan of Care  Pharmacotherapy (Medications Ordered): No orders of the defined types were placed in this  encounter.  New Prescriptions   No medications on file   Medications administered today: Ms. Segovia had no medications administered during this visit.  Lab-work, procedure(s), and/or referral(s): Orders Placed This Encounter  Procedures  . LUMBAR FACET(MEDIAL BRANCH NERVE BLOCK) MBNB    Interventional management  options: Planned, scheduled, and/or pending:   Diagnostic bilateral lumbar facetblock (NO STEROIDS) #2.   Considering:   Diagnostic bilateral cervical facetblock  Possible bilateral cervical facet radiofrequencyablation  Diagnostic right-sided cervical epiduralsteroid injection  Diagnostic bilateral intra-articular shoulder jointinjection  Diagnostic bilateral suprascapular nerveblock  Possible bilateral suprascapular nerve radiofrequencyablation  Diagnostic bilateral lumbar facetblock  Possible bilateral lumbar facet radiofrequencyablation  Diagnostic left sided carpal tunnel injection   Palliative PRN treatment(s):   None at this time    Provider-requested follow-up: Return for Procedure (with sedation): (B) L-FCT BLK (NO STEROIDS).  Future Appointments Date Time Provider [place]  04/20/2017 11:45 AM Milinda Pointer, MD [facility] None  06/07/2017 1:30 PM Milinda Pointer, MD [facility] None   Primary Care Physician: Zoe Lank, MD Location: [facility] Note by: Zoe Cola, MD Date: 04/12/2017; Time: 10:05 AM  Patient Instructions  ____________________________________________________________________________________________  Preparing for Procedure with Sedation Instructions: . Oral Intake: Do not eat or drink anything for at least 8 hours prior to your procedure. . Transportation: Public transportation is not allowed. Bring an adult driver. The driver must be physically present in our waiting room before any procedure can be started. Marland Kitchen Physical Assistance: Bring an adult physically capable of  assisting you, in the event you need help. This adult should keep you company at home for at least 6 hours after the procedure. . Blood Pressure Medicine: Take your blood pressure medicine with a sip of water the morning of the procedure. . Blood thinners:  . Diabetics on insulin: Notify the staff so that you can be scheduled 1st case in the morning. If your diabetes requires high dose insulin, take only  of your normal insulin dose the morning of the procedure and notify the staff that you have done so. . Preventing infections: Shower with an antibacterial soap the morning of your procedure. . Build-up your immune system: Take 1000 mg of Vitamin C with every meal (3 times a day) the day prior to your procedure. Marland Kitchen Antibiotics: Inform the staff if you have a condition or reason that requires you to take antibiotics before dental procedures. . Pregnancy: If you are pregnant, call and cancel the procedure. . Sickness: If you have a cold, fever, or any active infections, call and cancel the procedure. . Arrival: You must be in the facility at least 30 minutes prior to your scheduled procedure. . Children: Do not bring children with you. . Dress appropriately: Bring dark clothing that you would not mind if they get stained. . Valuables: Do not bring any jewelry or valuables. Procedure appointments are reserved for interventional treatments only. Marland Kitchen No Prescription Refills. . No medication changes will be discussed during procedure appointments. . No disability issues will be discussed. ____________________________________________________________________________________________  Preparing for Procedure with Sedation Instructions: . Oral Intake: Do not eat or drink anything for at least 8 hours prior to your procedure. . Transportation: Public transportation is not allowed. Bring an adult driver. The driver must be physically present in our waiting room before any procedure can be started. Marland Kitchen Physical  Assistance: Bring an adult capable of physically assisting you, in the event you need help. . Blood Pressure Medicine: Take your blood pressure medicine with a sip of water the morning of the procedure. . Insulin: Take only  of your normal insulin dose. . Preventing infections: Shower with an antibacterial soap the morning of your procedure. . Build-up your immune system: Take 1000 mg of Vitamin C with every meal (3  times a day) the day prior to your procedure. . Pregnancy: If you are pregnant, call and cancel the procedure. . Sickness: If you have a cold, fever, or any active infections, call and cancel the procedure. . Arrival: You must be in the facility at least 30 minutes prior to your scheduled procedure. . Children: Do not bring children with you. . Dress appropriately: Bring dark clothing that you would not mind if they get stained. . Valuables: Do not bring any jewelry or valuables. Procedure appointments are reserved for interventional treatments only. Marland Kitchen No Prescription Refills. . No medication changes will be discussed during procedure appointments. No disability issues will be discussed.Facet Blocks Patient Information  Description: The facets are joints in the spine between the vertebrae.  Like any joints in the body, facets can become irritated and painful.  Arthritis can also effect the facets.  By injecting steroids and local anesthetic in and around these joints, we can temporarily block the nerve supply to them.  Steroids act directly on irritated nerves and tissues to reduce selling and inflammation which often leads to decreased pain.  Facet blocks may be done anywhere along the spine from the neck to the low back depending upon the location of your pain.   After numbing the skin with local anesthetic (like Novocaine), a small needle is passed onto the facet joints under x-ray guidance.  You may experience a sensation of pressure while this is being done.  The entire block  usually lasts about 15-25 minutes.   Conditions which may be treated by facet blocks:   Low back/buttock pain  Neck/shoulder pain  Certain types of headaches  Preparation for the injection:  1. Do not eat any solid food or dairy products within 8 hours of your appointment. 2. You may drink clear liquid up to 3 hours before appointment.  Clear liquids include water, black coffee, juice or soda.  No milk or cream please. 3. You may take your regular medication, including pain medications, with a sip of water before your appointment.  Diabetics should hold regular insulin (if taken separately) and take 1/2 normal NPH dose the morning of the procedure.  Carry some sugar containing items with you to your appointment. 4. A driver must accompany you and be prepared to drive you home after your procedure. 5. Bring all your current medications with you. 6. An IV may be inserted and sedation may be given at the discretion of the physician. 7. A blood pressure cuff, EKG and other monitors will often be applied during the procedure.  Some patients may need to have extra oxygen administered for a short period. 8. You will be asked to provide medical information, including your allergies and medications, prior to the procedure.  We must know immediately if you are taking blood thinners (like Coumadin/Warfarin) or if you are allergic to IV iodine contrast (dye).  We must know if you could possible be pregnant.  Possible side-effects:   Bleeding from needle site  Infection (rare, may require surgery)  Nerve injury (rare)  Numbness & tingling (temporary)  Difficulty urinating (rare, temporary)  Spinal headache (a headache worse with upright posture)  Light-headedness (temporary)  Pain at injection site (serveral days)  Decreased blood pressure (rare, temporary)  Weakness in arm/leg (temporary)  Pressure sensation in back/neck (temporary)   Call if you experience:   Fever/chills  associated with headache or increased back/neck pain  Headache worsened by an upright position  New onset, weakness or numbness of  an extremity below the injection site  Hives or difficulty breathing (go to the emergency room)  Inflammation or drainage at the injection site(s)  Severe back/neck pain greater than usual  New symptoms which are concerning to you  Please note:  Although the local anesthetic injected can often make your back or neck feel good for several hours after the injection, the pain will likely return. It takes 3-7 days for steroids to work.  You may not notice any pain relief for at least one week.  If effective, we will often do a series of 2-3 injections spaced 3-6 weeks apart to maximally decrease your pain.  After the initial series, you may be a candidate for a more permanent nerve block of the facets.  If you have any questions, please call #[phone]) [facility]

## 2017-04-12 ENCOUNTER — Ambulatory Visit: Payer: Medicare Other | Attending: Pain Medicine | Admitting: Pain Medicine

## 2017-04-12 ENCOUNTER — Encounter: Payer: Self-pay | Admitting: Pain Medicine

## 2017-04-12 VITALS — BP 116/67 | HR 70 | Temp 97.8°F | Resp 16 | Ht 62.0 in | Wt 149.0 lb

## 2017-04-12 DIAGNOSIS — E785 Hyperlipidemia, unspecified: Secondary | ICD-10-CM | POA: Insufficient documentation

## 2017-04-12 DIAGNOSIS — Z888 Allergy status to other drugs, medicaments and biological substances status: Secondary | ICD-10-CM | POA: Diagnosis not present

## 2017-04-12 DIAGNOSIS — K5909 Other constipation: Secondary | ICD-10-CM | POA: Insufficient documentation

## 2017-04-12 DIAGNOSIS — F411 Generalized anxiety disorder: Secondary | ICD-10-CM | POA: Insufficient documentation

## 2017-04-12 DIAGNOSIS — G4700 Insomnia, unspecified: Secondary | ICD-10-CM | POA: Diagnosis not present

## 2017-04-12 DIAGNOSIS — M79602 Pain in left arm: Secondary | ICD-10-CM | POA: Diagnosis not present

## 2017-04-12 DIAGNOSIS — M50122 Cervical disc disorder at C5-C6 level with radiculopathy: Secondary | ICD-10-CM | POA: Insufficient documentation

## 2017-04-12 DIAGNOSIS — Z915 Personal history of self-harm: Secondary | ICD-10-CM | POA: Insufficient documentation

## 2017-04-12 DIAGNOSIS — J449 Chronic obstructive pulmonary disease, unspecified: Secondary | ICD-10-CM | POA: Insufficient documentation

## 2017-04-12 DIAGNOSIS — R55 Syncope and collapse: Secondary | ICD-10-CM | POA: Diagnosis not present

## 2017-04-12 DIAGNOSIS — M4802 Spinal stenosis, cervical region: Secondary | ICD-10-CM | POA: Insufficient documentation

## 2017-04-12 DIAGNOSIS — Z8249 Family history of ischemic heart disease and other diseases of the circulatory system: Secondary | ICD-10-CM | POA: Insufficient documentation

## 2017-04-12 DIAGNOSIS — M4696 Unspecified inflammatory spondylopathy, lumbar region: Secondary | ICD-10-CM

## 2017-04-12 DIAGNOSIS — M5412 Radiculopathy, cervical region: Secondary | ICD-10-CM | POA: Diagnosis not present

## 2017-04-12 DIAGNOSIS — F329 Major depressive disorder, single episode, unspecified: Secondary | ICD-10-CM | POA: Insufficient documentation

## 2017-04-12 DIAGNOSIS — M797 Fibromyalgia: Secondary | ICD-10-CM | POA: Diagnosis not present

## 2017-04-12 DIAGNOSIS — I471 Supraventricular tachycardia: Secondary | ICD-10-CM | POA: Diagnosis not present

## 2017-04-12 DIAGNOSIS — M4692 Unspecified inflammatory spondylopathy, cervical region: Secondary | ICD-10-CM | POA: Diagnosis not present

## 2017-04-12 DIAGNOSIS — E559 Vitamin D deficiency, unspecified: Secondary | ICD-10-CM | POA: Diagnosis not present

## 2017-04-12 DIAGNOSIS — M47816 Spondylosis without myelopathy or radiculopathy, lumbar region: Secondary | ICD-10-CM | POA: Insufficient documentation

## 2017-04-12 DIAGNOSIS — M488X2 Other specified spondylopathies, cervical region: Secondary | ICD-10-CM | POA: Insufficient documentation

## 2017-04-12 DIAGNOSIS — I2510 Atherosclerotic heart disease of native coronary artery without angina pectoris: Secondary | ICD-10-CM | POA: Insufficient documentation

## 2017-04-12 DIAGNOSIS — M5441 Lumbago with sciatica, right side: Secondary | ICD-10-CM

## 2017-04-12 DIAGNOSIS — M488X6 Other specified spondylopathies, lumbar region: Secondary | ICD-10-CM | POA: Diagnosis not present

## 2017-04-12 DIAGNOSIS — Z79891 Long term (current) use of opiate analgesic: Secondary | ICD-10-CM | POA: Insufficient documentation

## 2017-04-12 DIAGNOSIS — M545 Low back pain: Secondary | ICD-10-CM | POA: Insufficient documentation

## 2017-04-12 DIAGNOSIS — G8929 Other chronic pain: Secondary | ICD-10-CM

## 2017-04-12 DIAGNOSIS — K580 Irritable bowel syndrome with diarrhea: Secondary | ICD-10-CM | POA: Insufficient documentation

## 2017-04-12 DIAGNOSIS — E538 Deficiency of other specified B group vitamins: Secondary | ICD-10-CM | POA: Insufficient documentation

## 2017-04-12 DIAGNOSIS — Z79899 Other long term (current) drug therapy: Secondary | ICD-10-CM | POA: Diagnosis not present

## 2017-04-12 DIAGNOSIS — M5442 Lumbago with sciatica, left side: Secondary | ICD-10-CM | POA: Diagnosis not present

## 2017-04-12 DIAGNOSIS — Z5181 Encounter for therapeutic drug level monitoring: Secondary | ICD-10-CM | POA: Insufficient documentation

## 2017-04-12 DIAGNOSIS — G894 Chronic pain syndrome: Secondary | ICD-10-CM | POA: Insufficient documentation

## 2017-04-12 DIAGNOSIS — G5602 Carpal tunnel syndrome, left upper limb: Secondary | ICD-10-CM | POA: Insufficient documentation

## 2017-04-12 DIAGNOSIS — Z7951 Long term (current) use of inhaled steroids: Secondary | ICD-10-CM | POA: Diagnosis not present

## 2017-04-12 DIAGNOSIS — Z8742 Personal history of other diseases of the female genital tract: Secondary | ICD-10-CM | POA: Insufficient documentation

## 2017-04-12 DIAGNOSIS — K219 Gastro-esophageal reflux disease without esophagitis: Secondary | ICD-10-CM | POA: Diagnosis not present

## 2017-04-12 DIAGNOSIS — M4722 Other spondylosis with radiculopathy, cervical region: Secondary | ICD-10-CM | POA: Diagnosis not present

## 2017-04-12 DIAGNOSIS — M47812 Spondylosis without myelopathy or radiculopathy, cervical region: Secondary | ICD-10-CM

## 2017-04-12 DIAGNOSIS — M542 Cervicalgia: Secondary | ICD-10-CM | POA: Diagnosis not present

## 2017-04-12 DIAGNOSIS — M79601 Pain in right arm: Secondary | ICD-10-CM | POA: Diagnosis not present

## 2017-04-12 NOTE — Progress Notes (Signed)
Safety precautions to be maintained throughout the outpatient stay will include: orient to surroundings, keep bed in low position, maintain call bell within reach at all times, provide assistance with transfer out of bed and ambulation.  

## 2017-04-12 NOTE — Patient Instructions (Addendum)
____________________________________________________________________________________________  Preparing for Procedure with Sedation Instructions: . Oral Intake: Do not eat or drink anything for at least 8 hours prior to your procedure. . Transportation: Public transportation is not allowed. Bring an adult driver. The driver must be physically present in our waiting room before any procedure can be started. [facility] Physical Assistance: Bring an adult physically capable of assisting you, in the event you need help. This adult should keep you company at home for at least 6 hours after the procedure. . Blood Pressure Medicine: Take your blood pressure medicine with a sip of water the morning of the procedure. . Blood thinners:  . Diabetics on insulin: Notify the staff so that you can be scheduled 1st case in the morning. If your diabetes requires high dose insulin, take only  of your normal insulin dose the morning of the procedure and notify the staff that you have done so. . Preventing infections: Shower with an antibacterial soap the morning of your procedure. . Build-up your immune system: Take 1000 mg of Vitamin C with every meal (3 times a day) the day prior to your procedure. [facility] Kitchen Antibiotics: Inform the staff if you have a condition or reason that requires you to take antibiotics before dental procedures. . Pregnancy: If you are pregnant, call and cancel the procedure. . Sickness: If you have a cold, fever, or any active infections, call and cancel the procedure. . Arrival: You must be in the facility at least 30 minutes prior to your scheduled procedure. . Children: Do not bring children with you. . Dress appropriately: Bring dark clothing that you would not mind if they get stained. . Valuables: Do not bring any jewelry or valuables. Procedure appointments are reserved for interventional treatments only. Marland Kitchen No Prescription Refills. . No medication changes will be discussed during procedure  appointments. . No disability issues will be discussed. ____________________________________________________________________________________________  Preparing for Procedure with Sedation Instructions: . Oral Intake: Do not eat or drink anything for at least 8 hours prior to your procedure. . Transportation: Public transportation is not allowed. Bring an adult driver. The driver must be physically present in our waiting room before any procedure can be started. Marland Kitchen Physical Assistance: Bring an adult capable of physically assisting you, in the event you need help. . Blood Pressure Medicine: Take your blood pressure medicine with a sip of water the morning of the procedure. . Insulin: Take only  of your normal insulin dose. . Preventing infections: Shower with an antibacterial soap the morning of your procedure. . Build-up your immune system: Take 1000 mg of Vitamin C with every meal (3 times a day) the day prior to your procedure. . Pregnancy: If you are pregnant, call and cancel the procedure. . Sickness: If you have a cold, fever, or any active infections, call and cancel the procedure. . Arrival: You must be in the facility at least 30 minutes prior to your scheduled procedure. . Children: Do not bring children with you. . Dress appropriately: Bring dark clothing that you would not mind if they get stained. . Valuables: Do not bring any jewelry or valuables. Procedure appointments are reserved for interventional treatments only. Marland Kitchen No Prescription Refills. . No medication changes will be discussed during procedure appointments. No disability issues will be discussed.Facet Blocks Patient Information  Description: The facets are joints in the spine between the vertebrae.  Like any joints in the body, facets can become irritated and painful.  Arthritis can also effect the facets.  By injecting  steroids and local anesthetic in and around these joints, we can temporarily block the nerve supply  to them.  Steroids act directly on irritated nerves and tissues to reduce selling and inflammation which often leads to decreased pain.  Facet blocks may be done anywhere along the spine from the neck to the low back depending upon the location of your pain.   After numbing the skin with local anesthetic (like Novocaine), a small needle is passed onto the facet joints under x-ray guidance.  You may experience a sensation of pressure while this is being done.  The entire block usually lasts about 15-25 minutes.   Conditions which may be treated by facet blocks:   Low back/buttock pain  Neck/shoulder pain  Certain types of headaches  Preparation for the injection:  1. Do not eat any solid food or dairy products within 8 hours of your appointment. 2. You may drink clear liquid up to 3 hours before appointment.  Clear liquids include water, black coffee, juice or soda.  No milk or cream please. 3. You may take your regular medication, including pain medications, with a sip of water before your appointment.  Diabetics should hold regular insulin (if taken separately) and take 1/2 normal NPH dose the morning of the procedure.  Carry some sugar containing items with you to your appointment. 4. A driver must accompany you and be prepared to drive you home after your procedure. 5. Bring all your current medications with you. 6. An IV may be inserted and sedation may be given at the discretion of the physician. 7. A blood pressure cuff, EKG and other monitors will often be applied during the procedure.  Some patients may need to have extra oxygen administered for a short period. 8. You will be asked to provide medical information, including your allergies and medications, prior to the procedure.  We must know immediately if you are taking blood thinners (like Coumadin/Warfarin) or if you are allergic to IV iodine contrast (dye).  We must know if you could possible be pregnant.  Possible  side-effects:   Bleeding from needle site  Infection (rare, may require surgery)  Nerve injury (rare)  Numbness & tingling (temporary)  Difficulty urinating (rare, temporary)  Spinal headache (a headache worse with upright posture)  Light-headedness (temporary)  Pain at injection site (serveral days)  Decreased blood pressure (rare, temporary)  Weakness in arm/leg (temporary)  Pressure sensation in back/neck (temporary)   Call if you experience:   Fever/chills associated with headache or increased back/neck pain  Headache worsened by an upright position  New onset, weakness or numbness of an extremity below the injection site  Hives or difficulty breathing (go to the emergency room)  Inflammation or drainage at the injection site(s)  Severe back/neck pain greater than usual  New symptoms which are concerning to you  Please note:  Although the local anesthetic injected can often make your back or neck feel good for several hours after the injection, the pain will likely return. It takes 3-7 days for steroids to work.  You may not notice any pain relief for at least one week.  If effective, we will often do a series of 2-3 injections spaced 3-6 weeks apart to maximally decrease your pain.  After the initial series, you may be a candidate for a more permanent nerve block of the facets.  If you have any questions, please call #[phone]) [facility]

## 2017-04-20 ENCOUNTER — Ambulatory Visit (HOSPITAL_BASED_OUTPATIENT_CLINIC_OR_DEPARTMENT_OTHER): Payer: Medicare Other | Admitting: Pain Medicine

## 2017-04-20 ENCOUNTER — Encounter: Payer: Self-pay | Admitting: Pain Medicine

## 2017-04-20 ENCOUNTER — Ambulatory Visit
Admission: RE | Admit: 2017-04-20 | Discharge: 2017-04-20 | Disposition: A | Discharge status: Home / Self Care | Payer: Medicare Other | Source: Ambulatory Visit | Attending: Pain Medicine | Admitting: Pain Medicine

## 2017-04-20 VITALS — BP 125/58 | HR 68 | Temp 97.5°F | Resp 14 | Ht 63.5 in | Wt 155.0 lb

## 2017-04-20 DIAGNOSIS — M5441 Lumbago with sciatica, right side: Secondary | ICD-10-CM | POA: Diagnosis not present

## 2017-04-20 DIAGNOSIS — M5442 Lumbago with sciatica, left side: Secondary | ICD-10-CM | POA: Diagnosis present

## 2017-04-20 DIAGNOSIS — M4696 Unspecified inflammatory spondylopathy, lumbar region: Secondary | ICD-10-CM

## 2017-04-20 DIAGNOSIS — M542 Cervicalgia: Secondary | ICD-10-CM | POA: Insufficient documentation

## 2017-04-20 DIAGNOSIS — G8929 Other chronic pain: Secondary | ICD-10-CM | POA: Diagnosis present

## 2017-04-20 DIAGNOSIS — M47816 Spondylosis without myelopathy or radiculopathy, lumbar region: Secondary | ICD-10-CM

## 2017-04-20 DIAGNOSIS — M545 Low back pain: Secondary | ICD-10-CM

## 2017-04-20 MED ORDER — LACTATED RINGERS IV SOLN
1000.0000 mL | Freq: Once | INTRAVENOUS | Status: AC
  Administered 2017-04-20: 1000 mL via INTRAVENOUS

## 2017-04-20 MED ORDER — LIDOCAINE HCL (PF) 2 % IJ SOLN
INTRAMUSCULAR | Status: AC
Start: 2017-04-20 — End: ?
  Filled 2017-04-20: qty 10

## 2017-04-20 MED ORDER — MIDAZOLAM HCL 5 MG/5ML IJ SOLN
1.0000 mg | INTRAMUSCULAR | Status: DC | PRN
  Administered 2017-04-20: 5 mg via INTRAVENOUS
  Filled 2017-04-20: qty 5

## 2017-04-20 MED ORDER — FENTANYL CITRATE (PF) 100 MCG/2ML IJ SOLN
25.0000 ug | INTRAMUSCULAR | Status: DC | PRN
Start: 2017-04-20 — End: 2017-04-20
  Administered 2017-04-20: 100 ug via INTRAVENOUS
  Filled 2017-04-20: qty 2

## 2017-04-20 MED ORDER — ROPIVACAINE HCL 2 MG/ML IJ SOLN
10.0000 mL | Freq: Once | INTRAMUSCULAR | Status: AC
  Administered 2017-04-20: 10 mL via PERINEURAL
  Filled 2017-04-20: qty 10

## 2017-04-20 MED ORDER — LIDOCAINE HCL (PF) 1.5 % IJ SOLN
20.0000 mL | Freq: Once | INTRAMUSCULAR | Status: DC
  Filled 2017-04-20: qty 20

## 2017-04-20 NOTE — Progress Notes (Signed)
Patient's Name: Zoe Sanchez  MRN: [id number]  Referring Provider: Denton Lank, MD  DOB: 05/03/1956  PCP: Denton Lank, MD  DOS: 04/20/2017  Note by: Gaspar Cola, MD  Service setting: Ambulatory outpatient  Specialty: Interventional Pain Management  Patient type: Established  Location: [facility] (AMB) Pain Management Facility  Visit type: Interventional Procedure   Primary Reason for Visit: Interventional Pain Management Treatment. CC: Back Pain (lower) and Neck Pain  Procedure:  Anesthesia, Analgesia, Anxiolysis:  Type: Diagnostic Medial Branch Facet Block #2 (NO STEROIDS) Region: Lumbar Level: L2, L3, L4, L5, & S1 Medial Branch Level(s) Laterality: Bilateral  Type: Local Anesthesia with Moderate (Conscious) Sedation Local Anesthetic: Lidocaine 1% Route: Intravenous (IV) IV Access: Secured Sedation: Meaningful verbal contact was maintained at all times during the procedure  Indication(s): Analgesia and Anxiety  Indications: 1. Lumbar facet syndrome (Bilateral) (R>L)   2. Lumbar facet arthropathy (HCC)   3. Lumbar spondylosis   4. Chronic low back pain (Location of Tertiary source of pain) (Bilateral) (R>L)    Pain Score: Pre-procedure: 2 /10 Post-procedure: 0-No pain/10  Pre-op Assessment:  Zoe Sanchez is a 61 y.o. (year old), female patient, seen today for interventional treatment. She  has a past surgical history that includes Abdominal hysterectomy (1986); Tonsillectomy (1957); Skin lesion excision; Breast surgery; and Breast biopsy (Right, 2016). Zoe Sanchez has a current medication list which includes the following prescription(s): amitriptyline, vitamin d3, cyanocobalamin, cyclobenzaprine, dicyclomine, estradiol, fluticasone, gemfibrozil, hydrocodone-acetaminophen, hydrocodone-acetaminophen, hydrocodone-acetaminophen, incruse ellipta, montelukast, omeprazole, pregabalin, pregabalin, propranolol, propranolol er, salmeterol, and zolpidem, and the following Facility-Administered  Medications: fentanyl, lidocaine, and midazolam. Her primarily concern today is the Back Pain (lower) and Neck Pain  Initial Vital Signs: There were no vitals taken for this visit. BMI: Estimated body mass index is 27.03 kg/m as calculated from the following:   Height as of this encounter: 5' 3.5" (1.613 m).   Weight as of this encounter: 155 lb (70.3 kg).  Risk Assessment: Allergies: Reviewed. She is allergic to prednisone and other.  Allergy Precautions: None required Coagulopathies: Reviewed. None identified.  Blood-thinner therapy: None at this time Active Infection(s): Reviewed. None identified. Zoe Sanchez is afebrile  Site Confirmation: Zoe Sanchez was asked to confirm the procedure and laterality before marking the site Procedure checklist: Completed Consent: Before the procedure and under the influence of no sedative(s), amnesic(s), or anxiolytics, the patient was informed of the treatment options, risks and possible complications. To fulfill our ethical and legal obligations, as recommended by the American Medical Association's Code of Ethics, I have informed the patient of my clinical impression; the nature and purpose of the treatment or procedure; the risks, benefits, and possible complications of the intervention; the alternatives, including doing nothing; the risk(s) and benefit(s) of the alternative treatment(s) or procedure(s); and the risk(s) and benefit(s) of doing nothing. The patient was provided information about the general risks and possible complications associated with the procedure. These may include, but are not limited to: failure to achieve desired goals, infection, bleeding, organ or nerve damage, allergic reactions, paralysis, and death. In addition, the patient was informed of those risks and complications associated to Spine-related procedures, such as failure to decrease pain; infection (i.e.: Meningitis, epidural or intraspinal abscess); bleeding (i.e.: epidural  hematoma, subarachnoid hemorrhage, or any other type of intraspinal or peri-dural bleeding); organ or nerve damage (i.e.: Any type of peripheral nerve, nerve root, or spinal cord injury) with subsequent damage to sensory, motor, and/or autonomic systems, resulting in permanent pain, numbness, and/or weakness  of one or several areas of the body; allergic reactions; (i.e.: anaphylactic reaction); and/or death. Furthermore, the patient was informed of those risks and complications associated with the medications. These include, but are not limited to: allergic reactions (i.e.: anaphylactic or anaphylactoid reaction(s)); adrenal axis suppression; blood sugar elevation that in diabetics may result in ketoacidosis or comma; water retention that in patients with history of congestive heart failure may result in shortness of breath, pulmonary edema, and decompensation with resultant heart failure; weight gain; swelling or edema; medication-induced neural toxicity; particulate matter embolism and blood vessel occlusion with resultant organ, and/or nervous system infarction; and/or aseptic necrosis of one or more joints. Finally, the patient was informed that Medicine is not an exact science; therefore, there is also the possibility of unforeseen or unpredictable risks and/or possible complications that may result in a catastrophic outcome. The patient indicated having understood very clearly. We have given the patient no guarantees and we have made no promises. Enough time was given to the patient to ask questions, all of which were answered to the patient's satisfaction. Zoe Sanchez has indicated that she wanted to continue with the procedure. Attestation: I, the ordering provider, attest that I have discussed with the patient the benefits, risks, side-effects, alternatives, likelihood of achieving goals, and potential problems during recovery for the procedure that I have provided informed consent. Date: 04/20/2017;  Time: 12:16 PM  Pre-Procedure Preparation:  Monitoring: As per clinic protocol. Respiration, ETCO2, SpO2, BP, heart rate and rhythm monitor placed and checked for adequate function Safety Precautions: Patient was assessed for positional comfort and pressure points before starting the procedure. Time-out: I initiated and conducted the "Time-out" before starting the procedure, as per protocol. The patient was asked to participate by confirming the accuracy of the "Time Out" information. Verification of the correct person, site, and procedure were performed and confirmed by me, the nursing staff, and the patient. "Time-out" conducted as per Joint Commission's Universal Protocol (UP.01.01.01). "Time-out" Date & Time: 04/20/2017; 1300 hrs.  Description of Procedure Process:   Position: Prone Target Area: For Lumbar Facet blocks, the target is the groove formed by the junction of the transverse process and superior articular process. For the L5 dorsal ramus, the target is the notch between superior articular process and sacral ala. For the S1 dorsal ramus, the target is the superior and lateral edge of the posterior S1 Sacral foramen. Approach: Paramedial approach. Area Prepped: Entire Posterior Lumbosacral Region Prepping solution: ChloraPrep (2% chlorhexidine gluconate and 70% isopropyl alcohol) Safety Precautions: Aspiration looking for blood return was conducted prior to all injections. At no point did we inject any substances, as a needle was being advanced. No attempts were made at seeking any paresthesias. Safe injection practices and needle disposal techniques used. Medications properly checked for expiration dates. SDV (single dose vial) medications used. Description of the Procedure: Protocol guidelines were followed. The patient was placed in position over the fluoroscopy table. The target area was identified and the area prepped in the usual manner. Skin desensitized using vapocoolant spray. Skin  & deeper tissues infiltrated with local anesthetic. Appropriate amount of time allowed to pass for local anesthetics to take effect. The procedure needle was introduced through the skin, ipsilateral to the reported pain, and advanced to the target area. Employing the "Medial Branch Technique", the needles were advanced to the angle made by the superior and medial portion of the transverse process, and the lateral and inferior portion of the superior articulating process of the targeted vertebral  bodies. This area is known as "Burton's Eye" or the "Eye of the [place] Dog". A procedure needle was introduced through the skin, and this time advanced to the angle made by the superior and medial border of the sacral ala, and the lateral border of the S1 vertebral body. This last needle was later repositioned at the superior and lateral border of the posterior S1 foramen. Negative aspiration confirmed. Solution injected in intermittent fashion, asking for systemic symptoms every 0.5cc of injectate. The needles were then removed and the area cleansed, making sure to leave some of the prepping solution back to take advantage of its long term bactericidal properties.   Illustration of the posterior view of the lumbar spine and the posterior neural structures. Laminae of L2 through S1 are labeled. DPRL5, dorsal primary ramus of L5; DPRS1, dorsal primary ramus of S1; DPR3, dorsal primary ramus of L3; FJ, facet (zygapophyseal) joint L3-L4; I, inferior articular process of L4; LB1, lateral branch of dorsal primary ramus of L1; IAB, inferior articular branches from L3 medial branch (supplies L4-L5 facet joint); IBP, intermediate branch plexus; MB3, medial branch of dorsal primary ramus of L3; NR3, third lumbar nerve root; S, superior articular process of L5; SAB, superior articular branches from L4 (supplies L4-5 facet joint also); TP3, transverse process of L3.  Vitals:   04/20/17 1322 04/20/17 1332 04/20/17 1342 04/20/17  1352  BP: (!) 108/59 127/60 117/65 (!) 125/58  Pulse:      Resp: 10 13 18 14   Temp: (!) 97.5 F (36.4 C)     TempSrc: Temporal     SpO2: 97% 99% 98% 99%  Weight:      Height:        Start Time: 1300 hrs. End Time: 1313 hrs. Materials:  Needle(s) Type: Regular needle Gauge: 22G Length: 3.5-in Medication(s): We administered lactated ringers, midazolam, fentaNYL, ropivacaine (PF) 2 mg/mL (0.2%), and ropivacaine (PF) 2 mg/mL (0.2%). Please see chart orders for dosing details.  Imaging Guidance (Spinal):  Type of Imaging Technique: Fluoroscopy Guidance (Spinal) Indication(s): Assistance in needle guidance and placement for procedures requiring needle placement in or near specific anatomical locations not easily accessible without such assistance. Exposure Time: Please see nurses notes. Contrast: None used. Fluoroscopic Guidance: I was personally present during the use of fluoroscopy. "Tunnel Vision Technique" used to obtain the best possible view of the target area. Parallax error corrected before commencing the procedure. "Direction-depth-direction" technique used to introduce the needle under continuous pulsed fluoroscopy. Once target was reached, antero-posterior, oblique, and lateral fluoroscopic projection used confirm needle placement in all planes. Images permanently stored in EMR. Interpretation: No contrast injected. I personally interpreted the imaging intraoperatively. Adequate needle placement confirmed in multiple planes. Permanent images saved into the patient's record.  Antibiotic Prophylaxis:  Indication(s): None identified Antibiotic given: None  Post-operative Assessment:  EBL: None Complications: No immediate post-treatment complications observed by team, or reported by patient. Note: The patient tolerated the entire procedure well. A repeat set of vitals were taken after the procedure and the patient was kept under observation following institutional policy, for this  type of procedure. Post-procedural neurological assessment was performed, showing return to baseline, prior to discharge. The patient was provided with post-procedure discharge instructions, including a section on how to identify potential problems. Should any problems arise concerning this procedure, the patient was given instructions to immediately contact us, at any time, without hesitation. In any case, we plan to contact the patient by telephone for a follow-up status report regarding  this interventional procedure. Comments:  No additional relevant information.  Plan of Care  Disposition: Discharge home  Discharge Date & Time: 04/20/2017; 1400 hrs.  Physician-requested Follow-up:  Return for post-procedure eval by Dr. Dossie Arbour in 2 weeks.  New Prescriptions   No medications on file   Future Appointments Date Time Provider [place]  05/24/2017 11:30 AM Milinda Pointer, MD [facility] None  06/07/2017 1:30 PM Milinda Pointer, MD [facility] None    Imaging Orders     DG C-Arm 1-60 Min-No Report  Procedure Orders     LUMBAR FACET(MEDIAL BRANCH NERVE BLOCK) MBNB  Medications ordered for procedure: Meds ordered this encounter  Medications  . lactated ringers infusion 1,000 mL  . midazolam (VERSED) 5 MG/5ML injection 1-2 mg    Make sure Flumazenil is available in the pyxis when using this medication. If oversedation occurs, administer 0.2 mg IV over 15 sec. If after 45 sec no response, administer 0.2 mg again over 1 min; may repeat at 1 min intervals; not to exceed 4 doses (1 mg)  . fentaNYL (SUBLIMAZE) injection 25-50 mcg    Make sure Narcan is available in the pyxis when using this medication. In the event of respiratory depression (RR< 8/min): Titrate NARCAN (naloxone) in increments of 0.1 to 0.2 mg IV at 2-3 minute intervals, until desired degree of reversal.  . lidocaine 1.5 % injection 20 mL    From block tray  . ropivacaine (PF) 2 mg/mL (0.2%) (NAROPIN) injection 10 mL   . ropivacaine (PF) 2 mg/mL (0.2%) (NAROPIN) injection 10 mL   Medications administered: We administered lactated ringers, midazolam, fentaNYL, ropivacaine (PF) 2 mg/mL (0.2%), and ropivacaine (PF) 2 mg/mL (0.2%).  See the medical record for exact dosing, route, and time of administration.  Primary Care Physician: Denton Lank, MD Location: [facility] Note by: Gaspar Cola, MD Date: 04/20/2017; Time: 2:42 PM  Disclaimer:  Medicine is not an Chief Strategy Officer. The only guarantee in medicine is that nothing is guaranteed. It is important to note that the decision to proceed with this intervention was based on the information collected from the patient. The Data and conclusions were drawn from the patient's questionnaire, the interview, and the physical examination. Because the information was provided in large part by the patient, it cannot be guaranteed that it has not been purposely or unconsciously manipulated. Every effort has been made to obtain as much relevant data as possible for this evaluation. It is important to note that the conclusions that lead to this procedure are derived in large part from the available data. Always take into account that the treatment will also be dependent on availability of resources and existing treatment guidelines, considered by other Pain Management Practitioners as being common knowledge and practice, at the time of the intervention. For Medico-Legal purposes, it is also important to point out that variation in procedural techniques and pharmacological choices are the acceptable norm. The indications, contraindications, technique, and results of the above procedure should only be interpreted and judged by a Board-Certified Interventional Pain Specialist with extensive familiarity and expertise in the same exact procedure and technique.

## 2017-04-20 NOTE — Patient Instructions (Addendum)
____________________________________________________________________________________________  Post-Procedure instructions Instructions:  Apply ice: Fill a plastic sandwich bag with crushed ice. Cover it with a small towel and apply to injection site. Apply for 15 minutes then remove x 15 minutes. Repeat sequence on day of procedure, until you go to bed. The purpose is to minimize swelling and discomfort after procedure.  Apply heat: Apply heat to procedure site starting the day following the procedure. The purpose is to treat any soreness and discomfort from the procedure.  Food intake: Start with clear liquids (like water) and advance to regular food, as tolerated.   Physical activities: Keep activities to a minimum for the first 8 hours after the procedure.   Driving: If you have received any sedation, you are not allowed to drive for 24 hours after your procedure.  Blood thinner: Restart your blood thinner 6 hours after your procedure. (Only for those taking blood thinners)  Insulin: As soon as you can eat, you may resume your normal dosing schedule. (Only for those taking insulin)  Infection prevention: Keep procedure site clean and dry.  Post-procedure Pain Diary: Extremely important that this be done correctly and accurately. Recorded information will be used to determine the next step in treatment.  Pain evaluated is that of treated area only. Do not include pain from an untreated area.  Complete every hour, on the hour, for the initial 8 hours. Set an alarm to help you do this part accurately.  Do not go to sleep and have it completed later. It will not be accurate.  Follow-up appointment: Keep your follow-up appointment after the procedure. Usually 2 weeks for most procedures. (6 weeks in the case of radiofrequency.) Bring you pain diary.  Expect:  From numbing medicine (AKA: Local Anesthetics): Numbness or decrease in pain.  Onset: Full effect within 15 minutes of  injected.  Duration: It will depend on the type of local anesthetic used. On the average, 1 to 8 hours.   NO STEROIDS INJECTED!!  From procedure: Some discomfort is to be expected once the numbing medicine wears off. This should be minimal if ice and heat are applied as instructed. Call if:  You experience numbness and weakness that gets worse with time, as opposed to wearing off.  New onset bowel or bladder incontinence. (Spinal procedures only)  Emergency Numbers:  Durning business hours (Monday - Thursday, 8:00 AM - 4:00 PM) (Friday, 9:00 AM - 12:00 Noon): [phone]  After hours: [phone] ____________________________________________________________________________________________  Pain Management Discharge Instructions  General Discharge Instructions :  If you need to reach your doctor call: Monday-Friday 8:00 am - 4:00 pm at [phone] or toll free [phone].  After clinic hours [phone] to have operator reach doctor.  Bring all of your medication bottles to all your appointments in the pain clinic.  To cancel or reschedule your appointment with Pain Management please remember to call 24 hours in advance to avoid a fee.  Refer to the educational materials which you have been given on: General Risks, I had my Procedure. Discharge Instructions, Post Sedation.  Post Procedure Instructions:  The drugs you were given will stay in your system until tomorrow, so for the next 24 hours you should not drive, make any legal decisions or drink any alcoholic beverages.  You may eat anything you prefer, but it is better to start with liquids then soups and crackers, and gradually work up to solid foods.  Please notify your doctor immediately if you have any unusual bleeding, trouble breathing or  pain that is not related to your normal pain.  Depending on the type of procedure that was done, some parts of your body may feel week and/or numb.  This usually clears up  by tonight or the next day.  Walk with the use of an assistive device or accompanied by an adult for the 24 hours.  You may use ice on the affected area for the first 24 hours.  Put ice in a Ziploc bag and cover with a towel and place against area 15 minutes on 15 minutes off.  You may switch to heat after 24 hours. Knee Injection A knee injection is a procedure to get medicine into your knee joint. Your health care provider puts a needle into the joint and injects medicine with an attached syringe. The injected medicine may relieve the pain, swelling, and stiffness of arthritis. The injected medicine may also help to lubricate and cushion your knee joint. You may need more than one injection. Tell a health care provider about:  Any allergies you have.  All medicines you are taking, including vitamins, herbs, eye drops, creams, and over-the-counter medicines.  Any problems you or family members have had with anesthetic medicines.  Any blood disorders you have.  Any surgeries you have had.  Any medical conditions you have. What are the risks? Generally, this is a safe procedure. However, problems may occur, including:  Infection.  Bleeding.  Worsening symptoms.  Damage to the area around your knee.  Allergic reaction to any of the medicines.  Skin reactions from repeated injections.  What happens before the procedure?  Ask your health care provider about changing or stopping your regular medicines. This is especially important if you are taking diabetes medicines or blood thinners.  Plan to have someone take you home after the procedure. What happens during the procedure?  You will sit or lie down in a position for your knee to be treated.  The skin over your kneecap will be cleaned with a germ-killing solution (antiseptic).  You will be given a medicine that numbs the area (local anesthetic). You may feel some stinging.  After your knee becomes numb, you will have a  second injection. This is the medicine. This needle is carefully placed between your kneecap and your knee. The medicine is injected into the joint space.  At the end of the procedure, the needle will be removed.  A bandage (dressing) may be placed over the injection site. The procedure may vary among health care providers and hospitals. What happens after the procedure?  You may have to move your knee through its full range of motion. This helps to get all of the medicine into your joint space.  Your blood pressure, heart rate, breathing rate, and blood oxygen level will be monitored often until the medicines you were given have worn off.  You will be watched to make sure that you do not have a reaction to the injected medicine. This information is not intended to replace advice given to you by your health care provider. Make sure you discuss any questions you have with your health care provider. Document Released: 11/20/2006 Document Revised: 01/29/2016 Document Reviewed: 07/09/2014 Elsevier Interactive Patient Education  2018 [employer].

## 2017-04-20 NOTE — Progress Notes (Signed)
Safety precautions to be maintained throughout the outpatient stay will include: orient to surroundings, keep bed in low position, maintain call bell within reach at all times, provide assistance with transfer out of bed and ambulation.  

## 2017-04-21 ENCOUNTER — Telehealth: Payer: Self-pay

## 2017-04-21 NOTE — Telephone Encounter (Signed)
Post procedure phone call.   No answer.  

## 2017-05-24 ENCOUNTER — Encounter: Payer: Self-pay | Admitting: Pain Medicine

## 2017-05-24 ENCOUNTER — Ambulatory Visit: Payer: Medicare Other | Attending: Pain Medicine | Admitting: Pain Medicine

## 2017-05-24 VITALS — BP 119/57 | HR 68 | Temp 98.0°F | Resp 16 | Ht 63.0 in | Wt 155.0 lb

## 2017-05-24 DIAGNOSIS — M4802 Spinal stenosis, cervical region: Secondary | ICD-10-CM | POA: Insufficient documentation

## 2017-05-24 DIAGNOSIS — M7138 Other bursal cyst, other site: Secondary | ICD-10-CM | POA: Insufficient documentation

## 2017-05-24 DIAGNOSIS — G8929 Other chronic pain: Secondary | ICD-10-CM | POA: Diagnosis not present

## 2017-05-24 DIAGNOSIS — E559 Vitamin D deficiency, unspecified: Secondary | ICD-10-CM | POA: Insufficient documentation

## 2017-05-24 DIAGNOSIS — F329 Major depressive disorder, single episode, unspecified: Secondary | ICD-10-CM | POA: Diagnosis not present

## 2017-05-24 DIAGNOSIS — M545 Low back pain: Secondary | ICD-10-CM | POA: Diagnosis present

## 2017-05-24 DIAGNOSIS — J449 Chronic obstructive pulmonary disease, unspecified: Secondary | ICD-10-CM | POA: Insufficient documentation

## 2017-05-24 DIAGNOSIS — M79605 Pain in left leg: Secondary | ICD-10-CM | POA: Diagnosis not present

## 2017-05-24 DIAGNOSIS — M5441 Lumbago with sciatica, right side: Secondary | ICD-10-CM | POA: Diagnosis not present

## 2017-05-24 DIAGNOSIS — M47812 Spondylosis without myelopathy or radiculopathy, cervical region: Secondary | ICD-10-CM | POA: Diagnosis not present

## 2017-05-24 DIAGNOSIS — E612 Magnesium deficiency: Secondary | ICD-10-CM | POA: Insufficient documentation

## 2017-05-24 DIAGNOSIS — M47816 Spondylosis without myelopathy or radiculopathy, lumbar region: Secondary | ICD-10-CM | POA: Insufficient documentation

## 2017-05-24 DIAGNOSIS — M797 Fibromyalgia: Secondary | ICD-10-CM | POA: Insufficient documentation

## 2017-05-24 DIAGNOSIS — I2510 Atherosclerotic heart disease of native coronary artery without angina pectoris: Secondary | ICD-10-CM | POA: Insufficient documentation

## 2017-05-24 DIAGNOSIS — G4700 Insomnia, unspecified: Secondary | ICD-10-CM | POA: Diagnosis not present

## 2017-05-24 DIAGNOSIS — K589 Irritable bowel syndrome without diarrhea: Secondary | ICD-10-CM | POA: Diagnosis not present

## 2017-05-24 DIAGNOSIS — M79606 Pain in leg, unspecified: Secondary | ICD-10-CM | POA: Insufficient documentation

## 2017-05-24 DIAGNOSIS — E538 Deficiency of other specified B group vitamins: Secondary | ICD-10-CM | POA: Diagnosis not present

## 2017-05-24 DIAGNOSIS — M4696 Unspecified inflammatory spondylopathy, lumbar region: Secondary | ICD-10-CM | POA: Diagnosis not present

## 2017-05-24 DIAGNOSIS — M5410 Radiculopathy, site unspecified: Secondary | ICD-10-CM | POA: Diagnosis not present

## 2017-05-24 DIAGNOSIS — F411 Generalized anxiety disorder: Secondary | ICD-10-CM | POA: Insufficient documentation

## 2017-05-24 DIAGNOSIS — M79604 Pain in right leg: Secondary | ICD-10-CM | POA: Insufficient documentation

## 2017-05-24 DIAGNOSIS — M50222 Other cervical disc displacement at C5-C6 level: Secondary | ICD-10-CM | POA: Diagnosis not present

## 2017-05-24 DIAGNOSIS — M542 Cervicalgia: Secondary | ICD-10-CM | POA: Diagnosis present

## 2017-05-24 DIAGNOSIS — M5442 Lumbago with sciatica, left side: Secondary | ICD-10-CM | POA: Diagnosis not present

## 2017-05-24 MED ORDER — CYCLOBENZAPRINE HCL 5 MG PO TABS: 5.0000 mg | ORAL_TABLET | Freq: Four times a day (QID) | ORAL | 2 refills | Status: DC

## 2017-05-24 NOTE — Patient Instructions (Addendum)
Radiofrequency Lesioning Radiofrequency lesioning is a procedure that is performed to relieve pain. The procedure is often used for back, neck, or arm pain. Radiofrequency lesioning involves the use of a machine that creates radio waves to make heat. During the procedure, the heat is applied to the nerve that carries the pain signal. The heat damages the nerve and interferes with the pain signal. Pain relief usually starts about 2 weeks after the procedure and lasts for 6 months to 1 year. Tell a health care provider about:  Any allergies you have.  All medicines you are taking, including vitamins, herbs, eye drops, creams, and over-the-counter medicines.  Any problems you or family members have had with anesthetic medicines.  Any blood disorders you have.  Any surgeries you have had.  Any medical conditions you have.  Whether you are pregnant or may be pregnant. What are the risks? Generally, this is a safe procedure. However, problems may occur, including:  Pain or soreness at the injection site.  Infection at the injection site.  Damage to nerves or blood vessels.  What happens before the procedure?  Ask your health care provider about: ? Changing or stopping your regular medicines. This is especially important if you are taking diabetes medicines or blood thinners. ? Taking medicines such as aspirin and ibuprofen. These medicines can thin your blood. Do not take these medicines before your procedure if your health care provider instructs you not to.  Follow instructions from your health care provider about eating or drinking restrictions.  Plan to have someone take you home after the procedure.  If you go home right after the procedure, plan to have someone with you for 24 hours. What happens during the procedure?  You will be given one or more of the following: ? A medicine to help you relax (sedative). ? A medicine to numb the area (local anesthetic).  You will be  awake during the procedure. You will need to be able to talk with the health care provider during the procedure.  With the help of a type of X-ray (fluoroscopy), the health care provider will insert a radiofrequency needle into the area to be treated.  Next, a wire that carries the radio waves (electrode) will be put through the radiofrequency needle. An electrical pulse will be sent through the electrode to verify the correct nerve. You will feel a tingling sensation, and you may have muscle twitching.  Then, the tissue that is around the needle tip will be heated by an electric current that is passed using the radiofrequency machine. This will numb the nerves.  A bandage (dressing) will be put on the insertion area after the procedure is done. The procedure may vary among health care providers and hospitals. What happens after the procedure?  Your blood pressure, heart rate, breathing rate, and blood oxygen level will be monitored often until the medicines you were given have worn off.  Return to your normal activities as directed by your health care provider. This information is not intended to replace advice given to you by your health care provider. Make sure you discuss any questions you have with your health care provider. Document Released: 04/27/2011 Document Revised: 02/04/2016 Document Reviewed: 10/06/2014 [employer] Interactive Patient Education  2018 [employer]. Preparing for Procedure with Sedation Instructions: . Oral Intake: Do not eat or drink anything for at least 8 hours prior to your procedure. . Transportation: Public transportation is not allowed. Bring an adult driver. The driver must be physically  present in our waiting room before any procedure can be started. [facility] Physical Assistance: Bring an adult capable of physically assisting you, in the event you need help. . Blood Pressure Medicine: Take your blood pressure medicine with a sip of water the morning of the  procedure. . Insulin: Take only  of your normal insulin dose. . Preventing infections: Shower with an antibacterial soap the morning of your procedure. . Build-up your immune system: Take 1000 mg of Vitamin C with every meal (3 times a day) the day prior to your procedure. . Pregnancy: If you are pregnant, call and cancel the procedure. . Sickness: If you have a cold, fever, or any active infections, call and cancel the procedure. . Arrival: You must be in the facility at least 30 minutes prior to your scheduled procedure. . Children: Do not bring children with you. . Dress appropriately: Bring dark clothing that you would not mind if they get stained. . Valuables: Do not bring any jewelry or valuables. Procedure appointments are reserved for interventional treatments only. [facility] Kitchen No Prescription Refills. . No medication changes will be discussed during procedure appointments. . No disability issues will be discussed.

## 2017-05-24 NOTE — Progress Notes (Signed)
Safety precautions to be maintained throughout the outpatient stay will include: orient to surroundings, keep bed in low position, maintain call bell within reach at all times, provide assistance with transfer out of bed and ambulation.  

## 2017-05-24 NOTE — Progress Notes (Signed)
Patient's Name: Zoe Sanchez  MRN: [id number]  Referring Provider: Denton Lank, MD  DOB: 11/14/1955  PCP: Denton Lank, MD  DOS: 05/24/2017  Note by: Gaspar Cola, MD  Service setting: Ambulatory outpatient  Specialty: Interventional Pain Management  Location: [facility] (AMB) Pain Management Facility    Patient type: Established   Primary Reason(s) for Visit: Encounter for post-procedure evaluation of chronic illness with mild to moderate exacerbation CC: Back Pain (lower) and Neck Pain (back and both sides)  HPI  Zoe Sanchez is a 61 y.o. year old, female patient, who comes today for a post-procedure evaluation. She has Microcalcifications of the breast; Encounter for therapeutic drug level monitoring; Long term current use of opiate analgesic; Opiate use (30 MME/Day); Magnesium deficiency; Vitamin B12 deficiency; Myofascial pain syndrome; Cervical facet syndrome (Bilateral) (R>L); Chronic neck pain (Primary Source of Pain) (Bilateral) (R>L); Chronic pain syndrome; Cervical spondylosis; Anxiety; Paroxysmal supraventricular tachycardia ([place]); Syncope and collapse; Fibromyalgia; Cervical herniated disc (C5-6 and C6-7); Cervical foraminal stenosis (Bilateral) (C5-6); Chronic cervical radicular pain (Bilateral) (R>L) (C8 Dermatome); Chronic low back pain [facility] source of pain) (Bilateral) (R>L); Lumbar spondylosis; Coronary atherosclerosis of native coronary artery; Generalized anxiety disorder; Intermittent urinary incontinence; History of psychiatric disorder; Depression; Family history of chronic pain; History of attempted suicide; History of suicidal ideation; Irritable bowel syndrome; Intermittent diarrhea; Chronic constipation; Insomnia secondary to chronic pain; History of hiatal hernia; Long term prescription opiate use; Vitamin D insufficiency; Opioid-induced constipation (OIC); Carpal tunnel syndrome (Left); Claustrophobia; Chronic shoulder pain (Bilateral); Cervical facet arthropathy; Lumbar  facet arthropathy ([place]); Bimalleolar fracture; Lumbar facet syndrome (Bilateral) (R>L); Chronic upper extremity pain (Secondary source of pain) (Bilateral) (R>L); L4-5 Lumbar Facet joint Synovial cyst; Chronic lower extremity pain (Bilateral) (R>L); and Chronic lumbar radicular pain (Bilateral) (S1) on her problem list. Her primarily concern today is the Back Pain (lower) and Neck Pain (back and both sides)  Pain Assessment: Location: Lower Back Radiating: down both hips and legs(back of legs to the foot to the toes) Onset: More than a month ago Duration: Chronic pain Quality: Aching, Constant, Sharp, Radiating (sharp pain in toes and feet) Severity: 2 /10 (self-reported pain score)  Note: Reported level is compatible with observation.                   Effect on ADL: pace self Timing: Constant Modifying factors: medicine  and shots  Zoe Sanchez comes in today for post-procedure evaluation after the treatment done on 04/20/2017.  Further details on both, my assessment(s), as well as the proposed treatment plan, please see below.  Post-Procedure Assessment  04/20/2017 Procedure: Diagnostic bilateral lumbar facetblock (NO STEROIDS) #2. Pre-procedure pain score:  2/10 Post-procedure pain score: 0/10 (100% relief) Influential Factors: BMI: 27.46 kg/m Intra-procedural challenges: None observed.         Assessment challenges: None detected.              Reported side-effects: None.        Post-procedural adverse reactions or complications: None reported         Sedation: Sedation provided. When no sedatives are used, the analgesic levels obtained are directly associated to the effectiveness of the local anesthetics. However, when sedation is provided, the level of analgesia obtained during the initial 1 hour following the intervention, is believed to be the result of a combination of factors. These factors may include, but are not limited to: 1. The effectiveness of the local anesthetics  used. 2. The effects of the analgesic(s) and/or  anxiolytic(s) used. 3. The degree of discomfort experienced by the patient at the time of the procedure. 4. The patients ability and reliability in recalling and recording the events. 5. The presence and influence of possible secondary gains and/or psychosocial factors. Reported result: Relief experienced during the 1st hour after the procedure: 100 % (Ultra-Short Term Relief) [name] has indicated area to have been numb during this time. Interpretative annotation: Clinically appropriate result. Analgesia during this period is likely to be Local Anesthetic and/or IV Sedative (Analgesic/Anxiolytic) related.          Effects of local anesthetic: The analgesic effects attained during this period are directly associated to the localized infiltration of local anesthetics and therefore cary significant diagnostic value as to the etiological location, or anatomical origin, of the pain. Expected duration of relief is directly dependent on the pharmacodynamics of the local anesthetic used. Long-acting (4-6 hours) anesthetics used.  Reported result: Relief during the next 4 to 6 hour after the procedure: 60 % (Short-Term Relief)            Interpretative annotation: Clinically possible results. Analgesia during this period is likely to be Local Anesthetic-related. Partial relief from local anesthetics would suggest that treated area is not 100% responsible for the patient's symptoms.  Long-term benefit: Defined as the period of time past the expected duration of local anesthetics (1 hour for short-acting and 4-6 hours for long-acting). With the possible exception of prolonged sympathetic blockade from the local anesthetics, benefits during this period are typically attributed to, or associated with, other factors such as analgesic sensory neuropraxia, antiinflammatory effects, or beneficial biochemical changes provided by agents other than the local anesthetics.   Reported result: Extended relief following procedure: 60 % (for about a week; then pain came back) (Long-Term Relief) Ms. Games reports the axial pain improved more than the extremity pain. Interpretative annotation: Unexpected result. Good relief. No permanent benefit expected. Ms. Braman requested to have NO STEROIDS injected.          Current benefits: Defined as persistent relief that continues at this point in time.   Reported results: Treated area: <25 %       Interpretative annotation: No long-term benefit. No permanent benefit expected. No steroids injected.          Interpretation: Results would suggest a successful diagnostic intervention. The patient has failed to respond to conservative therapies including over-the-counter medications, anti-inflammatories, muscle relaxants, membrane stabilizers, opioids, physical therapy modalities such as heat and ice, as well as more invasive techniques such as nerve blocks. Because Ms. Payton did attain more than 50% relief of the pain during a series of diagnostic blocks conducted in separate occasions, I believe it is medically necessary to proceed with Radiofrequency Ablation, in order to attempt gaining longer relief. Ms. Cousineau indicates having had an unsuccessful trial of physical therapy, which she described as non-beneficial and painful.  Plan:  Proceed with Radiofrequency Ablation for the purpose of attaining long-term benefits.  Laboratory Chemistry  Inflammation Markers (CRP: Acute Phase) (ESR: Chronic Phase) Lab Results  Component Value Date   ESRSEDRATE 26 09/28/2015                 Renal Function Markers Lab Results  Component Value Date   BUN 11 09/28/2015   CREATININE 0.70 09/28/2015   GFRAA >60 09/28/2015   GFRNONAA >60 09/28/2015                 Hepatic Function Markers Lab Results  Component Value Date   AST 19 09/28/2015   ALT 22 09/28/2015   ALBUMIN 3.5 09/28/2015   ALKPHOS 100 09/28/2015                  Electrolytes Lab Results  Component Value Date   NA 137 09/28/2015   K 4.2 09/28/2015   CL 106 09/28/2015   CALCIUM 8.9 09/28/2015   MG 1.8 03/09/2016                 Neuropathy Markers Lab Results  Component Value Date   ZJIRCVEL38 101 (H) 03/09/2016                 Bone Pathology Markers Lab Results  Component Value Date   ALKPHOS 100 09/28/2015   25OHVITD1 49 03/09/2016   25OHVITD2 <1.0 03/09/2016   25OHVITD3 48 03/09/2016   CALCIUM 8.9 09/28/2015                 Coagulation Parameters Lab Results  Component Value Date   INR 0.9 03/06/2012   LABPROT 12.8 03/06/2012   PLT 415 11/09/2012                 Cardiovascular Markers Lab Results  Component Value Date   HGB 12.3 11/09/2012   HCT 36.1 11/09/2012                 Note: Lab results reviewed.  Recent Diagnostic Imaging Review  Dg C-arm 1-60 Min-no Report  Result Date: 04/20/2017 Fluoroscopy was utilized by the requesting physician.  No radiographic interpretation.   Note: Imaging results reviewed.          Meds   Current Outpatient Prescriptions:  .  amitriptyline (ELAVIL) 100 MG tablet, 200 mg at bedtime. , Disp: , Rfl:  .  Cholecalciferol (VITAMIN D3) 2000 units capsule, Take 1 capsule (2,000 Units total) by mouth daily., Disp: 30 capsule, Rfl: 2 .  cyanocobalamin ([employer] VITAMIN B12) 2000 MCG tablet, Take 1 tablet (2,000 mcg total) by mouth daily., Disp: 30 tablet, Rfl: 2 .  cyclobenzaprine (FLEXERIL) 5 MG tablet, Take 1 tablet (5 mg total) by mouth 4 (four) times daily., Disp: 120 tablet, Rfl: 2 .  desonide (DESOWEN) 0.05 % cream, desonide 0.05 % topical cream as needed, Disp: , Rfl:  .  dicyclomine (BENTYL) 20 MG tablet, Take 20 mg by mouth as needed. , Disp: , Rfl:  .  estradiol (ESTRACE) 1 MG tablet, Take 1 mg by mouth daily. , Disp: , Rfl:  .  fluticasone (FLONASE) 50 MCG/ACT nasal spray, Place 2 sprays into both nostrils as needed. , Disp: , Rfl:  .  gemfibrozil (LOPID) 600 MG tablet, Take by  mouth 2 (two) times daily before a meal. , Disp: , Rfl:  .  HYDROcodone-acetaminophen (NORCO/VICODIN) 5-325 MG tablet, Take 1 tablet by mouth every 4 (four) hours as needed for severe pain., Disp: 180 tablet, Rfl: 0 .  INCRUSE ELLIPTA 62.5 MCG/INH AEPB, 1 puff as needed. , Disp: , Rfl:  .  montelukast (SINGULAIR) 10 MG tablet, Take by mouth at bedtime. , Disp: , Rfl:  .  omeprazole (PRILOSEC) 20 MG capsule, Take 20 mg by mouth 2 (two) times daily before a meal. , Disp: , Rfl:  .  pregabalin (LYRICA) 100 MG capsule, Take 1 capsule (100 mg total) by mouth at bedtime., Disp: 30 capsule, Rfl: 2 .  pregabalin (LYRICA) 25 MG capsule, Take 1 capsule (25 mg total) by mouth at bedtime., Disp:  30 capsule, Rfl: 2 .  propranolol (INDERAL) 40 MG tablet, TAKE 1 TABLET BY MOUTH 5 TIMES DAILY AS DIRECTED, Disp: , Rfl:  .  propranolol ER (INDERAL LA) 80 MG 24 hr capsule, TAKE 2 CAPSULES BY MOUTH ONCE DAILY, Disp: , Rfl:  .  salmeterol (SEREVENT DISKUS) 50 MCG/DOSE diskus inhaler, Inhale 1 puff into the lungs 2 (two) times daily. , Disp: , Rfl:  .  zolpidem (AMBIEN) 10 MG tablet, 10 mg as needed. , Disp: , Rfl:  .  HYDROcodone-acetaminophen (NORCO/VICODIN) 5-325 MG tablet, Take 1 tablet by mouth every 4 (four) hours as needed for severe pain., Disp: 180 tablet, Rfl: 0 .  HYDROcodone-acetaminophen (NORCO/VICODIN) 5-325 MG tablet, Take 1 tablet by mouth every 4 (four) hours as needed for severe pain., Disp: 180 tablet, Rfl: 0  ROS  Constitutional: Denies any fever or chills Gastrointestinal: No reported hemesis, hematochezia, vomiting, or acute GI distress Musculoskeletal: Denies any acute onset joint swelling, redness, loss of ROM, or weakness Neurological: No reported episodes of acute onset apraxia, aphasia, dysarthria, agnosia, amnesia, paralysis, loss of coordination, or loss of consciousness  Allergies  Ms. Worrell is allergic to prednisone and other.  PFSH  Drug: Ms. Mchaney  reports that she does not use  drugs. Alcohol:  reports that she does not drink alcohol. Tobacco:  reports that she has never smoked. She has never used smokeless tobacco. Medical:  has a past medical history of Abnormal heart rhythm; Abnormal mammogram (02/25/2014); Anxiety; Asthma; Awareness of heartbeats (06/29/2015); Breath shortness (06/29/2015); CAD (coronary artery disease); Cancer ([place]); Cervical radicular pain (Location of Secondary source of pain) (Bilateral) (R>L) (C8 Dermatome) (06/29/2015); Chronic pain; COPD (chronic obstructive pulmonary disease) ([place]); Degenerative disk disease; Depression; Endometriosis; Family history of chronic pain (06/29/2015); Fibromyalgia; Fibromyalgia; GERD (gastroesophageal reflux disease); Hiatal hernia; History of attempted suicide (06/29/2015); History of hiatal hernia (06/29/2015); History of psychiatric disorder (06/29/2015); History of suicidal ideation (06/29/2015); Hyperlipidemia; Insomnia; Insomnia; and Irritable bowel syndrome. Surgical: Ms. Pangle  has a past surgical history that includes Abdominal hysterectomy (1986); Tonsillectomy (1957); Skin lesion excision; Breast surgery; and Breast biopsy (Right, 2016). Family: family history includes Breast cancer in her maternal aunt; Heart disease in her father; Hypertension in her father and mother.  Constitutional Exam  General appearance: Well nourished, well developed, and well hydrated. In no apparent acute distress Vitals:   05/24/17 1134  BP: (!) 119/57  Pulse: 68  Resp: 16  Temp: 98 F (36.7 C)  SpO2: 100%  Weight: 155 lb (70.3 kg)  Height: _0  (1.6 m)   BMI Assessment: Estimated body mass index is 27.46 kg/m as calculated from the following:   Height as of this encounter: _1  (1.6 m).   Weight as of this encounter: 155 lb (70.3 kg).  BMI interpretation table: BMI level Category Range association with higher incidence of chronic pain  <18 kg/m2 Underweight   18.5-24.9 kg/m2 Ideal body weight   25-29.9 kg/m2  Overweight Increased incidence by 20%  30-34.9 kg/m2 Obese (Class I) Increased incidence by 68%  35-39.9 kg/m2 Severe obesity (Class II) Increased incidence by 136%  >40 kg/m2 Extreme obesity (Class III) Increased incidence by 254%   BMI Readings from Last 4 Encounters:  05/24/17 27.46 kg/m  04/20/17 27.03 kg/m  04/12/17 27.25 kg/m  03/22/17 28.26 kg/m   Wt Readings from Last 4 Encounters:  05/24/17 155 lb (70.3 kg)  04/20/17 155 lb (70.3 kg)  04/12/17 149 lb (67.6 kg)  03/22/17 157 lb (71.2 kg)  Psych/Mental status: Alert, oriented x 3 (person, place, & time)       Eyes: PERLA Respiratory: No evidence of acute respiratory distress  Cervical Spine Area Exam  Skin & Axial Inspection: No masses, redness, edema, swelling, or associated skin lesions Alignment: Symmetrical Functional ROM: Unrestricted ROM      Stability: No instability detected Muscle Tone/Strength: Functionally intact. No obvious neuro-muscular anomalies detected. Sensory (Neurological): Unimpaired Palpation: No palpable anomalies              Upper Extremity (UE) Exam    Side: Right upper extremity  Side: Left upper extremity  Skin & Extremity Inspection: Skin color, temperature, and hair growth are WNL. No peripheral edema or cyanosis. No masses, redness, swelling, asymmetry, or associated skin lesions. No contractures.  Skin & Extremity Inspection: Skin color, temperature, and hair growth are WNL. No peripheral edema or cyanosis. No masses, redness, swelling, asymmetry, or associated skin lesions. No contractures.  Functional ROM: Unrestricted ROM          Functional ROM: Unrestricted ROM          Muscle Tone/Strength: Functionally intact. No obvious neuro-muscular anomalies detected.  Muscle Tone/Strength: Functionally intact. No obvious neuro-muscular anomalies detected.  Sensory (Neurological): Unimpaired          Sensory (Neurological): Unimpaired          Palpation: No palpable anomalies               Palpation: No palpable anomalies              Specialized Test(s): Deferred         Specialized Test(s): Deferred          Thoracic Spine Area Exam  Skin & Axial Inspection: No masses, redness, or swelling Alignment: Symmetrical Functional ROM: Unrestricted ROM Stability: No instability detected Muscle Tone/Strength: Functionally intact. No obvious neuro-muscular anomalies detected. Sensory (Neurological): Unimpaired Muscle strength & Tone: No palpable anomalies  Lumbar Spine Area Exam  Skin & Axial Inspection: No masses, redness, or swelling Alignment: Symmetrical Functional ROM: Improved after treatment      Stability: No instability detected Muscle Tone/Strength: Functionally intact. No obvious neuro-muscular anomalies detected. Sensory (Neurological): Movement-associated pain Palpation: Complains of area being tender to palpation       Provocative Tests: Lumbar Hyperextension and rotation test: Positive bilaterally for facet joint pain. Lumbar Lateral bending test: evaluation deferred today       Patrick's Maneuver: evaluation deferred today                    Gait & Posture Assessment  Ambulation: Unassisted Gait: Relatively normal for age and body habitus Posture: WNL   Lower Extremity Exam    Side: Right lower extremity  Side: Left lower extremity  Skin & Extremity Inspection: Skin color, temperature, and hair growth are WNL. No peripheral edema or cyanosis. No masses, redness, swelling, asymmetry, or associated skin lesions. No contractures.  Skin & Extremity Inspection: Skin color, temperature, and hair growth are WNL. No peripheral edema or cyanosis. No masses, redness, swelling, asymmetry, or associated skin lesions. No contractures.  Functional ROM: Unrestricted ROM          Functional ROM: Unrestricted ROM          Muscle Tone/Strength: Functionally intact. No obvious neuro-muscular anomalies detected.  Muscle Tone/Strength: Functionally intact. No obvious  neuro-muscular anomalies detected.  Sensory (Neurological): Unimpaired  Sensory (Neurological): Unimpaired  Palpation: No palpable anomalies  Palpation: No palpable anomalies  Assessment  Primary Diagnosis & Pertinent Problem List: The primary encounter diagnosis was Lumbar facet syndrome (Bilateral) (R>L). Diagnoses of Chronic low back pain (Location of Tertiary source of pain) (Bilateral) (R>L), Lumbar facet arthropathy (HCC), L4-5 Synovial cyst of lumbar facet joint, Lumbar spondylosis, Chronic pain of lower extremity (B) (R>L), and Chronic radicular pain of lower extremity (B) (S1) were also pertinent to this visit.  Status Diagnosis  Persistent Persistent Stable 1. Lumbar facet syndrome (Bilateral) (R>L)   2. Chronic low back pain (Location of Tertiary source of pain) (Bilateral) (R>L)   3. Lumbar facet arthropathy ([place])   4. L4-5 Synovial cyst of lumbar facet joint   5. Lumbar spondylosis   6. Chronic pain of lower extremity (B) (R>L)   7. Chronic radicular pain of lower extremity (B) (S1)     Problems updated and reviewed during this visit: Problem  L4-5 Lumbar Facet joint Synovial cyst  Chronic lower extremity pain (Bilateral) (R>L)   RLE: S1 Dermatome. LLE: S1 Dermatiome.   Chronic lumbar radicular pain (Bilateral) (S1)  Chronic upper extremity pain (Secondary source of pain) (Bilateral) (R>L)  Lumbar Facet Arthropathy (Hcc)  Cervical facet arthropathy  Cervical facet syndrome (Bilateral) (R>L)  Chronic neck pain (Primary Source of Pain) (Bilateral) (R>L)  Cervical herniated disc (C5-6 and C6-7)  Cervical foraminal stenosis (Bilateral) (C5-6)  Chronic cervical radicular pain (Bilateral) (R>L) (C8 Dermatome)  Chronic low back pain (Tertiary source of pain) (Bilateral) (R>L)  Vitamin D Insufficiency  Magnesium Deficiency  Vitamin B12 Deficiency  Syncope and Collapse   Overview:  NEAR SYNCOPE    Plan of Care  Pharmacotherapy (Medications Ordered): Meds ordered  this encounter  Medications  . cyclobenzaprine (FLEXERIL) 5 MG tablet    Sig: Take 1 tablet (5 mg total) by mouth 4 (four) times daily.    Dispense:  120 tablet    Refill:  2    Do not place this medication, or any other prescription from our practice, on "Automatic Refill". Patient may have prescription filled one day early if pharmacy is closed on scheduled refill date.    Order Specific Question:   Supervising Provider    Answer:   Milinda Pointer [[id number]]   New Prescriptions   No medications on file   Medications administered today: Ms. Hemmer had no medications administered during this visit.   Procedure Orders     Radiofrequency,Lumbar Lab Orders  No laboratory test(s) ordered today   Imaging Orders  No imaging studies ordered today    Referral Orders     Ambulatory referral to Neurosurgery  Interventional management options: Planned, scheduled, and/or pending:   Referral to neurosurgery  Possible right-sided lumbar facet RFA under fluoroscopic guidance and IV sedation ( PRN   Considering:   Diagnostic bilateral cervical facetblock  Possible bilateral cervical facet radiofrequencyablation  Diagnostic right-sided cervical epiduralsteroid injection  Diagnostic bilateral intra-articular shoulder jointinjection  Diagnostic bilateral suprascapular nerveblock  Possible bilateral suprascapular nerve radiofrequencyablation  Diagnostic bilateral lumbar facetblock  Possible bilateral lumbar facet radiofrequencyablation  Diagnostic left sided carpal tunnel injection   Palliative PRN treatment(s):   None at this time    Provider-requested follow-up: Return for PRN Procedure: RFA procedure under fluoro and sedation, Med-Mgmt by Dionisio David, NP.  Future Appointments Date Time Provider [place]  06/07/2017 1:45 PM Vevelyn Francois, NP [facility] None   Primary Care Physician: Denton Lank, MD Location: [facility] Outpatient Pain Management Facility Note by:  Gaspar Cola, MD Date: 05/24/2017; Time: 2:38 PM

## 2017-06-07 ENCOUNTER — Encounter: Payer: Medicare Other | Admitting: Pain Medicine

## 2017-06-07 ENCOUNTER — Encounter: Payer: Self-pay | Admitting: Nurse Practitioner

## 2017-06-07 ENCOUNTER — Ambulatory Visit: Payer: Medicare Other | Attending: Nurse Practitioner | Admitting: Nurse Practitioner

## 2017-06-07 VITALS — BP 130/57 | HR 69 | Temp 97.6°F | Resp 16 | Ht 63.0 in | Wt 155.0 lb

## 2017-06-07 DIAGNOSIS — M47816 Spondylosis without myelopathy or radiculopathy, lumbar region: Secondary | ICD-10-CM

## 2017-06-07 DIAGNOSIS — Z79891 Long term (current) use of opiate analgesic: Secondary | ICD-10-CM | POA: Diagnosis not present

## 2017-06-07 DIAGNOSIS — M545 Low back pain: Secondary | ICD-10-CM | POA: Diagnosis not present

## 2017-06-07 DIAGNOSIS — M5442 Lumbago with sciatica, left side: Secondary | ICD-10-CM

## 2017-06-07 DIAGNOSIS — E785 Hyperlipidemia, unspecified: Secondary | ICD-10-CM | POA: Diagnosis not present

## 2017-06-07 DIAGNOSIS — Z90710 Acquired absence of both cervix and uterus: Secondary | ICD-10-CM | POA: Insufficient documentation

## 2017-06-07 DIAGNOSIS — I2510 Atherosclerotic heart disease of native coronary artery without angina pectoris: Secondary | ICD-10-CM | POA: Insufficient documentation

## 2017-06-07 DIAGNOSIS — R920 Mammographic microcalcification found on diagnostic imaging of breast: Secondary | ICD-10-CM | POA: Diagnosis not present

## 2017-06-07 DIAGNOSIS — G894 Chronic pain syndrome: Secondary | ICD-10-CM

## 2017-06-07 DIAGNOSIS — F411 Generalized anxiety disorder: Secondary | ICD-10-CM | POA: Diagnosis not present

## 2017-06-07 DIAGNOSIS — I471 Supraventricular tachycardia: Secondary | ICD-10-CM | POA: Insufficient documentation

## 2017-06-07 DIAGNOSIS — K589 Irritable bowel syndrome without diarrhea: Secondary | ICD-10-CM | POA: Diagnosis not present

## 2017-06-07 DIAGNOSIS — X58XXXA Exposure to other specified factors, initial encounter: Secondary | ICD-10-CM | POA: Diagnosis not present

## 2017-06-07 DIAGNOSIS — M4696 Unspecified inflammatory spondylopathy, lumbar region: Secondary | ICD-10-CM | POA: Diagnosis not present

## 2017-06-07 DIAGNOSIS — M542 Cervicalgia: Secondary | ICD-10-CM | POA: Insufficient documentation

## 2017-06-07 DIAGNOSIS — S82842A Displaced bimalleolar fracture of left lower leg, initial encounter for closed fracture: Secondary | ICD-10-CM | POA: Insufficient documentation

## 2017-06-07 DIAGNOSIS — J449 Chronic obstructive pulmonary disease, unspecified: Secondary | ICD-10-CM | POA: Insufficient documentation

## 2017-06-07 DIAGNOSIS — G5602 Carpal tunnel syndrome, left upper limb: Secondary | ICD-10-CM | POA: Insufficient documentation

## 2017-06-07 DIAGNOSIS — M5412 Radiculopathy, cervical region: Secondary | ICD-10-CM | POA: Diagnosis not present

## 2017-06-07 DIAGNOSIS — M50122 Cervical disc disorder at C5-C6 level with radiculopathy: Secondary | ICD-10-CM | POA: Diagnosis not present

## 2017-06-07 DIAGNOSIS — M797 Fibromyalgia: Secondary | ICD-10-CM

## 2017-06-07 DIAGNOSIS — E559 Vitamin D deficiency, unspecified: Secondary | ICD-10-CM | POA: Insufficient documentation

## 2017-06-07 DIAGNOSIS — M5441 Lumbago with sciatica, right side: Secondary | ICD-10-CM

## 2017-06-07 DIAGNOSIS — M4802 Spinal stenosis, cervical region: Secondary | ICD-10-CM | POA: Insufficient documentation

## 2017-06-07 DIAGNOSIS — E538 Deficiency of other specified B group vitamins: Secondary | ICD-10-CM | POA: Diagnosis not present

## 2017-06-07 DIAGNOSIS — F329 Major depressive disorder, single episode, unspecified: Secondary | ICD-10-CM | POA: Insufficient documentation

## 2017-06-07 DIAGNOSIS — G8929 Other chronic pain: Secondary | ICD-10-CM

## 2017-06-07 DIAGNOSIS — M549 Dorsalgia, unspecified: Secondary | ICD-10-CM | POA: Diagnosis present

## 2017-06-07 MED ORDER — PREGABALIN 100 MG PO CAPS: 100.0000 mg | ORAL_CAPSULE | Freq: Every day | ORAL | 2 refills | Status: DC

## 2017-06-07 MED ORDER — HYDROCODONE-ACETAMINOPHEN 5-325 MG PO TABS: 1.0000 | ORAL_TABLET | ORAL | 0 refills | Status: DC | PRN

## 2017-06-07 MED ORDER — PREGABALIN 25 MG PO CAPS: 25.0000 mg | ORAL_CAPSULE | Freq: Every day | ORAL | 2 refills | Status: DC

## 2017-06-07 MED ORDER — CYCLOBENZAPRINE HCL 5 MG PO TABS: 5.0000 mg | ORAL_TABLET | Freq: Four times a day (QID) | ORAL | 2 refills | Status: DC

## 2017-06-07 NOTE — Progress Notes (Signed)
Patient's Name: Zoe Sanchez  MRN: [id number]  Referring Provider: Denton Lank, MD  DOB: 1956/01/08  PCP: Denton Lank, MD  DOS: 06/07/2017  Note by: Vevelyn Francois NP  Service setting: Ambulatory outpatient  Specialty: Interventional Pain Management  Location: [facility] (AMB) Pain Management Facility    Patient type: Established    Primary Reason(s) for Visit: Encounter for prescription drug management. (Level of risk: moderate)  CC: Neck Pain (bilateral) and Back Pain (bilateral, worse on the right)  HPI  Zoe Sanchez is a 61 y.o. year old, female patient, who comes today for a medication management evaluation. She has Microcalcifications of the breast; Encounter for therapeutic drug level monitoring; Long term current use of opiate analgesic; Opiate use (30 MME/Day); Magnesium deficiency; Vitamin B12 deficiency; Myofascial pain syndrome; Cervical facet syndrome (Bilateral) (R>L); Chronic neck pain (Primary Source of Pain) (Bilateral) (R>L); Chronic pain syndrome; Cervical spondylosis; Anxiety; Paroxysmal supraventricular tachycardia ([place]); Syncope and collapse; Fibromyalgia; Cervical herniated disc (C5-6 and C6-7); Cervical foraminal stenosis (Bilateral) (C5-6); Chronic cervical radicular pain (Bilateral) (R>L) (C8 Dermatome); Chronic low back pain [facility] source of pain) (Bilateral) (R>L); Lumbar spondylosis; Coronary atherosclerosis of native coronary artery; Generalized anxiety disorder; Intermittent urinary incontinence; History of psychiatric disorder; Depression; Family history of chronic pain; History of attempted suicide; History of suicidal ideation; Irritable bowel syndrome; Intermittent diarrhea; Chronic constipation; Insomnia secondary to chronic pain; History of hiatal hernia; Long term prescription opiate use; Vitamin D insufficiency; Opioid-induced constipation (OIC); Carpal tunnel syndrome (Left); Claustrophobia; Chronic shoulder pain (Bilateral); Cervical facet arthropathy; Lumbar facet  arthropathy ([place]); Bimalleolar fracture of left ankle; Lumbar facet syndrome (Bilateral) (R>L); Chronic upper extremity pain (Secondary source of pain) (Bilateral) (R>L); L4-5 Lumbar Facet joint Synovial cyst; Chronic lower extremity pain (Bilateral) (R>L); and Chronic lumbar radicular pain (Bilateral) (S1) on her problem list. Her primarily concern today is the Neck Pain (bilateral) and Back Pain (bilateral, worse on the right)  Pain Assessment: Location: Lower, Left, Right Back (neck) Radiating: neck pain radiates down both arms to the end of fingers, back pain travels down both legs into the backs of the legs bottom of feet and into toes  Onset: More than a month ago Duration: Chronic pain Quality: Sharp, Radiating, Constant, Aching, Burning, Tingling, Other (Comment), Numbness (weakness.  more numbness coming from neck causing arms and hands to be numb and weak. ) Severity: 2 /10 (self-reported pain score)  Note: Reported level is compatible with observation.                    Effect on ADL: pace self and base activity level on the amount of pain.  Timing: Constant Modifying factors: sitting in a reclining chair or laying down.  medicine takes the edge off the pain and allows her to do more than otherwise.   Zoe Sanchez was last scheduled for an appointment on 03/02/2017 for medication management. During today's appointment we reviewed Zoe Sanchez's chronic pain status, as well as her outpatient medication regimen.  The patient  reports that she does not use drugs. Her body mass index is 27.46 kg/m.  Further details on both, my assessment(s), as well as the proposed treatment plan, please see below.  Controlled Substance Pharmacotherapy Assessment REMS (Risk Evaluation and Mitigation Strategy)  Analgesic:Hydrocodone/APAP 5/325 one every4 hours (6 per day) (30 mg/day) MME/day:30 mg/day. Zoe Billow, RN  06/07/2017  1:45 PM  Sign at close encounter Nursing Pain Medication  Assessment:  Safety precautions to be maintained throughout the outpatient  stay will include: orient to surroundings, keep bed in low position, maintain call bell within reach at all times, provide assistance with transfer out of bed and ambulation.  Medication Inspection Compliance: Pill count conducted under aseptic conditions, in front of the patient. Neither the pills nor the bottle was removed from the patient's sight at any time. Once count was completed pills were immediately returned to the patient in their original bottle.  Medication: Hydrocodone/APAP Pill/Patch Count: 106 of 180 pills remain Pill/Patch Appearance: Markings consistent with prescribed medication Bottle Appearance: Standard pharmacy container. Clearly labeled. Filled Date: 09 / 12 / 2018 Last Medication intake:  Today   Pharmacokinetics: Liberation and absorption (onset of action): WNL Distribution (time to peak effect): WNL Metabolism and excretion (duration of action): WNL         Pharmacodynamics: Desired effects: Analgesia: [name] reports >50% benefit. Functional ability: Patient reports that medication allows her to accomplish basic ADLs Clinically meaningful improvement in function (CMIF): Sustained CMIF goals met Perceived effectiveness: Described as relatively effective, allowing for increase in activities of daily living (ADL) Undesirable effects: Side-effects or Adverse reactions: None reported Monitoring: [place] PMP: Online review of the past 84-monthperiod conducted. Compliant with practice rules and regulations Last UDS on record: No results found for: SUMMARY UDS interpretation: Compliant          Medication Assessment Form: Reviewed. Patient indicates being compliant with therapy Treatment compliance: Compliant Risk Assessment Profile: Aberrant behavior: See prior evaluations. None observed or detected today Comorbid factors increasing risk of overdose: See prior notes. No additional risks  detected today Risk of substance use disorder (SUD): Low     Opioid Risk Tool - 06/07/17 1342      Psychological Disease   Psychological Disease Positive     Total Score   Opioid Risk Tool Scoring 2   Opioid Risk Interpretation Low Risk     ORT Scoring interpretation table:  Score <3 = Low Risk for SUD  Score between 4-7 = Moderate Risk for SUD  Score >8 = High Risk for Opioid Abuse   Risk Mitigation Strategies:  Patient Counseling: Covered Patient-Prescriber Agreement (PPA): Present and active  Notification to other healthcare providers: Done  Pharmacologic Plan: No change in therapy, at this time  Laboratory Chemistry  Inflammation Markers (CRP: Acute Phase) (ESR: Chronic Phase) Lab Results  Component Value Date   ESRSEDRATE 26 09/28/2015                 Renal Function Markers Lab Results  Component Value Date   BUN 11 09/28/2015   CREATININE 0.70 09/28/2015   GFRAA >60 09/28/2015   GFRNONAA >60 09/28/2015                 Hepatic Function Markers Lab Results  Component Value Date   AST 19 09/28/2015   ALT 22 09/28/2015   ALBUMIN 3.5 09/28/2015   ALKPHOS 100 09/28/2015                 Electrolytes Lab Results  Component Value Date   NA 137 09/28/2015   K 4.2 09/28/2015   CL 106 09/28/2015   CALCIUM 8.9 09/28/2015   MG 1.8 03/09/2016                 Neuropathy Markers Lab Results  Component Value Date   VITAMINB12 942 (H) 03/09/2016                 Bone Pathology Markers Lab Results  Component Value Date   ALKPHOS 100 09/28/2015   25OHVITD1 49 03/09/2016   25OHVITD2 <1.0 03/09/2016   25OHVITD3 48 03/09/2016   CALCIUM 8.9 09/28/2015                 Coagulation Parameters Lab Results  Component Value Date   INR 0.9 03/06/2012   LABPROT 12.8 03/06/2012   PLT 415 11/09/2012                 Cardiovascular Markers Lab Results  Component Value Date   HGB 12.3 11/09/2012   HCT 36.1 11/09/2012                 Note: Lab results  reviewed.  Recent Diagnostic Imaging Results  DG C-Arm 1-60 Min-No Report Fluoroscopy was utilized by the requesting physician.  No radiographic  interpretation.   Note: Imaging results reviewed.        Meds   Current Outpatient Prescriptions:  .  amitriptyline (ELAVIL) 100 MG tablet, 200 mg at bedtime. , Disp: , Rfl:  .  cyanocobalamin ([employer] VITAMIN B12) 2000 MCG tablet, Take 1 tablet (2,000 mcg total) by mouth daily., Disp: 30 tablet, Rfl: 2 .  [START ON 06/21/2017] cyclobenzaprine (FLEXERIL) 5 MG tablet, Take 1 tablet (5 mg total) by mouth 4 (four) times daily., Disp: 120 tablet, Rfl: 2 .  desonide (DESOWEN) 0.05 % cream, desonide 0.05 % topical cream as needed, Disp: , Rfl:  .  dicyclomine (BENTYL) 20 MG tablet, Take 20 mg by mouth as needed. , Disp: , Rfl:  .  estradiol (ESTRACE) 1 MG tablet, Take 1 mg by mouth daily. , Disp: , Rfl:  .  fluticasone (FLONASE) 50 MCG/ACT nasal spray, Place 2 sprays into both nostrils as needed. , Disp: , Rfl:  .  gemfibrozil (LOPID) 600 MG tablet, Take by mouth 2 (two) times daily before a meal. , Disp: , Rfl:  .  [START ON 06/22/2017] HYDROcodone-acetaminophen (NORCO/VICODIN) 5-325 MG tablet, Take 1 tablet by mouth every 4 (four) hours as needed for severe pain., Disp: 180 tablet, Rfl: 0 .  INCRUSE ELLIPTA 62.5 MCG/INH AEPB, 1 puff as needed. , Disp: , Rfl:  .  montelukast (SINGULAIR) 10 MG tablet, Take by mouth at bedtime. , Disp: , Rfl:  .  omeprazole (PRILOSEC) 20 MG capsule, Take 20 mg by mouth 2 (two) times daily before a meal. , Disp: , Rfl:  .  [START ON 06/22/2017] pregabalin (LYRICA) 100 MG capsule, Take 1 capsule (100 mg total) by mouth at bedtime., Disp: 30 capsule, Rfl: 2 .  [START ON 06/22/2017] pregabalin (LYRICA) 25 MG capsule, Take 1 capsule (25 mg total) by mouth at bedtime., Disp: 30 capsule, Rfl: 2 .  propranolol (INDERAL) 40 MG tablet, TAKE 1 TABLET BY MOUTH 5 TIMES DAILY AS DIRECTED, Disp: , Rfl:  .  propranolol ER (INDERAL LA) 80  MG 24 hr capsule, TAKE 2 CAPSULES BY MOUTH ONCE DAILY, Disp: , Rfl:  .  salmeterol (SEREVENT DISKUS) 50 MCG/DOSE diskus inhaler, Inhale 1 puff into the lungs 2 (two) times daily. , Disp: , Rfl:  .  zolpidem (AMBIEN) 10 MG tablet, 10 mg as needed. , Disp: , Rfl:  .  Cholecalciferol (VITAMIN D3) 2000 units capsule, Take 1 capsule (2,000 Units total) by mouth daily., Disp: 30 capsule, Rfl: 2 .  [START ON 07/22/2017] HYDROcodone-acetaminophen (NORCO/VICODIN) 5-325 MG tablet, Take 1 tablet by mouth every 4 (four) hours as needed for severe pain., Disp: 180 tablet, Rfl: 0 .  [  START ON 08/21/2017] HYDROcodone-acetaminophen (NORCO/VICODIN) 5-325 MG tablet, Take 1 tablet by mouth every 4 (four) hours as needed for severe pain., Disp: 180 tablet, Rfl: 0  ROS  Constitutional: Denies any fever or chills Gastrointestinal: No reported hemesis, hematochezia, vomiting, or acute GI distress Musculoskeletal: Denies any acute onset joint swelling, redness, loss of ROM, or weakness Neurological: No reported episodes of acute onset apraxia, aphasia, dysarthria, agnosia, amnesia, paralysis, loss of coordination, or loss of consciousness  Allergies  Ms. Lindeman is allergic to prednisone and other.  PFSH  Drug: Ms. Mazurkiewicz  reports that she does not use drugs. Alcohol:  reports that she does not drink alcohol. Tobacco:  reports that she has never smoked. She has never used smokeless tobacco. Medical:  has a past medical history of Abnormal heart rhythm; Abnormal mammogram (02/25/2014); Anxiety; Asthma; Awareness of heartbeats (06/29/2015); Breath shortness (06/29/2015); CAD (coronary artery disease); Cancer ([place]); Cervical radicular pain (Location of Secondary source of pain) (Bilateral) (R>L) (C8 Dermatome) (06/29/2015); Chronic pain; COPD (chronic obstructive pulmonary disease) ([place]); Degenerative disk disease; Depression; Endometriosis; Family history of chronic pain (06/29/2015); Fibromyalgia; Fibromyalgia; GERD  (gastroesophageal reflux disease); Hiatal hernia; History of attempted suicide (06/29/2015); History of hiatal hernia (06/29/2015); History of psychiatric disorder (06/29/2015); History of suicidal ideation (06/29/2015); Hyperlipidemia; Insomnia; Insomnia; and Irritable bowel syndrome. Surgical: Ms. Sandles  has a past surgical history that includes Abdominal hysterectomy (1986); Tonsillectomy (1957); Skin lesion excision; Breast surgery; and Breast biopsy (Right, 2016). Family: family history includes Breast cancer in her maternal aunt; Heart disease in her father; Hypertension in her father and mother.  Constitutional Exam  General appearance: Well nourished, well developed, and well hydrated. In no apparent acute distress Vitals:   06/07/17 1335  BP: (!) 130/57  Pulse: 69  Resp: 16  Temp: 97.6 F (36.4 C)  TempSrc: Oral  SpO2: 99%  Weight: 155 lb (70.3 kg)  Height: _0  (1.6 m)  Psych/Mental status: Alert, oriented x 3 (person, place, & time)       Eyes: PERLA Respiratory: No evidence of acute respiratory distress  Cervical Spine Area Exam  Skin & Axial Inspection: No masses, redness, edema, swelling, or associated skin lesions Alignment: Symmetrical Functional ROM: Unrestricted ROM      Stability: No instability detected Muscle Tone/Strength: Functionally intact. No obvious neuro-muscular anomalies detected. Sensory (Neurological): Unimpaired Palpation: No palpable anomalies              Upper Extremity (UE) Exam    Side: Right upper extremity  Side: Left upper extremity  Skin & Extremity Inspection: Skin color, temperature, and hair growth are WNL. No peripheral edema or cyanosis. No masses, redness, swelling, asymmetry, or associated skin lesions. No contractures.  Skin & Extremity Inspection: Skin color, temperature, and hair growth are WNL. No peripheral edema or cyanosis. No masses, redness, swelling, asymmetry, or associated skin lesions. No contractures.  Functional ROM:  Unrestricted ROM          Functional ROM: Unrestricted ROM          Muscle Tone/Strength: Functionally intact. No obvious neuro-muscular anomalies detected.  Muscle Tone/Strength: Functionally intact. No obvious neuro-muscular anomalies detected.  Sensory (Neurological): Unimpaired          Sensory (Neurological): Unimpaired          Palpation: No palpable anomalies              Palpation: No palpable anomalies              Specialized Test(s):  Deferred         Specialized Test(s): Deferred          Thoracic Spine Area Exam  Skin & Axial Inspection: No masses, redness, or swelling Alignment: Symmetrical Functional ROM: Unrestricted ROM Stability: No instability detected Muscle Tone/Strength: Functionally intact. No obvious neuro-muscular anomalies detected. Sensory (Neurological): Unimpaired Muscle strength & Tone: No palpable anomalies  Lumbar Spine Area Exam  Skin & Axial Inspection: No masses, redness, or swelling Alignment: Symmetrical Functional ROM: Unrestricted ROM      Stability: No instability detected Muscle Tone/Strength: Functionally intact. No obvious neuro-muscular anomalies detected. Sensory (Neurological): Unimpaired Palpation: Complains of area being tender to palpation       Provocative Tests: Lumbar Hyperextension and rotation test: Positive bilaterally for facet joint pain. Lumbar Lateral bending test: evaluation deferred today       Patrick's Maneuver: evaluation deferred today                    Gait & Posture Assessment  Ambulation: Unassisted Gait: Relatively normal for age and body habitus Posture: WNL   Lower Extremity Exam    Side: Right lower extremity  Side: Left lower extremity  Skin & Extremity Inspection: Skin color, temperature, and hair growth are WNL. No peripheral edema or cyanosis. No masses, redness, swelling, asymmetry, or associated skin lesions. No contractures.  Skin & Extremity Inspection: Skin color, temperature, and hair growth are WNL.  No peripheral edema or cyanosis. No masses, redness, swelling, asymmetry, or associated skin lesions. No contractures.  Functional ROM: Unrestricted ROM          Functional ROM: Unrestricted ROM          Muscle Tone/Strength: Functionally intact. No obvious neuro-muscular anomalies detected.  Muscle Tone/Strength: Functionally intact. No obvious neuro-muscular anomalies detected.  Sensory (Neurological): Unimpaired  Sensory (Neurological): Unimpaired  Palpation: No palpable anomalies  Palpation: No palpable anomalies   Assessment  Primary Diagnosis & Pertinent Problem List: The primary encounter diagnosis was Chronic low back pain [facility] source of pain) (Bilateral) (R>L). Diagnoses of Lumbar facet arthropathy (HCC), Chronic cervical radicular pain (Bilateral) (R>L) (C8 Dermatome), Fibromyalgia, Chronic pain syndrome, and Long term prescription opiate use were also pertinent to this visit.  Status Diagnosis  Controlled Controlled Controlled 1. Chronic low back pain [facility] source of pain) (Bilateral) (R>L)   2. Lumbar facet arthropathy (HCC)   3. Chronic cervical radicular pain (Bilateral) (R>L) (C8 Dermatome)   4. Fibromyalgia   5. Chronic pain syndrome   6. Long term prescription opiate use     Problems updated and reviewed during this visit: Problem  Bimalleolar Fracture of Left Ankle   Plan of Care  Pharmacotherapy (Medications Ordered): Meds ordered this encounter  Medications  . HYDROcodone-acetaminophen (NORCO/VICODIN) 5-325 MG tablet    Sig: Take 1 tablet by mouth every 4 (four) hours as needed for severe pain.    Dispense:  180 tablet    Refill:  0    Do not place this medication, or any other prescription from our practice, on "Automatic Refill". Patient may have prescription filled one day early if pharmacy is closed on scheduled refill date. Do not fill until: 06/22/2017 To last until: 07/22/2017    Order Specific Question:   Supervising Provider    Answer:    Milinda Pointer [phone]  . HYDROcodone-acetaminophen (NORCO/VICODIN) 5-325 MG tablet    Sig: Take 1 tablet by mouth every 4 (four) hours as needed for severe pain.    Dispense:  180 tablet    Refill:  0    Do not place this medication, or any other prescription from our practice, on "Automatic Refill". Patient may have prescription filled one day early if pharmacy is closed on scheduled refill date. Do not fill until: 07/22/2017 To last until: 08/21/2017    Order Specific Question:   Supervising Provider    Answer:   Milinda Pointer [phone]  . HYDROcodone-acetaminophen (NORCO/VICODIN) 5-325 MG tablet    Sig: Take 1 tablet by mouth every 4 (four) hours as needed for severe pain.    Dispense:  180 tablet    Refill:  0    Do not place this medication, or any other prescription from our practice, on "Automatic Refill". Patient may have prescription filled one day early if pharmacy is closed on scheduled refill date. Do not fill until:08/21/2017 To last until:09/20/2017    Order Specific Question:   Supervising Provider    Answer:   Milinda Pointer [phone]  . pregabalin (LYRICA) 100 MG capsule    Sig: Take 1 capsule (100 mg total) by mouth at bedtime.    Dispense:  30 capsule    Refill:  2    Do not place this medication, or any other prescription from our practice, on "Automatic Refill". Patient may have prescription filled one day early if pharmacy is closed on scheduled refill date.    Order Specific Question:   Supervising Provider    Answer:   Milinda Pointer [phone]  . pregabalin (LYRICA) 25 MG capsule    Sig: Take 1 capsule (25 mg total) by mouth at bedtime.    Dispense:  30 capsule    Refill:  2    Do not place this medication, or any other prescription from our practice, on "Automatic Refill". Patient may have prescription filled one day early if pharmacy is closed on scheduled refill date.    Order Specific Question:   Supervising Provider    Answer:   Milinda Pointer [phone]  . cyclobenzaprine (FLEXERIL) 5 MG tablet    Sig: Take 1 tablet (5 mg total) by mouth 4 (four) times daily.    Dispense:  120 tablet    Refill:  2    Do not place this medication, or any other prescription from our practice, on "Automatic Refill". Patient may have prescription filled one day early if pharmacy is closed on scheduled refill date.    Order Specific Question:   Supervising Provider    Answer:   Milinda Pointer [[id number]]   New Prescriptions   No medications on file   Medications administered today: Ms. Garrette had no medications administered during this visit. Lab-work, procedure(s), and/or referral(s): Orders Placed This Encounter  Procedures  . ToxASSURE Select 13 (MW), Urine   Imaging and/or referral(s): None  Interventional therapies: Planned, scheduled, and/or pending:   Not at this time   Considering:   Diagnostic bilateral cervical facetblock  Possible bilateral cervical facet radiofrequencyablation  Diagnostic right-sided cervical epiduralsteroid injection  Diagnostic bilateral intra-articular shoulder jointinjection  Diagnostic bilateral suprascapular nerveblock  Possible bilateral suprascapular nerve radiofrequencyablation  Diagnostic bilateral lumbar facetblock  Possible bilateral lumbar facet radiofrequencyablation  Diagnostic left sided carpal tunnel injection   Palliative PRN treatment(s):   Palliativebilateral cervical facetblock  Palliativeright-sided cervical epiduralsteroid injection  Palliativebilateral intra-articular shoulderjoint injection Palliativebilateral suprascapular nerveblock  Palliativebilateral lumbar facet block  Palliativeleft sided L4-5 lumbar epidural steroid injection  Palliativeleft sided carpal tunnelinjection    Provider-requested follow-up: Return in about 3 months (around  09/06/2017) for MedMgmt.  Future Appointments Date Time Provider [place]  08/29/2017 2:45  PM Vevelyn Francois, NP [facility] None   Primary Care Physician: Denton Lank, MD Location: [facility] Outpatient Pain Management Facility Note by: Vevelyn Francois NP Date: 06/07/2017; Time: 2:54 PM  Pain Score Disclaimer: We use the NRS-11 scale. This is a self-reported, subjective measurement of pain severity with only modest accuracy. It is used primarily to identify changes within a particular patient. It must be understood that outpatient pain scales are significantly less accurate that those used for research, where they can be applied under ideal controlled circumstances with minimal exposure to variables. In reality, the score is likely to be a combination of pain intensity and pain affect, where pain affect describes the degree of emotional arousal or changes in action readiness caused by the sensory experience of pain. Factors such as social and work situation, setting, emotional state, anxiety levels, expectation, and prior pain experience may influence pain perception and show large inter-individual differences that may also be affected by time variables.  Patient instructions provided during this appointment: Patient Instructions    ____________________________________________________________________________________________  Medication Rules  Applies to: All patients receiving prescriptions (written or electronic).  Pharmacy of record: Pharmacy where electronic prescriptions will be sent. If written prescriptions are taken to a different pharmacy, please inform the nursing staff. The pharmacy listed in the electronic medical record should be the one where you would like electronic prescriptions to be sent.  Prescription refills: Only during scheduled appointments. Applies to both, written and electronic prescriptions.  NOTE: The following applies primarily to controlled substances (Opioid* Pain Medications).   Patient's responsibilities: 1. Pain Pills: Bring all pain pills to every  appointment (except for procedure appointments). 2. Pill Bottles: Bring pills in original pharmacy bottle. Always bring newest bottle. Bring bottle, even if empty. 3. Medication refills: You are responsible for knowing and keeping track of what medications you need refilled. The day before your appointment, write a list of all prescriptions that need to be refilled. Bring that list to your appointment and give it to the admitting nurse. Prescriptions will be written only during appointments. If you forget a medication, it will not be "Called in", "Faxed", or "electronically sent". You will need to get another appointment to get these prescribed. 4. Prescription Accuracy: You are responsible for carefully inspecting your prescriptions before leaving our office. Have the discharge nurse carefully go over each prescription with you, before taking them home. Make sure that your name is accurately spelled, that your address is correct. Check the name and dose of your medication to make sure it is accurate. Check the number of pills, and the written instructions to make sure they are clear and accurate. Make sure that you are given enough medication to last until your next medication refill appointment. 5. Taking Medication: Take medication as prescribed. Never take more pills than instructed. Never take medication more frequently than prescribed. Taking less pills or less frequently is permitted and encouraged, when it comes to controlled substances (written prescriptions).  6. Inform other Doctors: Always inform, all of your healthcare providers, of all the medications you take. 7. Pain Medication from other Providers: You are not allowed to accept any additional pain medication from any other Doctor or Healthcare provider. There are two exceptions to this rule. (see below) In the event that you require additional pain medication, you are responsible for notifying us, as stated below. 8. Medication Agreement: You  are responsible for  carefully reading and following our Medication Agreement. This must be signed before receiving any prescriptions from our practice. Safely store a copy of your signed Agreement. Violations to the Agreement will result in no further prescriptions. (Additional copies of our Medication Agreement are available upon request.) 9. Laws, Rules, & Regulations: All patients are expected to follow all Federal and [employer], [employer], Rules, [employer]. Ignorance of the Laws does not constitute a valid excuse. The use of any illegal substances is prohibited. 10. Adopted CDC guidelines & recommendations: Target dosing levels will be at or below 60 MME/day. Use of benzodiazepines** is not recommended.  Exceptions: There are only two exceptions to the rule of not receiving pain medications from other Healthcare Providers. 1. Exception #1 (Emergencies): In the event of an emergency (i.e.: accident requiring emergency care), you are allowed to receive additional pain medication. However, you are responsible for: As soon as you are able, call our office [phone], at any time of the day or night, and leave a message stating your name, the date and nature of the emergency, and the name and dose of the medication prescribed. In the event that your call is answered by a member of our staff, make sure to document and save the date, time, and the name of the person that took your information.  2. Exception #2 (Planned Surgery): In the event that you are scheduled by another doctor or dentist to have any type of surgery or procedure, you are allowed (for a period no longer than 30 days), to receive additional pain medication, for the acute post-op pain. However, in this case, you are responsible for picking up a copy of our "Post-op Pain Management for Surgeons" handout, and giving it to your surgeon or dentist. This document is available at our office, and does not require an appointment to obtain it.  Simply go to our office during business hours (Monday-Thursday from 8:00 AM to 4:00 PM) (Friday 8:00 AM to 12:00 Noon) or if you have a scheduled appointment with [place], prior to your surgery, and ask for it by name. In addition, you will need to provide [place] with your name, name of your surgeon, type of surgery, and date of procedure or surgery.  *Opioid medications include: morphine, codeine, oxycodone, oxymorphone, hydrocodone, hydromorphone, meperidine, tramadol, tapentadol, buprenorphine, fentanyl, methadone. **Benzodiazepine medications include: diazepam (Valium), alprazolam (Xanax), clonazepam (Klonopine), lorazepam (Ativan), clorazepate (Tranxene), chlordiazepoxide (Librium), estazolam (Prosom), oxazepam (Serax), temazepam (Restoril), triazolam (Halcion)  ____________________________________________________________________________________________  BMI Assessment: Estimated body mass index is 27.46 kg/m as calculated from the following:   Height as of this encounter: _0  (1.6 m).   Weight as of this encounter: 155 lb (70.3 kg).  BMI interpretation table: BMI level Category Range association with higher incidence of chronic pain  <18 kg/m2 Underweight   18.5-24.9 kg/m2 Ideal body weight   25-29.9 kg/m2 Overweight Increased incidence by 20%  30-34.9 kg/m2 Obese (Class I) Increased incidence by 68%  35-39.9 kg/m2 Severe obesity (Class II) Increased incidence by 136%  >40 kg/m2 Extreme obesity (Class III) Increased incidence by 254%   BMI Readings from Last 4 Encounters:  06/07/17 27.46 kg/m  05/24/17 27.46 kg/m  04/20/17 27.03 kg/m  04/12/17 27.25 kg/m   Wt Readings from Last 4 Encounters:  06/07/17 155 lb (70.3 kg)  05/24/17 155 lb (70.3 kg)  04/20/17 155 lb (70.3 kg)  04/12/17 149 lb (67.6 kg)

## 2017-06-07 NOTE — Progress Notes (Signed)
Nursing Pain Medication Assessment:  Safety precautions to be maintained throughout the outpatient stay will include: orient to surroundings, keep bed in low position, maintain call bell within reach at all times, provide assistance with transfer out of bed and ambulation.  Medication Inspection Compliance: Pill count conducted under aseptic conditions, in front of the patient. Neither the pills nor the bottle was removed from the patient's sight at any time. Once count was completed pills were immediately returned to the patient in their original bottle.  Medication: Hydrocodone/APAP Pill/Patch Count: 106 of 180 pills remain Pill/Patch Appearance: Markings consistent with prescribed medication Bottle Appearance: Standard pharmacy container. Clearly labeled. Filled Date: 09 / 12 / 2018 Last Medication intake:  Today

## 2017-06-07 NOTE — Patient Instructions (Addendum)
____________________________________________________________________________________________  Medication Rules  Applies to: All patients receiving prescriptions (written or electronic).  Pharmacy of record: Pharmacy where electronic prescriptions will be sent. If written prescriptions are taken to a different pharmacy, please inform the nursing staff. The pharmacy listed in the electronic medical record should be the one where you would like electronic prescriptions to be sent.  Prescription refills: Only during scheduled appointments. Applies to both, written and electronic prescriptions.  NOTE: The following applies primarily to controlled substances (Opioid* Pain Medications).   Patient's responsibilities: 1. Pain Pills: Bring all pain pills to every appointment (except for procedure appointments). 2. Pill Bottles: Bring pills in original pharmacy bottle. Always bring newest bottle. Bring bottle, even if empty. 3. Medication refills: You are responsible for knowing and keeping track of what medications you need refilled. The day before your appointment, write a list of all prescriptions that need to be refilled. Bring that list to your appointment and give it to the admitting nurse. Prescriptions will be written only during appointments. If you forget a medication, it will not be "Called in", "Faxed", or "electronically sent". You will need to get another appointment to get these prescribed. 4. Prescription Accuracy: You are responsible for carefully inspecting your prescriptions before leaving our office. Have the discharge nurse carefully go over each prescription with you, before taking them home. Make sure that your name is accurately spelled, that your address is correct. Check the name and dose of your medication to make sure it is accurate. Check the number of pills, and the written instructions to make sure they are clear and accurate. Make sure that you are given enough medication to  last until your next medication refill appointment. 5. Taking Medication: Take medication as prescribed. Never take more pills than instructed. Never take medication more frequently than prescribed. Taking less pills or less frequently is permitted and encouraged, when it comes to controlled substances (written prescriptions).  6. Inform other Doctors: Always inform, all of your healthcare providers, of all the medications you take. 7. Pain Medication from other Providers: You are not allowed to accept any additional pain medication from any other Doctor or Healthcare provider. There are two exceptions to this rule. (see below) In the event that you require additional pain medication, you are responsible for notifying us, as stated below. 8. Medication Agreement: You are responsible for carefully reading and following our Medication Agreement. This must be signed before receiving any prescriptions from our practice. Safely store a copy of your signed Agreement. Violations to the Agreement will result in no further prescriptions. (Additional copies of our Medication Agreement are available upon request.) 9. Laws, Rules, & Regulations: All patients are expected to follow all Federal and State Laws, Statutes, Rules, & Regulations. Ignorance of the Laws does not constitute a valid excuse. The use of any illegal substances is prohibited. 10. Adopted CDC guidelines & recommendations: Target dosing levels will be at or below 60 MME/day. Use of benzodiazepines** is not recommended.  Exceptions: There are only two exceptions to the rule of not receiving pain medications from other Healthcare Providers. 1. Exception #1 (Emergencies): In the event of an emergency (i.e.: accident requiring emergency care), you are allowed to receive additional pain medication. However, you are responsible for: As soon as you are able, call our office [phone], at any time of the day or night, and leave a message stating your  name, the date and nature of the emergency, and the name and dose of the medication   prescribed. In the event that your call is answered by a member of our staff, make sure to document and save the date, time, and the name of the person that took your information.  2. Exception #2 (Planned Surgery): In the event that you are scheduled by another doctor or dentist to have any type of surgery or procedure, you are allowed (for a period no longer than 30 days), to receive additional pain medication, for the acute post-op pain. However, in this case, you are responsible for picking up a copy of our "Post-op Pain Management for Surgeons" handout, and giving it to your surgeon or dentist. This document is available at our office, and does not require an appointment to obtain it. Simply go to our office during business hours (Monday-Thursday from 8:00 AM to 4:00 PM) (Friday 8:00 AM to 12:00 Noon) or if you have a scheduled appointment with [place], prior to your surgery, and ask for it by name. In addition, you will need to provide [place] with your name, name of your surgeon, type of surgery, and date of procedure or surgery.  *Opioid medications include: morphine, codeine, oxycodone, oxymorphone, hydrocodone, hydromorphone, meperidine, tramadol, tapentadol, buprenorphine, fentanyl, methadone. **Benzodiazepine medications include: diazepam (Valium), alprazolam (Xanax), clonazepam (Klonopine), lorazepam (Ativan), clorazepate (Tranxene), chlordiazepoxide (Librium), estazolam (Prosom), oxazepam (Serax), temazepam (Restoril), triazolam (Halcion)  ____________________________________________________________________________________________  BMI Assessment: Estimated body mass index is 27.46 kg/m as calculated from the following:   Height as of this encounter: 5\' 3"  (1.6 m).   Weight as of this encounter: 155 lb (70.3 kg).  BMI interpretation table: BMI level Category Range association with higher incidence of chronic pain   <18 kg/m2 Underweight   18.5-24.9 kg/m2 Ideal body weight   25-29.9 kg/m2 Overweight Increased incidence by 20%  30-34.9 kg/m2 Obese (Class I) Increased incidence by 68%  35-39.9 kg/m2 Severe obesity (Class II) Increased incidence by 136%  >40 kg/m2 Extreme obesity (Class III) Increased incidence by 254%   BMI Readings from Last 4 Encounters:  06/07/17 27.46 kg/m  05/24/17 27.46 kg/m  04/20/17 27.03 kg/m  04/12/17 27.25 kg/m   Wt Readings from Last 4 Encounters:  06/07/17 155 lb (70.3 kg)  05/24/17 155 lb (70.3 kg)  04/20/17 155 lb (70.3 kg)  04/12/17 149 lb (67.6 kg)

## 2017-06-08 ENCOUNTER — Encounter: Payer: Medicare Other | Admitting: Pain Medicine

## 2017-07-31 ENCOUNTER — Telehealth: Payer: Self-pay | Admitting: *Deleted

## 2017-07-31 NOTE — Telephone Encounter (Signed)
Patient notified that she has refills left on her Flexeril.

## 2017-08-17 ENCOUNTER — Other Ambulatory Visit: Payer: Self-pay | Admitting: Pain Medicine

## 2017-08-17 DIAGNOSIS — M797 Fibromyalgia: Secondary | ICD-10-CM

## 2017-08-25 ENCOUNTER — Other Ambulatory Visit: Payer: Self-pay | Admitting: Pain Medicine

## 2017-08-25 DIAGNOSIS — M797 Fibromyalgia: Secondary | ICD-10-CM

## 2017-08-29 ENCOUNTER — Encounter: Payer: Self-pay | Admitting: Nurse Practitioner

## 2017-08-29 ENCOUNTER — Ambulatory Visit: Payer: Medicare Other | Attending: Nurse Practitioner | Admitting: Nurse Practitioner

## 2017-08-29 ENCOUNTER — Other Ambulatory Visit: Payer: Self-pay

## 2017-08-29 VITALS — BP 128/57 | HR 65 | Temp 97.7°F | Resp 16 | Ht 62.0 in | Wt 155.0 lb

## 2017-08-29 DIAGNOSIS — M79601 Pain in right arm: Secondary | ICD-10-CM | POA: Insufficient documentation

## 2017-08-29 DIAGNOSIS — F329 Major depressive disorder, single episode, unspecified: Secondary | ICD-10-CM | POA: Diagnosis not present

## 2017-08-29 DIAGNOSIS — Z915 Personal history of self-harm: Secondary | ICD-10-CM | POA: Insufficient documentation

## 2017-08-29 DIAGNOSIS — G5602 Carpal tunnel syndrome, left upper limb: Secondary | ICD-10-CM | POA: Diagnosis not present

## 2017-08-29 DIAGNOSIS — M5412 Radiculopathy, cervical region: Secondary | ICD-10-CM | POA: Diagnosis not present

## 2017-08-29 DIAGNOSIS — M5442 Lumbago with sciatica, left side: Secondary | ICD-10-CM

## 2017-08-29 DIAGNOSIS — R55 Syncope and collapse: Secondary | ICD-10-CM | POA: Insufficient documentation

## 2017-08-29 DIAGNOSIS — M545 Low back pain: Secondary | ICD-10-CM | POA: Diagnosis present

## 2017-08-29 DIAGNOSIS — K5909 Other constipation: Secondary | ICD-10-CM | POA: Diagnosis not present

## 2017-08-29 DIAGNOSIS — G894 Chronic pain syndrome: Secondary | ICD-10-CM | POA: Diagnosis not present

## 2017-08-29 DIAGNOSIS — F419 Anxiety disorder, unspecified: Secondary | ICD-10-CM | POA: Insufficient documentation

## 2017-08-29 DIAGNOSIS — M25511 Pain in right shoulder: Secondary | ICD-10-CM | POA: Insufficient documentation

## 2017-08-29 DIAGNOSIS — Z79891 Long term (current) use of opiate analgesic: Secondary | ICD-10-CM | POA: Diagnosis not present

## 2017-08-29 DIAGNOSIS — I2510 Atherosclerotic heart disease of native coronary artery without angina pectoris: Secondary | ICD-10-CM | POA: Insufficient documentation

## 2017-08-29 DIAGNOSIS — M79602 Pain in left arm: Secondary | ICD-10-CM | POA: Insufficient documentation

## 2017-08-29 DIAGNOSIS — Z79899 Other long term (current) drug therapy: Secondary | ICD-10-CM | POA: Insufficient documentation

## 2017-08-29 DIAGNOSIS — M79604 Pain in right leg: Secondary | ICD-10-CM | POA: Insufficient documentation

## 2017-08-29 DIAGNOSIS — M25512 Pain in left shoulder: Secondary | ICD-10-CM | POA: Diagnosis not present

## 2017-08-29 DIAGNOSIS — M79605 Pain in left leg: Secondary | ICD-10-CM | POA: Insufficient documentation

## 2017-08-29 DIAGNOSIS — M5441 Lumbago with sciatica, right side: Secondary | ICD-10-CM

## 2017-08-29 DIAGNOSIS — G8929 Other chronic pain: Secondary | ICD-10-CM

## 2017-08-29 DIAGNOSIS — J449 Chronic obstructive pulmonary disease, unspecified: Secondary | ICD-10-CM | POA: Insufficient documentation

## 2017-08-29 DIAGNOSIS — K589 Irritable bowel syndrome without diarrhea: Secondary | ICD-10-CM | POA: Diagnosis not present

## 2017-08-29 DIAGNOSIS — M47812 Spondylosis without myelopathy or radiculopathy, cervical region: Secondary | ICD-10-CM | POA: Insufficient documentation

## 2017-08-29 DIAGNOSIS — M50123 Cervical disc disorder at C6-C7 level with radiculopathy: Secondary | ICD-10-CM | POA: Diagnosis not present

## 2017-08-29 DIAGNOSIS — F99 Mental disorder, not otherwise specified: Secondary | ICD-10-CM | POA: Diagnosis not present

## 2017-08-29 DIAGNOSIS — M4802 Spinal stenosis, cervical region: Secondary | ICD-10-CM | POA: Insufficient documentation

## 2017-08-29 DIAGNOSIS — I471 Supraventricular tachycardia: Secondary | ICD-10-CM | POA: Insufficient documentation

## 2017-08-29 DIAGNOSIS — M47816 Spondylosis without myelopathy or radiculopathy, lumbar region: Secondary | ICD-10-CM | POA: Diagnosis not present

## 2017-08-29 DIAGNOSIS — E538 Deficiency of other specified B group vitamins: Secondary | ICD-10-CM | POA: Diagnosis not present

## 2017-08-29 DIAGNOSIS — M542 Cervicalgia: Secondary | ICD-10-CM | POA: Insufficient documentation

## 2017-08-29 DIAGNOSIS — M797 Fibromyalgia: Secondary | ICD-10-CM | POA: Diagnosis not present

## 2017-08-29 DIAGNOSIS — F411 Generalized anxiety disorder: Secondary | ICD-10-CM | POA: Diagnosis not present

## 2017-08-29 MED ORDER — HYDROCODONE-ACETAMINOPHEN 5-325 MG PO TABS: 1.0000 | ORAL_TABLET | ORAL | 0 refills | Status: DC | PRN

## 2017-08-29 MED ORDER — MELATONIN 10 MG PO CAPS: 20.0000 mg | ORAL_CAPSULE | Freq: Every evening | ORAL | 2 refills | Status: DC | PRN

## 2017-08-29 MED ORDER — PREGABALIN 25 MG PO CAPS: 25.0000 mg | ORAL_CAPSULE | Freq: Every day | ORAL | 2 refills | Status: DC

## 2017-08-29 MED ORDER — PREGABALIN 100 MG PO CAPS: 100.0000 mg | ORAL_CAPSULE | Freq: Every day | ORAL | 2 refills | Status: DC

## 2017-08-29 NOTE — Progress Notes (Signed)
Patient's Name: Zoe Sanchez  MRN: [id number]  Referring Provider: Denton Lank, MD  DOB: May 18, 1956  PCP: Denton Lank, MD  DOS: 08/29/2017  Note by: Vevelyn Francois NP  Service setting: Ambulatory outpatient  Specialty: Interventional Pain Management  Location: [facility] (AMB) Pain Management Facility    Patient type: Established    Primary Reason(s) for Visit: Encounter for prescription drug management. (Level of risk: moderate)  CC: Neck Pain and Back Pain (lower)  HPI  Zoe Sanchez is a 61 y.o. year old, female patient, who comes today for a medication management evaluation. She has Microcalcifications of the breast; Encounter for therapeutic drug level monitoring; Long term current use of opiate analgesic; Opiate use (30 MME/Day); Magnesium deficiency; Vitamin B12 deficiency; Myofascial pain syndrome; Cervical facet syndrome (Bilateral) (R>L); Chronic neck pain (Primary Source of Pain) (Bilateral) (R>L); Chronic pain syndrome; Cervical spondylosis; Anxiety; Paroxysmal supraventricular tachycardia ([place]); Syncope and collapse; Fibromyalgia; Cervical herniated disc (C5-6 and C6-7); Cervical foraminal stenosis (Bilateral) (C5-6); Chronic cervical radicular pain (Bilateral) (R>L) (C8 Dermatome); Chronic low back pain [facility] source of pain) (Bilateral) (R>L); Lumbar spondylosis; Coronary atherosclerosis of native coronary artery; Generalized anxiety disorder; Intermittent urinary incontinence; History of psychiatric disorder; Depression; Family history of chronic pain; History of attempted suicide; History of suicidal ideation; Irritable bowel syndrome; Intermittent diarrhea; Chronic constipation; Insomnia secondary to chronic pain; History of hiatal hernia; Long term prescription opiate use; Vitamin D insufficiency; Opioid-induced constipation (OIC); Carpal tunnel syndrome (Left); Claustrophobia; Chronic shoulder pain (Bilateral); Cervical facet arthropathy; Lumbar facet arthropathy; Bimalleolar fracture  of left ankle; Lumbar facet syndrome (Bilateral) (R>L); Chronic upper extremity pain (Secondary source of pain) (Bilateral) (R>L); L4-5 Lumbar Facet joint Synovial cyst; Chronic lower extremity pain (Bilateral) (R>L); and Chronic lumbar radicular pain (Bilateral) (S1) on their problem list. Her primarily concern today is the Neck Pain and Back Pain (lower)  Pain Assessment: Location: Lower Back Radiating:   Onset: More than a month ago Duration: Chronic pain Quality: Aching Severity: 3 /10 (self-reported pain score)  Note: Reported level is compatible with observation.                         When using our objective Pain Scale, levels between 6 and 10/10 are said to belong in an emergency room, as it progressively worsens from a 6/10, described as severely limiting, requiring emergency care not usually available at an outpatient pain management facility. At a 6/10 level, communication becomes difficult and requires great effort. Assistance to reach the emergency department may be required. Facial flushing and profuse sweating along with potentially dangerous increases in heart rate and blood pressure will be evident. Effect on ADL:   Timing: Intermittent Modifying factors: rest  [name] was last scheduled for an appointment on 06/07/2017 for medication management. During today's appointment we reviewed Zoe Sanchez's chronic pain status, as well as her outpatient medication regimen. She admits that her back pain is now worse than her neck. She states that it occasionally goes down the right leg. She states that it hurts in her hips really bad. She has occasional weakness with going up steps.   The patient  reports that she does not use drugs. Her body mass index is 28.35 kg/m.  Further details on both, my assessment(s), as well as the proposed treatment plan, please see below.  Controlled Substance Pharmacotherapy Assessment REMS (Risk Evaluation and Mitigation Strategy)   Analgesic:Hydrocodone/APAP 5/325 one every4 hours (6 per day) (30 mg/day) MME/day:30 mg/day. Zoe Sanchez,  Melbourne Abts, RN  08/29/2017  3:06 PM  Sign at close encounter Nursing Pain Medication Assessment:  Safety precautions to be maintained throughout the outpatient stay will include: orient to surroundings, keep bed in low position, maintain call bell within reach at all times, provide assistance with transfer out of bed and ambulation.  Medication Inspection Compliance: Pill count conducted under aseptic conditions, in front of the patient. Neither the pills nor the bottle was removed from the patient's sight at any time. Once count was completed pills were immediately returned to the patient in their original bottle.  Medication: Hydrocodone/APAP Pill/Patch Count: 129  of 180 pills remain Pill/Patch Appearance: Markings consistent with prescribed medication Bottle Appearance: Standard pharmacy container. Clearly labeled. Filled Date: 12/07 / 2018 Last Medication intake:  Today   Pharmacokinetics: Liberation and absorption (onset of action): WNL Distribution (time to peak effect): WNL Metabolism and excretion (duration of action): WNL         Pharmacodynamics: Desired effects: Analgesia: Ms. [name] reports >50% benefit. Functional ability: Patient reports that medication allows her to accomplish basic ADLs Clinically meaningful improvement in function (CMIF): Sustained CMIF goals met Perceived effectiveness: Described as relatively effective, allowing for increase in activities of daily living (ADL) Undesirable effects: Side-effects or Adverse reactions: None reported Monitoring: [place] PMP: Online review of the past 27-monthperiod conducted. Compliant with practice rules and regulations Last UDS on record: No results found for: SUMMARY UDS interpretation: Compliant          Medication Assessment Form: Reviewed. Patient indicates being compliant with therapy Treatment compliance:  Compliant Risk Assessment Profile: Aberrant behavior: See prior evaluations. None observed or detected today Comorbid factors increasing risk of overdose: See prior notes. No additional risks detected today Risk of substance use disorder (SUD): Low  ORT Scoring interpretation table:  Score <3 = Low Risk for SUD  Score between 4-7 = Moderate Risk for SUD  Score >8 = High Risk for Opioid Abuse   Risk Mitigation Strategies:  Patient Counseling: Covered Patient-Prescriber Agreement (PPA): Present and active  Notification to other healthcare providers: Done  Pharmacologic Plan: No change in therapy, at this time  Laboratory Chemistry  Inflammation Markers (CRP: Acute Phase) (ESR: Chronic Phase) Lab Results  Component Value Date   ESRSEDRATE 26 09/28/2015                 Rheumatology Markers No results found for: [facility]             Renal Function Markers Lab Results  Component Value Date   BUN 11 09/28/2015   CREATININE 0.70 09/28/2015   GFRAA >60 09/28/2015   GFRNONAA >60 09/28/2015                 Hepatic Function Markers Lab Results  Component Value Date   AST 19 09/28/2015   ALT 22 09/28/2015   ALBUMIN 3.5 09/28/2015   ALKPHOS 100 09/28/2015                 Electrolytes Lab Results  Component Value Date   NA 137 09/28/2015   K 4.2 09/28/2015   CL 106 09/28/2015   CALCIUM 8.9 09/28/2015   MG 1.8 03/09/2016                 Neuropathy Markers Lab Results  Component Value Date   VYOVZCHYI50 277(H) 03/09/2016  Bone Pathology Markers Lab Results  Component Value Date   25OHVITD1 49 03/09/2016   25OHVITD2 <1.0 03/09/2016   25OHVITD3 48 03/09/2016                 Coagulation Parameters Lab Results  Component Value Date   INR 0.9 03/06/2012   LABPROT 12.8 03/06/2012   PLT 415 11/09/2012                 Cardiovascular Markers Lab Results  Component Value Date   CKTOTAL 57 11/09/2012    CKMB < 0.5 (L) 11/09/2012   TROPONINI < 0.02 11/09/2012   HGB 12.3 11/09/2012   HCT 36.1 11/09/2012                 CA Markers No results found for: CEA, CA125, LABCA2               Note: Lab results reviewed.  Recent Diagnostic Imaging Results  DG C-Arm 1-60 Min-No Report Fluoroscopy was utilized by the requesting physician.  No radiographic  interpretation.   Complexity Note: Imaging results reviewed. Results shared with Ms. Sirek, using Layman's terms.                         Meds   Current Outpatient Medications:  .  amitriptyline (ELAVIL) 100 MG tablet, 200 mg at bedtime. , Disp: , Rfl:  .  cyclobenzaprine (FLEXERIL) 5 MG tablet, Take 1 tablet (5 mg total) by mouth 4 (four) times daily., Disp: 120 tablet, Rfl: 2 .  desonide (DESOWEN) 0.05 % cream, desonide 0.05 % topical cream as needed, Disp: , Rfl:  .  dicyclomine (BENTYL) 20 MG tablet, Take 20 mg by mouth as needed. , Disp: , Rfl:  .  estradiol (ESTRACE) 1 MG tablet, Take 1 mg by mouth daily. , Disp: , Rfl:  .  fluticasone (FLONASE) 50 MCG/ACT nasal spray, Place 2 sprays into both nostrils as needed. , Disp: , Rfl:  .  gemfibrozil (LOPID) 600 MG tablet, Take by mouth 2 (two) times daily before a meal. , Disp: , Rfl:  .  [START ON 11/19/2017] HYDROcodone-acetaminophen (NORCO/VICODIN) 5-325 MG tablet, Take 1 tablet by mouth every 4 (four) hours as needed for severe pain., Disp: 180 tablet, Rfl: 0 .  INCRUSE ELLIPTA 62.5 MCG/INH AEPB, 1 puff as needed. , Disp: , Rfl:  .  montelukast (SINGULAIR) 10 MG tablet, Take by mouth at bedtime. , Disp: , Rfl:  .  omeprazole (PRILOSEC) 20 MG capsule, Take 20 mg by mouth 2 (two) times daily before a meal. , Disp: , Rfl:  .  [START ON 09/20/2017] pregabalin (LYRICA) 100 MG capsule, Take 1 capsule (100 mg total) by mouth at bedtime., Disp: 30 capsule, Rfl: 2 .  [START ON 09/20/2017] pregabalin (LYRICA) 25 MG capsule, Take 1 capsule (25 mg total) by mouth at bedtime., Disp: 30 capsule, Rfl: 2 .   propranolol (INDERAL) 40 MG tablet, TAKE 1 TABLET BY MOUTH 5 TIMES DAILY AS DIRECTED, Disp: , Rfl:  .  propranolol ER (INDERAL LA) 80 MG 24 hr capsule, TAKE 2 CAPSULES BY MOUTH ONCE DAILY, Disp: , Rfl:  .  salmeterol (SEREVENT DISKUS) 50 MCG/DOSE diskus inhaler, Inhale 1 puff into the lungs 2 (two) times daily. , Disp: , Rfl:  .  zolpidem (AMBIEN) 10 MG tablet, 10 mg as needed. , Disp: , Rfl:  .  Cholecalciferol (VITAMIN D3) 2000 units capsule, Take 1 capsule (2,000 Units total)  by mouth daily., Disp: 30 capsule, Rfl: 2 .  cyanocobalamin ([employer] VITAMIN B12) 2000 MCG tablet, Take 1 tablet (2,000 mcg total) by mouth daily., Disp: 30 tablet, Rfl: 2 .  [START ON 10/20/2017] HYDROcodone-acetaminophen (NORCO/VICODIN) 5-325 MG tablet, Take 1 tablet by mouth every 4 (four) hours as needed for severe pain., Disp: 180 tablet, Rfl: 0 .  [START ON 09/20/2017] HYDROcodone-acetaminophen (NORCO/VICODIN) 5-325 MG tablet, Take 1 tablet by mouth every 4 (four) hours as needed for severe pain., Disp: 180 tablet, Rfl: 0 .  Melatonin 10 MG CAPS, Take 20 mg by mouth at bedtime as needed., Disp: 60 capsule, Rfl: 2  ROS  Constitutional: Denies any fever or chills Gastrointestinal: No reported hemesis, hematochezia, vomiting, or acute GI distress Musculoskeletal: Denies any acute onset joint swelling, redness, loss of ROM, or weakness Neurological: No reported episodes of acute onset apraxia, aphasia, dysarthria, agnosia, amnesia, paralysis, loss of coordination, or loss of consciousness  Allergies  Ms. Sabet is allergic to prednisone and other.  PFSH  Drug: Ms. Orama  reports that she does not use drugs. Alcohol:  reports that she does not drink alcohol. Tobacco:  reports that  has never smoked. she has never used smokeless tobacco. Medical:  has a past medical history of Abnormal heart rhythm, Abnormal mammogram (02/25/2014), Anxiety, Asthma, Awareness of heartbeats (06/29/2015), Breath shortness (06/29/2015), CAD  (coronary artery disease), Cancer (HCC), Cervical radicular pain (Location of Secondary source of pain) (Bilateral) (R>L) (C8 Dermatome) (06/29/2015), Chronic pain, COPD (chronic obstructive pulmonary disease) ([place]), Degenerative disk disease, Depression, Endometriosis, Family history of chronic pain (06/29/2015), Fibromyalgia, Fibromyalgia, GERD (gastroesophageal reflux disease), Hiatal hernia, History of attempted suicide (06/29/2015), History of hiatal hernia (06/29/2015), History of psychiatric disorder (06/29/2015), History of suicidal ideation (06/29/2015), Hyperlipidemia, Insomnia, Insomnia, and Irritable bowel syndrome. Surgical: Ms. Claytor  has a past surgical history that includes Abdominal hysterectomy (1986); Tonsillectomy (1957); Skin lesion excision; Breast surgery; and Breast biopsy (Right, 2016). Family: family history includes Breast cancer in her maternal aunt; Heart disease in her father; Hypertension in her father and mother.  Constitutional Exam  General appearance: Well nourished, well developed, and well hydrated. In no apparent acute distress Vitals:   08/29/17 1454  BP: (!) 128/57  Pulse: 65  Resp: 16  Temp: 97.7 F (36.5 C)  TempSrc: Oral  SpO2: 100%  Weight: 155 lb (70.3 kg)  Height: '5\' 2"'  (1.575 m)   BMI Assessment: Estimated body mass index is 28.35 kg/m as calculated from the following:   Height as of this encounter: '5\' 2"'  (1.575 m).   Weight as of this encounter: 155 lb (70.3 kg). Psych/Mental status: Alert, oriented x 3 (person, place, & time)       Eyes: PERLA Respiratory: No evidence of acute respiratory distress  Cervical Spine Area Exam  Skin & Axial Inspection: No masses, redness, edema, swelling, or associated skin lesions Alignment: Symmetrical Functional ROM: Unrestricted ROM      Stability: No instability detected Muscle Tone/Strength: Functionally intact. No obvious neuro-muscular anomalies detected. Sensory (Neurological):  Unimpaired Palpation: Complains of area being tender to palpation              Upper Extremity (UE) Exam    Side: Right upper extremity  Side: Left upper extremity  Skin & Extremity Inspection: Skin color, temperature, and hair growth are WNL. No peripheral edema or cyanosis. No masses, redness, swelling, asymmetry, or associated skin lesions. No contractures.  Skin & Extremity Inspection: Skin color, temperature, and hair growth are WNL.  No peripheral edema or cyanosis. No masses, redness, swelling, asymmetry, or associated skin lesions. No contractures.  Functional ROM: Unrestricted ROM          Functional ROM: Unrestricted ROM          Muscle Tone/Strength: Functionally intact. No obvious neuro-muscular anomalies detected.  Muscle Tone/Strength: Functionally intact. No obvious neuro-muscular anomalies detected.  Sensory (Neurological): Unimpaired          Sensory (Neurological): Unimpaired          Palpation: No palpable anomalies              Palpation: No palpable anomalies              Specialized Test(s): Deferred         Specialized Test(s): Deferred          Thoracic Spine Area Exam  Skin & Axial Inspection: No masses, redness, or swelling Alignment: Symmetrical Functional ROM: Unrestricted ROM Stability: No instability detected Muscle Tone/Strength: Functionally intact. No obvious neuro-muscular anomalies detected. Sensory (Neurological): Unimpaired Muscle strength & Tone: No palpable anomalies  Lumbar Spine Area Exam  Skin & Axial Inspection: No masses, redness, or swelling Alignment: Symmetrical Functional ROM: Unrestricted ROM      Stability: No instability detected Muscle Tone/Strength: Functionally intact. No obvious neuro-muscular anomalies detected. Sensory (Neurological): Unimpaired Palpation: Complains of area being tender to palpation       Provocative Tests: Lumbar Hyperextension and rotation test: evaluation deferred today       Lumbar Lateral bending test:  evaluation deferred today       Patrick's Maneuver: evaluation deferred today                    Gait & Posture Assessment  Ambulation: Unassisted Gait: Relatively normal for age and body habitus Posture: WNL   Lower Extremity Exam    Side: Right lower extremity  Side: Left lower extremity  Skin & Extremity Inspection: Skin color, temperature, and hair growth are WNL. No peripheral edema or cyanosis. No masses, redness, swelling, asymmetry, or associated skin lesions. No contractures.  Skin & Extremity Inspection: Skin color, temperature, and hair growth are WNL. No peripheral edema or cyanosis. No masses, redness, swelling, asymmetry, or associated skin lesions. No contractures.  Functional ROM: Unrestricted ROM          Functional ROM: Unrestricted ROM          Muscle Tone/Strength: Functionally intact. No obvious neuro-muscular anomalies detected.  Muscle Tone/Strength: Functionally intact. No obvious neuro-muscular anomalies detected.  Sensory (Neurological): Unimpaired  Sensory (Neurological): Unimpaired  Palpation: No palpable anomalies  Palpation: No palpable anomalies   Assessment  Primary Diagnosis & Pertinent Problem List: The primary encounter diagnosis was Chronic cervical radicular pain (Bilateral) (R>L) (C8 Dermatome). Diagnoses of Chronic low back pain [facility] source of pain) (Bilateral) (R>L), Lumbar facet syndrome (Bilateral) (R>L), Lumbar spondylosis, Fibromyalgia, and Chronic pain syndrome were also pertinent to this visit.  Status Diagnosis  Controlled Controlled Controlled 1. Chronic cervical radicular pain (Bilateral) (R>L) (C8 Dermatome)   2. Chronic low back pain [facility] source of pain) (Bilateral) (R>L)   3. Lumbar facet syndrome (Bilateral) (R>L)   4. Lumbar spondylosis   5. Fibromyalgia   6. Chronic pain syndrome     Problems updated and reviewed during this visit: No problems updated. Plan of Care  Pharmacotherapy (Medications Ordered): Meds ordered  this encounter  Medications  . HYDROcodone-acetaminophen (NORCO/VICODIN) 5-325 MG tablet    Sig: Take 1 tablet  by mouth every 4 (four) hours as needed for severe pain.    Dispense:  180 tablet    Refill:  0    Do not place this medication, or any other prescription from our practice, on "Automatic Refill". Patient may have prescription filled one day early if pharmacy is closed on scheduled refill date. Do not fill until:11/19/2017 To last until:12/19/2017    Order Specific Question:   Supervising Provider    Answer:   Milinda Pointer [phone]  . HYDROcodone-acetaminophen (NORCO/VICODIN) 5-325 MG tablet    Sig: Take 1 tablet by mouth every 4 (four) hours as needed for severe pain.    Dispense:  180 tablet    Refill:  0    Do not place this medication, or any other prescription from our practice, on "Automatic Refill". Patient may have prescription filled one day early if pharmacy is closed on scheduled refill date. Do not fill until: 10/20/2017 To last until: 11/19/2017    Order Specific Question:   Supervising Provider    Answer:   Milinda Pointer [phone]  . HYDROcodone-acetaminophen (NORCO/VICODIN) 5-325 MG tablet    Sig: Take 1 tablet by mouth every 4 (four) hours as needed for severe pain.    Dispense:  180 tablet    Refill:  0    Do not place this medication, or any other prescription from our practice, on "Automatic Refill". Patient may have prescription filled one day early if pharmacy is closed on scheduled refill date. Do not fill until: 09/20/2017 To last until: 10/20/2017    Order Specific Question:   Supervising Provider    Answer:   Milinda Pointer [phone]  . pregabalin (LYRICA) 100 MG capsule    Sig: Take 1 capsule (100 mg total) by mouth at bedtime.    Dispense:  30 capsule    Refill:  2    Do not place this medication, or any other prescription from our practice, on "Automatic Refill". Patient may have prescription filled one day early if pharmacy is closed on  scheduled refill date.    Order Specific Question:   Supervising Provider    Answer:   Milinda Pointer [phone]  . pregabalin (LYRICA) 25 MG capsule    Sig: Take 1 capsule (25 mg total) by mouth at bedtime.    Dispense:  30 capsule    Refill:  2    Do not place this medication, or any other prescription from our practice, on "Automatic Refill". Patient may have prescription filled one day early if pharmacy is closed on scheduled refill date.    Order Specific Question:   Supervising Provider    Answer:   Milinda Pointer [phone]  . Melatonin 10 MG CAPS    Sig: Take 20 mg by mouth at bedtime as needed.    Dispense:  60 capsule    Refill:  2    Do not place this medication, or any other prescription from our practice, on "Automatic Refill". Patient may have prescription filled one day early if pharmacy is closed on scheduled refill date.    Order Specific Question:   Supervising Provider    Answer:   Milinda Pointer [[id number]]  This SmartLink is deprecated. Use AVSMEDLIST instead to display the medication list for a patient. Medications administered today: Yasira L. Escalera had no medications administered during this visit. Lab-work, procedure(s), and/or referral(s): No orders of the defined types were placed in this encounter.  Imaging and/or referral(s): None  Interventional therapies: Planned, scheduled, and/or pending:  Not at this time   Considering:  Diagnostic bilateral cervical facetblock  Possible bilateral cervical facet radiofrequencyablation  Diagnostic right-sided cervical epiduralsteroid injection  Diagnostic bilateral intra-articular shoulder jointinjection  Diagnostic bilateral suprascapular nerveblock  Possible bilateral suprascapular nerve radiofrequencyablation  Diagnostic bilateral lumbar facetblock  Possible bilateral lumbar facet radiofrequencyablation  Diagnostic left sided carpal tunnel injection   Palliative PRN treatment(s):   Palliativebilateral cervical facetblock  Palliativeright-sided cervical epiduralsteroid injection  Palliativebilateral intra-articular shoulderjoint injection Palliativebilateral suprascapular nerveblock  Palliativebilateral lumbar facet block  Palliativeleft sided L4-5 lumbar epidural steroid injection  Palliativeleft sided carpal tunnelinjection     Provider-requested follow-up: Return in about 3 months (around 11/27/2017) for MedMgmt with Me Donella Stade Edison Pace).  Future Appointments  Date Time Provider [place]  11/20/2017  1:45 PM Vevelyn Francois, NP [facility] None   Primary Care Physician: Denton Lank, MD Location: [facility] Note by: Vevelyn Francois NP Date: 08/29/2017; Time: 3:30 PM  Pain Score Disclaimer: We use the NRS-11 scale. This is a self-reported, subjective measurement of pain severity with only modest accuracy. It is used primarily to identify changes within a particular patient. It must be understood that outpatient pain scales are significantly less accurate that those used for research, where they can be applied under ideal controlled circumstances with minimal exposure to variables. In reality, the score is likely to be a combination of pain intensity and pain affect, where pain affect describes the degree of emotional arousal or changes in action readiness caused by the sensory experience of pain. Factors such as social and work situation, setting, emotional state, anxiety levels, expectation, and prior pain experience may influence pain perception and show large inter-individual differences that may also be affected by time variables.  Patient instructions provided during this appointment: Patient Instructions   ____________________________________________________________________________________________  Medication Rules  Applies to: All patients receiving prescriptions (written or electronic).  Pharmacy of record:  Pharmacy where electronic prescriptions will be sent. If written prescriptions are taken to a different pharmacy, please inform the nursing staff. The pharmacy listed in the electronic medical record should be the one where you would like electronic prescriptions to be sent.  Prescription refills: Only during scheduled appointments. Applies to both, written and electronic prescriptions.  NOTE: The following applies primarily to controlled substances (Opioid* Pain Medications).   Patient's responsibilities: 1. Pain Pills: Bring all pain pills to every appointment (except for procedure appointments). 2. Pill Bottles: Bring pills in original pharmacy bottle. Always bring newest bottle. Bring bottle, even if empty. 3. Medication refills: You are responsible for knowing and keeping track of what medications you need refilled. The day before your appointment, write a list of all prescriptions that need to be refilled. Bring that list to your appointment and give it to the admitting nurse. Prescriptions will be written only during appointments. If you forget a medication, it will not be "Called in", "Faxed", or "electronically sent". You will need to get another appointment to get these prescribed. 4. Prescription Accuracy: You are responsible for carefully inspecting your prescriptions before leaving our office. Have the discharge nurse carefully go over each prescription with you, before taking them home. Make sure that your name is accurately spelled, that your address is correct. Check the name and dose of your medication to make sure it is accurate. Check the number of pills, and the written instructions to make sure they are clear and accurate. Make sure that you are given enough medication to last until your next medication  refill appointment. 5. Taking Medication: Take medication as prescribed. Never take more pills than instructed. Never take medication more frequently than prescribed. Taking less pills  or less frequently is permitted and encouraged, when it comes to controlled substances (written prescriptions).  6. Inform other Doctors: Always inform, all of your healthcare providers, of all the medications you take. 7. Pain Medication from other Providers: You are not allowed to accept any additional pain medication from any other Doctor or Healthcare provider. There are two exceptions to this rule. (see below) In the event that you require additional pain medication, you are responsible for notifying us, as stated below. 8. Medication Agreement: You are responsible for carefully reading and following our Medication Agreement. This must be signed before receiving any prescriptions from our practice. Safely store a copy of your signed Agreement. Violations to the Agreement will result in no further prescriptions. (Additional copies of our Medication Agreement are available upon request.) 9. Laws, Rules, & Regulations: All patients are expected to follow all Federal and [employer], [employer], Rules, [employer]. Ignorance of the Laws does not constitute a valid excuse. The use of any illegal substances is prohibited. 10. Adopted CDC guidelines & recommendations: Target dosing levels will be at or below 60 MME/day. Use of benzodiazepines** is not recommended.  Exceptions: There are only two exceptions to the rule of not receiving pain medications from other Healthcare Providers. 1. Exception #1 (Emergencies): In the event of an emergency (i.e.: accident requiring emergency care), you are allowed to receive additional pain medication. However, you are responsible for: As soon as you are able, call our office [phone], at any time of the day or night, and leave a message stating your name, the date and nature of the emergency, and the name and dose of the medication prescribed. In the event that your call is answered by a member of our staff, make sure to document and save the date, time, and the name  of the person that took your information.  2. Exception #2 (Planned Surgery): In the event that you are scheduled by another doctor or dentist to have any type of surgery or procedure, you are allowed (for a period no longer than 30 days), to receive additional pain medication, for the acute post-op pain. However, in this case, you are responsible for picking up a copy of our "Post-op Pain Management for Surgeons" handout, and giving it to your surgeon or dentist. This document is available at our office, and does not require an appointment to obtain it. Simply go to our office during business hours (Monday-Thursday from 8:00 AM to 4:00 PM) (Friday 8:00 AM to 12:00 Noon) or if you have a scheduled appointment with [place], prior to your surgery, and ask for it by name. In addition, you will need to provide [place] with your name, name of your surgeon, type of surgery, and date of procedure or surgery.  *Opioid medications include: morphine, codeine, oxycodone, oxymorphone, hydrocodone, hydromorphone, meperidine, tramadol, tapentadol, buprenorphine, fentanyl, methadone. **Benzodiazepine medications include: diazepam (Valium), alprazolam (Xanax), clonazepam (Klonopine), lorazepam (Ativan), clorazepate (Tranxene), chlordiazepoxide (Librium), estazolam (Prosom), oxazepam (Serax), temazepam (Restoril), triazolam (Halcion)   ____________________________________________________________________________________________  Dennis Bast were given prescriptions for Lyrica, Hydrocodone, and Melatonin

## 2017-08-29 NOTE — Patient Instructions (Addendum)
____________________________________________________________________________________________  Medication Rules  Applies to: All patients receiving prescriptions (written or electronic).  Pharmacy of record: Pharmacy where electronic prescriptions will be sent. If written prescriptions are taken to a different pharmacy, please inform the nursing staff. The pharmacy listed in the electronic medical record should be the one where you would like electronic prescriptions to be sent.  Prescription refills: Only during scheduled appointments. Applies to both, written and electronic prescriptions.  NOTE: The following applies primarily to controlled substances (Opioid* Pain Medications).   Patient's responsibilities: 1. Pain Pills: Bring all pain pills to every appointment (except for procedure appointments). 2. Pill Bottles: Bring pills in original pharmacy bottle. Always bring newest bottle. Bring bottle, even if empty. 3. Medication refills: You are responsible for knowing and keeping track of what medications you need refilled. The day before your appointment, write a list of all prescriptions that need to be refilled. Bring that list to your appointment and give it to the admitting nurse. Prescriptions will be written only during appointments. If you forget a medication, it will not be "Called in", "Faxed", or "electronically sent". You will need to get another appointment to get these prescribed. 4. Prescription Accuracy: You are responsible for carefully inspecting your prescriptions before leaving our office. Have the discharge nurse carefully go over each prescription with you, before taking them home. Make sure that your name is accurately spelled, that your address is correct. Check the name and dose of your medication to make sure it is accurate. Check the number of pills, and the written instructions to make sure they are clear and accurate. Make sure that you are given enough medication to  last until your next medication refill appointment. 5. Taking Medication: Take medication as prescribed. Never take more pills than instructed. Never take medication more frequently than prescribed. Taking less pills or less frequently is permitted and encouraged, when it comes to controlled substances (written prescriptions).  6. Inform other Doctors: Always inform, all of your healthcare providers, of all the medications you take. 7. Pain Medication from other Providers: You are not allowed to accept any additional pain medication from any other Doctor or Healthcare provider. There are two exceptions to this rule. (see below) In the event that you require additional pain medication, you are responsible for notifying us, as stated below. 8. Medication Agreement: You are responsible for carefully reading and following our Medication Agreement. This must be signed before receiving any prescriptions from our practice. Safely store a copy of your signed Agreement. Violations to the Agreement will result in no further prescriptions. (Additional copies of our Medication Agreement are available upon request.) 9. Laws, Rules, & Regulations: All patients are expected to follow all Federal and State Laws, Statutes, Rules, & Regulations. Ignorance of the Laws does not constitute a valid excuse. The use of any illegal substances is prohibited. 10. Adopted CDC guidelines & recommendations: Target dosing levels will be at or below 60 MME/day. Use of benzodiazepines** is not recommended.  Exceptions: There are only two exceptions to the rule of not receiving pain medications from other Healthcare Providers. 1. Exception #1 (Emergencies): In the event of an emergency (i.e.: accident requiring emergency care), you are allowed to receive additional pain medication. However, you are responsible for: As soon as you are able, call our office [phone], at any time of the day or night, and leave a message stating your  name, the date and nature of the emergency, and the name and dose of the medication   prescribed. In the event that your call is answered by a member of our staff, make sure to document and save the date, time, and the name of the person that took your information.  2. Exception #2 (Planned Surgery): In the event that you are scheduled by another doctor or dentist to have any type of surgery or procedure, you are allowed (for a period no longer than 30 days), to receive additional pain medication, for the acute post-op pain. However, in this case, you are responsible for picking up a copy of our "Post-op Pain Management for Surgeons" handout, and giving it to your surgeon or dentist. This document is available at our office, and does not require an appointment to obtain it. Simply go to our office during business hours (Monday-Thursday from 8:00 AM to 4:00 PM) (Friday 8:00 AM to 12:00 Noon) or if you have a scheduled appointment with [place], prior to your surgery, and ask for it by name. In addition, you will need to provide [place] with your name, name of your surgeon, type of surgery, and date of procedure or surgery.  *Opioid medications include: morphine, codeine, oxycodone, oxymorphone, hydrocodone, hydromorphone, meperidine, tramadol, tapentadol, buprenorphine, fentanyl, methadone. **Benzodiazepine medications include: diazepam (Valium), alprazolam (Xanax), clonazepam (Klonopine), lorazepam (Ativan), clorazepate (Tranxene), chlordiazepoxide (Librium), estazolam (Prosom), oxazepam (Serax), temazepam (Restoril), triazolam (Halcion)   ____________________________________________________________________________________________  Dennis Bast were given prescriptions for Lyrica, Hydrocodone, and Melatonin

## 2017-08-29 NOTE — Progress Notes (Signed)
Nursing Pain Medication Assessment:  Safety precautions to be maintained throughout the outpatient stay will include: orient to surroundings, keep bed in low position, maintain call bell within reach at all times, provide assistance with transfer out of bed and ambulation.  Medication Inspection Compliance: Pill count conducted under aseptic conditions, in front of the patient. Neither the pills nor the bottle was removed from the patient's sight at any time. Once count was completed pills were immediately returned to the patient in their original bottle.  Medication: Hydrocodone/APAP Pill/Patch Count: 129  of 180 pills remain Pill/Patch Appearance: Markings consistent with prescribed medication Bottle Appearance: Standard pharmacy container. Clearly labeled. Filled Date: 12/07 / 2018 Last Medication intake:  Today

## 2017-11-13 ENCOUNTER — Other Ambulatory Visit: Payer: Self-pay | Admitting: Nurse Practitioner

## 2017-11-14 ENCOUNTER — Other Ambulatory Visit: Payer: Self-pay | Admitting: Family Medicine

## 2017-11-14 DIAGNOSIS — Z79890 Hormone replacement therapy (postmenopausal): Secondary | ICD-10-CM

## 2017-11-14 DIAGNOSIS — Z0000 Encounter for general adult medical examination without abnormal findings: Secondary | ICD-10-CM

## 2017-11-20 ENCOUNTER — Encounter: Payer: Self-pay | Admitting: Nurse Practitioner

## 2017-11-20 ENCOUNTER — Other Ambulatory Visit: Payer: Self-pay

## 2017-11-20 ENCOUNTER — Ambulatory Visit: Payer: Medicare Other | Attending: Nurse Practitioner | Admitting: Nurse Practitioner

## 2017-11-20 VITALS — BP 105/62 | HR 71 | Temp 98.0°F | Resp 18 | Ht 63.0 in | Wt 158.0 lb

## 2017-11-20 DIAGNOSIS — G894 Chronic pain syndrome: Secondary | ICD-10-CM | POA: Diagnosis not present

## 2017-11-20 DIAGNOSIS — M79605 Pain in left leg: Secondary | ICD-10-CM | POA: Insufficient documentation

## 2017-11-20 DIAGNOSIS — F329 Major depressive disorder, single episode, unspecified: Secondary | ICD-10-CM | POA: Diagnosis not present

## 2017-11-20 DIAGNOSIS — M79601 Pain in right arm: Secondary | ICD-10-CM | POA: Diagnosis not present

## 2017-11-20 DIAGNOSIS — M47816 Spondylosis without myelopathy or radiculopathy, lumbar region: Secondary | ICD-10-CM | POA: Diagnosis not present

## 2017-11-20 DIAGNOSIS — E559 Vitamin D deficiency, unspecified: Secondary | ICD-10-CM | POA: Diagnosis not present

## 2017-11-20 DIAGNOSIS — M25511 Pain in right shoulder: Secondary | ICD-10-CM | POA: Insufficient documentation

## 2017-11-20 DIAGNOSIS — M4802 Spinal stenosis, cervical region: Secondary | ICD-10-CM | POA: Insufficient documentation

## 2017-11-20 DIAGNOSIS — E538 Deficiency of other specified B group vitamins: Secondary | ICD-10-CM | POA: Insufficient documentation

## 2017-11-20 DIAGNOSIS — M545 Low back pain: Secondary | ICD-10-CM | POA: Diagnosis present

## 2017-11-20 DIAGNOSIS — M25512 Pain in left shoulder: Secondary | ICD-10-CM | POA: Diagnosis not present

## 2017-11-20 DIAGNOSIS — M7918 Myalgia, other site: Secondary | ICD-10-CM

## 2017-11-20 DIAGNOSIS — M47812 Spondylosis without myelopathy or radiculopathy, cervical region: Secondary | ICD-10-CM

## 2017-11-20 DIAGNOSIS — M50123 Cervical disc disorder at C6-C7 level with radiculopathy: Secondary | ICD-10-CM | POA: Insufficient documentation

## 2017-11-20 DIAGNOSIS — K581 Irritable bowel syndrome with constipation: Secondary | ICD-10-CM | POA: Diagnosis not present

## 2017-11-20 DIAGNOSIS — F411 Generalized anxiety disorder: Secondary | ICD-10-CM | POA: Diagnosis not present

## 2017-11-20 DIAGNOSIS — R55 Syncope and collapse: Secondary | ICD-10-CM | POA: Insufficient documentation

## 2017-11-20 DIAGNOSIS — I2510 Atherosclerotic heart disease of native coronary artery without angina pectoris: Secondary | ICD-10-CM | POA: Diagnosis not present

## 2017-11-20 DIAGNOSIS — R32 Unspecified urinary incontinence: Secondary | ICD-10-CM | POA: Insufficient documentation

## 2017-11-20 DIAGNOSIS — G5602 Carpal tunnel syndrome, left upper limb: Secondary | ICD-10-CM | POA: Diagnosis not present

## 2017-11-20 DIAGNOSIS — Z79891 Long term (current) use of opiate analgesic: Secondary | ICD-10-CM | POA: Insufficient documentation

## 2017-11-20 DIAGNOSIS — M542 Cervicalgia: Secondary | ICD-10-CM | POA: Diagnosis present

## 2017-11-20 DIAGNOSIS — M797 Fibromyalgia: Secondary | ICD-10-CM | POA: Insufficient documentation

## 2017-11-20 DIAGNOSIS — Z888 Allergy status to other drugs, medicaments and biological substances status: Secondary | ICD-10-CM | POA: Insufficient documentation

## 2017-11-20 DIAGNOSIS — I471 Supraventricular tachycardia: Secondary | ICD-10-CM | POA: Insufficient documentation

## 2017-11-20 DIAGNOSIS — M79602 Pain in left arm: Secondary | ICD-10-CM | POA: Insufficient documentation

## 2017-11-20 DIAGNOSIS — R45851 Suicidal ideations: Secondary | ICD-10-CM | POA: Insufficient documentation

## 2017-11-20 DIAGNOSIS — M79604 Pain in right leg: Secondary | ICD-10-CM | POA: Insufficient documentation

## 2017-11-20 DIAGNOSIS — Z79899 Other long term (current) drug therapy: Secondary | ICD-10-CM | POA: Insufficient documentation

## 2017-11-20 MED ORDER — VITAMIN D3 50 MCG (2000 UT) PO CAPS: 2000.0000 [IU] | ORAL_CAPSULE | Freq: Every day | ORAL | 2 refills | Status: DC

## 2017-11-20 MED ORDER — HYDROCODONE-ACETAMINOPHEN 5-325 MG PO TABS: 1.0000 | ORAL_TABLET | ORAL | 0 refills | Status: DC | PRN

## 2017-11-20 MED ORDER — PREGABALIN 25 MG PO CAPS: 25.0000 mg | ORAL_CAPSULE | Freq: Every day | ORAL | 2 refills | Status: DC

## 2017-11-20 MED ORDER — PREGABALIN 100 MG PO CAPS: 100.0000 mg | ORAL_CAPSULE | Freq: Every day | ORAL | 2 refills | Status: DC

## 2017-11-20 MED ORDER — CYANOCOBALAMIN 2000 MCG PO TABS: 2000.0000 ug | ORAL_TABLET | Freq: Every day | ORAL | 2 refills | Status: DC

## 2017-11-20 NOTE — Progress Notes (Addendum)
Patient's Name: Zoe Sanchez  MRN: [id number]  Referring Provider: Denton Lank, MD  DOB: 1955/12/19  PCP: Denton Lank, MD  DOS: 11/20/2017  Note by: Vevelyn Francois NP  Service setting: Ambulatory outpatient  Specialty: Interventional Pain Management  Location: [facility] (AMB) Pain Management Facility    Patient type: Established    Primary Reason(s) for Visit: Encounter for prescription drug management. (Level of risk: moderate)  CC: Neck Pain and Back Pain (lower)  HPI  Zoe Sanchez is a 62 y.o. year old, female patient, who comes today for a medication management evaluation. She has Microcalcifications of the breast; Encounter for therapeutic drug level monitoring; Long term current use of opiate analgesic; Opiate use (30 MME/Day); Magnesium deficiency; Vitamin B12 deficiency; Myofascial pain syndrome; Cervical facet syndrome (Bilateral) (R>L); Chronic neck pain (Primary Source of Pain) (Bilateral) (R>L); Chronic pain syndrome; Cervical spondylosis; Anxiety; Paroxysmal supraventricular tachycardia ([place]); Syncope and collapse; Fibromyalgia; Cervical herniated disc (C5-6 and C6-7); Cervical foraminal stenosis (Bilateral) (C5-6); Chronic cervical radicular pain (Bilateral) (R>L) (C8 Dermatome); Chronic low back pain [facility] source of pain) (Bilateral) (R>L); Lumbar spondylosis; Coronary atherosclerosis of native coronary artery; Generalized anxiety disorder; Intermittent urinary incontinence; History of psychiatric disorder; Depression; Family history of chronic pain; History of attempted suicide; History of suicidal ideation; Irritable bowel syndrome; Intermittent diarrhea; Chronic constipation; Insomnia secondary to chronic pain; History of hiatal hernia; Long term prescription opiate use; Vitamin D insufficiency; Opioid-induced constipation (OIC); Carpal tunnel syndrome (Left); Claustrophobia; Chronic shoulder pain (Bilateral); Cervical facet arthropathy; Lumbar facet arthropathy; Bimalleolar fracture of  left ankle; Lumbar facet syndrome (Bilateral) (R>L); Chronic upper extremity pain (Secondary source of pain) (Bilateral) (R>L); L4-5 Lumbar Facet joint Synovial cyst; Chronic lower extremity pain (Bilateral) (R>L); and Chronic lumbar radicular pain (Bilateral) (S1) on their problem list. Her primarily concern today is the Neck Pain and Back Pain (lower)  Pain Assessment: Location: Mid Neck Radiating: neck pain radiates up to back of head, lower back pain radiates to buttocks bilateral Onset: More than a month ago Quality: Aching, Headache, Sore(swelling) Severity: 2 /10 (self-reported pain score)  Note: Reported level is compatible with observation.                          Effect on ADL: turning head, looking uop and down, housework Timing: Intermittent Modifying factors: medication, rest  Zoe Sanchez was last scheduled for an appointment on 11/13/2017 for medication management. During today's appointment we reviewed Zoe Sanchez's chronic pain status, as well as her outpatient medication regimen. She feels like the back of her neck will swell with nodules. She states that is feels like it last a few days to a week. She feels like it comes and goes on it own. She has numbness and tingling her in arms. She is not sure it this gets worse. She admits that she does get a headache . She denies any ear pain or congestion with theses symptoms. She states that this has been random.   The patient  reports that she does not use drugs. Her body mass index is 27.99 kg/m.  Further details on both, my assessment(s), as well as the proposed treatment plan, please see below.  Controlled Substance Pharmacotherapy Assessment REMS (Risk Evaluation and Mitigation Strategy)  Analgesic:Hydrocodone/APAP 5/325 one every4 hours (6 per day) (30 mg/day) MME/day:30 mg/day.   [name], RN  11/20/2017  3:07 PM  Signed Nursing Pain Medication Assessment:  Safety precautions to be maintained throughout the  outpatient stay will include: orient to surroundings, keep bed in low position, maintain call bell within reach at all times, provide assistance with transfer out of bed and ambulation.  Medication Inspection Compliance: Pill count conducted under aseptic conditions, in front of the patient. Neither the pills nor the bottle was removed from the patient's sight at any time. Once count was completed pills were immediately returned to the patient in their original bottle.  Medication: See above Pill/Patch Count: 6 of 180 pills remain Pill/Patch Appearance: Markings consistent with prescribed medication Bottle Appearance: Standard pharmacy container. Clearly labeled. Filled Date: 2 / 8 / 2019 Last Medication intake:  Today   Pharmacokinetics: Liberation and absorption (onset of action): WNL Distribution (time to peak effect): WNL Metabolism and excretion (duration of action): WNL         Pharmacodynamics: Desired effects: Analgesia: Zoe Sanchez reports >50% benefit. Functional ability: Patient reports that medication allows her to accomplish basic ADLs Clinically meaningful improvement in function (CMIF): Sustained CMIF goals met Perceived effectiveness: Described as relatively effective, allowing for increase in activities of daily living (ADL) Undesirable effects: Side-effects or Adverse reactions: None reported Monitoring: [place] PMP: Online review of the past 33-monthperiod conducted. Compliant with practice rules and regulations Last UDS on record: No results found for: SUMMARY UDS interpretation: Compliant          Medication Assessment Form: Reviewed. Patient indicates being compliant with therapy Treatment compliance: Compliant Risk Assessment Profile: Aberrant behavior: See prior evaluations. None observed or detected today Comorbid factors increasing risk of overdose: See prior notes. No additional risks detected today Risk of substance use disorder (SUD): Low Opioid Risk Tool -  11/20/17 1420      Family History of Substance Abuse   Alcohol  Negative    Illegal Drugs  Negative    Rx Drugs  Negative      Personal History of Substance Abuse   Alcohol  Negative    Illegal Drugs  Negative    Rx Drugs  Negative      Age   Age between 12-45years   No      History of Preadolescent Sexual Abuse   History of Preadolescent Sexual Abuse  Negative or Female      Psychological Disease   Psychological Disease  Negative    Depression  Negative      Total Score   Opioid Risk Tool Scoring  0    Opioid Risk Interpretation  Low Risk      ORT Scoring interpretation table:  Score <3 = Low Risk for SUD  Score between 4-7 = Moderate Risk for SUD  Score >8 = High Risk for Opioid Abuse   Risk Mitigation Strategies:  Patient Counseling: Covered Patient-Prescriber Agreement (PPA): Present and active  Notification to other healthcare providers: Done  Pharmacologic Plan: No change in therapy, at this time.             Laboratory Chemistry  Inflammation Markers (CRP: Acute Phase) (ESR: Chronic Phase) Lab Results  Component Value Date   ESRSEDRATE 26 09/28/2015                         Rheumatology Markers No results found for: RF, ANA, L[facility]isa [facility]             Renal Function Markers Lab Results  Component Value Date   BUN 11 09/28/2015   CREATININE 0.70 09/28/2015   GFRAA >60 09/28/2015  GFRNONAA >60 09/28/2015                 Hepatic Function Markers Lab Results  Component Value Date   AST 19 09/28/2015   ALT 22 09/28/2015   ALBUMIN 3.5 09/28/2015   ALKPHOS 100 09/28/2015                 Electrolytes Lab Results  Component Value Date   NA 137 09/28/2015   K 4.2 09/28/2015   CL 106 09/28/2015   CALCIUM 8.9 09/28/2015   MG 1.8 03/09/2016                        Neuropathy Markers Lab Results  Component Value Date   KCLEXNTZ00 174 (H) 03/09/2016                 Bone Pathology Markers Lab Results  Component  Value Date   25OHVITD1 49 03/09/2016   25OHVITD2 <1.0 03/09/2016   25OHVITD3 48 03/09/2016                         Coagulation Parameters Lab Results  Component Value Date   INR 0.9 03/06/2012   LABPROT 12.8 03/06/2012   PLT 415 11/09/2012                 Cardiovascular Markers Lab Results  Component Value Date   CKTOTAL 57 11/09/2012   CKMB < 0.5 (L) 11/09/2012   TROPONINI < 0.02 11/09/2012   HGB 12.3 11/09/2012   HCT 36.1 11/09/2012                 CA Markers No results found for: CEA, CA125, LABCA2               Note: Lab results reviewed.  Recent Diagnostic Imaging Results  DG C-Arm 1-60 Min-No Report Fluoroscopy was utilized by the requesting physician.  No radiographic  interpretation.   Complexity Note: Imaging results reviewed. Results shared with Zoe Sanchez, using Layman's terms.                         Meds   Current Outpatient Medications:  .  amitriptyline (ELAVIL) 100 MG tablet, 200 mg at bedtime. , Disp: , Rfl:  .  desonide (DESOWEN) 0.05 % cream, desonide 0.05 % topical cream as needed, Disp: , Rfl:  .  dicyclomine (BENTYL) 20 MG tablet, Take 20 mg by mouth as needed. , Disp: , Rfl:  .  estradiol (ESTRACE) 1 MG tablet, Take 1 mg by mouth daily. , Disp: , Rfl:  .  fluticasone (FLONASE) 50 MCG/ACT nasal spray, Place 2 sprays into both nostrils as needed. , Disp: , Rfl:  .  gemfibrozil (LOPID) 600 MG tablet, Take by mouth 2 (two) times daily before a meal. , Disp: , Rfl:  .  [START ON 12/20/2017] HYDROcodone-acetaminophen (NORCO/VICODIN) 5-325 MG tablet, Take 1 tablet by mouth every 4 (four) hours as needed for severe pain., Disp: 180 tablet, Rfl: 0 .  INCRUSE ELLIPTA 62.5 MCG/INH AEPB, 1 puff as needed. , Disp: , Rfl:  .  Melatonin 10 MG CAPS, Take 20 mg by mouth at bedtime as needed., Disp: 60 capsule, Rfl: 2 .  montelukast (SINGULAIR) 10 MG tablet, Take by mouth at bedtime. , Disp: , Rfl:  .  omeprazole (PRILOSEC) 20 MG capsule, Take 20 mg by mouth 2  (two) times daily before a  meal. , Disp: , Rfl:  .  pregabalin (LYRICA) 100 MG capsule, Take 1 capsule (100 mg total) by mouth at bedtime., Disp: 30 capsule, Rfl: 2 .  pregabalin (LYRICA) 25 MG capsule, Take 1 capsule (25 mg total) by mouth at bedtime., Disp: 30 capsule, Rfl: 2 .  propranolol (INDERAL) 40 MG tablet, TAKE 1 TABLET BY MOUTH 5 TIMES DAILY AS DIRECTED, Disp: , Rfl:  .  propranolol ER (INDERAL LA) 80 MG 24 hr capsule, TAKE 2 CAPSULES BY MOUTH ONCE DAILY, Disp: , Rfl:  .  salmeterol (SEREVENT DISKUS) 50 MCG/DOSE diskus inhaler, Inhale 1 puff into the lungs 2 (two) times daily. , Disp: , Rfl:  .  zolpidem (AMBIEN) 10 MG tablet, 10 mg as needed. , Disp: , Rfl:  .  Cholecalciferol (VITAMIN D3) 2000 units capsule, Take 1 capsule (2,000 Units total) by mouth daily., Disp: 30 capsule, Rfl: 2 .  cyanocobalamin ([employer] VITAMIN B12) 2000 MCG tablet, Take 1 tablet (2,000 mcg total) by mouth daily., Disp: 30 tablet, Rfl: 2 .  [START ON 01/19/2018] HYDROcodone-acetaminophen (NORCO/VICODIN) 5-325 MG tablet, Take 1 tablet by mouth every 4 (four) hours as needed for severe pain., Disp: 180 tablet, Rfl: 0 .  HYDROcodone-acetaminophen (NORCO/VICODIN) 5-325 MG tablet, Take 1 tablet by mouth every 4 (four) hours as needed for severe pain., Disp: 180 tablet, Rfl: 0  ROS  Constitutional: Denies any fever or chills Gastrointestinal: No reported hemesis, hematochezia, vomiting, or acute GI distress Musculoskeletal: Denies any acute onset joint swelling, redness, loss of ROM, or weakness Neurological: No reported episodes of acute onset apraxia, aphasia, dysarthria, agnosia, amnesia, paralysis, loss of coordination, or loss of consciousness  Allergies  Zoe Sanchez is allergic to prednisone and other.  PFSH  Drug: Zoe Sanchez  reports that she does not use drugs. Alcohol:  reports that she does not drink alcohol. Tobacco:  reports that  has never smoked. she has never used smokeless tobacco. Medical:  has a  past medical history of Abnormal heart rhythm, Abnormal mammogram (02/25/2014), Anxiety, Asthma, Awareness of heartbeats (06/29/2015), Breath shortness (06/29/2015), CAD (coronary artery disease), Cancer (HCC), Cervical radicular pain (Location of Secondary source of pain) (Bilateral) (R>L) (C8 Dermatome) (06/29/2015), Chronic pain, COPD (chronic obstructive pulmonary disease) ([place]), Degenerative disk disease, Depression, Endometriosis, Family history of chronic pain (06/29/2015), Fibromyalgia, Fibromyalgia, GERD (gastroesophageal reflux disease), Hiatal hernia, History of attempted suicide (06/29/2015), History of hiatal hernia (06/29/2015), History of psychiatric disorder (06/29/2015), History of suicidal ideation (06/29/2015), Hyperlipidemia, Insomnia, Insomnia, and Irritable bowel syndrome. Surgical: Zoe Sanchez  has a past surgical history that includes Abdominal hysterectomy (1986); Tonsillectomy (1957); Skin lesion excision; Breast surgery; and Breast biopsy (Right, 2016). Family: family history includes Breast cancer in her maternal aunt; Heart disease in her father; Hypertension in her father and mother.  Constitutional Exam  General appearance: alert and cooperative sitting with legs crossed reading a magazine Vitals:   11/20/17 1411  BP: 105/62  Pulse: 71  Resp: 18  Temp: 98 F (36.7 C)  SpO2: 100%  Weight: 158 lb (71.7 kg)  Height: _0  (1.6 m)   BMI Assessment: Estimated body mass index is 27.99 kg/m as calculated from the following:   Height as of this encounter: _1  (1.6 m).   Weight as of this encounter: 158 lb (71.7 kg). Psych/Mental status: Alert, oriented x 3 (person, place, & time)       Eyes: PERLA Respiratory: No evidence of acute respiratory distress  Cervical Spine Area Exam  Skin &  Axial Inspection: No masses, redness, edema, swelling, or associated skin lesions Alignment: Symmetrical Functional ROM: Unrestricted ROM      Stability: No instability  detected Muscle Tone/Strength: Functionally intact. No obvious neuro-muscular anomalies detected. Sensory (Neurological): Unimpaired Palpation: Complains of area being tender to palpation         Upper Extremity (UE) Exam    Side: Right upper extremity  Side: Left upper extremity  Skin & Extremity Inspection: Skin color, temperature, and hair growth are WNL. No peripheral edema or cyanosis. No masses, redness, swelling, asymmetry, or associated skin lesions. No contractures.  Skin & Extremity Inspection: Skin color, temperature, and hair growth are WNL. No peripheral edema or cyanosis. No masses, redness, swelling, asymmetry, or associated skin lesions. No contractures.  Functional ROM: Unrestricted ROM          Functional ROM: Unrestricted ROM          Muscle Tone/Strength: Functionally intact. No obvious neuro-muscular anomalies detected.  Muscle Tone/Strength: Functionally intact. No obvious neuro-muscular anomalies detected.  Sensory (Neurological): Unimpaired          Sensory (Neurological): Unimpaired          Palpation: No palpable anomalies              Palpation: No palpable anomalies              Specialized Test(s): Deferred         Specialized Test(s): Deferred          Thoracic Spine Area Exam  Skin & Axial Inspection: No masses, redness, or swelling Alignment: Symmetrical Functional ROM: Unrestricted ROM Stability: No instability detected Muscle Tone/Strength: Functionally intact. No obvious neuro-muscular anomalies detected. Sensory (Neurological): Unimpaired Muscle strength & Tone: No palpable anomalies  Lumbar Spine Area Exam  Skin & Axial Inspection: No masses, redness, or swelling Alignment: Symmetrical Functional ROM: Unrestricted ROM      Stability: No instability detected Muscle Tone/Strength: Functionally intact. No obvious neuro-muscular anomalies detected. Sensory (Neurological): Unimpaired Palpation: No palpable anomalies       Provocative Tests: Lumbar  Hyperextension and rotation test: evaluation deferred today       Lumbar Lateral bending test: evaluation deferred today       Patrick's Maneuver: evaluation deferred today                    Gait & Posture Assessment  Ambulation: Unassisted Gait: Relatively normal for age and body habitus Posture: WNL   Lower Extremity Exam    Side: Right lower extremity  Side: Left lower extremity  Skin & Extremity Inspection: Skin color, temperature, and hair growth are WNL. No peripheral edema or cyanosis. No masses, redness, swelling, asymmetry, or associated skin lesions. No contractures.  Skin & Extremity Inspection: Skin color, temperature, and hair growth are WNL. No peripheral edema or cyanosis. No masses, redness, swelling, asymmetry, or associated skin lesions. No contractures.  Functional ROM: Unrestricted ROM          Functional ROM: Unrestricted ROM          Muscle Tone/Strength: Functionally intact. No obvious neuro-muscular anomalies detected.  Muscle Tone/Strength: Functionally intact. No obvious neuro-muscular anomalies detected.  Sensory (Neurological): Unimpaired  Sensory (Neurological): Unimpaired  Palpation: No palpable anomalies  Palpation: No palpable anomalies   Assessment  Primary Diagnosis & Pertinent Problem List: The primary encounter diagnosis was Cervical spondylosis. Diagnoses of Lumbar spondylosis, Myofascial pain syndrome, Chronic pain syndrome, Fibromyalgia, Vitamin B12 deficiency, Vitamin D insufficiency, and Long term current  use of opiate analgesic were also pertinent to this visit.  Status Diagnosis  Controlled Controlled Controlled 1. Cervical spondylosis   2. Lumbar spondylosis   3. Myofascial pain syndrome   4. Chronic pain syndrome   5. Fibromyalgia   6. Vitamin B12 deficiency   7. Vitamin D insufficiency   8. Long term current use of opiate analgesic     Problems updated and reviewed during this visit: No problems updated. Plan of Care  Pharmacotherapy  (Medications Ordered): Meds ordered this encounter  Medications  . HYDROcodone-acetaminophen (NORCO/VICODIN) 5-325 MG tablet    Sig: Take 1 tablet by mouth every 4 (four) hours as needed for severe pain.    Dispense:  180 tablet    Refill:  0    Do not place this medication, or any other prescription from our practice, on "Automatic Refill". Patient may have prescription filled one day early if pharmacy is closed on scheduled refill date. Do not fill until:01/19/2018 To last until: 02/18/2018    Order Specific Question:   Supervising Provider    Answer:   Milinda Pointer [phone]  . HYDROcodone-acetaminophen (NORCO/VICODIN) 5-325 MG tablet    Sig: Take 1 tablet by mouth every 4 (four) hours as needed for severe pain.    Dispense:  180 tablet    Refill:  0    Do not place this medication, or any other prescription from our practice, on "Automatic Refill". Patient may have prescription filled one day early if pharmacy is closed on scheduled refill date. Do not fill until:12/20/2017 To last until:01/19/2018    Order Specific Question:   Supervising Provider    Answer:   Milinda Pointer [phone]  . HYDROcodone-acetaminophen (NORCO/VICODIN) 5-325 MG tablet    Sig: Take 1 tablet by mouth every 4 (four) hours as needed for severe pain.    Dispense:  180 tablet    Refill:  0    Do not place this medication, or any other prescription from our practice, on "Automatic Refill". Patient may have prescription filled one day early if pharmacy is closed on scheduled refill date. Do not fill until: 11/20/2017 To last until: 12/20/2017    Order Specific Question:   Supervising Provider    Answer:   Milinda Pointer [phone]  . pregabalin (LYRICA) 25 MG capsule    Sig: Take 1 capsule (25 mg total) by mouth at bedtime.    Dispense:  30 capsule    Refill:  2    Do not place this medication, or any other prescription from our practice, on "Automatic Refill". Patient may have prescription filled one day  early if pharmacy is closed on scheduled refill date.    Order Specific Question:   Supervising Provider    Answer:   Milinda Pointer [phone]  . pregabalin (LYRICA) 100 MG capsule    Sig: Take 1 capsule (100 mg total) by mouth at bedtime.    Dispense:  30 capsule    Refill:  2    Do not place this medication, or any other prescription from our practice, on "Automatic Refill". Patient may have prescription filled one day early if pharmacy is closed on scheduled refill date.    Order Specific Question:   Supervising Provider    Answer:   Milinda Pointer [phone]  . cyanocobalamin ([employer]S VITAMIN B12) 2000 MCG tablet    Sig: Take 1 tablet (2,000 mcg total) by mouth daily.    Dispense:  30 tablet    Refill:  2  Do not place this medication, or any other prescription from our practice, on "Automatic Refill".    Order Specific Question:   Supervising Provider    Answer:   Milinda Pointer [phone]  . Cholecalciferol (VITAMIN D3) 2000 units capsule    Sig: Take 1 capsule (2,000 Units total) by mouth daily.    Dispense:  30 capsule    Refill:  2    Do not place this medication, or any other prescription from our practice, on "Automatic Refill".    Order Specific Question:   Supervising Provider    Answer:   Milinda Pointer [[id number]]   New Prescriptions   No medications on file   Medications administered today: Zoe Sanchez had no medications administered during this visit. Lab-work, procedure(s), and/or referral(s): Orders Placed This Encounter  Procedures  . ToxASSURE Select 13 (MW), Urine   Imaging and/or referral(s): None  nterventional therapies: Planned, scheduled, and/or pending: Palliativebilateral cervical facetblock  Pt to call  May consider Occipital nerve block if not effective.    Considering:  Diagnostic bilateral cervical facetblock  Possible bilateral cervical facet radiofrequencyablation  Diagnostic right-sided cervical epiduralsteroid  injection  Diagnostic bilateral intra-articular shoulder jointinjection  Diagnostic bilateral suprascapular nerveblock  Possible bilateral suprascapular nerve radiofrequencyablation  Diagnostic bilateral lumbar facetblock  Possible bilateral lumbar facet radiofrequencyablation  Diagnostic left sided carpal tunnel injection   Palliative PRN treatment(s):  Palliativebilateral cervical facetblock  Palliativeright-sided cervical epiduralsteroid injection  Palliativebilateral intra-articular shoulderjoint injection Palliativebilateral suprascapular nerveblock  Palliativebilateral lumbar facet block  Palliativeleft sided L4-5 lumbar epidural steroid injection  Palliativeleft sided carpal tunnelinjection    Provider-requested follow-up: Return in about 3 months (around 02/14/2018) for MedMgmt with Me Dionisio David), in addition, (PRN), w/ Dr. Dossie Arbour, Procedure(w/Sedation).  Future Appointments  Date Time Provider [place]  12/07/2017 10:20 AM [facility]  12/07/2017 11:00 AM [facility]  02/14/2018 12:45 PM Vevelyn Francois, NP [facility] None   Primary Care Physician: Denton Lank, MD Location: [facility] Outpatient [facility] Management Facility Note by: Vevelyn Francois NP Date: 11/20/2017; Time: 2:57 PM  Pain Score Disclaimer: We use the NRS-11 scale. This is a self-reported, subjective measurement of pain severity with only modest accuracy. It is used primarily to identify changes within a particular patient. It must be understood that outpatient pain scales are significantly less accurate that those used for research, where they can be applied under ideal controlled circumstances with minimal exposure to variables. In reality, the score is likely to be a combination of pain intensity and pain affect, where pain affect describes the degree of emotional arousal or changes in action readiness caused by the sensory experience of pain. Factors such as  social and work situation, setting, emotional state, anxiety levels, expectation, and prior pain experience may influence pain perception and show large inter-individual differences that may also be affected by time variables.  Patient instructions provided during this appointment: Patient Instructions  ____________________________________________________________________________________________  Medication Rules  Applies to: All patients receiving prescriptions (written or electronic).  Pharmacy of record: Pharmacy where electronic prescriptions will be sent. If written prescriptions are taken to a different pharmacy, please inform the nursing staff. The pharmacy listed in the electronic medical record should be the one where you would like electronic prescriptions to be sent.  Prescription refills: Only during scheduled appointments. Applies to both, written and electronic prescriptions.  NOTE: The following applies primarily to controlled substances (Opioid* Pain Medications).   Patient's responsibilities: 1. Pain Pills: Bring all  pain pills to every appointment (except for procedure appointments). 2. Pill Bottles: Bring pills in original pharmacy bottle. Always bring newest bottle. Bring bottle, even if empty. 3. Medication refills: You are responsible for knowing and keeping track of what medications you need refilled. The day before your appointment, write a list of all prescriptions that need to be refilled. Bring that list to your appointment and give it to the admitting nurse. Prescriptions will be written only during appointments. If you forget a medication, it will not be "Called in", "Faxed", or "electronically sent". You will need to get another appointment to get these prescribed. 4. Prescription Accuracy: You are responsible for carefully inspecting your prescriptions before leaving our office. Have the discharge nurse carefully go over each prescription with you, before taking them  home. Make sure that your name is accurately spelled, that your address is correct. Check the name and dose of your medication to make sure it is accurate. Check the number of pills, and the written instructions to make sure they are clear and accurate. Make sure that you are given enough medication to last until your next medication refill appointment. 5. Taking Medication: Take medication as prescribed. Never take more pills than instructed. Never take medication more frequently than prescribed. Taking less pills or less frequently is permitted and encouraged, when it comes to controlled substances (written prescriptions).  6. Inform other Doctors: Always inform, all of your healthcare providers, of all the medications you take. 7. Pain Medication from other Providers: You are not allowed to accept any additional pain medication from any other Doctor or Healthcare provider. There are two exceptions to this rule. (see below) In the event that you require additional pain medication, you are responsible for notifying us, as stated below. 8. Medication Agreement: You are responsible for carefully reading and following our Medication Agreement. This must be signed before receiving any prescriptions from our practice. Safely store a copy of your signed Agreement. Violations to the Agreement will result in no further prescriptions. (Additional copies of our Medication Agreement are available upon request.) 9. Laws, Rules, & Regulations: All patients are expected to follow all Federal and [employer], [employer], Rules, [employer]. Ignorance of the Laws does not constitute a valid excuse. The use of any illegal substances is prohibited. 10. Adopted CDC guidelines & recommendations: Target dosing levels will be at or below 60 MME/day. Use of benzodiazepines** is not recommended.  Exceptions: There are only two exceptions to the rule of not receiving pain medications from other Healthcare Providers. 1. Exception #1  (Emergencies): In the event of an emergency (i.e.: accident requiring emergency care), you are allowed to receive additional pain medication. However, you are responsible for: As soon as you are able, call our office [phone], at any time of the day or night, and leave a message stating your name, the date and nature of the emergency, and the name and dose of the medication prescribed. In the event that your call is answered by a member of our staff, make sure to document and save the date, time, and the name of the person that took your information.  2. Exception #2 (Planned Surgery): In the event that you are scheduled by another doctor or dentist to have any type of surgery or procedure, you are allowed (for a period no longer than 30 days), to receive additional pain medication, for the acute post-op pain. However, in this case, you are responsible for picking up a copy of our "  Post-op Pain Management for Surgeons" handout, and giving it to your surgeon or dentist. This document is available at our office, and does not require an appointment to obtain it. Simply go to our office during business hours (Monday-Thursday from 8:00 AM to 4:00 PM) (Friday 8:00 AM to 12:00 Noon) or if you have a scheduled appointment with [place], prior to your surgery, and ask for it by name. In addition, you will need to provide [place] with your name, name of your surgeon, type of surgery, and date of procedure or surgery.  *Opioid medications include: morphine, codeine, oxycodone, oxymorphone, hydrocodone, hydromorphone, meperidine, tramadol, tapentadol, buprenorphine, fentanyl, methadone. **Benzodiazepine medications include: diazepam (Valium), alprazolam (Xanax), clonazepam (Klonopine), lorazepam (Ativan), clorazepate (Tranxene), chlordiazepoxide (Librium), estazolam (Prosom), oxazepam (Serax), temazepam (Restoril), triazolam (Halcion) (Last updated:  11/09/2017) ____________________________________________________________________________________________

## 2017-11-20 NOTE — Patient Instructions (Signed)
____________________________________________________________________________________________  Medication Rules  Applies to: All patients receiving prescriptions (written or electronic).  Pharmacy of record: Pharmacy where electronic prescriptions will be sent. If written prescriptions are taken to a different pharmacy, please inform the nursing staff. The pharmacy listed in the electronic medical record should be the one where you would like electronic prescriptions to be sent.  Prescription refills: Only during scheduled appointments. Applies to both, written and electronic prescriptions.  NOTE: The following applies primarily to controlled substances (Opioid* Pain Medications).   Patient's responsibilities: 1. Pain Pills: Bring all pain pills to every appointment (except for procedure appointments). 2. Pill Bottles: Bring pills in original pharmacy bottle. Always bring newest bottle. Bring bottle, even if empty. 3. Medication refills: You are responsible for knowing and keeping track of what medications you need refilled. The day before your appointment, write a list of all prescriptions that need to be refilled. Bring that list to your appointment and give it to the admitting nurse. Prescriptions will be written only during appointments. If you forget a medication, it will not be "Called in", "Faxed", or "electronically sent". You will need to get another appointment to get these prescribed. 4. Prescription Accuracy: You are responsible for carefully inspecting your prescriptions before leaving our office. Have the discharge nurse carefully go over each prescription with you, before taking them home. Make sure that your name is accurately spelled, that your address is correct. Check the name and dose of your medication to make sure it is accurate. Check the number of pills, and the written instructions to make sure they are clear and accurate. Make sure that you are given enough medication to last  until your next medication refill appointment. 5. Taking Medication: Take medication as prescribed. Never take more pills than instructed. Never take medication more frequently than prescribed. Taking less pills or less frequently is permitted and encouraged, when it comes to controlled substances (written prescriptions).  6. Inform other Doctors: Always inform, all of your healthcare providers, of all the medications you take. 7. Pain Medication from other Providers: You are not allowed to accept any additional pain medication from any other Doctor or Healthcare provider. There are two exceptions to this rule. (see below) In the event that you require additional pain medication, you are responsible for notifying us, as stated below. 8. Medication Agreement: You are responsible for carefully reading and following our Medication Agreement. This must be signed before receiving any prescriptions from our practice. Safely store a copy of your signed Agreement. Violations to the Agreement will result in no further prescriptions. (Additional copies of our Medication Agreement are available upon request.) 9. Laws, Rules, & Regulations: All patients are expected to follow all Federal and State Laws, Statutes, Rules, & Regulations. Ignorance of the Laws does not constitute a valid excuse. The use of any illegal substances is prohibited. 10. Adopted CDC guidelines & recommendations: Target dosing levels will be at or below 60 MME/day. Use of benzodiazepines** is not recommended.  Exceptions: There are only two exceptions to the rule of not receiving pain medications from other Healthcare Providers. 1. Exception #1 (Emergencies): In the event of an emergency (i.e.: accident requiring emergency care), you are allowed to receive additional pain medication. However, you are responsible for: As soon as you are able, call our office [phone], at any time of the day or night, and leave a message stating your name, the  date and nature of the emergency, and the name and dose of the medication   prescribed. In the event that your call is answered by a member of our staff, make sure to document and save the date, time, and the name of the person that took your information.  2. Exception #2 (Planned Surgery): In the event that you are scheduled by another doctor or dentist to have any type of surgery or procedure, you are allowed (for a period no longer than 30 days), to receive additional pain medication, for the acute post-op pain. However, in this case, you are responsible for picking up a copy of our "Post-op Pain Management for Surgeons" handout, and giving it to your surgeon or dentist. This document is available at our office, and does not require an appointment to obtain it. Simply go to our office during business hours (Monday-Thursday from 8:00 AM to 4:00 PM) (Friday 8:00 AM to 12:00 Noon) or if you have a scheduled appointment with us, prior to your surgery, and ask for it by name. In addition, you will need to provide us with your name, name of your surgeon, type of surgery, and date of procedure or surgery.  *Opioid medications include: morphine, codeine, oxycodone, oxymorphone, hydrocodone, hydromorphone, meperidine, tramadol, tapentadol, buprenorphine, fentanyl, methadone. **Benzodiazepine medications include: diazepam (Valium), alprazolam (Xanax), clonazepam (Klonopine), lorazepam (Ativan), clorazepate (Tranxene), chlordiazepoxide (Librium), estazolam (Prosom), oxazepam (Serax), temazepam (Restoril), triazolam (Halcion) (Last updated: 11/09/2017) ____________________________________________________________________________________________    

## 2017-11-20 NOTE — Progress Notes (Signed)
Nursing Pain Medication Assessment:  Safety precautions to be maintained throughout the outpatient stay will include: orient to surroundings, keep bed in low position, maintain call bell within reach at all times, provide assistance with transfer out of bed and ambulation.  Medication Inspection Compliance: Pill count conducted under aseptic conditions, in front of the patient. Neither the pills nor the bottle was removed from the patient's sight at any time. Once count was completed pills were immediately returned to the patient in their original bottle.  Medication: See above Pill/Patch Count: 6 of 180 pills remain Pill/Patch Appearance: Markings consistent with prescribed medication Bottle Appearance: Standard pharmacy container. Clearly labeled. Filled Date: 2 / 8 / 2019 Last Medication intake:  Today

## 2017-11-21 ENCOUNTER — Other Ambulatory Visit: Payer: Self-pay | Admitting: Nurse Practitioner

## 2017-11-21 ENCOUNTER — Telehealth: Payer: Self-pay | Admitting: Nurse Practitioner

## 2017-11-21 MED ORDER — CYCLOBENZAPRINE HCL 5 MG PO TABS: 5.0000 mg | ORAL_TABLET | Freq: Four times a day (QID) | ORAL | 2 refills | Status: DC

## 2017-11-21 NOTE — Telephone Encounter (Signed)
Crystal, will you sent this in? Last written on 06-21-17. She did not fill it until 08-17-17.

## 2017-11-21 NOTE — Telephone Encounter (Signed)
sent 

## 2017-11-21 NOTE — Telephone Encounter (Signed)
Did not get med called in (Flexiril) would like to get this called in today if possible

## 2017-11-24 LAB — TOXASSURE SELECT 13 (MW), URINE

## 2017-12-07 ENCOUNTER — Other Ambulatory Visit: Payer: Medicare Other

## 2018-01-02 ENCOUNTER — Other Ambulatory Visit: Payer: Medicare Other

## 2018-01-16 ENCOUNTER — Other Ambulatory Visit: Payer: Self-pay | Admitting: Nurse Practitioner

## 2018-01-16 ENCOUNTER — Telehealth: Payer: Self-pay | Admitting: Nurse Practitioner

## 2018-01-16 DIAGNOSIS — M797 Fibromyalgia: Secondary | ICD-10-CM

## 2018-01-16 MED ORDER — PREGABALIN 25 MG PO CAPS: 25.0000 mg | ORAL_CAPSULE | Freq: Two times a day (BID) | ORAL | 0 refills | Status: DC

## 2018-01-16 NOTE — Telephone Encounter (Signed)
Currently takes Lyrica 125 mg at bedtime. Would like to add 25mg  to be taken around 11a.m.  Asking for this to be called in.

## 2018-01-16 NOTE — Telephone Encounter (Signed)
Would like to speak with Crystal about current meds, please call

## 2018-01-16 NOTE — Telephone Encounter (Signed)
Do not have placed the order in the computer from Lyrica 25 mg 1 tablet by mouth twice a day for 30 days no refill

## 2018-01-16 NOTE — Telephone Encounter (Signed)
Script called in. Patient notified.

## 2018-02-14 ENCOUNTER — Ambulatory Visit: Payer: Medicare Other | Attending: Nurse Practitioner | Admitting: Nurse Practitioner

## 2018-02-14 ENCOUNTER — Encounter: Payer: Self-pay | Admitting: Nurse Practitioner

## 2018-02-14 VITALS — BP 121/69 | HR 69 | Temp 97.7°F | Resp 16 | Ht 63.0 in | Wt 158.0 lb

## 2018-02-14 DIAGNOSIS — F40240 Claustrophobia: Secondary | ICD-10-CM | POA: Diagnosis not present

## 2018-02-14 DIAGNOSIS — E559 Vitamin D deficiency, unspecified: Secondary | ICD-10-CM | POA: Diagnosis not present

## 2018-02-14 DIAGNOSIS — M79604 Pain in right leg: Secondary | ICD-10-CM | POA: Diagnosis not present

## 2018-02-14 DIAGNOSIS — I471 Supraventricular tachycardia: Secondary | ICD-10-CM | POA: Diagnosis not present

## 2018-02-14 DIAGNOSIS — M542 Cervicalgia: Secondary | ICD-10-CM | POA: Diagnosis present

## 2018-02-14 DIAGNOSIS — Z79891 Long term (current) use of opiate analgesic: Secondary | ICD-10-CM | POA: Diagnosis not present

## 2018-02-14 DIAGNOSIS — F329 Major depressive disorder, single episode, unspecified: Secondary | ICD-10-CM | POA: Diagnosis not present

## 2018-02-14 DIAGNOSIS — M47812 Spondylosis without myelopathy or radiculopathy, cervical region: Secondary | ICD-10-CM | POA: Diagnosis not present

## 2018-02-14 DIAGNOSIS — S82842A Displaced bimalleolar fracture of left lower leg, initial encounter for closed fracture: Secondary | ICD-10-CM | POA: Diagnosis not present

## 2018-02-14 DIAGNOSIS — M545 Low back pain: Secondary | ICD-10-CM | POA: Insufficient documentation

## 2018-02-14 DIAGNOSIS — G894 Chronic pain syndrome: Secondary | ICD-10-CM | POA: Diagnosis not present

## 2018-02-14 DIAGNOSIS — Z79899 Other long term (current) drug therapy: Secondary | ICD-10-CM | POA: Insufficient documentation

## 2018-02-14 DIAGNOSIS — K581 Irritable bowel syndrome with constipation: Secondary | ICD-10-CM | POA: Insufficient documentation

## 2018-02-14 DIAGNOSIS — I2510 Atherosclerotic heart disease of native coronary artery without angina pectoris: Secondary | ICD-10-CM | POA: Diagnosis not present

## 2018-02-14 DIAGNOSIS — F411 Generalized anxiety disorder: Secondary | ICD-10-CM | POA: Diagnosis not present

## 2018-02-14 DIAGNOSIS — E538 Deficiency of other specified B group vitamins: Secondary | ICD-10-CM

## 2018-02-14 DIAGNOSIS — M79605 Pain in left leg: Secondary | ICD-10-CM | POA: Insufficient documentation

## 2018-02-14 DIAGNOSIS — M79601 Pain in right arm: Secondary | ICD-10-CM | POA: Insufficient documentation

## 2018-02-14 DIAGNOSIS — Z888 Allergy status to other drugs, medicaments and biological substances status: Secondary | ICD-10-CM | POA: Insufficient documentation

## 2018-02-14 DIAGNOSIS — M79602 Pain in left arm: Secondary | ICD-10-CM | POA: Insufficient documentation

## 2018-02-14 DIAGNOSIS — M7918 Myalgia, other site: Secondary | ICD-10-CM | POA: Diagnosis not present

## 2018-02-14 DIAGNOSIS — M4802 Spinal stenosis, cervical region: Secondary | ICD-10-CM | POA: Diagnosis not present

## 2018-02-14 DIAGNOSIS — R55 Syncope and collapse: Secondary | ICD-10-CM | POA: Diagnosis not present

## 2018-02-14 DIAGNOSIS — M47816 Spondylosis without myelopathy or radiculopathy, lumbar region: Secondary | ICD-10-CM | POA: Diagnosis not present

## 2018-02-14 DIAGNOSIS — M50123 Cervical disc disorder at C6-C7 level with radiculopathy: Secondary | ICD-10-CM | POA: Insufficient documentation

## 2018-02-14 DIAGNOSIS — G5602 Carpal tunnel syndrome, left upper limb: Secondary | ICD-10-CM | POA: Insufficient documentation

## 2018-02-14 DIAGNOSIS — M797 Fibromyalgia: Secondary | ICD-10-CM | POA: Diagnosis not present

## 2018-02-14 DIAGNOSIS — F99 Mental disorder, not otherwise specified: Secondary | ICD-10-CM | POA: Insufficient documentation

## 2018-02-14 MED ORDER — PREGABALIN 100 MG PO CAPS: 100.0000 mg | ORAL_CAPSULE | Freq: Every day | ORAL | 2 refills | Status: DC

## 2018-02-14 MED ORDER — PREGABALIN 25 MG PO CAPS
25.0000 mg | ORAL_CAPSULE | Freq: Two times a day (BID) | ORAL | 2 refills | Status: DC
Start: 2018-02-14 — End: 2018-05-10

## 2018-02-14 MED ORDER — HYDROCODONE-ACETAMINOPHEN 5-325 MG PO TABS: 1.0000 | ORAL_TABLET | ORAL | 0 refills | Status: DC | PRN

## 2018-02-14 MED ORDER — PREGABALIN 25 MG PO CAPS: 25.0000 mg | ORAL_CAPSULE | Freq: Two times a day (BID) | ORAL | 0 refills | Status: DC

## 2018-02-14 MED ORDER — CYANOCOBALAMIN 2000 MCG PO TABS: 2000.0000 ug | ORAL_TABLET | Freq: Every day | ORAL | 2 refills | Status: DC

## 2018-02-14 MED ORDER — CYCLOBENZAPRINE HCL 5 MG PO TABS: 5.0000 mg | ORAL_TABLET | Freq: Four times a day (QID) | ORAL | 2 refills | Status: DC

## 2018-02-14 MED ORDER — VITAMIN D3 50 MCG (2000 UT) PO CAPS: 2000.0000 [IU] | ORAL_CAPSULE | Freq: Every day | ORAL | 2 refills | Status: DC

## 2018-02-14 NOTE — Patient Instructions (Signed)
____________________________________________________________________________________________  Medication Rules  Applies to: All patients receiving prescriptions (written or electronic).  Pharmacy of record: Pharmacy where electronic prescriptions will be sent. If written prescriptions are taken to a different pharmacy, please inform the nursing staff. The pharmacy listed in the electronic medical record should be the one where you would like electronic prescriptions to be sent.  Prescription refills: Only during scheduled appointments. Applies to both, written and electronic prescriptions.  NOTE: The following applies primarily to controlled substances (Opioid* Pain Medications).   Patient's responsibilities: 1. Pain Pills: Bring all pain pills to every appointment (except for procedure appointments). 2. Pill Bottles: Bring pills in original pharmacy bottle. Always bring newest bottle. Bring bottle, even if empty. 3. Medication refills: You are responsible for knowing and keeping track of what medications you need refilled. The day before your appointment, write a list of all prescriptions that need to be refilled. Bring that list to your appointment and give it to the admitting nurse. Prescriptions will be written only during appointments. If you forget a medication, it will not be "Called in", "Faxed", or "electronically sent". You will need to get another appointment to get these prescribed. 4. Prescription Accuracy: You are responsible for carefully inspecting your prescriptions before leaving our office. Have the discharge nurse carefully go over each prescription with you, before taking them home. Make sure that your name is accurately spelled, that your address is correct. Check the name and dose of your medication to make sure it is accurate. Check the number of pills, and the written instructions to make sure they are clear and accurate. Make sure that you are given enough medication to last  until your next medication refill appointment. 5. Taking Medication: Take medication as prescribed. Never take more pills than instructed. Never take medication more frequently than prescribed. Taking less pills or less frequently is permitted and encouraged, when it comes to controlled substances (written prescriptions).  6. Inform other Doctors: Always inform, all of your healthcare providers, of all the medications you take. 7. Pain Medication from other Providers: You are not allowed to accept any additional pain medication from any other Doctor or Healthcare provider. There are two exceptions to this rule. (see below) In the event that you require additional pain medication, you are responsible for notifying us, as stated below. 8. Medication Agreement: You are responsible for carefully reading and following our Medication Agreement. This must be signed before receiving any prescriptions from our practice. Safely store a copy of your signed Agreement. Violations to the Agreement will result in no further prescriptions. (Additional copies of our Medication Agreement are available upon request.) 9. Laws, Rules, & Regulations: All patients are expected to follow all Federal and State Laws, Statutes, Rules, & Regulations. Ignorance of the Laws does not constitute a valid excuse. The use of any illegal substances is prohibited. 10. Adopted CDC guidelines & recommendations: Target dosing levels will be at or below 60 MME/day. Use of benzodiazepines** is not recommended.  Exceptions: There are only two exceptions to the rule of not receiving pain medications from other Healthcare Providers. 1. Exception #1 (Emergencies): In the event of an emergency (i.e.: accident requiring emergency care), you are allowed to receive additional pain medication. However, you are responsible for: As soon as you are able, call our office [phone], at any time of the day or night, and leave a message stating your name, the  date and nature of the emergency, and the name and dose of the medication   prescribed. In the event that your call is answered by a member of our staff, make sure to document and save the date, time, and the name of the person that took your information.  2. Exception #2 (Planned Surgery): In the event that you are scheduled by another doctor or dentist to have any type of surgery or procedure, you are allowed (for a period no longer than 30 days), to receive additional pain medication, for the acute post-op pain. However, in this case, you are responsible for picking up a copy of our "Post-op Pain Management for Surgeons" handout, and giving it to your surgeon or dentist. This document is available at our office, and does not require an appointment to obtain it. Simply go to our office during business hours (Monday-Thursday from 8:00 AM to 4:00 PM) (Friday 8:00 AM to 12:00 Noon) or if you have a scheduled appointment with us, prior to your surgery, and ask for it by name. In addition, you will need to provide us with your name, name of your surgeon, type of surgery, and date of procedure or surgery.  *Opioid medications include: morphine, codeine, oxycodone, oxymorphone, hydrocodone, hydromorphone, meperidine, tramadol, tapentadol, buprenorphine, fentanyl, methadone. **Benzodiazepine medications include: diazepam (Valium), alprazolam (Xanax), clonazepam (Klonopine), lorazepam (Ativan), clorazepate (Tranxene), chlordiazepoxide (Librium), estazolam (Prosom), oxazepam (Serax), temazepam (Restoril), triazolam (Halcion) (Last updated: 11/09/2017) ____________________________________________________________________________________________    

## 2018-02-14 NOTE — Progress Notes (Signed)
Nursing Pain Medication Assessment:  Safety precautions to be maintained throughout the outpatient stay will include: orient to surroundings, keep bed in low position, maintain call bell within reach at all times, provide assistance with transfer out of bed and ambulation.  Medication Inspection Compliance: Pill count conducted under aseptic conditions, in front of the patient. Neither the pills nor the bottle was removed from the patient's sight at any time. Once count was completed pills were immediately returned to the patient in their original bottle.  Medication: Hydrocodone/APAP Pill/Patch Count: 21 of 180 pills remain Pill/Patch Appearance: Markings consistent with prescribed medication Bottle Appearance: Standard pharmacy container. Clearly labeled. Filled Date: 05 / 09 / 2019 Last Medication intake:  Today

## 2018-02-14 NOTE — Progress Notes (Signed)
Patient's Name: Zoe Sanchez  MRN: [id number]  Referring Provider: Denton Lank, MD  DOB: [birth year]  PCP: Denton Lank, MD  DOS: 02/14/2018  Note by: Vevelyn Francois NP  Service setting: Ambulatory outpatient  Specialty: Interventional Pain Management  Location: [facility] (AMB) Pain Management Facility    Patient type: Established    Primary Reason(s) for Visit: Encounter for prescription drug management. (Level of risk: moderate)  CC: Neck Pain (right) and Back Pain (lower bilateral)  HPI  Zoe Sanchez is a 62 y.o. year old, female patient, who comes today for a medication management evaluation. She has Microcalcifications of the breast; Encounter for therapeutic drug level monitoring; Long term current use of opiate analgesic; Opiate use (30 MME/Day); Magnesium deficiency; Vitamin B12 deficiency; Myofascial pain syndrome; Cervical facet syndrome (Bilateral) (R>L); Chronic neck pain (Primary Source of Pain) (Bilateral) (R>L); Chronic pain syndrome; Cervical spondylosis; Anxiety; Paroxysmal supraventricular tachycardia ([place]); Syncope and collapse; Fibromyalgia; Cervical herniated disc (C5-6 and C6-7); Cervical foraminal stenosis (Bilateral) (C5-6); Chronic cervical radicular pain (Bilateral) (R>L) (C8 Dermatome); Chronic low back pain [facility] source of pain) (Bilateral) (R>L); Lumbar spondylosis; Coronary atherosclerosis of native coronary artery; Generalized anxiety disorder; Intermittent urinary incontinence; History of psychiatric disorder; Depression; Family history of chronic pain; History of attempted suicide; History of suicidal ideation; Irritable bowel syndrome; Intermittent diarrhea; Chronic constipation; Insomnia secondary to chronic pain; History of hiatal hernia; Long term prescription opiate use; Vitamin D insufficiency; Opioid-induced constipation (OIC); Carpal tunnel syndrome (Left); Claustrophobia; Chronic shoulder pain (Bilateral); Cervical facet arthropathy; Lumbar facet arthropathy;  Bimalleolar fracture of left ankle; Lumbar facet syndrome (Bilateral) (R>L); Chronic upper extremity pain (Secondary source of pain) (Bilateral) (R>L); L4-5 Lumbar Facet joint Synovial cyst; Chronic lower extremity pain (Bilateral) (R>L); and Chronic lumbar radicular pain (Bilateral) (S1) on their problem list. Her primarily concern today is the Neck Pain (right) and Back Pain (lower bilateral)  Pain Assessment: Location: Lower, Left, Right(neck on the right) Back Radiating: some radiating down into the hips and legs, c/o stabbing shooting pains into the feet and right neck pain is going into right shoulder Onset: More than a month ago Duration: Chronic pain Quality: Stabbing, Shooting, Discomfort, Aching, Constant(difficult to control) Severity: 3 /10 (subjective, self-reported pain score)  Note: Reported level is compatible with observation.                          Effect on ADL: difficult to get chores done, takes a very long time because of having to take breaks.  unable to do anything at times.  Timing: Constant Modifying factors: medications and laying down BP: 121/69  HR: 69  Zoe Sanchez was last scheduled for an appointment on 01/16/2018 for medication management. During today's appointment we reviewed Zoe Sanchez's chronic pain status, as well as her outpatient medication regimen. She admits that she has been cleaning more and admits that this has got her down. She admits that she did ask for the increase of Lyrica. She admits that it has been effective. She admits that it has helped her rest. She admits that the pain is bad, she just stops what she is doing and lies down.   The patient  reports that she does not use drugs. Her body mass index is 27.99 kg/m.  Further details on both, my assessment(s), as well as the proposed treatment plan, please see below.  Controlled Substance Pharmacotherapy Assessment REMS (Risk Evaluation and Mitigation Strategy)  Analgesic:Hydrocodone/APAP  5/325 one every4 hours (6  per day) (30 mg/day) MME/day:30 mg/day.   [name], RN  02/14/2018  1:04 PM  Sign at close encounter Nursing Pain Medication Assessment:  Safety precautions to be maintained throughout the outpatient stay will include: orient to surroundings, keep bed in low position, maintain call bell within reach at all times, provide assistance with transfer out of bed and ambulation.  Medication Inspection Compliance: Pill count conducted under aseptic conditions, in front of the patient. Neither the pills nor the bottle was removed from the patient's sight at any time. Once count was completed pills were immediately returned to the patient in their original bottle.  Medication: Hydrocodone/APAP Pill/Patch Count: 21 of 180 pills remain Pill/Patch Appearance: Markings consistent with prescribed medication Bottle Appearance: Standard pharmacy container. Clearly labeled. Filled Date: 05 / 09 / 2019 Last Medication intake:  Today   Pharmacokinetics: Liberation and absorption (onset of action): WNL Distribution (time to peak effect): WNL Metabolism and excretion (duration of action): WNL         Pharmacodynamics: Desired effects: Analgesia: Zoe Sanchez reports >50% benefit. Functional ability: Patient reports that medication allows her to accomplish basic ADLs Clinically meaningful improvement in function (CMIF): Sustained CMIF goals met Perceived effectiveness: Described as relatively effective, allowing for increase in activities of daily living (ADL) Undesirable effects: Side-effects or Adverse reactions: None reported Monitoring: [place] PMP: Online review of the past 54-monthperiod conducted. Compliant with practice rules and regulations Last UDS on record: Summary  Date Value Ref Range Status  11/20/2017 FINAL  Final    Comment:    ==================================================================== TOXASSURE SELECT 13  (MW) ==================================================================== Test                             Result       Flag       Units Drug Present and Declared for Prescription Verification   Hydrocodone                    3143         EXPECTED   ng/mg creat   Hydromorphone                  328          EXPECTED   ng/mg creat   Dihydrocodeine                 834          EXPECTED   ng/mg creat   Norhydrocodone                 1868         EXPECTED   ng/mg creat    Sources of hydrocodone include scheduled prescription    medications. Hydromorphone, dihydrocodeine and norhydrocodone are    expected metabolites of hydrocodone. Hydromorphone and    dihydrocodeine are also available as scheduled prescription    medications. ==================================================================== Test                      Result    Flag   Units      Ref Range   Creatinine              47               mg/dL      >=20 ==================================================================== Declared Medications:  The flagging and interpretation on this report are based on the  following declared  medications.  Unexpected results may arise from  inaccuracies in the declared medications.  **Note: The testing scope of this panel includes these medications:  Hydrocodone (Norco)  **Note: The testing scope of this panel does not include following  reported medications:  Acetaminophen (Norco)  Amitriptyline  Cholecalciferol  Cyanocobalamin  Desonide  Dicyclomine  Estradiol  Fluticasone  Gemfibrozil (Lopid)  Melatonin  Montelukast (Singulair)  Omeprazole (Prilosec)  Pregabalin (Lyrica)  Propranolol (Inderal)  Salmeterol (Serevent)  Umeclidinium (Incruse Ellipta)  Zolpidem (Ambien) ==================================================================== For clinical consultation, please call [phone]. ====================================================================    UDS  interpretation: Compliant          Medication Assessment Form: Reviewed. Patient indicates being compliant with therapy Treatment compliance: Compliant Risk Assessment Profile: Aberrant behavior: See prior evaluations. None observed or detected today Comorbid factors increasing risk of overdose: See prior notes. No additional risks detected today Risk of substance use disorder (SUD): Low Opioid Risk Tool - 02/14/18 1302      Family History of Substance Abuse   Alcohol  Negative    Illegal Drugs  Negative    Rx Drugs  Negative      Personal History of Substance Abuse   Alcohol  Negative    Illegal Drugs  Negative    Rx Drugs  Negative      Psychological Disease   Psychological Disease  Positive    ADD  Negative    OCD  Negative    Bipolar  Negative    Schizophrenia  Negative    Depression  Positive patient taking elavil and reports that it is mostly helping    patient taking elavil and reports that it is mostly helping      Total Score   Opioid Risk Tool Scoring  3    Opioid Risk Interpretation  Low Risk      ORT Scoring interpretation table:  Score <3 = Low Risk for SUD  Score between 4-7 = Moderate Risk for SUD  Score >8 = High Risk for Opioid Abuse   Risk Mitigation Strategies:  Patient Counseling: Covered Patient-Prescriber Agreement (PPA): Present and active  Notification to other healthcare providers: Done  Pharmacologic Plan: No change in therapy, at this time.             Laboratory Chemistry  Inflammation Markers (CRP: Acute Phase) (ESR: Chronic Phase) Lab Results  Component Value Date   ESRSEDRATE 26 09/28/2015                         Rheumatology Markers No results found for: RF, ANA, LABURIC, URICUR, LYMEIGGIGMAB, LYMEABIGMQN, HLAB27                      Renal Function Markers Lab Results  Component Value Date   BUN 11 09/28/2015   CREATININE 0.70 09/28/2015   GFRAA >60 09/28/2015   GFRNONAA >60 09/28/2015                               Hepatic Function Markers Lab Results  Component Value Date   AST 19 09/28/2015   ALT 22 09/28/2015   ALBUMIN 3.5 09/28/2015   ALKPHOS 100 09/28/2015                        Electrolytes Lab Results  Component Value Date   NA 137 09/28/2015   K 4.2 09/28/2015  CL 106 09/28/2015   CALCIUM 8.9 09/28/2015   MG 1.8 03/09/2016                        Neuropathy Markers Lab Results  Component Value Date   WRUEAVWU98 119 (H) 03/09/2016                        Bone Pathology Markers Lab Results  Component Value Date   25OHVITD1 49 03/09/2016   25OHVITD2 <1.0 03/09/2016   25OHVITD3 48 03/09/2016                         Coagulation Parameters Lab Results  Component Value Date   INR 0.9 03/06/2012   LABPROT 12.8 03/06/2012   PLT 415 11/09/2012                        Cardiovascular Markers Lab Results  Component Value Date   CKTOTAL 57 11/09/2012   CKMB < 0.5 (L) 11/09/2012   TROPONINI < 0.02 11/09/2012   HGB 12.3 11/09/2012   HCT 36.1 11/09/2012                         CA Markers No results found for: CEA, CA125, LABCA2                      Note: Lab results reviewed.  Recent Diagnostic Imaging Results  DG C-Arm 1-60 Min-No Report Fluoroscopy was utilized by the requesting physician.  No radiographic  interpretation.   Complexity Note: Imaging results reviewed. Results shared with Ms. Bartoli, using Layman's terms.                         Meds   Current Outpatient Medications:  .  amitriptyline (ELAVIL) 100 MG tablet, 200 mg at bedtime. , Disp: , Rfl:  .  Cholecalciferol (VITAMIN D3) 2000 units capsule, Take 1 capsule (2,000 Units total) by mouth daily., Disp: 30 capsule, Rfl: 2 .  cyanocobalamin ([employer] VITAMIN B12) 2000 MCG tablet, Take 1 tablet (2,000 mcg total) by mouth daily., Disp: 30 tablet, Rfl: 2 .  cyclobenzaprine (FLEXERIL) 5 MG tablet, Take 1 tablet (5 mg total) by mouth 4 (four) times daily., Disp: 120 tablet, Rfl: 2 .  desonide (DESOWEN) 0.05 %  cream, desonide 0.05 % topical cream as needed, Disp: , Rfl:  .  dicyclomine (BENTYL) 20 MG tablet, Take 20 mg by mouth as needed. , Disp: , Rfl:  .  estradiol (ESTRACE) 1 MG tablet, Take 1 mg by mouth daily. , Disp: , Rfl:  .  fluticasone (FLONASE) 50 MCG/ACT nasal spray, Place 2 sprays into both nostrils as needed. , Disp: , Rfl:  .  gemfibrozil (LOPID) 600 MG tablet, Take by mouth 2 (two) times daily before a meal. , Disp: , Rfl:  .  [START ON 04/19/2018] HYDROcodone-acetaminophen (NORCO/VICODIN) 5-325 MG tablet, Take 1 tablet by mouth every 4 (four) hours as needed for severe pain., Disp: 180 tablet, Rfl: 0 .  INCRUSE ELLIPTA 62.5 MCG/INH AEPB, 1 puff as needed. , Disp: , Rfl:  .  Melatonin 10 MG CAPS, Take 20 mg by mouth at bedtime as needed., Disp: 60 capsule, Rfl: 2 .  metroNIDAZOLE (FLAGYL) 500 MG tablet, Take 500 mg by mouth 2 (two) times daily., Disp: , Rfl:  .  montelukast (SINGULAIR) 10 MG tablet, Take by mouth at bedtime. , Disp: , Rfl:  .  omeprazole (PRILOSEC) 20 MG capsule, Take 20 mg by mouth 2 (two) times daily before a meal. , Disp: , Rfl:  .  [START ON 02/18/2018] pregabalin (LYRICA) 100 MG capsule, Take 1 capsule (100 mg total) by mouth at bedtime., Disp: 30 capsule, Rfl: 2 .  propranolol (INDERAL) 40 MG tablet, TAKE 1 TABLET BY MOUTH 5 TIMES DAILY AS DIRECTED, Disp: , Rfl:  .  propranolol ER (INDERAL LA) 80 MG 24 hr capsule, TAKE 2 CAPSULES BY MOUTH ONCE DAILY, Disp: , Rfl:  .  salmeterol (SEREVENT DISKUS) 50 MCG/DOSE diskus inhaler, Inhale 1 puff into the lungs 2 (two) times daily. , Disp: , Rfl:  .  zolpidem (AMBIEN) 10 MG tablet, 10 mg as needed. , Disp: , Rfl:  .  [START ON 03/20/2018] HYDROcodone-acetaminophen (NORCO/VICODIN) 5-325 MG tablet, Take 1 tablet by mouth every 4 (four) hours as needed for severe pain., Disp: 180 tablet, Rfl: 0 .  [START ON 02/18/2018] HYDROcodone-acetaminophen (NORCO/VICODIN) 5-325 MG tablet, Take 1 tablet by mouth every 4 (four) hours as needed for  severe pain., Disp: 180 tablet, Rfl: 0 .  pregabalin (LYRICA) 25 MG capsule, Take 1 capsule (25 mg total) by mouth 2 (two) times daily., Disp: 60 capsule, Rfl: 2  ROS  Constitutional: Denies any fever or chills Gastrointestinal: No reported hemesis, hematochezia, vomiting, or acute GI distress Musculoskeletal: Denies any acute onset joint swelling, redness, loss of ROM, or weakness Neurological: No reported episodes of acute onset apraxia, aphasia, dysarthria, agnosia, amnesia, paralysis, loss of coordination, or loss of consciousness  Allergies  Ms. Lynn is allergic to prednisone and other.  PFSH  Drug: Ms. Tomkiewicz  reports that she does not use drugs. Alcohol:  reports that she does not drink alcohol. Tobacco:  reports that she has never smoked. She has never used smokeless tobacco. Medical:  has a past medical history of Abnormal heart rhythm, Abnormal mammogram (02/25/2014), Anxiety, Asthma, Awareness of heartbeats (06/29/2015), Breath shortness (06/29/2015), CAD (coronary artery disease), Cancer (HCC), Cervical radicular pain (Location of Secondary source of pain) (Bilateral) (R>L) (C8 Dermatome) (06/29/2015), Chronic pain, COPD (chronic obstructive pulmonary disease) ([place]), Degenerative disk disease, Depression, Endometriosis, Family history of chronic pain (06/29/2015), Fibromyalgia, Fibromyalgia, GERD (gastroesophageal reflux disease), Hiatal hernia, History of attempted suicide (06/29/2015), History of hiatal hernia (06/29/2015), History of psychiatric disorder (06/29/2015), History of suicidal ideation (06/29/2015), Hyperlipidemia, Insomnia, Insomnia, and Irritable bowel syndrome. Surgical: Ms. Westfall  has a past surgical history that includes Abdominal hysterectomy (1986); Tonsillectomy (1957); Skin lesion excision; Breast surgery; and Breast biopsy (Right, 2016). Family: family history includes Breast cancer in her maternal aunt; Heart disease in her father; Hypertension in her  father and mother.  Constitutional Exam  General appearance: Well nourished, well developed, and well hydrated. In no apparent acute distress Vitals:   02/14/18 1254  BP: 121/69  Pulse: 69  Resp: 16  Temp: 97.7 F (36.5 C)  TempSrc: Oral  SpO2: 100%  Weight: 158 lb (71.7 kg)  Height: '5\' 3"'  (1.6 m)  Psych/Mental status: Alert, oriented x 3 (person, place, & time)       Eyes: PERLA Respiratory: No evidence of acute respiratory distress  Cervical Spine Area Exam  Skin & Axial Inspection: cervical kyphosis Alignment: Symmetrical Functional ROM: Diminished ROM      Stability: No instability detected Muscle Tone/Strength: Guarding observed Sensory (Neurological): Unimpaired Palpation: Complains of area being tender to palpation  Upper Extremity (UE) Exam    Side: Right upper extremity  Side: Left upper extremity  Skin & Extremity Inspection: Skin color, temperature, and hair growth are WNL. No peripheral edema or cyanosis. No masses, redness, swelling, asymmetry, or associated skin lesions. No contractures.  Skin & Extremity Inspection: Skin color, temperature, and hair growth are WNL. No peripheral edema or cyanosis. No masses, redness, swelling, asymmetry, or associated skin lesions. No contractures.  Functional ROM: Unrestricted ROM          Functional ROM: Unrestricted ROM          Muscle Tone/Strength: Functionally intact. No obvious neuro-muscular anomalies detected.  Muscle Tone/Strength: Functionally intact. No obvious neuro-muscular anomalies detected.  Sensory (Neurological): Unimpaired          Sensory (Neurological): Unimpaired          Palpation: No palpable anomalies              Palpation: No palpable anomalies              Provocative Test(s):  Phalen's test: deferred Tinel's test: deferred Apley's scratch test (touch opposite shoulder):  Action 1 (Across chest): deferred Action 2 (Overhead): deferred Action 3 (LB reach): deferred   Provocative Test(s):   Phalen's test: deferred Tinel's test: deferred Apley's scratch test (touch opposite shoulder):  Action 1 (Across chest): deferred Action 2 (Overhead): deferred Action 3 (LB reach): deferred    Gait & Posture Assessment  Ambulation: Unassisted Gait: Relatively normal for age and body habitus Posture: WNL   Assessment  Primary Diagnosis & Pertinent Problem List: The primary encounter diagnosis was Lumbar spondylosis. Diagnoses of Cervical spondylosis, Myofascial pain syndrome, Chronic pain syndrome, Fibromyalgia, Vitamin B12 deficiency, and Vitamin D insufficiency were also pertinent to this visit.  Status Diagnosis  Having a Flare-up Having a Flare-up Having a Flare-up 1. Lumbar spondylosis   2. Cervical spondylosis   3. Myofascial pain syndrome   4. Chronic pain syndrome   5. Fibromyalgia   6. Vitamin B12 deficiency   7. Vitamin D insufficiency     Problems updated and reviewed during this visit: No problems updated. Plan of Care  Pharmacotherapy (Medications Ordered): Meds ordered this encounter  Medications  . HYDROcodone-acetaminophen (NORCO/VICODIN) 5-325 MG tablet    Sig: Take 1 tablet by mouth every 4 (four) hours as needed for severe pain.    Dispense:  180 tablet    Refill:  0    Do not place this medication, or any other prescription from our practice, on "Automatic Refill". Patient may have prescription filled one day early if pharmacy is closed on scheduled refill date. Do not fill until:04/19/2018 To last until:05/19/2018    Order Specific Question:   Supervising Provider    Answer:   Milinda Pointer [phone]  . HYDROcodone-acetaminophen (NORCO/VICODIN) 5-325 MG tablet    Sig: Take 1 tablet by mouth every 4 (four) hours as needed for severe pain.    Dispense:  180 tablet    Refill:  0    Do not place this medication, or any other prescription from our practice, on "Automatic Refill". Patient may have prescription filled one day early if pharmacy is closed  on scheduled refill date. Do not fill until:03/20/2018 To last until:04/19/2018    Order Specific Question:   Supervising Provider    Answer:   Milinda Pointer [phone]  . HYDROcodone-acetaminophen (NORCO/VICODIN) 5-325 MG tablet    Sig: Take 1 tablet by mouth every 4 (four) hours as needed for severe  pain.    Dispense:  180 tablet    Refill:  0    Do not place this medication, or any other prescription from our practice, on "Automatic Refill". Patient may have prescription filled one day early if pharmacy is closed on scheduled refill date. Do not fill until: 02/18/2018 To last until:03/20/2018    Order Specific Question:   Supervising Provider    Answer:   Milinda Pointer [phone]  . pregabalin (LYRICA) 100 MG capsule    Sig: Take 1 capsule (100 mg total) by mouth at bedtime.    Dispense:  30 capsule    Refill:  2    Do not place this medication, or any other prescription from our practice, on "Automatic Refill". Patient may have prescription filled one day early if pharmacy is closed on scheduled refill date.    Order Specific Question:   Supervising Provider    Answer:   Milinda Pointer [phone]  . cyanocobalamin ([employer] VITAMIN B12) 2000 MCG tablet    Sig: Take 1 tablet (2,000 mcg total) by mouth daily.    Dispense:  30 tablet    Refill:  2    Do not place this medication, or any other prescription from our practice, on "Automatic Refill".    Order Specific Question:   Supervising Provider    Answer:   Milinda Pointer [phone]  . Cholecalciferol (VITAMIN D3) 2000 units capsule    Sig: Take 1 capsule (2,000 Units total) by mouth daily.    Dispense:  30 capsule    Refill:  2    Do not place this medication, or any other prescription from our practice, on "Automatic Refill".    Order Specific Question:   Supervising Provider    Answer:   Milinda Pointer [phone]  . pregabalin (LYRICA) 25 MG capsule    Sig: Take 1 capsule (25 mg total) by mouth 2 (two) times daily.     Dispense:  60 capsule    Refill:  2    Do not place this medication, or any other prescription from our practice, on "Automatic Refill". Patient may have prescription filled one day early if pharmacy is closed on scheduled refill date.    Order Specific Question:   Supervising Provider    Answer:   Milinda Pointer [phone]  . cyclobenzaprine (FLEXERIL) 5 MG tablet    Sig: Take 1 tablet (5 mg total) by mouth 4 (four) times daily.    Dispense:  120 tablet    Refill:  2    Do not place this medication, or any other prescription from our practice, on "Automatic Refill". Patient may have prescription filled one day early if pharmacy is closed on scheduled refill date.    Order Specific Question:   Supervising Provider    Answer:   Milinda Pointer [[id number]]   New Prescriptions   PREGABALIN (LYRICA) 25 MG CAPSULE    Take 1 capsule (25 mg total) by mouth 2 (two) times daily.   Medications administered today: Caylei L. Erskin had no medications administered during this visit. Lab-work, procedure(s), and/or referral(s): No orders of the defined types were placed in this encounter.  Imaging and/or referral(s): None  nterventional therapies: Planned, scheduled, and/or pending: Not at this time.    Considering:  Diagnostic bilateral cervical facetblock  Possible bilateral cervical facet radiofrequencyablation  Diagnostic right-sided cervical epiduralsteroid injection  Diagnostic bilateral intra-articular shoulder jointinjection  Diagnostic bilateral suprascapular nerveblock  Possible bilateral suprascapular nerve radiofrequencyablation  Diagnostic bilateral lumbar facetblock  Possible bilateral lumbar facet radiofrequencyablation  Diagnostic left sided carpal tunnel injection   Palliative PRN treatment(s):  Palliativebilateral cervical facetblock  Palliativeright-sided cervical epiduralsteroid injection  Palliativebilateral intra-articular shoulderjoint  injection Palliativebilateral suprascapular nerveblock  Palliativebilateral lumbar facet block  Palliativeleft sided L4-5 lumbar epidural steroid injection  Palliativeleft sided carpal tunnelinjection    Provider-requested follow-up: Return in about 3 months (around 05/17/2018) for MedMgmt with Me Donella Stade Edison Pace).  Future Appointments  Date Time Provider [place]  02/19/2018  1:00 PM [facility]  02/19/2018  1:40 PM [facility]  05/10/2018 12:45 PM Vevelyn Francois, NP [facility] None   Primary Care Physician: Denton Lank, MD Location: [facility] Management Facility Note by: Vevelyn Francois NP Date: 02/14/2018; Time: 2:33 PM  Pain Score Disclaimer: We use the NRS-11 scale. This is a self-reported, subjective measurement of pain severity with only modest accuracy. It is used primarily to identify changes within a particular patient. It must be understood that outpatient pain scales are significantly less accurate that those used for research, where they can be applied under ideal controlled circumstances with minimal exposure to variables. In reality, the score is likely to be a combination of pain intensity and pain affect, where pain affect describes the degree of emotional arousal or changes in action readiness caused by the sensory experience of pain. Factors such as social and work situation, setting, emotional state, anxiety levels, expectation, and prior pain experience may influence pain perception and show large inter-individual differences that may also be affected by time variables.  Patient instructions provided during this appointment: Patient Instructions  ____________________________________________________________________________________________  Medication Rules  Applies to: All patients receiving prescriptions (written or electronic).  Pharmacy of record: Pharmacy where electronic prescriptions will be sent. If written  prescriptions are taken to a different pharmacy, please inform the nursing staff. The pharmacy listed in the electronic medical record should be the one where you would like electronic prescriptions to be sent.  Prescription refills: Only during scheduled appointments. Applies to both, written and electronic prescriptions.  NOTE: The following applies primarily to controlled substances (Opioid* Pain Medications).   Patient's responsibilities: 1. Pain Pills: Bring all pain pills to every appointment (except for procedure appointments). 2. Pill Bottles: Bring pills in original pharmacy bottle. Always bring newest bottle. Bring bottle, even if empty. 3. Medication refills: You are responsible for knowing and keeping track of what medications you need refilled. The day before your appointment, write a list of all prescriptions that need to be refilled. Bring that list to your appointment and give it to the admitting nurse. Prescriptions will be written only during appointments. If you forget a medication, it will not be "Called in", "Faxed", or "electronically sent". You will need to get another appointment to get these prescribed. 4. Prescription Accuracy: You are responsible for carefully inspecting your prescriptions before leaving our office. Have the discharge nurse carefully go over each prescription with you, before taking them home. Make sure that your name is accurately spelled, that your address is correct. Check the name and dose of your medication to make sure it is accurate. Check the number of pills, and the written instructions to make sure they are clear and accurate. Make sure that you are given enough medication to last until your next medication refill appointment. 5. Taking Medication: Take medication as prescribed. Never take more pills than instructed. Never take medication more frequently than prescribed. Taking less pills or less frequently is permitted and  encouraged, when it comes to  controlled substances (written prescriptions).  6. Inform other Doctors: Always inform, all of your healthcare providers, of all the medications you take. 7. Pain Medication from other Providers: You are not allowed to accept any additional pain medication from any other Doctor or Healthcare provider. There are two exceptions to this rule. (see below) In the event that you require additional pain medication, you are responsible for notifying us, as stated below. 8. Medication Agreement: You are responsible for carefully reading and following our Medication Agreement. This must be signed before receiving any prescriptions from our practice. Safely store a copy of your signed Agreement. Violations to the Agreement will result in no further prescriptions. (Additional copies of our Medication Agreement are available upon request.) 9. Laws, Rules, & Regulations: All patients are expected to follow all Federal and [employer], [employer], Rules, [employer]. Ignorance of the Laws does not constitute a valid excuse. The use of any illegal substances is prohibited. 10. Adopted CDC guidelines & recommendations: Target dosing levels will be at or below 60 MME/day. Use of benzodiazepines** is not recommended.  Exceptions: There are only two exceptions to the rule of not receiving pain medications from other Healthcare Providers. 1. Exception #1 (Emergencies): In the event of an emergency (i.e.: accident requiring emergency care), you are allowed to receive additional pain medication. However, you are responsible for: As soon as you are able, call our office [phone], at any time of the day or night, and leave a message stating your name, the date and nature of the emergency, and the name and dose of the medication prescribed. In the event that your call is answered by a member of our staff, make sure to document and save the date, time, and the name of the person that took your information.  2. Exception #2  (Planned Surgery): In the event that you are scheduled by another doctor or dentist to have any type of surgery or procedure, you are allowed (for a period no longer than 30 days), to receive additional pain medication, for the acute post-op pain. However, in this case, you are responsible for picking up a copy of our "Post-op Pain Management for Surgeons" handout, and giving it to your surgeon or dentist. This document is available at our office, and does not require an appointment to obtain it. Simply go to our office during business hours (Monday-Thursday from 8:00 AM to 4:00 PM) (Friday 8:00 AM to 12:00 Noon) or if you have a scheduled appointment with [place], prior to your surgery, and ask for it by name. In addition, you will need to provide [place] with your name, name of your surgeon, type of surgery, and date of procedure or surgery.  *Opioid medications include: morphine, codeine, oxycodone, oxymorphone, hydrocodone, hydromorphone, meperidine, tramadol, tapentadol, buprenorphine, fentanyl, methadone. **Benzodiazepine medications include: diazepam (Valium), alprazolam (Xanax), clonazepam (Klonopine), lorazepam (Ativan), clorazepate (Tranxene), chlordiazepoxide (Librium), estazolam (Prosom), oxazepam (Serax), temazepam (Restoril), triazolam (Halcion) (Last updated: 11/09/2017) ____________________________________________________________________________________________

## 2018-02-19 ENCOUNTER — Ambulatory Visit
Admission: RE | Admit: 2018-02-19 | Discharge: 2018-02-19 | Disposition: A | Discharge status: Home / Self Care | Payer: Medicare Other | Source: Ambulatory Visit | Attending: Family Medicine | Admitting: Family Medicine

## 2018-02-19 DIAGNOSIS — Z1231 Encounter for screening mammogram for malignant neoplasm of breast: Secondary | ICD-10-CM | POA: Insufficient documentation

## 2018-02-19 DIAGNOSIS — Z79890 Hormone replacement therapy (postmenopausal): Secondary | ICD-10-CM | POA: Insufficient documentation

## 2018-02-19 DIAGNOSIS — Z0000 Encounter for general adult medical examination without abnormal findings: Secondary | ICD-10-CM | POA: Insufficient documentation

## 2018-02-19 DIAGNOSIS — N959 Unspecified menopausal and perimenopausal disorder: Secondary | ICD-10-CM | POA: Diagnosis not present

## 2018-03-07 ENCOUNTER — Emergency Department: Payer: Medicare Other

## 2018-03-07 ENCOUNTER — Emergency Department
Admission: EM | Admit: 2018-03-07 | Discharge: 2018-03-07 | Disposition: A | Discharge status: Home / Self Care | Payer: Medicare Other | Attending: Emergency Medicine | Admitting: Emergency Medicine

## 2018-03-07 ENCOUNTER — Other Ambulatory Visit: Payer: Self-pay

## 2018-03-07 DIAGNOSIS — Y939 Activity, unspecified: Secondary | ICD-10-CM | POA: Insufficient documentation

## 2018-03-07 DIAGNOSIS — Y999 Unspecified external cause status: Secondary | ICD-10-CM | POA: Insufficient documentation

## 2018-03-07 DIAGNOSIS — S99912A Unspecified injury of left ankle, initial encounter: Secondary | ICD-10-CM | POA: Diagnosis present

## 2018-03-07 DIAGNOSIS — S82432A Displaced oblique fracture of shaft of left fibula, initial encounter for closed fracture: Secondary | ICD-10-CM | POA: Insufficient documentation

## 2018-03-07 DIAGNOSIS — Y92009 Unspecified place in unspecified non-institutional (private) residence as the place of occurrence of the external cause: Secondary | ICD-10-CM | POA: Insufficient documentation

## 2018-03-07 DIAGNOSIS — J449 Chronic obstructive pulmonary disease, unspecified: Secondary | ICD-10-CM | POA: Insufficient documentation

## 2018-03-07 DIAGNOSIS — W108XXA Fall (on) (from) other stairs and steps, initial encounter: Secondary | ICD-10-CM | POA: Diagnosis not present

## 2018-03-07 DIAGNOSIS — S82892A Other fracture of left lower leg, initial encounter for closed fracture: Secondary | ICD-10-CM

## 2018-03-07 DIAGNOSIS — I2510 Atherosclerotic heart disease of native coronary artery without angina pectoris: Secondary | ICD-10-CM | POA: Insufficient documentation

## 2018-03-07 LAB — COMPREHENSIVE METABOLIC PANEL
ALBUMIN: 3.9 g/dL (ref 3.5–5.0)
ALT: 22 U/L (ref 0–44)
AST: 32 U/L (ref 15–41)
Alkaline Phosphatase: 62 U/L (ref 38–126)
Anion gap: 12 (ref 5–15)
BILIRUBIN TOTAL: 0.5 mg/dL (ref 0.3–1.2)
BUN: 19 mg/dL (ref 8–23)
CALCIUM: 9 mg/dL (ref 8.9–10.3)
CO2: 25 mmol/L (ref 22–32)
Chloride: 100 mmol/L (ref 98–111)
Creatinine, Ser: 0.91 mg/dL (ref 0.44–1.00)
GFR calc Af Amer: >60 mL/min (ref >60)
GFR calc non Af Amer: >60 mL/min (ref >60)
GLUCOSE: 121 mg/dL — AB (ref 70–99)
Potassium: 4.1 mmol/L (ref 3.5–5.1)
SODIUM: 137 mmol/L (ref 135–145)
TOTAL PROTEIN: 7.3 g/dL (ref 6.5–8.1)

## 2018-03-07 LAB — CBC WITH DIFFERENTIAL/PLATELET
BASOS PCT: 0 %
Basophils Absolute: 0 10*3/uL (ref 0–0.1)
Eosinophils Absolute: 0 10*3/uL (ref 0–0.7)
Eosinophils Relative: 1 %
HEMATOCRIT: 34.7 % — AB (ref 35.0–47.0)
HEMOGLOBIN: 12.1 g/dL (ref 12.0–16.0)
Lymphocytes Relative: 30 %
Lymphs Abs: 2.7 10*3/uL (ref 1.0–3.6)
MCH: 33.6 pg (ref 26.0–34.0)
MCHC: 35 g/dL (ref 32.0–36.0)
MCV: 96.1 fL (ref 80.0–100.0)
Monocytes Absolute: 0.8 10*3/uL (ref 0.2–0.9)
Monocytes Relative: 9 %
NEUTROS PCT: 60 %
Neutro Abs: 5.6 10*3/uL (ref 1.4–6.5)
Platelets: 333 10*3/uL (ref 150–440)
RBC: 3.61 MIL/uL — AB (ref 3.80–5.20)
RDW: 12.6 % (ref 11.5–14.5)
WBC: 9.2 10*3/uL (ref 3.6–11.0)

## 2018-03-07 MED ORDER — HYDROMORPHONE HCL 1 MG/ML IJ SOLN
1.0000 mg | INTRAMUSCULAR | Status: AC
  Administered 2018-03-07 (×2): 1 mg via INTRAVENOUS

## 2018-03-07 MED ORDER — HYDROMORPHONE HCL 1 MG/ML IJ SOLN
1.0000 mg | Freq: Once | INTRAMUSCULAR | Status: AC
  Administered 2018-03-07: 1 mg via INTRAVENOUS
  Filled 2018-03-07: qty 1

## 2018-03-07 MED ORDER — HYDROMORPHONE HCL 1 MG/ML IJ SOLN
INTRAMUSCULAR | Status: AC
  Administered 2018-03-07: 1 mg via INTRAVENOUS
  Filled 2018-03-07: qty 1

## 2018-03-07 MED ORDER — OXYCODONE-ACETAMINOPHEN 7.5-325 MG PO TABS: 1.0000 | ORAL_TABLET | ORAL | 0 refills | Status: DC | PRN

## 2018-03-07 NOTE — ED Provider Notes (Addendum)
[facility] Emergency Department Provider Note       Time seen: ----------------------------------------- 7:57 AM on 03/07/2018 -----------------------------------------   I have reviewed the triage vital signs and the nursing notes.  HISTORY   Chief Complaint No chief complaint on file.    HPI Zoe Sanchez is a 62 y.o. female with a history of anxiety, asthma, coronary disease, COPD, degenerative disc disease, fibromyalgia who presents to the ED for a fall.  Patient states she was going up some concrete steps at her home when she tripped and fell.  She subsequently twisted her left ankle and heard and felt a pop.  He has been markedly swollen and she is certain that it there is a fracture there.  She was able to slide around on the floor but she cannot bear any weight on the left ankle.  She denies any other injuries or trauma.  Past Medical History:  Diagnosis Date  . Abnormal heart rhythm   . Abnormal mammogram 02/25/2014  . Anxiety   . Asthma   . Awareness of heartbeats 06/29/2015  . Breath shortness 06/29/2015  . CAD (coronary artery disease)   . Cancer ([place])   . Cervical radicular pain (Location of Secondary source of pain) (Bilateral) (R>L) (C8 Dermatome) 06/29/2015  . Chronic pain   . COPD (chronic obstructive pulmonary disease) ([place])   . Degenerative disk disease   . Depression   . Endometriosis   . Family history of chronic pain 06/29/2015  . Fibromyalgia   . Fibromyalgia   . GERD (gastroesophageal reflux disease)   . Hiatal hernia   . History of attempted suicide 06/29/2015  . History of hiatal hernia 06/29/2015  . History of psychiatric disorder 06/29/2015  . History of suicidal ideation 06/29/2015  . Hyperlipidemia   . Insomnia   . Insomnia   . Irritable bowel syndrome     Patient Active Problem List   Diagnosis Date Noted  . L4-5 Lumbar Facet joint Synovial cyst 05/24/2017  . Chronic lower extremity pain (Bilateral) (R>L)  05/24/2017  . Chronic lumbar radicular pain (Bilateral) (S1) 05/24/2017  . Chronic upper extremity pain (Secondary source of pain) (Bilateral) (R>L) 04/11/2017  . Lumbar facet syndrome (Bilateral) (R>L) 03/22/2017  . Lumbar facet arthropathy 03/02/2017  . Bimalleolar fracture of left ankle 07/21/2016  . Cervical facet arthropathy 03/19/2016  . Chronic shoulder pain (Bilateral) 12/24/2015  . Opioid-induced constipation (OIC) 12/23/2015  . Carpal tunnel syndrome (Left) 12/23/2015  . Claustrophobia 12/23/2015  . Long term prescription opiate use 09/28/2015  . Vitamin D insufficiency 09/28/2015  . Encounter for therapeutic drug level monitoring 06/29/2015  . Long term current use of opiate analgesic 06/29/2015  . Opiate use (30 MME/Day) 06/29/2015  . Magnesium deficiency 06/29/2015  . Vitamin B12 deficiency 06/29/2015  . Myofascial pain syndrome 06/29/2015  . Cervical facet syndrome (Bilateral) (R>L) 06/29/2015  . Chronic neck pain (Primary Source of Pain) (Bilateral) (R>L) 06/29/2015  . Chronic pain syndrome 06/29/2015  . Cervical spondylosis 06/29/2015  . Anxiety 06/29/2015  . Paroxysmal supraventricular tachycardia ([place]) 06/29/2015  . Syncope and collapse 06/29/2015  . Fibromyalgia 06/29/2015  . Cervical herniated disc (C5-6 and C6-7) 06/29/2015  . Cervical foraminal stenosis (Bilateral) (C5-6) 06/29/2015  . Chronic cervical radicular pain (Bilateral) (R>L) (C8 Dermatome) 06/29/2015  . Chronic low back pain [facility] source of pain) (Bilateral) (R>L) 06/29/2015  . Lumbar spondylosis 06/29/2015  . Coronary atherosclerosis of native coronary artery 06/29/2015  . Generalized anxiety disorder 06/29/2015  . Intermittent  urinary incontinence 06/29/2015  . History of psychiatric disorder 06/29/2015  . Depression 06/29/2015  . Family history of chronic pain 06/29/2015  . History of attempted suicide 06/29/2015  . History of suicidal ideation 06/29/2015  . Irritable bowel syndrome  06/29/2015  . Intermittent diarrhea 06/29/2015  . Chronic constipation 06/29/2015  . Insomnia secondary to chronic pain 06/29/2015  . History of hiatal hernia 06/29/2015  . Microcalcifications of the breast 02/25/2014    Past Surgical History:  Procedure Laterality Date  . ABDOMINAL HYSTERECTOMY  1986  . BREAST BIOPSY Right 2016   CORE W/CLIP - EARLY INTRADUCTAL PAPILLOMA  . BREAST SURGERY    . OOPHORECTOMY    . SKIN LESION EXCISION     basal cell and squamous cell  . TONSILLECTOMY  1957    Allergies Prednisone and Other  Social History Social History   Tobacco Use  . Smoking status: Never Smoker  . Smokeless tobacco: Never Used  Substance Use Topics  . Alcohol use: No    Alcohol/week: 0.0 oz  . Drug use: No   Review of Systems Constitutional: Negative for fever. Cardiovascular: Negative for chest pain. Respiratory: Negative for shortness of breath. Gastrointestinal: Negative for abdominal pain, vomiting and diarrhea. Musculoskeletal: Positive for left ankle pain and swelling Skin: Positive for left ankle ecchymosis Neurological: Negative for headaches, focal weakness or numbness.  All systems negative/normal/unremarkable except as stated in the HPI  ____________________________________________   PHYSICAL EXAM:  VITAL SIGNS: ED Triage Vitals  Enc Vitals Group     BP      Pulse      Resp      Temp      Temp src      SpO2      Weight      Height      Head Circumference      Peak Flow      Pain Score      Pain Loc      Pain Edu?      Excl. in [place]?    Constitutional: Alert and oriented.  Mild distress from pain Eyes: Conjunctivae are normal. Normal extraocular movements. ENT   Head: Normocephalic and atraumatic.   Nose: No congestion/rhinnorhea.   Mouth/Throat: Mucous membranes are moist.   Neck: No stridor. Cardiovascular: Normal rate, regular rhythm. No murmurs, rubs, or gallops. Respiratory: Normal respiratory effort without  tachypnea nor retractions. Breath sounds are clear and equal bilaterally. No wheezes/rales/rhonchi. Gastrointestinal: Soft and nontender. Normal bowel sounds Musculoskeletal: Severe tenderness around the left ankle, particularly medially.  There is extensive ecchymosis and swelling as well as deformity.  Laterally the left ankle appears to be intact Neurologic:  Normal speech and language. No gross focal neurologic deficits are appreciated.  Skin: Left ankle ecchymosis and swelling as noted above Psychiatric: Mood and affect are normal. Speech and behavior are normal.  ____________________________________________  ED COURSE:  As part of my medical decision making, I reviewed the following data within the [place] History obtained from family if available, nursing notes, old chart and ekg, as well as notes from prior ED visits. Patient presented for left ankle pain and swelling after a fall, we will assess with labs and imaging as indicated at this time. Clinical Course as of Mar 07 925  Wed Mar 07, 2018  0827 X-rays reveal an unstable ankle fracture.  Will place in a posterior and sugar tong splint.   [JW]    Clinical Course User Index [JW] Jimmye Norman,  Algis Liming, MD   Procedures ____________________________________________   LABS (pertinent positives/negatives)  Labs Reviewed  CBC WITH DIFFERENTIAL/PLATELET - Abnormal; Notable for the following components:      Result Value   RBC 3.61 (*)    HCT 34.7 (*)    All other components within normal limits  COMPREHENSIVE METABOLIC PANEL    RADIOLOGY Images were viewed by me  Left ankle x-rays IMPRESSION: Oblique fracture of the distal fibula with ankle joint subluxation/dislocation. Possible nondisplaced fracture of the posterior lip of the tibia. ____________________________________________  DIFFERENTIAL DIAGNOSIS   Fracture, dislocation, contusion  FINAL ASSESSMENT AND PLAN  Left ankle  fracture   Plan: The patient had presented for a fall at home. Patient's labs were unremarkable. Patient's imaging did reveal an unstable left ankle fracture.  She was placed in a posterior and sugar tong splint with adequate immobilization.  I have discussed with orthopedic surgery who will see the patient in close outpatient follow-up tomorrow.  She will remain with nonweightbearing.   Laurence Aly, MD   Note: This note was generated in part or whole with voice recognition software. Voice recognition is usually quite accurate but there are transcription errors that can and very often do occur. I apologize for any typographical errors that were not detected and corrected.     Earleen Newport, MD 03/07/18 5053    Earleen Newport, MD 03/07/18 1025

## 2018-03-07 NOTE — ED Triage Notes (Signed)
Patient to ED from home via [facility]. Per EMS patient fell walking up stairs, twisted left ankle, and fell to the side. Patient denies hitting head. Patient alert and oriented x4 in no acute distress.

## 2018-03-12 ENCOUNTER — Other Ambulatory Visit: Payer: Self-pay

## 2018-03-12 ENCOUNTER — Telehealth: Payer: Self-pay | Admitting: *Deleted

## 2018-03-12 ENCOUNTER — Ambulatory Visit: Payer: Self-pay | Admitting: Orthopedic Surgery

## 2018-03-12 ENCOUNTER — Encounter
Admission: RE | Admit: 2018-03-12 | Discharge: 2018-03-12 | Disposition: A | Discharge status: Home / Self Care | Payer: Medicare Other | Source: Ambulatory Visit | Attending: Orthopedic Surgery | Admitting: Orthopedic Surgery

## 2018-03-12 DIAGNOSIS — Y92008 Other place in unspecified non-institutional (private) residence as the place of occurrence of the external cause: Secondary | ICD-10-CM | POA: Diagnosis not present

## 2018-03-12 DIAGNOSIS — W010XXA Fall on same level from slipping, tripping and stumbling without subsequent striking against object, initial encounter: Secondary | ICD-10-CM | POA: Diagnosis not present

## 2018-03-12 DIAGNOSIS — F419 Anxiety disorder, unspecified: Secondary | ICD-10-CM | POA: Diagnosis not present

## 2018-03-12 DIAGNOSIS — S82842A Displaced bimalleolar fracture of left lower leg, initial encounter for closed fracture: Secondary | ICD-10-CM | POA: Diagnosis present

## 2018-03-12 DIAGNOSIS — I471 Supraventricular tachycardia: Secondary | ICD-10-CM | POA: Diagnosis not present

## 2018-03-12 DIAGNOSIS — F329 Major depressive disorder, single episode, unspecified: Secondary | ICD-10-CM | POA: Diagnosis not present

## 2018-03-12 DIAGNOSIS — Z79899 Other long term (current) drug therapy: Secondary | ICD-10-CM | POA: Diagnosis not present

## 2018-03-12 DIAGNOSIS — J449 Chronic obstructive pulmonary disease, unspecified: Secondary | ICD-10-CM | POA: Diagnosis not present

## 2018-03-12 DIAGNOSIS — M797 Fibromyalgia: Secondary | ICD-10-CM | POA: Diagnosis not present

## 2018-03-12 DIAGNOSIS — I2510 Atherosclerotic heart disease of native coronary artery without angina pectoris: Secondary | ICD-10-CM | POA: Diagnosis not present

## 2018-03-12 DIAGNOSIS — K219 Gastro-esophageal reflux disease without esophagitis: Secondary | ICD-10-CM | POA: Diagnosis not present

## 2018-03-12 DIAGNOSIS — Y9301 Activity, walking, marching and hiking: Secondary | ICD-10-CM | POA: Diagnosis not present

## 2018-03-12 HISTORY — DX: Anemia, unspecified: D64.9

## 2018-03-12 HISTORY — DX: Cardiac murmur, unspecified: R01.1

## 2018-03-12 HISTORY — DX: Cardiac arrhythmia, unspecified: I49.9

## 2018-03-12 NOTE — Patient Instructions (Addendum)
Your procedure is scheduled on: Wednesday, March 14, 2018  Report to [place] [facility]  To find out your arrival time please call [phone] between 1PM - 3PM on Tuesday, July 2ND  Remember: Instructions that are not followed completely may result in serious medical risk,  up to and including death, or upon the discretion of your surgeon and anesthesiologist your  surgery may need to be rescheduled.     _X__ 1. Do not eat food after 4AM the night before your procedure.                 No gum chewing or hard candies. ABSOLUTELY NOTHING SOLID IN YOUR MOUTH.                  You may drink clear liquids up to 2 hours before you are scheduled to arrive for your surgery-                  DO not drink clear liquids within 2 hours of the start of your surgery.                  Clear Liquids include:  water, apple juice without pulp, clear carbohydrate                 drink such as Clearfast of Gatorade, Black Coffee or Tea (Do not add                 anything to coffee or tea).  __X__2.  On the morning of surgery brush your teeth with toothpaste and water, you                may rinse your mouth with mouthwash if you wish.                  Do not swallow any toothpaste of mouthwash.     _X__ 3.  No Alcohol for 24 hours before or after surgery.   _X__ 4.  Do Not Smoke or use e-cigarettes For 24 Hours Prior to Your Surgery.                 Do not use any chewable tobacco products for at least 6 hours prior to                 surgery.  ____  5.  Bring all medications with you on the day of surgery if instructed.   ____  6.  Notify your doctor if there is any change in your medical condition      (cold, fever, infections).     Do not wear jewelry, make-up, hairpins, clips or nail polish. Do not wear lotions, powders, or perfumes. You may wear deodorant. Do not shave 48 hours prior to surgery. Men may shave face and neck. Do not bring valuables to  the hospital.    [facility] is not responsible for any belongings or valuables.  Contacts, dentures or bridgework may not be worn into surgery. Leave your suitcase in the car. After surgery it may be brought to your room. For patients admitted to the hospital, discharge time is determined by your treatment team.   Patients discharged the day of surgery will not be allowed to drive home.     Please read over the following fact sheets that you were given:  PREPARING FOR SURGERY   ____ Take these medicines the morning of surgery with A SIP OF WATER:    1. PRILOSEC   2. SHORT ACTING INDERAL, ONLY IF NEEDED  3. PERCOCET, IF NEEDED  4. FLAGYL  5. GEMFIBROSIL  6. ESTRACE             7. MAGNESIUM             8. FLEXERIL             9. SEREVENT INHALER  ____ Fleet Enema (as directed)   _X___ Use CHG WIPES as directed  __X__ Use inhalers on the day of surgery  __X__ Stop ALL ASPIRIN PRODUCTS NOW!!             THIS INCLUDES BC POWDER / GOODIES POWDER / EXCEDRIN  _X___ Stop Anti-inflammatories NOW!!            THIS INCLUDES IBUPROFEN / MOTRIN / ADVIL / ALEVE  ____ Stop supplements until after surgery.    ____ Bring C-Pap to the hospital.   CONTINUE TAKING VITAMIN D3 AND B12 BUT NOT ON THE MORNING OF SURGERY  DO NOT TAKE POTASSIUM AND FLUID PILL ON DAY OF SURGERY. THEY GO TOGETHER.  CONTINUE ALL EVENING MEDICINES AS USUAL.  **MAKE SURE TO TAKE THE LONG ACTING INDERAL ON THE NIGHT BEFORE SURGERY**  WEAR LOOSE FITTING PANTS SO THAT YOUR FOOT/BANDAGE CAN GET THRU THE LEG EASILY.

## 2018-03-13 MED ORDER — CEFAZOLIN SODIUM-DEXTROSE 2-4 GM/100ML-% IV SOLN: 2.0000 g | INTRAVENOUS | Status: DC

## 2018-03-13 NOTE — Telephone Encounter (Signed)
Patient called to tell us she fell and broke her ankle. She is scheduled to have surgery 03/14/2018 and wanted to ask [place] re: post op care and pain medications. I stated that she could have family member stop by tomorrow and pick up our "surgeons letter" to give to the surgeon to inform him of how we work with this matter. Patient understands and will have family member pick up. Letter left up front with Juliann Pulse.

## 2018-03-14 ENCOUNTER — Ambulatory Visit
Admission: RE | Admit: 2018-03-14 | Discharge: 2018-03-14 | Disposition: A | Discharge status: Home / Self Care | Payer: Medicare Other | Source: Ambulatory Visit | Attending: Orthopedic Surgery | Admitting: Orthopedic Surgery

## 2018-03-14 ENCOUNTER — Ambulatory Visit: Payer: Medicare Other | Admitting: Anesthesiology

## 2018-03-14 ENCOUNTER — Encounter
Admission: RE | Disposition: A | Discharge status: Home / Self Care | Payer: Self-pay | Source: Ambulatory Visit | Attending: Orthopedic Surgery

## 2018-03-14 DIAGNOSIS — Y92008 Other place in unspecified non-institutional (private) residence as the place of occurrence of the external cause: Secondary | ICD-10-CM | POA: Insufficient documentation

## 2018-03-14 DIAGNOSIS — J449 Chronic obstructive pulmonary disease, unspecified: Secondary | ICD-10-CM | POA: Insufficient documentation

## 2018-03-14 DIAGNOSIS — F329 Major depressive disorder, single episode, unspecified: Secondary | ICD-10-CM | POA: Insufficient documentation

## 2018-03-14 DIAGNOSIS — F419 Anxiety disorder, unspecified: Secondary | ICD-10-CM | POA: Insufficient documentation

## 2018-03-14 DIAGNOSIS — Y9301 Activity, walking, marching and hiking: Secondary | ICD-10-CM | POA: Insufficient documentation

## 2018-03-14 DIAGNOSIS — S82842A Displaced bimalleolar fracture of left lower leg, initial encounter for closed fracture: Secondary | ICD-10-CM | POA: Diagnosis not present

## 2018-03-14 DIAGNOSIS — K219 Gastro-esophageal reflux disease without esophagitis: Secondary | ICD-10-CM | POA: Insufficient documentation

## 2018-03-14 DIAGNOSIS — M797 Fibromyalgia: Secondary | ICD-10-CM | POA: Insufficient documentation

## 2018-03-14 DIAGNOSIS — W010XXA Fall on same level from slipping, tripping and stumbling without subsequent striking against object, initial encounter: Secondary | ICD-10-CM | POA: Insufficient documentation

## 2018-03-14 DIAGNOSIS — I2510 Atherosclerotic heart disease of native coronary artery without angina pectoris: Secondary | ICD-10-CM | POA: Insufficient documentation

## 2018-03-14 DIAGNOSIS — Z79899 Other long term (current) drug therapy: Secondary | ICD-10-CM | POA: Insufficient documentation

## 2018-03-14 DIAGNOSIS — I471 Supraventricular tachycardia: Secondary | ICD-10-CM | POA: Insufficient documentation

## 2018-03-14 HISTORY — PX: ORIF ANKLE FRACTURE: SHX5408

## 2018-03-14 SURGERY — OPEN REDUCTION INTERNAL FIXATION (ORIF) ANKLE FRACTURE
Anesthesia: Choice | Site: Ankle | Laterality: Left | Wound class: Clean

## 2018-03-14 MED ORDER — FENTANYL CITRATE (PF) 100 MCG/2ML IJ SOLN
INTRAMUSCULAR | Status: AC
  Filled 2018-03-14: qty 2

## 2018-03-14 MED ORDER — SODIUM CHLORIDE FLUSH 0.9 % IV SOLN
INTRAVENOUS | Status: AC
  Filled 2018-03-14: qty 10

## 2018-03-14 MED ORDER — FENTANYL CITRATE (PF) 100 MCG/2ML IJ SOLN
25.0000 ug | INTRAMUSCULAR | Status: DC | PRN
  Administered 2018-03-14 (×4): 25 ug via INTRAVENOUS

## 2018-03-14 MED ORDER — ROPIVACAINE HCL 5 MG/ML IJ SOLN
INTRAMUSCULAR | Status: DC | PRN
  Administered 2018-03-14: 30 mL via EPIDURAL

## 2018-03-14 MED ORDER — BACITRACIN 50000 UNITS IM SOLR
INTRAMUSCULAR | Status: AC
  Filled 2018-03-14: qty 1

## 2018-03-14 MED ORDER — FENTANYL CITRATE (PF) 100 MCG/2ML IJ SOLN
50.0000 ug | Freq: Once | INTRAMUSCULAR | Status: AC
  Administered 2018-03-14: 50 ug via INTRAVENOUS

## 2018-03-14 MED ORDER — MIDAZOLAM HCL 2 MG/2ML IJ SOLN
1.0000 mg | Freq: Once | INTRAMUSCULAR | Status: AC
  Administered 2018-03-14: 1 mg via INTRAVENOUS

## 2018-03-14 MED ORDER — NEOMYCIN-POLYMYXIN B GU 40-200000 IR SOLN: Status: DC | PRN

## 2018-03-14 MED ORDER — FENTANYL CITRATE (PF) 100 MCG/2ML IJ SOLN
INTRAMUSCULAR | Status: DC | PRN
  Administered 2018-03-14: 50 ug via INTRAVENOUS

## 2018-03-14 MED ORDER — BUPIVACAINE HCL (PF) 0.5 % IJ SOLN
INTRAMUSCULAR | Status: DC | PRN
  Administered 2018-03-14: 10 mL

## 2018-03-14 MED ORDER — MIDAZOLAM HCL 2 MG/2ML IJ SOLN
INTRAMUSCULAR | Status: AC
  Filled 2018-03-14: qty 2

## 2018-03-14 MED ORDER — SODIUM CHLORIDE 0.9 % IV SOLN
INTRAVENOUS | Status: DC | PRN
  Administered 2018-03-14: 1000 mL

## 2018-03-14 MED ORDER — ACETAMINOPHEN 500 MG PO TABS
ORAL_TABLET | ORAL | Status: AC
  Filled 2018-03-14: qty 2

## 2018-03-14 MED ORDER — MIDAZOLAM HCL 2 MG/2ML IJ SOLN
INTRAMUSCULAR | Status: AC
  Administered 2018-03-14: 1 mg via INTRAVENOUS
  Filled 2018-03-14: qty 2

## 2018-03-14 MED ORDER — MIDAZOLAM HCL 2 MG/2ML IJ SOLN
INTRAMUSCULAR | Status: DC | PRN
  Administered 2018-03-14: 2 mg via INTRAVENOUS

## 2018-03-14 MED ORDER — FENTANYL CITRATE (PF) 100 MCG/2ML IJ SOLN
INTRAMUSCULAR | Status: AC
  Administered 2018-03-14: 25 ug via INTRAVENOUS
  Filled 2018-03-14: qty 2

## 2018-03-14 MED ORDER — PROPOFOL 10 MG/ML IV BOLUS
INTRAVENOUS | Status: AC
  Filled 2018-03-14: qty 20

## 2018-03-14 MED ORDER — LACTATED RINGERS IV SOLN
INTRAVENOUS | Status: DC
  Administered 2018-03-14 (×2): via INTRAVENOUS

## 2018-03-14 MED ORDER — BUPIVACAINE HCL (PF) 0.5 % IJ SOLN
INTRAMUSCULAR | Status: AC
  Filled 2018-03-14: qty 10

## 2018-03-14 MED ORDER — OXYCODONE HCL 5 MG PO TABS
ORAL_TABLET | ORAL | Status: AC
  Filled 2018-03-14: qty 2

## 2018-03-14 MED ORDER — ONDANSETRON HCL 4 MG/2ML IJ SOLN
INTRAMUSCULAR | Status: DC | PRN
  Administered 2018-03-14: 4 mg via INTRAVENOUS

## 2018-03-14 MED ORDER — DEXAMETHASONE SODIUM PHOSPHATE 10 MG/ML IJ SOLN
INTRAMUSCULAR | Status: DC | PRN
  Administered 2018-03-14: 10 mg via INTRAVENOUS

## 2018-03-14 MED ORDER — BUPIVACAINE-EPINEPHRINE (PF) 0.25% -1:200000 IJ SOLN
INTRAMUSCULAR | Status: AC
  Filled 2018-03-14: qty 30

## 2018-03-14 MED ORDER — OXYCODONE HCL 5 MG PO TABS
5.0000 mg | ORAL_TABLET | ORAL | Status: DC | PRN
  Administered 2018-03-14: 10 mg via ORAL
  Filled 2018-03-14: qty 2

## 2018-03-14 MED ORDER — ROPIVACAINE HCL 5 MG/ML IJ SOLN
INTRAMUSCULAR | Status: AC
  Filled 2018-03-14: qty 30

## 2018-03-14 MED ORDER — ONDANSETRON HCL 4 MG/2ML IJ SOLN: 4.0000 mg | Freq: Once | INTRAMUSCULAR | Status: DC | PRN

## 2018-03-14 MED ORDER — LIDOCAINE HCL (CARDIAC) PF 100 MG/5ML IV SOSY
PREFILLED_SYRINGE | INTRAVENOUS | Status: DC | PRN
  Administered 2018-03-14: 100 mg via INTRAVENOUS

## 2018-03-14 MED ORDER — FENTANYL CITRATE (PF) 100 MCG/2ML IJ SOLN
INTRAMUSCULAR | Status: AC
  Administered 2018-03-14: 50 ug via INTRAVENOUS
  Filled 2018-03-14: qty 2

## 2018-03-14 MED ORDER — CHLORHEXIDINE GLUCONATE 4 % EX LIQD
60.0000 mL | Freq: Once | CUTANEOUS | Status: AC
  Administered 2018-03-14: 4 via TOPICAL

## 2018-03-14 MED ORDER — EPHEDRINE SULFATE 50 MG/ML IJ SOLN
INTRAMUSCULAR | Status: DC | PRN
  Administered 2018-03-14: 10 mg via INTRAVENOUS
  Administered 2018-03-14: 1 mg via INTRAVENOUS
  Administered 2018-03-14: 10 mg via INTRAVENOUS

## 2018-03-14 MED ORDER — OXYCODONE-ACETAMINOPHEN 10-325 MG PO TABS: 1.0000 | ORAL_TABLET | Freq: Four times a day (QID) | ORAL | 0 refills | Status: DC | PRN

## 2018-03-14 MED ORDER — PROPOFOL 10 MG/ML IV BOLUS
INTRAVENOUS | Status: DC | PRN
  Administered 2018-03-14: 130 mg via INTRAVENOUS

## 2018-03-14 MED ORDER — ACETAMINOPHEN 500 MG PO TABS
1000.0000 mg | ORAL_TABLET | Freq: Once | ORAL | Status: AC
  Administered 2018-03-14: 1000 mg via ORAL

## 2018-03-14 MED ORDER — CEFAZOLIN SODIUM-DEXTROSE 2-4 GM/100ML-% IV SOLN
INTRAVENOUS | Status: AC
  Filled 2018-03-14: qty 100

## 2018-03-14 SURGICAL SUPPLY — 49 items
BANDAGE ACE 6X5 VEL STRL LF (GAUZE/BANDAGES/DRESSINGS) ×3 IMPLANT
BIT DRILL 2.5 X LONG (BIT) ×1
BIT DRILL QC 3.5X110 (BIT) ×2 IMPLANT
BIT DRILL X LONG 2.5 (BIT) IMPLANT
BLADE SURG 10 STRL SS SAFETY (BLADE) ×3 IMPLANT
BLADE SURG 15 STRL SS SAFETY (BLADE) ×9 IMPLANT
BNDG COHESIVE 6X5 TAN STRL LF (GAUZE/BANDAGES/DRESSINGS) ×3 IMPLANT
BNDG ESMARK 6X12 TAN STRL LF (GAUZE/BANDAGES/DRESSINGS) ×3 IMPLANT
BRUSH SCRUB EZ  4% CHG (MISCELLANEOUS) ×4
BRUSH SCRUB EZ 4% CHG (MISCELLANEOUS) ×2 IMPLANT
CANISTER SUCT 1200ML W/VALVE (MISCELLANEOUS) ×3 IMPLANT
CHLORAPREP W/TINT 26ML (MISCELLANEOUS) ×6 IMPLANT
DRAPE FLUOR MINI C-ARM 54X84 (DRAPES) ×3 IMPLANT
DRAPE SHEET LG 3/4 BI-LAMINATE (DRAPES) ×3 IMPLANT
DRILL BIT X LONG 2.5 (BIT) ×3
ELECT REM PT RETURN 9FT ADLT (ELECTROSURGICAL) ×3
ELECTRODE REM PT RTRN 9FT ADLT (ELECTROSURGICAL) ×1 IMPLANT
GAUZE PETRO XEROFOAM 1X8 (MISCELLANEOUS) ×6 IMPLANT
GLOVE INDICATOR 8.0 STRL GRN (GLOVE) ×9 IMPLANT
GLOVE SURG ORTHO 8.0 STRL STRW (GLOVE) ×9 IMPLANT
GOWN STRL REUS W/ TWL LRG LVL3 (GOWN DISPOSABLE) ×1 IMPLANT
GOWN STRL REUS W/ TWL XL LVL3 (GOWN DISPOSABLE) ×1 IMPLANT
GOWN STRL REUS W/TWL LRG LVL3 (GOWN DISPOSABLE) ×3
GOWN STRL REUS W/TWL XL LVL3 (GOWN DISPOSABLE) ×3
KIT TURNOVER KIT A (KITS) ×3 IMPLANT
NS IRRIG 1000ML POUR BTL (IV SOLUTION) ×3 IMPLANT
PACK EXTREMITY ARMC (MISCELLANEOUS) ×3 IMPLANT
PAD ABD DERMACEA PRESS 5X9 (GAUZE/BANDAGES/DRESSINGS) ×12 IMPLANT
PAD CAST CTTN 4X4 STRL (SOFTGOODS) ×2 IMPLANT
PADDING CAST COTTON 4X4 STRL (SOFTGOODS) ×6
PLATE LCP 3.5 1/3 TUB 8HX93 (Plate) ×2 IMPLANT
REPAIR TROPE KNTLS SS SYNDESMO (Orthopedic Implant) ×2 IMPLANT
SCREW CORTEX 3.5 12MM (Screw) ×6 IMPLANT
SCREW CORTEX 3.5 16MM (Screw) ×4 IMPLANT
SCREW CORTEX 3.5 18MM (Screw) ×4 IMPLANT
SCREW CORTEX 3.5 24MM (Screw) ×2 IMPLANT
SCREW LOCK CORT ST 3.5X12 (Screw) IMPLANT
SCREW LOCK CORT ST 3.5X16 (Screw) IMPLANT
SCREW LOCK CORT ST 3.5X18 (Screw) IMPLANT
SCREW LOCK CORT ST 3.5X24 (Screw) IMPLANT
SPLINT CAST 1 STEP 5X30 WHT (MISCELLANEOUS) ×3 IMPLANT
STAPLER SKIN PROX 35W (STAPLE) ×3 IMPLANT
SUT VIC AB 2-0 SH 27 (SUTURE) ×3
SUT VIC AB 2-0 SH 27XBRD (SUTURE) ×1 IMPLANT
SUT VIC AB 3-0 SH 27 (SUTURE) ×3
SUT VIC AB 3-0 SH 27X BRD (SUTURE) ×1 IMPLANT
TAPE SURG TRANSPORE 1 IN (GAUZE/BANDAGES/DRESSINGS) ×1 IMPLANT
TAPE SURGICAL TRANSPORE 1 IN (GAUZE/BANDAGES/DRESSINGS) ×2
TOWEL OR 17X26 4PK STRL BLUE (TOWEL DISPOSABLE) ×6 IMPLANT

## 2018-03-14 NOTE — Discharge Instructions (Signed)
Peripheral Nerve Block (Lower Extremity) Discharge Instructions   1.  For your surgery you have received a femoral Nerve Block.  2. Your Nerve Block is expected to last for about 4 to 12 hours.  This is an estimated time frame; the results of your nerve block may wear off sooner or may last longer.  3. If needed, your surgeon will give you a prescription for pain medication.  It will take about 60 minutes for the oral pain medication to become fully effective.  So, it is recommended that you start taking this medication before the nerve block first begins to wear off, or when you first begin to feel discomfort.  4. Keep in mind that nerve blocks often wear off in the middle of the night.   If you are going to bed and the block has not started to wear off or you have not started to have any discomfort, consider setting an alarm for 2 to 3 hours, so you can assess your block.  If you notice the block is wearing off or you are starting to have discomfort, you can take your pain medication.  5. Take your pain medication only as prescribed.  Pain medication can cause sedation and decrease your breathing if you take more than you need for the level of pain that you have.  6. Nausea is a common side effect of many pain medications.  You may want to eat something before taking your pain medicine to prevent nausea.  7. After a Peripheral Nerve block, you cannot feel pain, pressure or extremes in temperature in the effected leg.  Because your leg is numb it is at an increased risk for injury.  To decrease the possibility of injury, please practice the following:  a. While you are awake change the position of your leg frequently to prevent too much pressure on any one area for prolonged periods of time.  b.  If you have a cast or tight dressing, check the color or your toes every couple of hours.  Call your surgeon with the appearance of any discoloration (white or blue).  c. You may have difficulty  bearing weight on the effected leg.  Have someone assist you with walking until the nerve block has completely worn off.   d. If you surgeon prescribed a brace to be worn after surgery, DO NOT GET UP AT NIGHT WITHOUT YOUR BRACE.  e. If your surgeon has restricted the amount of weight you should bear on the effected leg, i.e. No Weight, Partial Weight, or Touch Down Only, DO NOT BEAR MORE WEIGHT THAN INSTRUCTED.   If you experience any problems or concerns, please contact your surgeon.      AMBULATORY SURGERY  DISCHARGE INSTRUCTIONS   1) The drugs that you were given will stay in your system until tomorrow so for the next 24 hours you should not:  A) Drive an automobile B) Make any legal decisions C) Drink any alcoholic beverage   2) You may resume regular meals tomorrow.  Today it is better to start with liquids and gradually work up to solid foods.  You may eat anything you prefer, but it is better to start with liquids, then soup and crackers, and gradually work up to solid foods.   3) Please notify your doctor immediately if you have any unusual bleeding, trouble breathing, redness and pain at the surgery site, drainage, fever, or pain not relieved by medication.    4) Additional Instructions:  Additional Instructions: ° ° ° ° ° ° ° °Please contact your physician with any problems or Same Day Surgery at [phone], Monday through Friday 6 am to 4 pm, or [place] at [place] [facility] number at [phone]. ° ° ° ° °

## 2018-03-14 NOTE — OR Nursing (Signed)
Patient sent to pacu for her block made pacu nurse aware that H&P needs to be completed.

## 2018-03-14 NOTE — H&P (Signed)
The patient has been re-examined, and the chart reviewed, and there have been no interval changes to the documented history and physical.  Plan a left ankle open reduction and internal fixation today.  Anesthesia is consulted regarding a peripheral nerve block for post-operative pain.  The risks, benefits, and alternatives have been discussed at length, and the patient is willing to proceed.     

## 2018-03-14 NOTE — H&P (Signed)
PREOPERATIVE H&P  Chief Complaint: CLOSED TRIMALLEOLAR FRACTURE OF LEFT ANKLE  HPI: Zoe Sanchez is a 62 y.o. female who presents for preoperative history and physical with a diagnosis of CLOSED TRIMALLEOLAR FRACTURE OF LEFT ANKLE. Symptoms are rated as moderate to severe, and have been worsening.  This is significantly impairing activities of daily living.  She has elected for surgical management.   Past Medical History:  Diagnosis Date  . Abnormal heart rhythm   . Abnormal mammogram 02/25/2014  . Anemia    vitamin b12 and vitamin d deficiency  . Anxiety   . Asthma   . Awareness of heartbeats 06/29/2015  . Breath shortness 06/29/2015  . CAD (coronary artery disease)   . Cancer ([place])    skin cancers  . Cervical radicular pain (Location of Secondary source of pain) (Bilateral) (R>L) (C8 Dermatome) 06/29/2015  . Chronic pain   . COPD (chronic obstructive pulmonary disease) ([place])   . Degenerative disk disease   . Depression   . Dysrhythmia    svt  . Endometriosis   . Family history of chronic pain 06/29/2015  . Fibromyalgia   . Fibromyalgia   . GERD (gastroesophageal reflux disease)   . Heart murmur   . Hiatal hernia   . History of attempted suicide 06/29/2015  . History of hiatal hernia 06/29/2015  . History of psychiatric disorder 06/29/2015  . History of suicidal ideation 06/29/2015  . Hyperlipidemia   . Insomnia   . Insomnia   . Irritable bowel syndrome    Past Surgical History:  Procedure Laterality Date  . ABDOMINAL HYSTERECTOMY  1986  . BREAST BIOPSY Right 2016   CORE W/CLIP - EARLY INTRADUCTAL PAPILLOMA  . BREAST SURGERY    . OOPHORECTOMY  1986   same time as hysterectomy  . SKIN LESION EXCISION     basal cell and squamous cell  . TONSILLECTOMY  1957   Social History   Socioeconomic History  . Marital status: Single    Spouse name: Not on file  . Number of children: Not on file  . Years of education: Not on file  . Highest education level: Not on  file  Occupational History  . Not on file  Social Needs  . Financial resource strain: Not on file  . Food insecurity:    Worry: Not on file    Inability: Not on file  . Transportation needs:    Medical: Not on file    Non-medical: Not on file  Tobacco Use  . Smoking status: Never Smoker  . Smokeless tobacco: Never Used  Substance and Sexual Activity  . Alcohol use: No    Alcohol/week: 0.0 oz  . Drug use: No  . Sexual activity: Not on file  Lifestyle  . Physical activity:    Days per week: Not on file    Minutes per session: Not on file  . Stress: Not on file  Relationships  . Social connections:    Talks on phone: Not on file    Gets together: Not on file    Attends religious service: Not on file    Active member of club or organization: Not on file    Attends meetings of clubs or organizations: Not on file    Relationship status: Not on file  Other Topics Concern  . Not on file  Social History Narrative  . Not on file   Family History  Problem Relation Age of Onset  . Heart disease Father   .  Hypertension Father   . Hypertension Mother   . Breast cancer Maternal Aunt    Allergies  Allergen Reactions  . Prednisone Palpitations   Prior to Admission medications   Medication Sig Start Date End Date Taking? Authorizing Provider  amitriptyline (ELAVIL) 100 MG tablet Take 200 mg by mouth at bedtime.  02/17/14  Yes [provider]  Cholecalciferol (VITAMIN D3) 2000 units capsule Take 1 capsule (2,000 Units total) by mouth daily. 02/14/18 05/15/18 Yes Vevelyn Francois, NP  cyanocobalamin ([facility] VITAMIN B12) 2000 MCG tablet Take 1 tablet (2,000 mcg total) by mouth daily. 02/14/18 05/15/18 Yes King, Diona Foley, NP  cyclobenzaprine (FLEXERIL) 5 MG tablet Take 1 tablet (5 mg total) by mouth 4 (four) times daily. 02/14/18 05/15/18 Yes King, Diona Foley, NP  dicyclomine (BENTYL) 20 MG tablet Take 20 mg by mouth daily as needed for spasms.  06/06/16  Yes [provider]   estradiol (ESTRACE) 1 MG tablet Take 1 mg by mouth daily.  05/19/16  Yes [provider]  fluticasone (FLONASE) 50 MCG/ACT nasal spray Place 2 sprays into both nostrils daily.  11/14/16  Yes [provider]  gemfibrozil (LOPID) 600 MG tablet Take 600 mg by mouth 2 (two) times daily before a meal.  02/17/14  Yes [provider]  INCRUSE ELLIPTA 62.5 MCG/INH AEPB Inhale 1 puff into the lungs daily.  02/18/16  Yes [provider]  magnesium gluconate (MAGONATE) 500 MG tablet Take 500 mg by mouth daily.   Yes [provider]  Melatonin 10 MG CAPS Take 20 mg by mouth at bedtime as needed. Patient taking differently: Take 20 mg by mouth at bedtime as needed (sleep).  08/29/17  Yes Vevelyn Francois, NP  metroNIDAZOLE (FLAGYL) 500 MG tablet Take 500 mg by mouth 2 (two) times daily. 01/16/18  Yes [provider]  montelukast (SINGULAIR) 10 MG tablet Take by mouth at bedtime.  02/17/14  Yes [provider]  omeprazole (PRILOSEC) 20 MG capsule Take 20 mg by mouth 2 (two) times daily before a meal.  02/17/14  Yes [provider]  oxyCODONE-acetaminophen (PERCOCET) 7.5-325 MG tablet Take 1 tablet by mouth every 4 (four) hours as needed for severe pain. Patient taking differently: Take 1 tablet by mouth every 6 (six) hours as needed for severe pain.  03/07/18 03/07/19 Yes Earleen Newport, MD  potassium chloride (K-DUR) 10 MEQ tablet Take 10 mEq by mouth daily. 02/27/18 02/27/19 Yes [provider]  pregabalin (LYRICA) 100 MG capsule Take 1 capsule (100 mg total) by mouth at bedtime. 02/18/18 05/19/18 Yes Vevelyn Francois, NP  pregabalin (LYRICA) 25 MG capsule Take 1 capsule (25 mg total) by mouth 2 (two) times daily. Patient taking differently: Take 25 mg by mouth 2 (two) times daily as needed.  02/14/18 03/16/18 Yes King, Diona Foley, NP  propranolol (INDERAL) 40 MG tablet TAKE 40 - 280MG  BY MOUTH DAILY AS NEEDED 04/17/17  Yes [provider]   propranolol ER (INDERAL LA) 80 MG 24 hr capsule TAKE 2 CAPSULES BY MOUTH ONCE DAILY at bedtime 09/20/16  Yes [provider]  salmeterol (SEREVENT DISKUS) 50 MCG/DOSE diskus inhaler Inhale 1 puff into the lungs daily.    Yes [provider]  zolpidem (AMBIEN) 10 MG tablet Take 10 mg by mouth at bedtime.  03/31/17  Yes [provider]  furosemide (LASIX) 20 MG tablet Take 20 mg by mouth daily as needed for edema. As needed for swelling. (patient prefers not  to take this)    [provider]  HYDROcodone-acetaminophen (NORCO/VICODIN) 5-325 MG tablet Take 1 tablet by mouth every 4 (four) hours as needed for severe pain. 04/19/18 05/19/18  Vevelyn Francois, NP  HYDROcodone-acetaminophen (NORCO/VICODIN) 5-325 MG tablet Take 1 tablet by mouth every 4 (four) hours as needed for severe pain. 03/20/18 04/19/18  Vevelyn Francois, NP  HYDROcodone-acetaminophen (NORCO/VICODIN) 5-325 MG tablet Take 1 tablet by mouth every 4 (four) hours as needed for severe pain. 02/18/18 03/20/18  Vevelyn Francois, NP     Positive ROS: All other systems have been reviewed and were otherwise negative with the exception of those mentioned in the HPI and as above.  Physical Exam: General: Alert, no acute distress Cardiovascular: Regular rate and rhythm, no murmurs rubs or gallops.  No pedal edema Respiratory: Clear to auscultation bilaterally, no wheezes rales or rhonchi. No cyanosis, no use of accessory musculature GI: No organomegaly, abdomen is soft and non-tender nondistended with positive bowel sounds. Skin: Skin intact, no lesions within the operative field. Neurologic: Sensation intact distally Psychiatric: Patient is competent for consent with normal mood and affect Lymphatic: No axillary or cervical lymphadenopathy  MUSCULOSKELETAL: left ankle with swelling and tenderness both medial and lateral, skin is intact  Assessment: CLOSED TRIMALLEOLAR FRACTURE OF LEFT ANKLE  Plan: Plan for  Procedure(s): OPEN REDUCTION INTERNAL FIXATION (ORIF) ANKLE FRACTURE  I discussed the risks and benefits of surgery. The risks include but are not limited to infection, bleeding requiring blood transfusion, nerve or blood vessel injury, joint stiffness or loss of motion, persistent pain, weakness or instability, malunion, nonunion and hardware failure and the need for further surgery. Medical risks include but are not limited to DVT and pulmonary embolism, myocardial infarction, stroke, pneumonia, respiratory failure and death. Patient understood these risks and wished to proceed.   I will be providing the patient's post-operative pain medications.  Lovell Sheehan, MD   03/14/2018 5:56 PM

## 2018-03-14 NOTE — Anesthesia Preprocedure Evaluation (Signed)
Anesthesia Evaluation  Patient identified by MRN, date of birth, ID band Patient awake    Reviewed: Allergy & Precautions, NPO status , Patient's Chart, lab work & pertinent test results, reviewed documented beta blocker date and time   Airway Mallampati: III  TM Distance: >3 FB     Dental  (+) Chipped   Pulmonary asthma , COPD,           Cardiovascular + CAD  + dysrhythmias Supra Ventricular Tachycardia      Neuro/Psych PSYCHIATRIC DISORDERS Anxiety Depression  Neuromuscular disease    GI/Hepatic hiatal hernia, GERD  Controlled,  Endo/Other    Renal/GU      Musculoskeletal  (+) Arthritis , Fibromyalgia -  Abdominal   Peds  Hematology  (+) anemia ,   Anesthesia Other Findings   Reproductive/Obstetrics                             Anesthesia Physical Anesthesia Plan  ASA: III  Anesthesia Plan:    Post-op Pain Management:    Induction:   PONV Risk Score and Plan:   Airway Management Planned:   Additional Equipment:   Intra-op Plan:   Post-operative Plan:   Informed Consent: I have reviewed the patients History and Physical, chart, labs and discussed the procedure including the risks, benefits and alternatives for the proposed anesthesia with the patient or authorized representative who has indicated his/her understanding and acceptance.     Plan Discussed with: CRNA  Anesthesia Plan Comments:         Anesthesia Quick Evaluation

## 2018-03-14 NOTE — Anesthesia Post-op Follow-up Note (Signed)
Anesthesia QCDR form completed.        

## 2018-03-14 NOTE — Op Note (Signed)
  03/14/2018  8:00 PM  PATIENT:  Zoe Sanchez  62 y.o. female  PRE-OPERATIVE DIAGNOSIS:  CLOSED BIMALLEOLAR FRACTURE OF LEFT ANKLE  POST-OPERATIVE DIAGNOSIS:  Same  PROCEDURE:  OPEN REDUCTION INTERNAL FIXATION (ORIF) ANKLE FRACTURE  SURGEON:  Kurtis Bushman, MD  ASST:  None  ANESTHESIA:   General  EBL:  20 mL  TOURNIQUET TIME:  45 min @ 250 mmHg  OPERATIVE FINDINGS:  Displaced fibula fracture with widening of the medial clear space, deltoid ligament disruption, posterior malleolus fracture and widening of the syndesmosis with stress views of the left ankle.  OPERATIVE PROCEDURE:   The patient was brought to the operating room and placed in the supine position. All bony prominences were padded. General anesthesia was administered. The lower extremity was prepped and draped in the usual sterile fashion. The leg was elevated and exsanguinated and the tourniquet was inflated. Time out was performed.   Incision was made over the distal fibula and the fracture was exposed and reduced anatomically with a clamp. A lag screw was placed. I then applied a synthes 1/3 tubular plate and secured it proximally and distally with cortical screws.  I used the Fluoroscan to confirm satisfactory reduction and fixation.   I then turned my attention to the syndesmosis. A tightRope fixation was placed from lateral to medial through the 1/3 tubular plate.  Fluoroscopy showed good reduction and hardware placement.   The syndesmosis was stressed using live fluoroscopy and found to be stable upon completion of the procedure.  The medial and lateral wounds were irrigated, and closed with Vicryl and staples. Sponge and needle counts were correct. Sterile gauze was applied followed by a posterior splint. She was awakened and returned to the PACU in stable and satisfactory condition. There were no apparent complications.  Kurtis Bushman, MD

## 2018-03-14 NOTE — Anesthesia Procedure Notes (Signed)
Anesthesia Regional Block: Popliteal block   Pre-Anesthetic Checklist: ,, timeout performed, Correct Patient, Correct Site, Correct Laterality, Correct Procedure, Correct Position, site marked, Risks and benefits discussed,  Surgical consent,  Pre-op evaluation,  At surgeon's request and post-op pain management  Laterality: Lower  Prep: chloraprep       Needles:  Injection technique: Single-shot  Needle Type: Echogenic Needle     Needle Length: 9cm  Needle Gauge: 21     Additional Needles:   Procedures:,,,, ultrasound used (permanent image in chart),,,,  Narrative:  Injection made incrementally with aspirations every 5 mL.  Performed by: Personally  Anesthesiologist: Piscitello, Precious Haws, MD  Additional Notes: Functioning IV was confirmed and monitors were applied.  A echogenic needle was used. Sterile prep,hand hygiene and sterile gloves were used. Minimal sedation used for procedure.   No paresthesia endorsed by patient during the procedure.  Negative aspiration and negative test dose prior to incremental administration of local anesthetic. The patient tolerated the procedure well with no immediate complications.

## 2018-03-14 NOTE — Anesthesia Procedure Notes (Signed)
Procedure Name: LMA Insertion Date/Time: 03/14/2018 6:43 PM Performed by: Nelda Marseille, CRNA Pre-anesthesia Checklist: Patient identified, Patient being monitored, Timeout performed, Emergency Drugs available and Suction available Patient Re-evaluated:Patient Re-evaluated prior to induction Oxygen Delivery Method: Circle system utilized Preoxygenation: Pre-oxygenation with 100% oxygen Induction Type: IV induction Ventilation: Mask ventilation without difficulty LMA: LMA inserted LMA Size: 3.5 Tube type: Oral Number of attempts: 1 Placement Confirmation: positive ETCO2 and breath sounds checked- equal and bilateral Tube secured with: Tape Dental Injury: Teeth and Oropharynx as per pre-operative assessment

## 2018-03-14 NOTE — Transfer of Care (Signed)
Immediate Anesthesia Transfer of Care Note  Patient: Zoe Sanchez  Procedure(s) Performed: OPEN REDUCTION INTERNAL FIXATION (ORIF) ANKLE FRACTURE (Left Ankle)  Patient Location: PACU  Anesthesia Type:General  Level of Consciousness: awake and sedated  Airway & Oxygen Therapy: Patient Spontanous Breathing and Patient connected to face mask oxygen  Post-op Assessment: Report given to RN and Post -op Vital signs reviewed and stable  Post vital signs: Reviewed and stable  Last Vitals:  Vitals Value Taken Time  BP    Temp    Pulse 69 03/14/2018  7:48 PM  Resp 18 03/14/2018  7:48 PM  SpO2 99 % 03/14/2018  7:48 PM  Vitals shown include unvalidated device data.  Last Pain:  Vitals:   03/14/18 1634  TempSrc:   PainSc: 0-No pain         Complications: No apparent anesthesia complications

## 2018-03-15 NOTE — Anesthesia Postprocedure Evaluation (Signed)
Anesthesia Post Note  Patient: Zoe Sanchez  Procedure(s) Performed: OPEN REDUCTION INTERNAL FIXATION (ORIF) ANKLE FRACTURE (Left Ankle)  Patient location during evaluation: PACU Anesthesia Type: General Level of consciousness: awake and alert Pain management: pain level controlled Vital Signs Assessment: post-procedure vital signs reviewed and stable Respiratory status: spontaneous breathing, nonlabored ventilation, respiratory function stable and patient connected to nasal cannula oxygen Cardiovascular status: blood pressure returned to baseline and stable Postop Assessment: no apparent nausea or vomiting Anesthetic complications: no     Last Vitals:  Vitals:   03/14/18 2049 03/14/18 2120  BP: 135/72 134/70  Pulse: 71 72  Resp: 12 20  Temp: 36.5 C   SpO2: 100% 100%    Last Pain:  Vitals:   03/14/18 2120  TempSrc:   PainSc: 2                  [name] S

## 2018-03-16 ENCOUNTER — Encounter: Payer: Self-pay | Admitting: Orthopedic Surgery

## 2018-03-29 ENCOUNTER — Other Ambulatory Visit: Payer: Self-pay | Admitting: Orthopedic Surgery

## 2018-03-29 ENCOUNTER — Ambulatory Visit
Admission: RE | Admit: 2018-03-29 | Discharge: 2018-03-29 | Disposition: A | Discharge status: Home / Self Care | Payer: Medicare Other | Source: Ambulatory Visit | Attending: Orthopedic Surgery | Admitting: Orthopedic Surgery

## 2018-03-29 DIAGNOSIS — S8252XA Displaced fracture of medial malleolus of left tibia, initial encounter for closed fracture: Secondary | ICD-10-CM | POA: Diagnosis not present

## 2018-03-29 DIAGNOSIS — S9303XA Subluxation of unspecified ankle joint, initial encounter: Secondary | ICD-10-CM | POA: Diagnosis not present

## 2018-03-29 DIAGNOSIS — M25572 Pain in left ankle and joints of left foot: Secondary | ICD-10-CM | POA: Insufficient documentation

## 2018-03-29 DIAGNOSIS — Z9181 History of falling: Secondary | ICD-10-CM | POA: Diagnosis not present

## 2018-05-10 ENCOUNTER — Other Ambulatory Visit: Payer: Self-pay

## 2018-05-10 ENCOUNTER — Encounter: Payer: Self-pay | Admitting: Nurse Practitioner

## 2018-05-10 ENCOUNTER — Ambulatory Visit: Payer: Medicare Other | Attending: Nurse Practitioner | Admitting: Nurse Practitioner

## 2018-05-10 VITALS — BP 107/53 | HR 73 | Temp 98.2°F | Resp 16 | Ht 62.5 in | Wt 157.0 lb

## 2018-05-10 DIAGNOSIS — J449 Chronic obstructive pulmonary disease, unspecified: Secondary | ICD-10-CM | POA: Insufficient documentation

## 2018-05-10 DIAGNOSIS — M542 Cervicalgia: Secondary | ICD-10-CM | POA: Diagnosis present

## 2018-05-10 DIAGNOSIS — M79602 Pain in left arm: Secondary | ICD-10-CM | POA: Insufficient documentation

## 2018-05-10 DIAGNOSIS — K219 Gastro-esophageal reflux disease without esophagitis: Secondary | ICD-10-CM | POA: Insufficient documentation

## 2018-05-10 DIAGNOSIS — F411 Generalized anxiety disorder: Secondary | ICD-10-CM | POA: Insufficient documentation

## 2018-05-10 DIAGNOSIS — F99 Mental disorder, not otherwise specified: Secondary | ICD-10-CM | POA: Diagnosis not present

## 2018-05-10 DIAGNOSIS — M545 Low back pain: Secondary | ICD-10-CM | POA: Diagnosis present

## 2018-05-10 DIAGNOSIS — M7918 Myalgia, other site: Secondary | ICD-10-CM | POA: Insufficient documentation

## 2018-05-10 DIAGNOSIS — G5602 Carpal tunnel syndrome, left upper limb: Secondary | ICD-10-CM | POA: Diagnosis not present

## 2018-05-10 DIAGNOSIS — M47816 Spondylosis without myelopathy or radiculopathy, lumbar region: Secondary | ICD-10-CM | POA: Diagnosis not present

## 2018-05-10 DIAGNOSIS — M79601 Pain in right arm: Secondary | ICD-10-CM | POA: Diagnosis not present

## 2018-05-10 DIAGNOSIS — G894 Chronic pain syndrome: Secondary | ICD-10-CM | POA: Diagnosis not present

## 2018-05-10 DIAGNOSIS — M47812 Spondylosis without myelopathy or radiculopathy, cervical region: Secondary | ICD-10-CM | POA: Diagnosis not present

## 2018-05-10 DIAGNOSIS — Z79891 Long term (current) use of opiate analgesic: Secondary | ICD-10-CM | POA: Insufficient documentation

## 2018-05-10 DIAGNOSIS — M546 Pain in thoracic spine: Secondary | ICD-10-CM | POA: Diagnosis present

## 2018-05-10 DIAGNOSIS — I2510 Atherosclerotic heart disease of native coronary artery without angina pectoris: Secondary | ICD-10-CM | POA: Insufficient documentation

## 2018-05-10 DIAGNOSIS — E538 Deficiency of other specified B group vitamins: Secondary | ICD-10-CM | POA: Diagnosis not present

## 2018-05-10 DIAGNOSIS — M797 Fibromyalgia: Secondary | ICD-10-CM | POA: Diagnosis not present

## 2018-05-10 DIAGNOSIS — M50223 Other cervical disc displacement at C6-C7 level: Secondary | ICD-10-CM | POA: Insufficient documentation

## 2018-05-10 DIAGNOSIS — I471 Supraventricular tachycardia: Secondary | ICD-10-CM | POA: Insufficient documentation

## 2018-05-10 DIAGNOSIS — Z915 Personal history of self-harm: Secondary | ICD-10-CM | POA: Diagnosis not present

## 2018-05-10 DIAGNOSIS — E559 Vitamin D deficiency, unspecified: Secondary | ICD-10-CM

## 2018-05-10 DIAGNOSIS — K589 Irritable bowel syndrome without diarrhea: Secondary | ICD-10-CM | POA: Insufficient documentation

## 2018-05-10 MED ORDER — CYCLOBENZAPRINE HCL 5 MG PO TABS: 5.0000 mg | ORAL_TABLET | Freq: Four times a day (QID) | ORAL | 2 refills | Status: DC

## 2018-05-10 MED ORDER — HYDROCODONE-ACETAMINOPHEN 5-325 MG PO TABS: 1.0000 | ORAL_TABLET | ORAL | 0 refills | Status: DC | PRN

## 2018-05-10 MED ORDER — CYANOCOBALAMIN 2000 MCG PO TABS: 2000.0000 ug | ORAL_TABLET | Freq: Every day | ORAL | 2 refills | Status: DC

## 2018-05-10 MED ORDER — MELATONIN 10 MG PO CAPS: 20.0000 mg | ORAL_CAPSULE | Freq: Every evening | ORAL | 2 refills | Status: DC | PRN

## 2018-05-10 MED ORDER — PREGABALIN 100 MG PO CAPS: 100.0000 mg | ORAL_CAPSULE | Freq: Every day | ORAL | 2 refills | Status: DC

## 2018-05-10 MED ORDER — PREGABALIN 25 MG PO CAPS: 25.0000 mg | ORAL_CAPSULE | Freq: Two times a day (BID) | ORAL | 2 refills | Status: DC

## 2018-05-10 MED ORDER — VITAMIN D3 50 MCG (2000 UT) PO CAPS: 2000.0000 [IU] | ORAL_CAPSULE | Freq: Every day | ORAL | 2 refills | Status: DC

## 2018-05-10 NOTE — Progress Notes (Signed)
Nursing Pain Medication Assessment:  Safety precautions to be maintained throughout the outpatient stay will include: orient to surroundings, keep bed in low position, maintain call bell within reach at all times, provide assistance with transfer out of bed and ambulation.  Medication Inspection Compliance: Pill count conducted under aseptic conditions, in front of the patient. Neither the pills nor the bottle was removed from the patient's sight at any time. Once count was completed pills were immediately returned to the patient in their original bottle.  Medication: Hydrocodone/APAP Pill/Patch Count: 131 of 180 pills remain Pill/Patch Appearance: Markings consistent with prescribed medication Bottle Appearance: Standard pharmacy container. Clearly labeled. Filled Date: 8 / 79 / 2019 Last Medication intake:  Today

## 2018-05-10 NOTE — Progress Notes (Signed)
Patient's Name: Zoe Sanchez  MRN: [id number]  Referring Provider: Denton Lank, MD  DOB: [birth year]  PCP: Denton Lank, MD  DOS: 05/10/2018  Note by: Vevelyn Francois NP  Service setting: Ambulatory outpatient  Specialty: Interventional Pain Management  Location: [facility] (AMB) Pain Management Facility    Patient type: Established    Primary Reason(s) for Visit: Encounter for prescription drug management. (Level of risk: moderate)  CC: Back Pain (lower, upper) and Neck Pain  HPI  Zoe Sanchez is a 62 y.o. year old, female patient, who comes today for a medication management evaluation. She has Microcalcifications of the breast; Encounter for therapeutic drug level monitoring; Long term current use of opiate analgesic; Opiate use (30 MME/Day); Magnesium deficiency; Vitamin B12 deficiency; Myofascial pain syndrome; Cervical facet syndrome (Bilateral) (R>L); Chronic neck pain (Primary Source of Pain) (Bilateral) (R>L); Chronic pain syndrome; Cervical spondylosis; Anxiety; Paroxysmal supraventricular tachycardia ([place]); Syncope and collapse; Fibromyalgia; Cervical herniated disc (C5-6 and C6-7); Cervical foraminal stenosis (Bilateral) (C5-6); Chronic cervical radicular pain (Bilateral) (R>L) (C8 Dermatome); Chronic low back pain [facility] source of pain) (Bilateral) (R>L); Lumbar spondylosis; Coronary atherosclerosis of native coronary artery; Generalized anxiety disorder; Intermittent urinary incontinence; History of psychiatric disorder; Depression; Family history of chronic pain; History of attempted suicide; History of suicidal ideation; Irritable bowel syndrome; Intermittent diarrhea; Chronic constipation; Insomnia secondary to chronic pain; History of hiatal hernia; Long term prescription opiate use; Vitamin D insufficiency; Opioid-induced constipation (OIC); Carpal tunnel syndrome (Left); Claustrophobia; Chronic shoulder pain (Bilateral); Cervical facet arthropathy; Lumbar facet arthropathy; Bimalleolar  fracture of left ankle; Lumbar facet syndrome (Bilateral) (R>L); Chronic upper extremity pain (Secondary source of pain) (Bilateral) (R>L); L4-5 Lumbar Facet joint Synovial cyst; Chronic lower extremity pain (Bilateral) (R>L); and Chronic lumbar radicular pain (Bilateral) (S1) on their problem list. Her primarily concern today is the Back Pain (lower, upper) and Neck Pain  Pain Assessment: Location: Lower, Upper Back Radiating: into neck Onset: More than a month ago Duration: Chronic pain Quality: Aching, Constant Severity: 4 /10 (subjective, self-reported pain score)  Note: Reported level is compatible with observation.                          Timing: Constant Modifying factors: meds, hot showers BP: (!) 107/53  HR: 73  Zoe Sanchez was last scheduled for an appointment on 02/14/2018 for medication management. During today's appointment we reviewed Zoe Sanchez's chronic pain status, as well as her outpatient medication regimen.  She admits that her pain is stable.  She did suffer a fall going up steps and fractured her left ankle in June.  She feels like this has affected her lower back having to lie in bed more.  The patient  reports that she does not use drugs. Her body mass index is 28.26 kg/m.  Further details on both, my assessment(s), as well as the proposed treatment plan, please see below.  Controlled Substance Pharmacotherapy Assessment REMS (Risk Evaluation and Mitigation Strategy)  Analgesic:Hydrocodone/APAP 5/325 one every4 hours (6 per day) (30 mg/day) MME/day:30 mg/day. Rise Patience, RN  05/10/2018  1:13 PM  Signed Nursing Pain Medication Assessment:  Safety precautions to be maintained throughout the outpatient stay will include: orient to surroundings, keep bed in low position, maintain call bell within reach at all times, provide assistance with transfer out of bed and ambulation.  Medication Inspection Compliance: Pill count conducted under aseptic conditions, in  front of the patient. Neither the pills nor the bottle was removed  from the patient's sight at any time. Once count was completed pills were immediately returned to the patient in their original bottle.  Medication: Hydrocodone/APAP Pill/Patch Count: 131 of 180 pills remain Pill/Patch Appearance: Markings consistent with prescribed medication Bottle Appearance: Standard pharmacy container. Clearly labeled. Filled Date: 8 / 56 / 2019 Last Medication intake:  Today   Pharmacokinetics: Liberation and absorption (onset of action): WNL Distribution (time to peak effect): WNL Metabolism and excretion (duration of action): WNL         Pharmacodynamics: Desired effects: Analgesia: Zoe Sanchez reports >50% benefit. Functional ability: Patient reports that medication allows her to accomplish basic ADLs Clinically meaningful improvement in function (CMIF): Sustained CMIF goals met Perceived effectiveness: Described as relatively effective, allowing for increase in activities of daily living (ADL) Undesirable effects: Side-effects or Adverse reactions: None reported Monitoring: [place] PMP: Online review of the past 45-monthperiod conducted. Compliant with practice rules and regulations Last UDS on record: Summary  Date Value Ref Range Status  11/20/2017 FINAL  Final    Comment:    ==================================================================== TOXASSURE SELECT 13 (MW) ==================================================================== Test                             Result       Flag       Units Drug Present and Declared for Prescription Verification   Hydrocodone                    3143         EXPECTED   ng/mg creat   Hydromorphone                  328          EXPECTED   ng/mg creat   Dihydrocodeine                 834          EXPECTED   ng/mg creat   Norhydrocodone                 1868         EXPECTED   ng/mg creat    Sources of hydrocodone include scheduled prescription     medications. Hydromorphone, dihydrocodeine and norhydrocodone are    expected metabolites of hydrocodone. Hydromorphone and    dihydrocodeine are also available as scheduled prescription    medications. ==================================================================== Test                      Result    Flag   Units      Ref Range   Creatinine              47               mg/dL      >=20 ==================================================================== Declared Medications:  The flagging and interpretation on this report are based on the  following declared medications.  Unexpected results may arise from  inaccuracies in the declared medications.  **Note: The testing scope of this panel includes these medications:  Hydrocodone (Norco)  **Note: The testing scope of this panel does not include following  reported medications:  Acetaminophen (Norco)  Amitriptyline  Cholecalciferol  Cyanocobalamin  Desonide  Dicyclomine  Estradiol  Fluticasone  Gemfibrozil (Lopid)  Melatonin  Montelukast (Singulair)  Omeprazole (Prilosec)  Pregabalin (Lyrica)  Propranolol (Inderal)  Salmeterol (Serevent)  Umeclidinium (Incruse Ellipta)  Zolpidem (Ambien) ====================================================================  For clinical consultation, please call [phone]. ====================================================================    UDS interpretation: Compliant          Medication Assessment Form: Reviewed. Patient indicates being compliant with therapy Treatment compliance: Compliant Risk Assessment Profile: Aberrant behavior: See prior evaluations. None observed or detected today Comorbid factors increasing risk of overdose: See prior notes. No additional risks detected today Opioid risk tool (ORT) (Total Score): 1 Personal History of Substance Abuse (SUD-Substance use disorder):  Alcohol: Negative  Illegal Drugs: Negative  Rx Drugs: Negative  ORT Risk Level  calculation: Low Risk Risk of substance use disorder (SUD): Low Opioid Risk Tool - 05/10/18 1308      Family History of Substance Abuse   Alcohol  Negative    Illegal Drugs  Negative    Rx Drugs  Negative      Personal History of Substance Abuse   Alcohol  Negative    Illegal Drugs  Negative    Rx Drugs  Negative      Age   Age between 85-45 years   No      Psychological Disease   Psychological Disease  Negative    Depression  Positive      Total Score   Opioid Risk Tool Scoring  1    Opioid Risk Interpretation  Low Risk      ORT Scoring interpretation table:  Score <3 = Low Risk for SUD  Score between 4-7 = Moderate Risk for SUD  Score >8 = High Risk for Opioid Abuse   Risk Mitigation Strategies:  Patient Counseling: Covered Patient-Prescriber Agreement (PPA): Present and active  Notification to other healthcare providers: Done  Pharmacologic Plan: No change in therapy, at this time.             Laboratory Chemistry  Inflammation Markers (CRP: Acute Phase) (ESR: Chronic Phase) Lab Results  Component Value Date   ESRSEDRATE 26 09/28/2015                         Rheumatology Markers No results found for: RF, ANA, LABURIC, URICUR, LYMEIGGIGMAB, LYMEABIGMQN, HLAB27                      Renal Function Markers Lab Results  Component Value Date   BUN 19 03/07/2018   CREATININE 0.91 03/07/2018   GFRAA >60 03/07/2018   GFRNONAA >60 03/07/2018                             Hepatic Function Markers Lab Results  Component Value Date   AST 32 03/07/2018   ALT 22 03/07/2018   ALBUMIN 3.9 03/07/2018   ALKPHOS 62 03/07/2018                        Electrolytes Lab Results  Component Value Date   NA 137 03/07/2018   K 4.1 03/07/2018   CL 100 03/07/2018   CALCIUM 9.0 03/07/2018   MG 1.8 03/09/2016                        Neuropathy Markers Lab Results  Component Value Date   LNLGXQJJ94 174 (H) 03/09/2016                        Bone Pathology Markers Lab  Results  Component Value Date   08XKGYJE5 63  03/09/2016   25OHVITD2 <1.0 03/09/2016   25OHVITD3 48 03/09/2016                         Coagulation Parameters Lab Results  Component Value Date   INR 0.9 03/06/2012   LABPROT 12.8 03/06/2012   PLT 333 03/07/2018                        Cardiovascular Markers Lab Results  Component Value Date   CKTOTAL 57 11/09/2012   CKMB < 0.5 (L) 11/09/2012   TROPONINI < 0.02 11/09/2012   HGB 12.1 03/07/2018   HCT 34.7 (L) 03/07/2018                         CA Markers No results found for: CEA, CA125, LABCA2                      Note: Lab results reviewed.  Recent Diagnostic Imaging Results  CT ANKLE LEFT WO CONTRAST CLINICAL DATA:  Status post fall 2.5 weeks ago.  EXAM: CT OF THE LEFT ANKLE WITHOUT CONTRAST  TECHNIQUE: Multidetector CT imaging of the left ankle was performed according to the standard protocol. Multiplanar CT image reconstructions were also generated.  COMPARISON:  None.  FINDINGS: Bones/Joint/Cartilage  Oblique distal fibular metaphyseal fracture transfixed with a sideplate and multiple interlocking screws in near anatomic alignment with minimal posterior displacement measuring 2 mm.  Medial malleolar fracture with the tip of the medial malleolus displaced distally by 6 mm. Minimally displaced posterior malleolar fracture with 3 mm of posterior displacement.  2 mm lateral subluxation of the talar dome relative to the tibial plafond and. Severe narrowing of the lateral aspect of the tibiotalar joint.  No other fracture or dislocation. Mild osteoarthritis of the talonavicular joint. Subtalar joints are normal. Plantar calcaneal spur. Enthesopathic changes of the Achilles tendon insertion. Moderate joint effusion.  Ligaments  Ligaments are suboptimally evaluated by CT.  Muscles and Tendons Muscles are normal. Flexor, extensor, peroneal and Achilles tendons are grossly intact.  Soft tissue No fluid  collection or hematoma. No soft tissue mass. Generalized soft tissue swelling around the ankle.  IMPRESSION: Oblique distal fibular metaphyseal fracture transfixed with a sideplate and multiple interlocking screws in near anatomic alignment with minimal posterior displacement measuring 2 mm.  Medial malleolar fracture with the tip of the medial malleolus displaced distally by 6 mm. Minimally displaced posterior malleolar fracture with 3 mm of posterior displacement.  2 mm lateral subluxation of the talar dome relative to the tibial plafond and. Severe narrowing of the lateral aspect of the tibiotalar joint.  Electronically Signed   By: Kathreen Devoid   On: 03/29/2018 13:28  Complexity Note: Imaging results reviewed. Results shared with Zoe Sanchez, using Layman's terms.                         Meds   Current Outpatient Medications:  .  amitriptyline (ELAVIL) 100 MG tablet, Take 200 mg by mouth at bedtime. , Disp: , Rfl:  .  Cholecalciferol (VITAMIN D3) 2000 units capsule, Take 1 capsule (2,000 Units total) by mouth daily., Disp: 30 capsule, Rfl: 2 .  cyanocobalamin ([employer] VITAMIN B12) 2000 MCG tablet, Take 1 tablet (2,000 mcg total) by mouth daily., Disp: 30 tablet, Rfl: 2 .  cyclobenzaprine (FLEXERIL) 5 MG tablet, Take 1 tablet (5 mg total)  by mouth 4 (four) times daily., Disp: 120 tablet, Rfl: 2 .  dicyclomine (BENTYL) 20 MG tablet, Take 20 mg by mouth daily as needed for spasms. , Disp: , Rfl:  .  estradiol (ESTRACE) 1 MG tablet, Take 1 mg by mouth daily. , Disp: , Rfl:  .  fluticasone (FLONASE) 50 MCG/ACT nasal spray, Place 2 sprays into both nostrils daily. , Disp: , Rfl:  .  gemfibrozil (LOPID) 600 MG tablet, Take 600 mg by mouth 2 (two) times daily before a meal. , Disp: , Rfl:  .  HYDROcodone-acetaminophen (NORCO/VICODIN) 5-325 MG tablet, Take 1 tablet by mouth every 4 (four) hours as needed for moderate pain., Disp: , Rfl:  .  INCRUSE ELLIPTA 62.5 MCG/INH AEPB, Inhale 1 puff  into the lungs daily. , Disp: , Rfl:  .  magnesium gluconate (MAGONATE) 500 MG tablet, Take 500 mg by mouth daily., Disp: , Rfl:  .  Melatonin 10 MG CAPS, Take 20 mg by mouth at bedtime as needed., Disp: 60 capsule, Rfl: 2 .  metroNIDAZOLE (FLAGYL) 500 MG tablet, Take 500 mg by mouth 2 (two) times daily., Disp: , Rfl:  .  montelukast (SINGULAIR) 10 MG tablet, Take by mouth at bedtime. , Disp: , Rfl:  .  omeprazole (PRILOSEC) 20 MG capsule, Take 20 mg by mouth 2 (two) times daily before a meal. , Disp: , Rfl:  .  pregabalin (LYRICA) 100 MG capsule, Take 1 capsule (100 mg total) by mouth at bedtime., Disp: 30 capsule, Rfl: 2 .  propranolol (INDERAL) 40 MG tablet, TAKE 40 - 280MG BY MOUTH DAILY AS NEEDED, Disp: , Rfl:  .  propranolol ER (INDERAL LA) 80 MG 24 hr capsule, TAKE 2 CAPSULES BY MOUTH ONCE DAILY at bedtime, Disp: , Rfl:  .  salmeterol (SEREVENT DISKUS) 50 MCG/DOSE diskus inhaler, Inhale 1 puff into the lungs daily. , Disp: , Rfl:  .  zolpidem (AMBIEN) 10 MG tablet, Take 10 mg by mouth at bedtime. , Disp: , Rfl:  .  [START ON 07/29/2018] HYDROcodone-acetaminophen (NORCO/VICODIN) 5-325 MG tablet, Take 1 tablet by mouth every 4 (four) hours as needed for severe pain., Disp: 180 tablet, Rfl: 0 .  [START ON 06/29/2018] HYDROcodone-acetaminophen (NORCO/VICODIN) 5-325 MG tablet, Take 1 tablet by mouth every 4 (four) hours as needed for severe pain., Disp: 180 tablet, Rfl: 0 .  [START ON 05/30/2018] HYDROcodone-acetaminophen (NORCO/VICODIN) 5-325 MG tablet, Take 1 tablet by mouth every 4 (four) hours as needed for severe pain., Disp: 180 tablet, Rfl: 0 .  pregabalin (LYRICA) 25 MG capsule, Take 1 capsule (25 mg total) by mouth 2 (two) times daily., Disp: 45 capsule, Rfl: 2  ROS  Constitutional: Denies any fever or chills Gastrointestinal: No reported hemesis, hematochezia, vomiting, or acute GI distress Musculoskeletal: Denies any acute onset joint swelling, redness, loss of ROM, or  weakness Neurological: No reported episodes of acute onset apraxia, aphasia, dysarthria, agnosia, amnesia, paralysis, loss of coordination, or loss of consciousness  Allergies  Zoe Sanchez is allergic to prednisone.  PFSH  Drug: Zoe Sanchez  reports that she does not use drugs. Alcohol:  reports that she does not drink alcohol. Tobacco:  reports that she has never smoked. She has never used smokeless tobacco. Medical:  has a past medical history of Abnormal heart rhythm, Abnormal mammogram (02/25/2014), Anemia, Anxiety, Asthma, Awareness of heartbeats (06/29/2015), Breath shortness (06/29/2015), CAD (coronary artery disease), Cancer (HCC), Cervical radicular pain (Location of Secondary source of pain) (Bilateral) (R>L) (C8 Dermatome) (06/29/2015), Chronic  pain, COPD (chronic obstructive pulmonary disease) ([place]), Degenerative disk disease, Depression, Dysrhythmia, Endometriosis, Family history of chronic pain (06/29/2015), Fibromyalgia, Fibromyalgia, GERD (gastroesophageal reflux disease), Heart murmur, Hiatal hernia, History of attempted suicide (06/29/2015), History of hiatal hernia (06/29/2015), History of psychiatric disorder (06/29/2015), History of suicidal ideation (06/29/2015), Hyperlipidemia, Insomnia, Insomnia, and Irritable bowel syndrome. Surgical: Ms. Nobel  has a past surgical history that includes Abdominal hysterectomy (1986); Tonsillectomy (1957); Skin lesion excision; Oophorectomy (1986); Breast surgery; Breast biopsy (Right, 2016); and ORIF ankle fracture (Left, 03/14/2018). Family: family history includes Breast cancer in her maternal aunt; Heart disease in her father; Hypertension in her father and mother.  Constitutional Exam  General appearance: Well nourished, well developed, and well hydrated. In no apparent acute distress Vitals:   05/10/18 1256  BP: (!) 107/53  Pulse: 73  Resp: 16  Temp: 98.2 F (36.8 C)  TempSrc: Oral  SpO2: 97%  Weight: 157 lb (71.2 kg)  Height:  5' 2.5" (1.588 m)   BMI Assessment: Estimated body mass index is 28.26 kg/m as calculated from the following:   Height as of this encounter: 5' 2.5" (1.588 m).   Weight as of this encounter: 157 lb (71.2 kg).  BMI interpretation table: BMI level Category Range association with higher incidence of chronic pain  <18 kg/m2 Underweight   18.5-24.9 kg/m2 Ideal body weight   25-29.9 kg/m2 Overweight Increased incidence by 20%  30-34.9 kg/m2 Obese (Class I) Increased incidence by 68%  35-39.9 kg/m2 Severe obesity (Class II) Increased incidence by 136%  >40 kg/m2 Extreme obesity (Class III) Increased incidence by 254%   Patient's current BMI Ideal Body weight  Body mass index is 28.26 kg/m. Ideal body weight: 51.3 kg (112 lb 15.8 oz) Adjusted ideal body weight: 59.2 kg (130 lb 9.5 oz)   BMI Readings from Last 4 Encounters:  05/10/18 28.26 kg/m  03/14/18 28.17 kg/m  03/12/18 28.18 kg/m  03/07/18 27.81 kg/m   Wt Readings from Last 4 Encounters:  05/10/18 157 lb (71.2 kg)  03/14/18 159 lb (72.1 kg)  03/12/18 159 lb 1 oz (72.2 kg)  03/07/18 157 lb (71.2 kg)  Psych/Mental status: Alert, oriented x 3 (person, place, & time)       Eyes: PERLA Respiratory: No evidence of acute respiratory distress  Cervical Spine Area Exam  Skin & Axial Inspection: No masses, redness, edema, swelling, or associated skin lesions Alignment: Symmetrical Functional ROM: Unrestricted ROM      Stability: No instability detected Muscle Tone/Strength: Functionally intact. No obvious neuro-muscular anomalies detected. Sensory (Neurological): Unimpaired Palpation: No palpable anomalies              Upper Extremity (UE) Exam    Side: Right upper extremity  Side: Left upper extremity  Skin & Extremity Inspection: Skin color, temperature, and hair growth are WNL. No peripheral edema or cyanosis. No masses, redness, swelling, asymmetry, or associated skin lesions. No contractures.  Skin & Extremity Inspection:  Skin color, temperature, and hair growth are WNL. No peripheral edema or cyanosis. No masses, redness, swelling, asymmetry, or associated skin lesions. No contractures.  Functional ROM: Unrestricted ROM          Functional ROM: Unrestricted ROM          Muscle Tone/Strength: Functionally intact. No obvious neuro-muscular anomalies detected.  Muscle Tone/Strength: Functionally intact. No obvious neuro-muscular anomalies detected.  Sensory (Neurological): Unimpaired          Sensory (Neurological): Unimpaired  Palpation: No palpable anomalies              Palpation: No palpable anomalies              Provocative Test(s):  Phalen's test: deferred Tinel's test: deferred Apley's scratch test (touch opposite shoulder):  Action 1 (Across chest): deferred Action 2 (Overhead): deferred Action 3 (LB reach): deferred   Provocative Test(s):  Phalen's test: deferred Tinel's test: deferred Apley's scratch test (touch opposite shoulder):  Action 1 (Across chest): deferred Action 2 (Overhead): deferred Action 3 (LB reach): deferred    Thoracic Spine Area Exam  Skin & Axial Inspection: No masses, redness, or swelling Alignment: Symmetrical Functional ROM: Unrestricted ROM Stability: No instability detected Muscle Tone/Strength: Functionally intact. No obvious neuro-muscular anomalies detected. Sensory (Neurological): Unimpaired Muscle strength & Tone: No palpable anomalies  Lumbar Spine Area Exam  Skin & Axial Inspection: No masses, redness, or swelling Alignment: Symmetrical Functional ROM: Unrestricted ROM       Stability: No instability detected Muscle Tone/Strength: Functionally intact. No obvious neuro-muscular anomalies detected. Sensory (Neurological): Unimpaired Palpation: No palpable anomalies       Provocative Tests: Hyperextension/rotation test: deferred today       Lumbar quadrant test (Kemp's test): deferred today       Lateral bending test: deferred today        Patrick's Maneuver: deferred today                   FABER test: deferred today                   S-I anterior distraction/compression test: deferred today         S-I lateral compression test: deferred today         S-I Thigh-thrust test: deferred today         S-I Gaenslen's test: deferred today          Gait & Posture Assessment  Ambulation: Unassisted Gait: Relatively normal for age and body habitus Posture: WNL   Lower Extremity Exam    Side: Right lower extremity  Side: Left lower extremity  Stability: No instability observed          Stability: No instability observed          Skin & Extremity Inspection: Edema  Skin & Extremity Inspection: Edema high boot worn  Functional ROM: Unrestricted ROM                  Functional ROM: Unrestricted ROM                  Muscle Tone/Strength: Functionally intact. No obvious neuro-muscular anomalies detected.  Muscle Tone/Strength: Functionally intact. No obvious neuro-muscular anomalies detected.  Sensory (Neurological): Unimpaired  Sensory (Neurological): Unimpaired  Palpation: No palpable anomalies  Palpation: No palpable anomalies   Assessment  Primary Diagnosis & Pertinent Problem List: The primary encounter diagnosis was Lumbar spondylosis. Diagnoses of Cervical spondylosis, Fibromyalgia, Vitamin B12 deficiency, Vitamin D insufficiency, and Chronic pain syndrome were also pertinent to this visit.  Status Diagnosis  Controlled Controlled Controlled 1. Lumbar spondylosis   2. Cervical spondylosis   3. Fibromyalgia   4. Vitamin B12 deficiency   5. Vitamin D insufficiency   6. Chronic pain syndrome     Problems updated and reviewed during this visit: No problems updated. Plan of Care  Pharmacotherapy (Medications Ordered): Meds ordered this encounter  Medications  . cyclobenzaprine (FLEXERIL) 5 MG tablet    Sig: Take  1 tablet (5 mg total) by mouth 4 (four) times daily.    Dispense:  120 tablet    Refill:  2    Do not  place this medication, or any other prescription from our practice, on "Automatic Refill". Patient may have prescription filled one day early if pharmacy is closed on scheduled refill date.    Order Specific Question:   Supervising Provider    Answer:   Milinda Pointer [phone]  . cyanocobalamin ([facility] VITAMIN B12) 2000 MCG tablet    Sig: Take 1 tablet (2,000 mcg total) by mouth daily.    Dispense:  30 tablet    Refill:  2    Do not place this medication, or any other prescription from our practice, on "Automatic Refill".    Order Specific Question:   Supervising Provider    Answer:   Milinda Pointer [phone]  . Cholecalciferol (VITAMIN D3) 2000 units capsule    Sig: Take 1 capsule (2,000 Units total) by mouth daily.    Dispense:  30 capsule    Refill:  2    Do not place this medication, or any other prescription from our practice, on "Automatic Refill".    Order Specific Question:   Supervising Provider    Answer:   Milinda Pointer [phone]  . Melatonin 10 MG CAPS    Sig: Take 20 mg by mouth at bedtime as needed.    Dispense:  60 capsule    Refill:  2    Do not place this medication, or any other prescription from our practice, on "Automatic Refill". Patient may have prescription filled one day early if pharmacy is closed on scheduled refill date.    Order Specific Question:   Supervising Provider    Answer:   Milinda Pointer [phone]  . pregabalin (LYRICA) 100 MG capsule    Sig: Take 1 capsule (100 mg total) by mouth at bedtime.    Dispense:  30 capsule    Refill:  2    Do not place this medication on "Automatic Refill". Patient may have prescription filled one day early if pharmacy is closed on scheduled refill date.    Order Specific Question:   Supervising Provider    Answer:   Milinda Pointer [phone]  . pregabalin (LYRICA) 25 MG capsule    Sig: Take 1 capsule (25 mg total) by mouth 2 (two) times daily.    Dispense:  45 capsule    Refill:  2    Do not place this  medication on "Automatic Refill". Patient may have prescription filled one day early if pharmacy is closed on scheduled refill date.    Order Specific Question:   Supervising Provider    Answer:   Milinda Pointer [phone]  . HYDROcodone-acetaminophen (NORCO/VICODIN) 5-325 MG tablet    Sig: Take 1 tablet by mouth every 4 (four) hours as needed for severe pain.    Dispense:  180 tablet    Refill:  0    Do not place this medication on "Automatic Refill". Patient may have prescription filled one day early if pharmacy is closed on scheduled refill date. Do not fill until:07/29/2018 To last until:08/28/2018    Order Specific Question:   Supervising Provider    Answer:   Milinda Pointer [phone]  . HYDROcodone-acetaminophen (NORCO/VICODIN) 5-325 MG tablet    Sig: Take 1 tablet by mouth every 4 (four) hours as needed for severe pain.    Dispense:  180 tablet    Refill:  0  Do not place this medication on "Automatic Refill". Patient may have prescription filled one day early if pharmacy is closed on scheduled refill date. Do not fill until:06/29/2018 To last until:07/29/2018    Order Specific Question:   Supervising Provider    Answer:   Milinda Pointer [phone]  . HYDROcodone-acetaminophen (NORCO/VICODIN) 5-325 MG tablet    Sig: Take 1 tablet by mouth every 4 (four) hours as needed for severe pain.    Dispense:  180 tablet    Refill:  0    Do not place this medication on "Automatic Refill". Patient may have prescription filled one day early if pharmacy is closed on scheduled refill date. Do not fill until: 05/30/2018 To last until:06/29/2018    Order Specific Question:   Supervising Provider    Answer:   Milinda Pointer [phone]   New Prescriptions   No medications on file   Medications administered today: Zoe Sanchez had no medications administered during this visit. Lab-work, procedure(s), and/or referral(s): No orders of the defined types were placed in this  encounter.  Imaging and/or referral(s): None  nterventional therapies: Planned, scheduled, and/or pending: Not at this time.    Considering:  Diagnostic bilateral cervical facetblock  Possible bilateral cervical facet radiofrequencyablation  Diagnostic right-sided cervical epiduralsteroid injection  Diagnostic bilateral intra-articular shoulder jointinjection  Diagnostic bilateral suprascapular nerveblock  Possible bilateral suprascapular nerve radiofrequencyablation  Diagnostic bilateral lumbar facetblock  Possible bilateral lumbar facet radiofrequencyablation  Diagnostic left sided carpal tunnel injection   Palliative PRN treatment(s):  Palliativebilateral cervical facetblock  Palliativeright-sided cervical epiduralsteroid injection  Palliativebilateral intra-articular shoulderjoint injection Palliativebilateral suprascapular nerveblock  Palliativebilateral lumbar facet block  Palliativeleft sided L4-5 lumbar epidural steroid injection  Palliativeleft sided carpal tunnelinjection     Provider-requested follow-up: Return in about 3 months (around 08/10/2018) for MedMgmt with Me Donella Stade Edison Pace).  Future Appointments  Date Time Provider [place]  08/01/2018  1:30 PM Vevelyn Francois, NP [facility] None   Primary Care Physician: Denton Lank, MD Location: [facility] Note by: Vevelyn Francois NP Date: 05/10/2018; Time: 1:58 PM  Pain Score Disclaimer: We use the NRS-11 scale. This is a self-reported, subjective measurement of pain severity with only modest accuracy. It is used primarily to identify changes within a particular patient. It must be understood that outpatient pain scales are significantly less accurate that those used for research, where they can be applied under ideal controlled circumstances with minimal exposure to variables. In reality, the score is likely to be a combination of pain intensity and pain  affect, where pain affect describes the degree of emotional arousal or changes in action readiness caused by the sensory experience of pain. Factors such as social and work situation, setting, emotional state, anxiety levels, expectation, and prior pain experience may influence pain perception and show large inter-individual differences that may also be affected by time variables.  Patient instructions provided during this appointment: Patient Instructions  Rx for D3, B12, flexeril, melatonin, Lyrica and 3 Rx for hydrocodone with acetaminophen to last until 08/28/2018 have been escribed to your pharmacy.____________________________________________________________________________________________  Medication Rules  Applies to: All patients receiving prescriptions (written or electronic).  Pharmacy of record: Pharmacy where electronic prescriptions will be sent. If written prescriptions are taken to a different pharmacy, please inform the nursing staff. The pharmacy listed in the electronic medical record should be the one where you would like electronic prescriptions to be sent.  Prescription refills: Only during scheduled appointments. Applies to both, written and  electronic prescriptions.  NOTE: The following applies primarily to controlled substances (Opioid* Pain Medications).   Patient's responsibilities: 1. Pain Pills: Bring all pain pills to every appointment (except for procedure appointments). 2. Pill Bottles: Bring pills in original pharmacy bottle. Always bring newest bottle. Bring bottle, even if empty. 3. Medication refills: You are responsible for knowing and keeping track of what medications you need refilled. The day before your appointment, write a list of all prescriptions that need to be refilled. Bring that list to your appointment and give it to the admitting nurse. Prescriptions will be written only during appointments. If you forget a medication, it will not be "Called in",  "Faxed", or "electronically sent". You will need to get another appointment to get these prescribed. 4. Prescription Accuracy: You are responsible for carefully inspecting your prescriptions before leaving our office. Have the discharge nurse carefully go over each prescription with you, before taking them home. Make sure that your name is accurately spelled, that your address is correct. Check the name and dose of your medication to make sure it is accurate. Check the number of pills, and the written instructions to make sure they are clear and accurate. Make sure that you are given enough medication to last until your next medication refill appointment. 5. Taking Medication: Take medication as prescribed. Never take more pills than instructed. Never take medication more frequently than prescribed. Taking less pills or less frequently is permitted and encouraged, when it comes to controlled substances (written prescriptions).  6. Inform other Doctors: Always inform, all of your healthcare providers, of all the medications you take. 7. Pain Medication from other Providers: You are not allowed to accept any additional pain medication from any other Doctor or Healthcare provider. There are two exceptions to this rule. (see below) In the event that you require additional pain medication, you are responsible for notifying us, as stated below. 8. Medication Agreement: You are responsible for carefully reading and following our Medication Agreement. This must be signed before receiving any prescriptions from our practice. Safely store a copy of your signed Agreement. Violations to the Agreement will result in no further prescriptions. (Additional copies of our Medication Agreement are available upon request.) 9. Laws, Rules, & Regulations: All patients are expected to follow all Federal and [employer], [employer], Rules, [employer]. Ignorance of the Laws does not constitute a valid excuse. The use of any illegal  substances is prohibited. 10. Adopted CDC guidelines & recommendations: Target dosing levels will be at or below 60 MME/day. Use of benzodiazepines** is not recommended.  Exceptions: There are only two exceptions to the rule of not receiving pain medications from other Healthcare Providers. 1. Exception #1 (Emergencies): In the event of an emergency (i.e.: accident requiring emergency care), you are allowed to receive additional pain medication. However, you are responsible for: As soon as you are able, call our office [phone], at any time of the day or night, and leave a message stating your name, the date and nature of the emergency, and the name and dose of the medication prescribed. In the event that your call is answered by a member of our staff, make sure to document and save the date, time, and the name of the person that took your information.  2. Exception #2 (Planned Surgery): In the event that you are scheduled by another doctor or dentist to have any type of surgery or procedure, you are allowed (for a period no longer than 30 days), to  receive additional pain medication, for the acute post-op pain. However, in this case, you are responsible for picking up a copy of our "Post-op Pain Management for Surgeons" handout, and giving it to your surgeon or dentist. This document is available at our office, and does not require an appointment to obtain it. Simply go to our office during business hours (Monday-Thursday from 8:00 AM to 4:00 PM) (Friday 8:00 AM to 12:00 Noon) or if you have a scheduled appointment with [place], prior to your surgery, and ask for it by name. In addition, you will need to provide [place] with your name, name of your surgeon, type of surgery, and date of procedure or surgery.  *Opioid medications include: morphine, codeine, oxycodone, oxymorphone, hydrocodone, hydromorphone, meperidine, tramadol, tapentadol, buprenorphine, fentanyl, methadone. **Benzodiazepine medications  include: diazepam (Valium), alprazolam (Xanax), clonazepam (Klonopine), lorazepam (Ativan), clorazepate (Tranxene), chlordiazepoxide (Librium), estazolam (Prosom), oxazepam (Serax), temazepam (Restoril), triazolam (Halcion) (Last updated: 11/09/2017) ____________________________________________________________________________________________

## 2018-05-10 NOTE — Patient Instructions (Addendum)
Rx for D3, B12, flexeril, melatonin, Lyrica and 3 Rx for hydrocodone with acetaminophen to last until 08/28/2018 have been escribed to your pharmacy.____________________________________________________________________________________________  Medication Rules  Applies to: All patients receiving prescriptions (written or electronic).  Pharmacy of record: Pharmacy where electronic prescriptions will be sent. If written prescriptions are taken to a different pharmacy, please inform the nursing staff. The pharmacy listed in the electronic medical record should be the one where you would like electronic prescriptions to be sent.  Prescription refills: Only during scheduled appointments. Applies to both, written and electronic prescriptions.  NOTE: The following applies primarily to controlled substances (Opioid* Pain Medications).   Patient's responsibilities: 1. Pain Pills: Bring all pain pills to every appointment (except for procedure appointments). 2. Pill Bottles: Bring pills in original pharmacy bottle. Always bring newest bottle. Bring bottle, even if empty. 3. Medication refills: You are responsible for knowing and keeping track of what medications you need refilled. The day before your appointment, write a list of all prescriptions that need to be refilled. Bring that list to your appointment and give it to the admitting nurse. Prescriptions will be written only during appointments. If you forget a medication, it will not be "Called in", "Faxed", or "electronically sent". You will need to get another appointment to get these prescribed. 4. Prescription Accuracy: You are responsible for carefully inspecting your prescriptions before leaving our office. Have the discharge nurse carefully go over each prescription with you, before taking them home. Make sure that your name is accurately spelled, that your address is correct. Check the name and dose of your medication to make sure it is accurate.  Check the number of pills, and the written instructions to make sure they are clear and accurate. Make sure that you are given enough medication to last until your next medication refill appointment. 5. Taking Medication: Take medication as prescribed. Never take more pills than instructed. Never take medication more frequently than prescribed. Taking less pills or less frequently is permitted and encouraged, when it comes to controlled substances (written prescriptions).  6. Inform other Doctors: Always inform, all of your healthcare providers, of all the medications you take. 7. Pain Medication from other Providers: You are not allowed to accept any additional pain medication from any other Doctor or Healthcare provider. There are two exceptions to this rule. (see below) In the event that you require additional pain medication, you are responsible for notifying us, as stated below. 8. Medication Agreement: You are responsible for carefully reading and following our Medication Agreement. This must be signed before receiving any prescriptions from our practice. Safely store a copy of your signed Agreement. Violations to the Agreement will result in no further prescriptions. (Additional copies of our Medication Agreement are available upon request.) 9. Laws, Rules, & Regulations: All patients are expected to follow all Federal and [employer], [employer], Rules, [employer]. Ignorance of the Laws does not constitute a valid excuse. The use of any illegal substances is prohibited. 10. Adopted CDC guidelines & recommendations: Target dosing levels will be at or below 60 MME/day. Use of benzodiazepines** is not recommended.  Exceptions: There are only two exceptions to the rule of not receiving pain medications from other Healthcare Providers. 1. Exception #1 (Emergencies): In the event of an emergency (i.e.: accident requiring emergency care), you are allowed to receive additional pain medication. However, you  are responsible for: As soon as you are able, call our office [phone], at any time of the day or  night, and leave a message stating your name, the date and nature of the emergency, and the name and dose of the medication prescribed. In the event that your call is answered by a member of our staff, make sure to document and save the date, time, and the name of the person that took your information.  2. Exception #2 (Planned Surgery): In the event that you are scheduled by another doctor or dentist to have any type of surgery or procedure, you are allowed (for a period no longer than 30 days), to receive additional pain medication, for the acute post-op pain. However, in this case, you are responsible for picking up a copy of our "Post-op Pain Management for Surgeons" handout, and giving it to your surgeon or dentist. This document is available at our office, and does not require an appointment to obtain it. Simply go to our office during business hours (Monday-Thursday from 8:00 AM to 4:00 PM) (Friday 8:00 AM to 12:00 Noon) or if you have a scheduled appointment with [place], prior to your surgery, and ask for it by name. In addition, you will need to provide [place] with your name, name of your surgeon, type of surgery, and date of procedure or surgery.  *Opioid medications include: morphine, codeine, oxycodone, oxymorphone, hydrocodone, hydromorphone, meperidine, tramadol, tapentadol, buprenorphine, fentanyl, methadone. **Benzodiazepine medications include: diazepam (Valium), alprazolam (Xanax), clonazepam (Klonopine), lorazepam (Ativan), clorazepate (Tranxene), chlordiazepoxide (Librium), estazolam (Prosom), oxazepam (Serax), temazepam (Restoril), triazolam (Halcion) (Last updated: 11/09/2017) ____________________________________________________________________________________________

## 2018-05-18 ENCOUNTER — Other Ambulatory Visit: Payer: Self-pay | Admitting: Pain Medicine

## 2018-06-01 ENCOUNTER — Other Ambulatory Visit: Payer: Self-pay | Admitting: Orthopedic Surgery

## 2018-06-01 ENCOUNTER — Other Ambulatory Visit: Payer: Self-pay

## 2018-06-01 DIAGNOSIS — S82842A Displaced bimalleolar fracture of left lower leg, initial encounter for closed fracture: Secondary | ICD-10-CM

## 2018-06-12 ENCOUNTER — Ambulatory Visit
Admission: RE | Admit: 2018-06-12 | Discharge: 2018-06-12 | Disposition: A | Discharge status: Home / Self Care | Payer: Medicare Other | Source: Ambulatory Visit | Attending: Orthopedic Surgery | Admitting: Orthopedic Surgery

## 2018-06-12 DIAGNOSIS — M8580 Other specified disorders of bone density and structure, unspecified site: Secondary | ICD-10-CM | POA: Insufficient documentation

## 2018-06-12 DIAGNOSIS — S82842D Displaced bimalleolar fracture of left lower leg, subsequent encounter for closed fracture with routine healing: Secondary | ICD-10-CM | POA: Diagnosis not present

## 2018-06-12 DIAGNOSIS — X58XXXD Exposure to other specified factors, subsequent encounter: Secondary | ICD-10-CM | POA: Diagnosis not present

## 2018-06-12 DIAGNOSIS — S82842A Displaced bimalleolar fracture of left lower leg, initial encounter for closed fracture: Secondary | ICD-10-CM

## 2018-06-20 ENCOUNTER — Encounter: Payer: Self-pay | Admitting: Emergency Medicine

## 2018-06-20 ENCOUNTER — Emergency Department: Payer: Medicare Other

## 2018-06-20 ENCOUNTER — Emergency Department
Admission: EM | Admit: 2018-06-20 | Discharge: 2018-06-20 | Disposition: A | Discharge status: Home / Self Care | Payer: Medicare Other | Attending: Emergency Medicine | Admitting: Emergency Medicine

## 2018-06-20 DIAGNOSIS — I2510 Atherosclerotic heart disease of native coronary artery without angina pectoris: Secondary | ICD-10-CM | POA: Insufficient documentation

## 2018-06-20 DIAGNOSIS — Z85828 Personal history of other malignant neoplasm of skin: Secondary | ICD-10-CM | POA: Diagnosis not present

## 2018-06-20 DIAGNOSIS — R2242 Localized swelling, mass and lump, left lower limb: Secondary | ICD-10-CM | POA: Diagnosis not present

## 2018-06-20 DIAGNOSIS — J449 Chronic obstructive pulmonary disease, unspecified: Secondary | ICD-10-CM | POA: Insufficient documentation

## 2018-06-20 DIAGNOSIS — M79605 Pain in left leg: Secondary | ICD-10-CM | POA: Diagnosis not present

## 2018-06-20 NOTE — ED Provider Notes (Signed)
[facility] Emergency Department Provider Note       Time seen: ----------------------------------------- 12:08 PM on 06/20/2018 -----------------------------------------   I have reviewed the triage vital signs and the nursing notes.  HISTORY   Chief Complaint Knot on back of leg    HPI Zoe Sanchez is a 62 y.o. female with a history of anxiety, coronary artery disease, COPD, depression, fibromyalgia who presents to the ED for pain and swelling behind the left leg.  Patient describes that she had surgery on the ankle in July and she is had increased pain in her left leg ever since then.  She arrives in a orthopedic boot.  She is concerned about a knot behind the left leg and reports its painful.  She denies any chest pain or difficulty breathing.  She arrives for evaluation concerning possible blood clot.  Past Medical History:  Diagnosis Date  . Abnormal heart rhythm   . Abnormal mammogram 02/25/2014  . Anemia    vitamin b12 and vitamin d deficiency  . Anxiety   . Asthma   . Awareness of heartbeats 06/29/2015  . Breath shortness 06/29/2015  . CAD (coronary artery disease)   . Cancer ([place])    skin cancers  . Cervical radicular pain (Location of Secondary source of pain) (Bilateral) (R>L) (C8 Dermatome) 06/29/2015  . Chronic pain   . COPD (chronic obstructive pulmonary disease) ([place])   . Degenerative disk disease   . Depression   . Dysrhythmia    svt  . Endometriosis   . Family history of chronic pain 06/29/2015  . Fibromyalgia   . Fibromyalgia   . GERD (gastroesophageal reflux disease)   . Heart murmur   . Hiatal hernia   . History of attempted suicide 06/29/2015  . History of hiatal hernia 06/29/2015  . History of psychiatric disorder 06/29/2015  . History of suicidal ideation 06/29/2015  . Hyperlipidemia   . Insomnia   . Insomnia   . Irritable bowel syndrome     Patient Active Problem List   Diagnosis Date Noted  . L4-5 Lumbar  Facet joint Synovial cyst 05/24/2017  . Chronic lower extremity pain (Bilateral) (R>L) 05/24/2017  . Chronic lumbar radicular pain (Bilateral) (S1) 05/24/2017  . Chronic upper extremity pain (Secondary source of pain) (Bilateral) (R>L) 04/11/2017  . Lumbar facet syndrome (Bilateral) (R>L) 03/22/2017  . Lumbar facet arthropathy 03/02/2017  . Bimalleolar fracture of left ankle 07/21/2016  . Cervical facet arthropathy 03/19/2016  . Chronic shoulder pain (Bilateral) 12/24/2015  . Opioid-induced constipation (OIC) 12/23/2015  . Carpal tunnel syndrome (Left) 12/23/2015  . Claustrophobia 12/23/2015  . Long term prescription opiate use 09/28/2015  . Vitamin D insufficiency 09/28/2015  . Encounter for therapeutic drug level monitoring 06/29/2015  . Long term current use of opiate analgesic 06/29/2015  . Opiate use (30 MME/Day) 06/29/2015  . Magnesium deficiency 06/29/2015  . Vitamin B12 deficiency 06/29/2015  . Myofascial pain syndrome 06/29/2015  . Cervical facet syndrome (Bilateral) (R>L) 06/29/2015  . Chronic neck pain (Primary Source of Pain) (Bilateral) (R>L) 06/29/2015  . Chronic pain syndrome 06/29/2015  . Cervical spondylosis 06/29/2015  . Anxiety 06/29/2015  . Paroxysmal supraventricular tachycardia ([place]) 06/29/2015  . Syncope and collapse 06/29/2015  . Fibromyalgia 06/29/2015  . Cervical herniated disc (C5-6 and C6-7) 06/29/2015  . Cervical foraminal stenosis (Bilateral) (C5-6) 06/29/2015  . Chronic cervical radicular pain (Bilateral) (R>L) (C8 Dermatome) 06/29/2015  . Chronic low back pain [facility] source of pain) (Bilateral) (R>L) 06/29/2015  . Lumbar  spondylosis 06/29/2015  . Coronary atherosclerosis of native coronary artery 06/29/2015  . Generalized anxiety disorder 06/29/2015  . Intermittent urinary incontinence 06/29/2015  . History of psychiatric disorder 06/29/2015  . Depression 06/29/2015  . Family history of chronic pain 06/29/2015  . History of attempted suicide  06/29/2015  . History of suicidal ideation 06/29/2015  . Irritable bowel syndrome 06/29/2015  . Intermittent diarrhea 06/29/2015  . Chronic constipation 06/29/2015  . Insomnia secondary to chronic pain 06/29/2015  . History of hiatal hernia 06/29/2015  . Microcalcifications of the breast 02/25/2014    Past Surgical History:  Procedure Laterality Date  . ABDOMINAL HYSTERECTOMY  1986  . BREAST BIOPSY Right 2016   CORE W/CLIP - EARLY INTRADUCTAL PAPILLOMA  . BREAST SURGERY    . OOPHORECTOMY  1986   same time as hysterectomy  . ORIF ANKLE FRACTURE Left 03/14/2018   Procedure: OPEN REDUCTION INTERNAL FIXATION (ORIF) ANKLE FRACTURE;  Surgeon: Lovell Sheehan, MD;  Location: [facility] ORS;  Service: Orthopedics;  Laterality: Left;  . SKIN LESION EXCISION     basal cell and squamous cell  . TONSILLECTOMY  1957    Allergies Prednisone  Social History Social History   Tobacco Use  . Smoking status: Never Smoker  . Smokeless tobacco: Never Used  Substance Use Topics  . Alcohol use: No    Alcohol/week: 0.0 standard drinks  . Drug use: No   Review of Systems Constitutional: Negative for fever. Cardiovascular: Negative for chest pain. Respiratory: Negative for shortness of breath. Gastrointestinal: Negative for abdominal pain, vomiting and diarrhea. Musculoskeletal: Positive for left leg pain and swelling Skin: Negative for rash. Neurological: Negative for headaches, focal weakness or numbness.  All systems negative/normal/unremarkable except as stated in the HPI  ____________________________________________   PHYSICAL EXAM:  VITAL SIGNS: ED Triage Vitals  Enc Vitals Group     BP 06/20/18 1156 (!) 110/43     Pulse Rate 06/20/18 1156 76     Resp 06/20/18 1156 16     Temp 06/20/18 1156 (!) 97.5 F (36.4 C)     Temp Source 06/20/18 1156 Oral     SpO2 06/20/18 1156 97 %     Weight 06/20/18 1157 155 lb (70.3 kg)     Height 06/20/18 1157 5\' 3"  (1.6 m)     Head Circumference --       Peak Flow --      Pain Score 06/20/18 1157 3     Pain Loc --      Pain Edu? --      Excl. in [place]? --    Constitutional: Alert and oriented. Well appearing and in no distress. Eyes: Conjunctivae are normal. Normal extraocular movements. Cardiovascular: Normal rate, regular rhythm. No murmurs, rubs, or gallops. Respiratory: Normal respiratory effort without tachypnea nor retractions. Breath sounds are clear and equal bilaterally. No wheezes/rales/rhonchi. Gastrointestinal: Soft and nontender. Normal bowel sounds Musculoskeletal: Nontender with normal range of motion in extremities.  Patient points to an area around the distal hamstring muscle on the left as the area of tenderness.  Nothing abnormal is palpated there. Neurologic:  Normal speech and language. No gross focal neurologic deficits are appreciated.  Skin:  Skin is warm, dry and intact. No rash noted. Psychiatric: Mood and affect are normal. Speech and behavior are normal.  ____________________________________________  ED COURSE:  As part of my medical decision making, I reviewed the following data within the [place] History obtained from family if available, nursing notes, old  chart and ekg, as well as notes from prior ED visits. Patient presented for left leg pain and swelling, we will assess with imaging as indicated at this time.   Procedures ____________________________________________   RADIOLOGY Images were viewed by me  Lower extremity ultrasound Is unremarkable ____________________________________________  DIFFERENTIAL DIAGNOSIS   Superficial phlebitis, DVT, spasm  FINAL ASSESSMENT AND PLAN  Leg pain   Plan: The patient had presented for left leg pain. Patient's imaging is unremarkable, no signs of DVT.  She will be discharged to home, she is encouraged to take her Vicodin as needed.   Laurence Aly, MD   Note: This note was generated in part or whole with voice  recognition software. Voice recognition is usually quite accurate but there are transcription errors that can and very often do occur. I apologize for any typographical errors that were not detected and corrected.     Earleen Newport, MD 06/20/18 [phone]

## 2018-06-20 NOTE — ED Notes (Signed)
Regarding knot on back of leg: small knot noted about 1 in above back of knee on left side. Appears to be bruising (brownish/blueish/greenish) discoloration surrounding knot. PT denies hitting  Her leg but says that the boot rubs her right there.

## 2018-06-20 NOTE — ED Triage Notes (Signed)
Pt had surgery to ankle in July and states she has has increased pain to her left leg since. Pt arrives in orthopedic boot. Pt in concerned over knot to the back of pt's left leg. Knot palpated by this RN and pt reports area is painful. Pt denies chest pain or shortness of breath.

## 2018-06-20 NOTE — ED Notes (Signed)
Patient transported to Ultrasound 

## 2018-06-20 NOTE — ED Triage Notes (Signed)
First Nurse Note:  C/O left lower leg swelling "for several weeks".  In ED for evaluation for possible blood clot.  States recently fractured left ankle and had surgical repair in July.  Patient is AAOx3.  Skin warm and dry. NAD

## 2018-07-04 ENCOUNTER — Other Ambulatory Visit (HOSPITAL_COMMUNITY): Payer: Self-pay | Admitting: Orthopedic Surgery

## 2018-07-06 ENCOUNTER — Other Ambulatory Visit: Payer: Self-pay | Admitting: Nurse Practitioner

## 2018-07-10 ENCOUNTER — Telehealth: Payer: Self-pay | Admitting: Pain Medicine

## 2018-07-10 NOTE — Telephone Encounter (Signed)
Patient is having ankle surgery  On Nov 14th and needs a letter to her surgeon Dr. Georgena Spurling, at [facility] in [place] stating it is ok for him to give meds. Please call her when she can pick up. Says Dr. Delane Ginger has done this before.

## 2018-07-10 NOTE — Telephone Encounter (Signed)
Patient notified that she could come by and get information for surgery at any time.

## 2018-07-23 ENCOUNTER — Encounter (HOSPITAL_BASED_OUTPATIENT_CLINIC_OR_DEPARTMENT_OTHER): Payer: Self-pay | Admitting: *Deleted

## 2018-07-23 ENCOUNTER — Encounter: Payer: Self-pay | Admitting: Nurse Practitioner

## 2018-07-23 ENCOUNTER — Other Ambulatory Visit: Payer: Self-pay

## 2018-07-23 ENCOUNTER — Ambulatory Visit: Payer: Medicare Other | Attending: Nurse Practitioner | Admitting: Nurse Practitioner

## 2018-07-23 VITALS — BP 133/64 | HR 77 | Temp 97.5°F | Ht 63.0 in | Wt 155.0 lb

## 2018-07-23 DIAGNOSIS — Z79891 Long term (current) use of opiate analgesic: Secondary | ICD-10-CM

## 2018-07-23 DIAGNOSIS — E559 Vitamin D deficiency, unspecified: Secondary | ICD-10-CM | POA: Diagnosis not present

## 2018-07-23 DIAGNOSIS — J449 Chronic obstructive pulmonary disease, unspecified: Secondary | ICD-10-CM | POA: Insufficient documentation

## 2018-07-23 DIAGNOSIS — Z5181 Encounter for therapeutic drug level monitoring: Secondary | ICD-10-CM | POA: Diagnosis not present

## 2018-07-23 DIAGNOSIS — F329 Major depressive disorder, single episode, unspecified: Secondary | ICD-10-CM | POA: Insufficient documentation

## 2018-07-23 DIAGNOSIS — M797 Fibromyalgia: Secondary | ICD-10-CM

## 2018-07-23 DIAGNOSIS — M47816 Spondylosis without myelopathy or radiculopathy, lumbar region: Secondary | ICD-10-CM

## 2018-07-23 DIAGNOSIS — G4700 Insomnia, unspecified: Secondary | ICD-10-CM | POA: Diagnosis not present

## 2018-07-23 DIAGNOSIS — M549 Dorsalgia, unspecified: Secondary | ICD-10-CM | POA: Diagnosis present

## 2018-07-23 DIAGNOSIS — M47812 Spondylosis without myelopathy or radiculopathy, cervical region: Secondary | ICD-10-CM

## 2018-07-23 DIAGNOSIS — G894 Chronic pain syndrome: Secondary | ICD-10-CM | POA: Diagnosis not present

## 2018-07-23 DIAGNOSIS — Z792 Long term (current) use of antibiotics: Secondary | ICD-10-CM | POA: Insufficient documentation

## 2018-07-23 DIAGNOSIS — E538 Deficiency of other specified B group vitamins: Secondary | ICD-10-CM

## 2018-07-23 DIAGNOSIS — I2510 Atherosclerotic heart disease of native coronary artery without angina pectoris: Secondary | ICD-10-CM | POA: Diagnosis not present

## 2018-07-23 DIAGNOSIS — E785 Hyperlipidemia, unspecified: Secondary | ICD-10-CM | POA: Diagnosis not present

## 2018-07-23 DIAGNOSIS — F419 Anxiety disorder, unspecified: Secondary | ICD-10-CM | POA: Insufficient documentation

## 2018-07-23 DIAGNOSIS — K219 Gastro-esophageal reflux disease without esophagitis: Secondary | ICD-10-CM | POA: Diagnosis not present

## 2018-07-23 DIAGNOSIS — Z8249 Family history of ischemic heart disease and other diseases of the circulatory system: Secondary | ICD-10-CM | POA: Insufficient documentation

## 2018-07-23 MED ORDER — HYDROCODONE-ACETAMINOPHEN 5-325 MG PO TABS: 1.0000 | ORAL_TABLET | ORAL | 0 refills | Status: DC | PRN

## 2018-07-23 MED ORDER — MELATONIN 10 MG PO CAPS: 20.0000 mg | ORAL_CAPSULE | Freq: Every evening | ORAL | 2 refills | Status: DC | PRN

## 2018-07-23 MED ORDER — CYCLOBENZAPRINE HCL 5 MG PO TABS: 5.0000 mg | ORAL_TABLET | Freq: Four times a day (QID) | ORAL | 2 refills | Status: DC

## 2018-07-23 MED ORDER — PREGABALIN 100 MG PO CAPS: 100.0000 mg | ORAL_CAPSULE | Freq: Every day | ORAL | 2 refills | Status: DC

## 2018-07-23 MED ORDER — VITAMIN D3 50 MCG (2000 UT) PO CAPS: 2000.0000 [IU] | ORAL_CAPSULE | Freq: Every day | ORAL | 2 refills | Status: DC

## 2018-07-23 MED ORDER — PREGABALIN 25 MG PO CAPS: 25.0000 mg | ORAL_CAPSULE | Freq: Two times a day (BID) | ORAL | 2 refills | Status: DC

## 2018-07-23 MED ORDER — CYANOCOBALAMIN 2000 MCG PO TABS: 2000.0000 ug | ORAL_TABLET | Freq: Every day | ORAL | 2 refills | Status: DC

## 2018-07-23 NOTE — Patient Instructions (Addendum)
_Electronic prescriptions ready at pharmacy for Cholecalciferol (VITAMIN D3) 50 MCG (2000 UT) capsule,    cyanocobalamin ([employer] VITAMIN B12) 2000 MCG tablet, cyclobenzaprine (FLEXERIL) 5 MG tablet, CV, Melatonin 10 MG CAPS, and pregabalin (LYRICA) 100 MG capsule. HYDROcodone-acetaminophen (NORCO/VICODIN) 5-325 MG tablet to be filled 08/28/18 and to last until 10/27/2018.        ___________________________________________________________________________________________  Medication Rules  Applies to: All patients receiving prescriptions (written or electronic).  Pharmacy of record: Pharmacy where electronic prescriptions will be sent. If written prescriptions are taken to a different pharmacy, please inform the nursing staff. The pharmacy listed in the electronic medical record should be the one where you would like electronic prescriptions to be sent.  Prescription refills: Only during scheduled appointments. Applies to both, written and electronic prescriptions.  NOTE: The following applies primarily to controlled substances (Opioid* Pain Medications).   Patient's responsibilities: 1. Pain Pills: Bring all pain pills to every appointment (except for procedure appointments). 2. Pill Bottles: Bring pills in original pharmacy bottle. Always bring newest bottle. Bring bottle, even if empty. 3. Medication refills: You are responsible for knowing and keeping track of what medications you need refilled. The day before your appointment, write a list of all prescriptions that need to be refilled. Bring that list to your appointment and give it to the admitting nurse. Prescriptions will be written only during appointments. If you forget a medication, it will not be "Called in", "Faxed", or "electronically sent". You will need to get another appointment to get these prescribed. 4. Prescription Accuracy: You are responsible for carefully inspecting your prescriptions before leaving our office. Have the  discharge nurse carefully go over each prescription with you, before taking them home. Make sure that your name is accurately spelled, that your address is correct. Check the name and dose of your medication to make sure it is accurate. Check the number of pills, and the written instructions to make sure they are clear and accurate. Make sure that you are given enough medication to last until your next medication refill appointment. 5. Taking Medication: Take medication as prescribed. Never take more pills than instructed. Never take medication more frequently than prescribed. Taking less pills or less frequently is permitted and encouraged, when it comes to controlled substances (written prescriptions).  6. Inform other Doctors: Always inform, all of your healthcare providers, of all the medications you take. 7. Pain Medication from other Providers: You are not allowed to accept any additional pain medication from any other Doctor or Healthcare provider. There are two exceptions to this rule. (see below) In the event that you require additional pain medication, you are responsible for notifying us, as stated below. 8. Medication Agreement: You are responsible for carefully reading and following our Medication Agreement. This must be signed before receiving any prescriptions from our practice. Safely store a copy of your signed Agreement. Violations to the Agreement will result in no further prescriptions. (Additional copies of our Medication Agreement are available upon request.) 9. Laws, Rules, & Regulations: All patients are expected to follow all Federal and [employer], Rules, [employer]. Ignorance of the Laws does not constitute a valid excuse. The use of any illegal substances is prohibited. 10. Adopted CDC guidelines & recommendations: Target dosing levels will be at or below 60 MME/day. Use of benzodiazepines** is not recommended.  Exceptions: There are only two exceptions to the rule of  not receiving pain medications from other Healthcare Providers. 1. Exception #1 (Emergencies): In the event of  an emergency (i.e.: accident requiring emergency care), you are allowed to receive additional pain medication. However, you are responsible for: As soon as you are able, call our office [phone], at any time of the day or night, and leave a message stating your name, the date and nature of the emergency, and the name and dose of the medication prescribed. In the event that your call is answered by a member of our staff, make sure to document and save the date, time, and the name of the person that took your information.  2. Exception #2 (Planned Surgery): In the event that you are scheduled by another doctor or dentist to have any type of surgery or procedure, you are allowed (for a period no longer than 30 days), to receive additional pain medication, for the acute post-op pain. However, in this case, you are responsible for picking up a copy of our "Post-op Pain Management for Surgeons" handout, and giving it to your surgeon or dentist. This document is available at our office, and does not require an appointment to obtain it. Simply go to our office during business hours (Monday-Thursday from 8:00 AM to 4:00 PM) (Friday 8:00 AM to 12:00 Noon) or if you have a scheduled appointment with [place], prior to your surgery, and ask for it by name. In addition, you will need to provide [place] with your name, name of your surgeon, type of surgery, and date of procedure or surgery.  *Opioid medications include: morphine, codeine, oxycodone, oxymorphone, hydrocodone, hydromorphone, meperidine, tramadol, tapentadol, buprenorphine, fentanyl, methadone. **Benzodiazepine medications include: diazepam (Valium), alprazolam (Xanax), clonazepam (Klonopine), lorazepam (Ativan), clorazepate (Tranxene), chlordiazepoxide (Librium), estazolam (Prosom), oxazepam (Serax), temazepam (Restoril), triazolam (Halcion) (Last  updated: 11/09/2017) ____________________________________________________________________________________________

## 2018-07-23 NOTE — Progress Notes (Signed)
Nursing Pain Medication Assessment:  Safety precautions to be maintained throughout the outpatient stay will include: orient to surroundings, keep bed in low position, maintain call bell within reach at all times, provide assistance with transfer out of bed and ambulation.  Medication Inspection Compliance: Pill count conducted under aseptic conditions, in front of the patient. Neither the pills nor the bottle was removed from the patient's sight at any time. Once count was completed pills were immediately returned to the patient in their original bottle.  Medication: Hydrocodone/APAP Pill/Patch Count: 41 of 180 pills remain Pill/Patch Appearance: Markings consistent with prescribed medication Bottle Appearance: Standard pharmacy container. Clearly labeled. Filled Date: 11 / 18 / 2018 Last Medication intake:  Today

## 2018-07-23 NOTE — Progress Notes (Signed)
Patient's Name: Zoe Sanchez  MRN: [id number]  Referring Provider: Denton Lank, MD  DOB: Feb 10, 1956  PCP: Zoe Lank, MD  DOS: 07/23/2018  Note by: Zoe Francois NP  Service setting: Ambulatory outpatient  Specialty: Interventional Pain Management  Location: [facility] (AMB) Pain Management Facility    Patient type: Established    Primary Reason(s) for Visit: Encounter for prescription drug management. (Level of risk: moderate)  CC: Back Pain  HPI  Zoe Sanchez is a 62 y.o. year old, female patient, who comes today for a medication management evaluation. She has Microcalcifications of the breast; Encounter for therapeutic drug level monitoring; Long term current use of opiate analgesic; Opiate use (30 MME/Day); Magnesium deficiency; Vitamin B12 deficiency; Myofascial pain syndrome; Cervical facet syndrome (Bilateral) (R>L); Chronic neck pain (Primary Source of Pain) (Bilateral) (R>L); Chronic pain syndrome; Cervical spondylosis; Anxiety; Paroxysmal supraventricular tachycardia ([place]); Syncope and collapse; Fibromyalgia; Cervical herniated disc (C5-6 and C6-7); Cervical foraminal stenosis (Bilateral) (C5-6); Chronic cervical radicular pain (Bilateral) (R>L) (C8 Dermatome); Chronic low back pain [facility] source of pain) (Bilateral) (R>L); Lumbar spondylosis; Coronary atherosclerosis of native coronary artery; Generalized anxiety disorder; Intermittent urinary incontinence; History of psychiatric disorder; Depression; Family history of chronic pain; History of attempted suicide; History of suicidal ideation; Irritable bowel syndrome; Intermittent diarrhea; Chronic constipation; Insomnia secondary to chronic pain; History of hiatal hernia; Long term prescription opiate use; Vitamin D insufficiency; Opioid-induced constipation (OIC); Carpal tunnel syndrome (Left); Claustrophobia; Chronic shoulder pain (Bilateral); Cervical facet arthropathy; Lumbar facet arthropathy; Bimalleolar fracture of left ankle; Lumbar  facet syndrome (Bilateral) (R>L); Chronic upper extremity pain (Secondary source of pain) (Bilateral) (R>L); L4-5 Lumbar Facet joint Synovial cyst; Chronic lower extremity pain (Bilateral) (R>L); and Chronic lumbar radicular pain (Bilateral) (S1) on their problem list. Her primarily concern today is the Back Pain  Pain Assessment: Location: Lower Back Radiating: pain radiaties down leg and ankle Onset: More than a month ago Duration: Chronic pain Quality: Shooting, Stabbing, Aching Severity: 3 /10 (subjective, self-reported pain score)  Note: Reported level is compatible with observation.                          Effect on ADL: my daily activites is limits, hard to take a shower Timing: Constant Modifying factors: medications, sitting and laying down , ice BP: 133/64  HR: 77  Zoe Sanchez was last scheduled for an appointment on 07/06/2018 for medication management. During today's appointment we reviewed Zoe Sanchez's chronic pain status, as well as her outpatient medication regimen.  Zoe Sanchez admits that she is going to have to have a revision of her left ankle on Thursday. The patient  reports that she does not use drugs. Her body mass index is 27.46 kg/m.  Further details on both, my assessment(s), as well as the proposed treatment plan, please see below.  Controlled Substance Pharmacotherapy Assessment REMS (Risk Evaluation and Mitigation Strategy)  Analgesic:Hydrocodone/APAP 5/325 one every4 hours (6 per day) (30 mg/day) MME/day:30 mg/day. Zoe Fischer, RN  07/23/2018 10:50 AM  Sign at close encounter Nursing Pain Medication Assessment:  Safety precautions to be maintained throughout the outpatient stay will include: orient to surroundings, keep bed in low position, maintain call bell within reach at all times, provide assistance with transfer out of bed and ambulation.  Medication Inspection Compliance: Pill count conducted under aseptic conditions, in front of the patient. Neither  the pills nor the bottle was removed from the patient's sight at any time. Once count  was completed pills were immediately returned to the patient in their original bottle.  Medication: Hydrocodone/APAP Pill/Patch Count: 41 of 180 pills remain Pill/Patch Appearance: Markings consistent with prescribed medication Bottle Appearance: Standard pharmacy container. Clearly labeled. Filled Date: 54 / 18 / 2018 Last Medication intake:  Today   Pharmacokinetics: Liberation and absorption (onset of action): WNL Distribution (time to peak effect): WNL Metabolism and excretion (duration of action): WNL         Pharmacodynamics: Desired effects: Analgesia: [name] reports >50% benefit. Functional ability: Patient reports that medication allows her to accomplish basic ADLs Clinically meaningful improvement in function (CMIF): Sustained CMIF goals met Perceived effectiveness: Described as relatively effective, allowing for increase in activities of daily living (ADL) Undesirable effects: Side-effects or Adverse reactions: None reported Monitoring: [place] PMP: Online review of the past 73-monthperiod conducted. Compliant with practice rules and regulations Last UDS on record: Summary  Date Value Ref Range Status  11/20/2017 FINAL  Final    Comment:    ==================================================================== TOXASSURE SELECT 13 (MW) ==================================================================== Test                             Result       Flag       Units Drug Present and Declared for Prescription Verification   Hydrocodone                    3143         EXPECTED   ng/mg creat   Hydromorphone                  328          EXPECTED   ng/mg creat   Dihydrocodeine                 834          EXPECTED   ng/mg creat   Norhydrocodone                 1868         EXPECTED   ng/mg creat    Sources of hydrocodone include scheduled prescription    medications. Hydromorphone,  dihydrocodeine and norhydrocodone are    expected metabolites of hydrocodone. Hydromorphone and    dihydrocodeine are also available as scheduled prescription    medications. ==================================================================== Test                      Result    Flag   Units      Ref Range   Creatinine              47               mg/dL      >=20 ==================================================================== Declared Medications:  The flagging and interpretation on this report are based on the  following declared medications.  Unexpected results may arise from  inaccuracies in the declared medications.  **Note: The testing scope of this panel includes these medications:  Hydrocodone (Norco)  **Note: The testing scope of this panel does not include following  reported medications:  Acetaminophen (Norco)  Amitriptyline  Cholecalciferol  Cyanocobalamin  Desonide  Dicyclomine  Estradiol  Fluticasone  Gemfibrozil (Lopid)  Melatonin  Montelukast (Singulair)  Omeprazole (Prilosec)  Pregabalin (Lyrica)  Propranolol (Inderal)  Salmeterol (Serevent)  Umeclidinium (Incruse Ellipta)  Zolpidem (Ambien) ==================================================================== For clinical consultation, please call ([phone] ====================================================================  UDS interpretation: Compliant          Medication Assessment Form: Reviewed. Patient indicates being compliant with therapy Treatment compliance: Compliant Risk Assessment Profile: Aberrant behavior: See prior evaluations. None observed or detected today Comorbid factors increasing risk of overdose: See prior notes. No additional risks detected today Opioid risk tool (ORT) (Total Score): 0 Personal History of Substance Abuse (SUD-Substance use disorder):  Alcohol: Negative  Illegal Drugs: Negative  Rx Drugs: Negative  ORT Risk Level calculation: Low Risk Risk of  substance use disorder (SUD): Low Opioid Risk Tool - 07/23/18 1101      Family History of Substance Abuse   Alcohol  Negative    Illegal Drugs  Negative    Rx Drugs  Negative      Personal History of Substance Abuse   Alcohol  Negative    Illegal Drugs  Negative    Rx Drugs  Negative      Age   Age between 26-45 years   No      History of Preadolescent Sexual Abuse   History of Preadolescent Sexual Abuse  Negative or Female      Psychological Disease   Psychological Disease  Negative    Depression  Negative      Total Score   Opioid Risk Tool Scoring  0    Opioid Risk Interpretation  Low Risk      ORT Scoring interpretation table:  Score <3 = Low Risk for SUD  Score between 4-7 = Moderate Risk for SUD  Score >8 = High Risk for Opioid Abuse   Risk Mitigation Strategies:  Patient Counseling: Covered Patient-Prescriber Agreement (PPA): Present and active  Notification to other healthcare providers: Done  Pharmacologic Plan: No change in therapy, at this time.             Laboratory Chemistry  Inflammation Markers (CRP: Acute Phase) (ESR: Chronic Phase) Lab Results  Component Value Date   ESRSEDRATE 26 09/28/2015                         Rheumatology Markers No results found for: RF, ANA, LABURIC, URICUR, LYMEIGGIGMAB, LYMEABIGMQN, HLAB27                      Renal Function Markers Lab Results  Component Value Date   BUN 19 03/07/2018   CREATININE 0.91 03/07/2018   GFRAA >60 03/07/2018   GFRNONAA >60 03/07/2018                             Hepatic Function Markers Lab Results  Component Value Date   AST 32 03/07/2018   ALT 22 03/07/2018   ALBUMIN 3.9 03/07/2018   ALKPHOS 62 03/07/2018                        Electrolytes Lab Results  Component Value Date   NA 137 03/07/2018   K 4.1 03/07/2018   CL 100 03/07/2018   CALCIUM 9.0 03/07/2018   MG 1.8 03/09/2016                        Neuropathy Markers Lab Results  Component Value Date   [id number]  010 (H) 03/09/2016                        CNS Tests No  results found for: COLORCSF, APPEARCSF, RBCCOUNTCSF, WBCCSF, POLYSCSF, LYMPHSCSF, EOSCSF, PROTEINCSF, GLUCCSF, JCVIRUS, CSFOLI, IGGCSF                      Bone Pathology Markers Lab Results  Component Value Date   25OHVITD1 49 03/09/2016   25OHVITD2 <1.0 03/09/2016   25OHVITD3 48 03/09/2016                         Coagulation Parameters Lab Results  Component Value Date   INR 0.9 03/06/2012   LABPROT 12.8 03/06/2012   PLT 333 03/07/2018                        Cardiovascular Markers Lab Results  Component Value Date   CKTOTAL 57 11/09/2012   CKMB < 0.5 (L) 11/09/2012   TROPONINI < 0.02 11/09/2012   HGB 12.1 03/07/2018   HCT 34.7 (L) 03/07/2018                         CA Markers No results found for: CEA, CA125, LABCA2                      Note: Lab results reviewed.  Recent Diagnostic Imaging Results  US Venous Img Lower Unilateral Left CLINICAL DATA:  62 year old female with left lower extremity pain and swelling for the past 3 weeks.  EXAM: LEFT LOWER EXTREMITY VENOUS DOPPLER ULTRASOUND  TECHNIQUE: Gray-scale sonography with graded compression, as well as color Doppler and duplex ultrasound were performed to evaluate the lower extremity deep venous systems from the level of the common femoral vein and including the common femoral, femoral, profunda femoral, popliteal and calf veins including the posterior tibial, peroneal and gastrocnemius veins when visible. The superficial great saphenous vein was also interrogated. Spectral Doppler was utilized to evaluate flow at rest and with distal augmentation maneuvers in the common femoral, femoral and popliteal veins.  COMPARISON:  None.  FINDINGS: Contralateral Common Femoral Vein: Respiratory phasicity is normal and symmetric with the symptomatic side. No evidence of thrombus. Normal compressibility.  Common Femoral Vein: No evidence of thrombus.  Normal compressibility, respiratory phasicity and response to augmentation.  Saphenofemoral Junction: No evidence of thrombus. Normal compressibility and flow on color Doppler imaging.  Profunda Femoral Vein: No evidence of thrombus. Normal compressibility and flow on color Doppler imaging.  Femoral Vein: No evidence of thrombus. Normal compressibility, respiratory phasicity and response to augmentation.  Popliteal Vein: No evidence of thrombus. Normal compressibility, respiratory phasicity and response to augmentation.  Calf Veins: No evidence of thrombus. Normal compressibility and flow on color Doppler imaging.  Superficial Great Saphenous Vein: No evidence of thrombus. Normal compressibility.  Venous Reflux:  None.  Other Findings:  None.  IMPRESSION: No evidence of deep venous thrombosis.  Electronically Signed   By: Jacqulynn Cadet M.D.   On: 06/20/2018 14:08  Complexity Note: Imaging results reviewed. Results shared with Ms. Grupe, using Layman's terms.                         Meds   Current Outpatient Medications:  .  amitriptyline (ELAVIL) 100 MG tablet, Take 200 mg by mouth at bedtime. , Disp: , Rfl:  .  Cholecalciferol (VITAMIN D3) 50 MCG (2000 UT) capsule, Take 1 capsule (2,000 Units total) by mouth daily., Disp: 30 capsule, Rfl: 2 .  cyanocobalamin ([employer] VITAMIN B12) 2000 MCG tablet, Take 1 tablet (2,000 mcg total) by mouth daily., Disp: 30 tablet, Rfl: 2 .  cyclobenzaprine (FLEXERIL) 5 MG tablet, Take 1 tablet (5 mg total) by mouth 4 (four) times daily., Disp: 120 tablet, Rfl: 2 .  dicyclomine (BENTYL) 20 MG tablet, Take 20 mg by mouth daily as needed for spasms. , Disp: , Rfl:  .  estradiol (ESTRACE) 1 MG tablet, Take 1 mg by mouth daily. , Disp: , Rfl:  .  fluticasone (FLONASE) 50 MCG/ACT nasal spray, Place 2 sprays into both nostrils daily. , Disp: , Rfl:  .  gemfibrozil (LOPID) 600 MG tablet, Take 600 mg by mouth 2 (two) times daily before a meal. ,  Disp: , Rfl:  .  [START ON 08/28/2018] HYDROcodone-acetaminophen (NORCO/VICODIN) 5-325 MG tablet, Take 1 tablet by mouth every 4 (four) hours as needed for severe pain., Disp: 180 tablet, Rfl: 0 .  INCRUSE ELLIPTA 62.5 MCG/INH AEPB, Inhale 1 puff into the lungs daily. , Disp: , Rfl:  .  magnesium gluconate (MAGONATE) 500 MG tablet, Take 500 mg by mouth daily., Disp: , Rfl:  .  Melatonin 10 MG CAPS, Take 20 mg by mouth at bedtime as needed., Disp: 60 capsule, Rfl: 2 .  metroNIDAZOLE (FLAGYL) 500 MG tablet, Take 500 mg by mouth 2 (two) times daily., Disp: , Rfl:  .  montelukast (SINGULAIR) 10 MG tablet, Take by mouth at bedtime. , Disp: , Rfl:  .  omeprazole (PRILOSEC) 20 MG capsule, Take 20 mg by mouth 2 (two) times daily before a meal. , Disp: , Rfl:  .  pregabalin (LYRICA) 100 MG capsule, Take 1 capsule (100 mg total) by mouth at bedtime., Disp: 30 capsule, Rfl: 2 .  propranolol (INDERAL) 40 MG tablet, TAKE 40 - 280MG BY MOUTH DAILY AS NEEDED, Disp: , Rfl:  .  propranolol ER (INDERAL LA) 80 MG 24 hr capsule, TAKE 2 CAPSULES BY MOUTH ONCE DAILY at bedtime, Disp: , Rfl:  .  salmeterol (SEREVENT DISKUS) 50 MCG/DOSE diskus inhaler, Inhale 1 puff into the lungs daily. , Disp: , Rfl:  .  zolpidem (AMBIEN) 10 MG tablet, Take 10 mg by mouth at bedtime. , Disp: , Rfl:  .  [START ON 09/27/2018] HYDROcodone-acetaminophen (NORCO/VICODIN) 5-325 MG tablet, Take 1 tablet by mouth every 4 (four) hours as needed for severe pain., Disp: 180 tablet, Rfl: 0 .  pregabalin (LYRICA) 25 MG capsule, Take 1 capsule (25 mg total) by mouth 2 (two) times daily., Disp: 60 capsule, Rfl: 2  ROS  Constitutional: Denies any fever or chills Gastrointestinal: No reported hemesis, hematochezia, vomiting, or acute GI distress Musculoskeletal: Denies any acute onset joint swelling, redness, loss of ROM, or weakness Neurological: No reported episodes of acute onset apraxia, aphasia, dysarthria, agnosia, amnesia, paralysis, loss of  coordination, or loss of consciousness  Allergies  Ms. Peet is allergic to prednisone.  PFSH  Drug: Ms. Torregrossa  reports that she does not use drugs. Alcohol:  reports that she does not drink alcohol. Tobacco:  reports that she has never smoked. She has never used smokeless tobacco. Medical:  has a past medical history of Abnormal heart rhythm, Abnormal mammogram (02/25/2014), Anemia, Anxiety, Asthma, Awareness of heartbeats (06/29/2015), Breath shortness (06/29/2015), CAD (coronary artery disease), Cancer (HCC), Cervical radicular pain (Location of Secondary source of pain) (Bilateral) (R>L) (C8 Dermatome) (06/29/2015), Chronic pain, COPD (chronic obstructive pulmonary disease) ([place]), Degenerative disk disease, Depression, Dysrhythmia, Endometriosis, Family history of chronic pain (06/29/2015), Fibromyalgia,  Fibromyalgia, GERD (gastroesophageal reflux disease), Heart murmur, Hiatal hernia, History of attempted suicide (06/29/2015), History of hiatal hernia (06/29/2015), History of psychiatric disorder (06/29/2015), History of suicidal ideation (06/29/2015), Hyperlipidemia, Insomnia, Insomnia, and Irritable bowel syndrome. Surgical: Ms. Mochizuki  has a past surgical history that includes Abdominal hysterectomy (1986); Tonsillectomy (1957); Skin lesion excision; Oophorectomy (1986); Breast surgery; Breast biopsy (Right, 2016); and ORIF ankle fracture (Left, 03/14/2018). Family: family history includes Breast cancer in her maternal aunt; Heart disease in her father; Hypertension in her father and mother.  Constitutional Exam  General appearance: Well nourished, well developed, and well hydrated. In no apparent acute distress Vitals:   07/23/18 1050  BP: 133/64  Pulse: 77  Temp: (!) 97.5 F (36.4 C)  SpO2: 100%  Weight: 155 lb (70.3 kg)  Height: '5\' 3"'  (1.6 m)   BMI Assessment: Estimated body mass index is 27.46 kg/m as calculated from the following:   Height as of this encounter: '5\' 3"'  (1.6  m).   Weight as of this encounter: 155 lb (70.3 kg). Psych/Mental status: Alert, oriented x 3 (person, place, & time)       Eyes: PERLA Respiratory: No evidence of acute respiratory distress  Cervical Spine Area Exam  Skin & Axial Inspection: No masses, redness, edema, swelling, or associated skin lesions Alignment: Symmetrical Functional ROM: Unrestricted ROM      Stability: No instability detected Muscle Tone/Strength: Functionally intact. No obvious neuro-muscular anomalies detected. Sensory (Neurological): Unimpaired Palpation: No palpable anomalies              Upper Extremity (UE) Exam    Side: Right upper extremity  Side: Left upper extremity  Skin & Extremity Inspection: Skin color, temperature, and hair growth are WNL. No peripheral edema or cyanosis. No masses, redness, swelling, asymmetry, or associated skin lesions. No contractures.  Skin & Extremity Inspection: Skin color, temperature, and hair growth are WNL. No peripheral edema or cyanosis. No masses, redness, swelling, asymmetry, or associated skin lesions. No contractures.  Functional ROM: Unrestricted ROM          Functional ROM: Unrestricted ROM          Muscle Tone/Strength: Functionally intact. No obvious neuro-muscular anomalies detected.  Muscle Tone/Strength: Functionally intact. No obvious neuro-muscular anomalies detected.  Sensory (Neurological): Unimpaired          Sensory (Neurological): Unimpaired          Palpation: No palpable anomalies              Palpation: No palpable anomalies              Provocative Test(s):  Phalen's test: deferred Tinel's test: deferred Apley's scratch test (touch opposite shoulder):  Action 1 (Across chest): deferred Action 2 (Overhead): deferred Action 3 (LB reach): deferred   Provocative Test(s):  Phalen's test: deferred Tinel's test: deferred Apley's scratch test (touch opposite shoulder):  Action 1 (Across chest): deferred Action 2 (Overhead): deferred Action 3 (LB  reach): deferred    Thoracic Spine Area Exam  Skin & Axial Inspection: No masses, redness, or swelling Alignment: Symmetrical Functional ROM: Unrestricted ROM Stability: No instability detected Muscle Tone/Strength: Functionally intact. No obvious neuro-muscular anomalies detected. Sensory (Neurological): Unimpaired Muscle strength & Tone: No palpable anomalies  Lumbar Spine Area Exam  Skin & Axial Inspection: No masses, redness, or swelling Alignment: Symmetrical Functional ROM: Unrestricted ROM       Stability: No instability detected Muscle Tone/Strength: Functionally intact. No obvious neuro-muscular anomalies detected. Sensory (Neurological): Unimpaired Palpation:  No palpable anomalies       Provocative Tests: Hyperextension/rotation test: deferred today       Lumbar quadrant test (Kemp's test): deferred today       Lateral bending test: deferred today       Patrick's Maneuver: deferred today                   FABER test: deferred today                   S-I anterior distraction/compression test: deferred today         S-I lateral compression test: deferred today         S-I Thigh-thrust test: deferred today         S-I Gaenslen's test: deferred today          Gait & Posture Assessment  Ambulation: Unassisted Gait: Relatively normal for age and body habitus Posture: WNL   Lower Extremity Exam    Side: Right lower extremity  Side: Left lower extremity  Stability: No instability observed          Stability: No instability observed          Skin & Extremity Inspection: Skin color, temperature, and hair growth are WNL. No peripheral edema or cyanosis. No masses, redness, swelling, asymmetry, or associated skin lesions. No contractures.  Skin & Extremity Inspection: Evidence of prior arthroplastic surgery high knee hard  brace worn  Functional ROM: Unrestricted ROM                  Functional ROM: Limited ROM                  Muscle Tone/Strength: Functionally intact. No  obvious neuro-muscular anomalies detected.  Muscle Tone/Strength: Functionally intact. No obvious neuro-muscular anomalies detected.  Sensory (Neurological): Unimpaired  Sensory (Neurological): Unimpaired  Palpation: No palpable anomalies  Palpation: No palpable anomalies   Assessment  Primary Diagnosis & Pertinent Problem List: The primary encounter diagnosis was Cervical spondylosis. Diagnoses of Lumbar spondylosis, Fibromyalgia, Chronic pain syndrome, Vitamin D insufficiency, Vitamin B12 deficiency, and Long term prescription opiate use were also pertinent to this visit.  Status Diagnosis  Controlled Controlled Controlled 1. Cervical spondylosis   2. Lumbar spondylosis   3. Fibromyalgia   4. Chronic pain syndrome   5. Vitamin D insufficiency   6. Vitamin B12 deficiency   7. Long term prescription opiate use     Problems updated and reviewed during this visit: No problems updated. Plan of Care  Pharmacotherapy (Medications Ordered): Meds ordered this encounter  Medications  . Cholecalciferol (VITAMIN D3) 50 MCG (2000 UT) capsule    Sig: Take 1 capsule (2,000 Units total) by mouth daily.    Dispense:  30 capsule    Refill:  2    Do not place this medication, or any other prescription from our practice, on "Automatic Refill".    Order Specific Question:   Supervising Provider    Answer:   Milinda Pointer [phone]  . cyanocobalamin ([employer] VITAMIN B12) 2000 MCG tablet    Sig: Take 1 tablet (2,000 mcg total) by mouth daily.    Dispense:  30 tablet    Refill:  2    Do not place this medication, or any other prescription from our practice, on "Automatic Refill".    Order Specific Question:   Supervising Provider    Answer:   Milinda Pointer [phone]  . cyclobenzaprine (FLEXERIL) 5 MG tablet  Sig: Take 1 tablet (5 mg total) by mouth 4 (four) times daily.    Dispense:  120 tablet    Refill:  2    Do not place this medication, or any other prescription from our practice, on  "Automatic Refill". Patient may have prescription filled one day early if pharmacy is closed on scheduled refill date.    Order Specific Question:   Supervising Provider    Answer:   Milinda Pointer [phone]  . HYDROcodone-acetaminophen (NORCO/VICODIN) 5-325 MG tablet    Sig: Take 1 tablet by mouth every 4 (four) hours as needed for severe pain.    Dispense:  180 tablet    Refill:  0    Do not add this medication to the electronic "Automatic Refill" notification system. Patient may have prescription filled one day early if pharmacy is closed on scheduled refill date.    Order Specific Question:   Supervising Provider    Answer:   Milinda Pointer [phone]  . Melatonin 10 MG CAPS    Sig: Take 20 mg by mouth at bedtime as needed.    Dispense:  60 capsule    Refill:  2    Do not place this medication, or any other prescription from our practice, on "Automatic Refill". Patient may have prescription filled one day early if pharmacy is closed on scheduled refill date.    Order Specific Question:   Supervising Provider    Answer:   Milinda Pointer [phone]  . pregabalin (LYRICA) 100 MG capsule    Sig: Take 1 capsule (100 mg total) by mouth at bedtime.    Dispense:  30 capsule    Refill:  2    Do not place this medication on "Automatic Refill". Patient may have prescription filled one day early if pharmacy is closed on scheduled refill date.    Order Specific Question:   Supervising Provider    Answer:   Milinda Pointer [phone]  . pregabalin (LYRICA) 25 MG capsule    Sig: Take 1 capsule (25 mg total) by mouth 2 (two) times daily.    Dispense:  60 capsule    Refill:  2    Do not place this medication on "Automatic Refill". Patient may have prescription filled one day early if pharmacy is closed on scheduled refill date.    Order Specific Question:   Supervising Provider    Answer:   Milinda Pointer [phone]  . HYDROcodone-acetaminophen (NORCO/VICODIN) 5-325 MG tablet    Sig:  Take 1 tablet by mouth every 4 (four) hours as needed for severe pain.    Dispense:  180 tablet    Refill:  0    Do not add this medication to the electronic "Automatic Refill" notification system. Patient may have prescription filled one day early if pharmacy is closed on scheduled refill date.    Order Specific Question:   Supervising Provider    Answer:   Milinda Pointer [[id number]]   New Prescriptions   No medications on file   Medications administered today: Qamar L. Raisch had no medications administered during this visit. Lab-work, procedure(s), and/or referral(s): Orders Placed This Encounter  Procedures  . ToxASSURE Select 13 (MW), Urine   Imaging and/or referral(s): None nterventional therapies: Planned, scheduled, and/or pending: Not at this time.   Considering:  Diagnostic bilateral cervical facetblock  Possible bilateral cervical facet radiofrequencyablation  Diagnostic right-sided cervical epiduralsteroid injection  Diagnostic bilateral intra-articular shoulder jointinjection  Diagnostic bilateral suprascapular nerveblock  Possible bilateral suprascapular nerve radiofrequencyablation  Diagnostic bilateral lumbar facetblock  Possible bilateral lumbar facet radiofrequencyablation  Diagnostic left sided carpal tunnel injection   Palliative PRN treatment(s):  Palliativebilateral cervical facetblock  Palliativeright-sided cervical epiduralsteroid injection  Palliativebilateral intra-articular shoulderjoint injection Palliativebilateral suprascapular nerveblock  Palliativebilateral lumbar facet block  Palliativeleft sided L4-5 lumbar epidural steroid injection  Palliativeleft sided carpal tunnelinjection     Provider-requested follow-up: Return in about 3 months (around 10/23/2018) for MedMgmt.  Future Appointments  Date Time Provider [place]  10/23/2018  1:30 PM Zoe Francois, NP [facility] None   Primary Care Physician:  Zoe Lank, MD Location: [facility] Note by: Zoe Francois NP Date: 07/23/2018; Time: 2:54 PM  Pain Score Disclaimer: We use the NRS-11 scale. This is a self-reported, subjective measurement of pain severity with only modest accuracy. It is used primarily to identify changes within a particular patient. It must be understood that outpatient pain scales are significantly less accurate that those used for research, where they can be applied under ideal controlled circumstances with minimal exposure to variables. In reality, the score is likely to be a combination of pain intensity and pain affect, where pain affect describes the degree of emotional arousal or changes in action readiness caused by the sensory experience of pain. Factors such as social and work situation, setting, emotional state, anxiety levels, expectation, and prior pain experience may influence pain perception and show large inter-individual differences that may also be affected by time variables.  Patient instructions provided during this appointment: Patient Instructions  _Electronic prescriptions ready at pharmacy for Cholecalciferol (VITAMIN D3) 50 MCG (2000 UT) capsule,    cyanocobalamin ([facility]S VITAMIN B12) 2000 MCG tablet, cyclobenzaprine (FLEXERIL) 5 MG tablet, CV, Melatonin 10 MG CAPS, and pregabalin (LYRICA) 100 MG capsule. HYDROcodone-acetaminophen (NORCO/VICODIN) 5-325 MG tablet to be filled 08/28/18 and to last until 10/27/2018.        ___________________________________________________________________________________________  Medication Rules  Applies to: All patients receiving prescriptions (written or electronic).  Pharmacy of record: Pharmacy where electronic prescriptions will be sent. If written prescriptions are taken to a different pharmacy, please inform the nursing staff. The pharmacy listed in the electronic medical record should be the one where you would like electronic  prescriptions to be sent.  Prescription refills: Only during scheduled appointments. Applies to both, written and electronic prescriptions.  NOTE: The following applies primarily to controlled substances (Opioid* Pain Medications).   Patient's responsibilities: 1. Pain Pills: Bring all pain pills to every appointment (except for procedure appointments). 2. Pill Bottles: Bring pills in original pharmacy bottle. Always bring newest bottle. Bring bottle, even if empty. 3. Medication refills: You are responsible for knowing and keeping track of what medications you need refilled. The day before your appointment, write a list of all prescriptions that need to be refilled. Bring that list to your appointment and give it to the admitting nurse. Prescriptions will be written only during appointments. If you forget a medication, it will not be "Called in", "Faxed", or "electronically sent". You will need to get another appointment to get these prescribed. 4. Prescription Accuracy: You are responsible for carefully inspecting your prescriptions before leaving our office. Have the discharge nurse carefully go over each prescription with you, before taking them home. Make sure that your name is accurately spelled, that your address is correct. Check the name and dose of your medication to make sure it is accurate. Check the number of pills, and the written instructions to make sure they are clear and accurate.  Make sure that you are given enough medication to last until your next medication refill appointment. 5. Taking Medication: Take medication as prescribed. Never take more pills than instructed. Never take medication more frequently than prescribed. Taking less pills or less frequently is permitted and encouraged, when it comes to controlled substances (written prescriptions).  6. Inform other Doctors: Always inform, all of your healthcare providers, of all the medications you take. 7. Pain Medication from  other Providers: You are not allowed to accept any additional pain medication from any other Doctor or Healthcare provider. There are two exceptions to this rule. (see below) In the event that you require additional pain medication, you are responsible for notifying us, as stated below. 8. Medication Agreement: You are responsible for carefully reading and following our Medication Agreement. This must be signed before receiving any prescriptions from our practice. Safely store a copy of your signed Agreement. Violations to the Agreement will result in no further prescriptions. (Additional copies of our Medication Agreement are available upon request.) 9. Laws, Rules, & Regulations: All patients are expected to follow all Federal and [employer], [employer], Rules, [employer]. Ignorance of the Laws does not constitute a valid excuse. The use of any illegal substances is prohibited. 10. Adopted CDC guidelines & recommendations: Target dosing levels will be at or below 60 MME/day. Use of benzodiazepines** is not recommended.  Exceptions: There are only two exceptions to the rule of not receiving pain medications from other Healthcare Providers. 1. Exception #1 (Emergencies): In the event of an emergency (i.e.: accident requiring emergency care), you are allowed to receive additional pain medication. However, you are responsible for: As soon as you are able, call our office [phone], at any time of the day or night, and leave a message stating your name, the date and nature of the emergency, and the name and dose of the medication prescribed. In the event that your call is answered by a member of our staff, make sure to document and save the date, time, and the name of the person that took your information.  2. Exception #2 (Planned Surgery): In the event that you are scheduled by another doctor or dentist to have any type of surgery or procedure, you are allowed (for a period no longer than 30 days), to  receive additional pain medication, for the acute post-op pain. However, in this case, you are responsible for picking up a copy of our "Post-op Pain Management for Surgeons" handout, and giving it to your surgeon or dentist. This document is available at our office, and does not require an appointment to obtain it. Simply go to our office during business hours (Monday-Thursday from 8:00 AM to 4:00 PM) (Friday 8:00 AM to 12:00 Noon) or if you have a scheduled appointment with [place], prior to your surgery, and ask for it by name. In addition, you will need to provide [place] with your name, name of your surgeon, type of surgery, and date of procedure or surgery.  *Opioid medications include: morphine, codeine, oxycodone, oxymorphone, hydrocodone, hydromorphone, meperidine, tramadol, tapentadol, buprenorphine, fentanyl, methadone. **Benzodiazepine medications include: diazepam (Valium), alprazolam (Xanax), clonazepam (Klonopine), lorazepam (Ativan), clorazepate (Tranxene), chlordiazepoxide (Librium), estazolam (Prosom), oxazepam (Serax), temazepam (Restoril), triazolam (Halcion) (Last updated: 11/09/2017) ____________________________________________________________________________________________

## 2018-07-26 ENCOUNTER — Other Ambulatory Visit: Payer: Self-pay

## 2018-07-26 ENCOUNTER — Ambulatory Visit (HOSPITAL_BASED_OUTPATIENT_CLINIC_OR_DEPARTMENT_OTHER): Payer: Medicare Other | Admitting: Certified Registered"

## 2018-07-26 ENCOUNTER — Encounter (HOSPITAL_BASED_OUTPATIENT_CLINIC_OR_DEPARTMENT_OTHER): Payer: Self-pay | Admitting: *Deleted

## 2018-07-26 ENCOUNTER — Ambulatory Visit (HOSPITAL_BASED_OUTPATIENT_CLINIC_OR_DEPARTMENT_OTHER)
Admission: RE | Admit: 2018-07-26 | Discharge: 2018-07-26 | Disposition: A | Discharge status: Home / Self Care | Payer: Medicare Other | Source: Ambulatory Visit | Attending: Orthopedic Surgery | Admitting: Orthopedic Surgery

## 2018-07-26 ENCOUNTER — Encounter (HOSPITAL_BASED_OUTPATIENT_CLINIC_OR_DEPARTMENT_OTHER)
Admission: RE | Disposition: A | Discharge status: Home / Self Care | Payer: Self-pay | Source: Ambulatory Visit | Attending: Orthopedic Surgery

## 2018-07-26 DIAGNOSIS — X58XXXD Exposure to other specified factors, subsequent encounter: Secondary | ICD-10-CM | POA: Insufficient documentation

## 2018-07-26 DIAGNOSIS — E785 Hyperlipidemia, unspecified: Secondary | ICD-10-CM | POA: Insufficient documentation

## 2018-07-26 DIAGNOSIS — G4700 Insomnia, unspecified: Secondary | ICD-10-CM | POA: Diagnosis not present

## 2018-07-26 DIAGNOSIS — Z79899 Other long term (current) drug therapy: Secondary | ICD-10-CM | POA: Diagnosis not present

## 2018-07-26 DIAGNOSIS — Z85828 Personal history of other malignant neoplasm of skin: Secondary | ICD-10-CM | POA: Diagnosis not present

## 2018-07-26 DIAGNOSIS — I10 Essential (primary) hypertension: Secondary | ICD-10-CM | POA: Diagnosis not present

## 2018-07-26 DIAGNOSIS — F329 Major depressive disorder, single episode, unspecified: Secondary | ICD-10-CM | POA: Diagnosis not present

## 2018-07-26 DIAGNOSIS — Z79891 Long term (current) use of opiate analgesic: Secondary | ICD-10-CM | POA: Diagnosis not present

## 2018-07-26 DIAGNOSIS — M797 Fibromyalgia: Secondary | ICD-10-CM | POA: Insufficient documentation

## 2018-07-26 DIAGNOSIS — K589 Irritable bowel syndrome without diarrhea: Secondary | ICD-10-CM | POA: Diagnosis not present

## 2018-07-26 DIAGNOSIS — S93432D Sprain of tibiofibular ligament of left ankle, subsequent encounter: Secondary | ICD-10-CM | POA: Diagnosis not present

## 2018-07-26 DIAGNOSIS — J449 Chronic obstructive pulmonary disease, unspecified: Secondary | ICD-10-CM | POA: Diagnosis not present

## 2018-07-26 DIAGNOSIS — S82855P Nondisplaced trimalleolar fracture of left lower leg, subsequent encounter for closed fracture with malunion: Secondary | ICD-10-CM | POA: Diagnosis not present

## 2018-07-26 DIAGNOSIS — F419 Anxiety disorder, unspecified: Secondary | ICD-10-CM | POA: Insufficient documentation

## 2018-07-26 DIAGNOSIS — S93432A Sprain of tibiofibular ligament of left ankle, initial encounter: Secondary | ICD-10-CM | POA: Diagnosis present

## 2018-07-26 DIAGNOSIS — I2510 Atherosclerotic heart disease of native coronary artery without angina pectoris: Secondary | ICD-10-CM | POA: Insufficient documentation

## 2018-07-26 DIAGNOSIS — K219 Gastro-esophageal reflux disease without esophagitis: Secondary | ICD-10-CM | POA: Insufficient documentation

## 2018-07-26 DIAGNOSIS — E559 Vitamin D deficiency, unspecified: Secondary | ICD-10-CM | POA: Insufficient documentation

## 2018-07-26 HISTORY — PX: ORIF ANKLE FRACTURE: SHX5408

## 2018-07-26 SURGERY — OPEN REDUCTION INTERNAL FIXATION (ORIF) ANKLE FRACTURE
Anesthesia: General | Site: Ankle | Laterality: Left

## 2018-07-26 MED ORDER — PROMETHAZINE HCL 25 MG/ML IJ SOLN: 6.2500 mg | INTRAMUSCULAR | Status: DC | PRN

## 2018-07-26 MED ORDER — FENTANYL CITRATE (PF) 100 MCG/2ML IJ SOLN
INTRAMUSCULAR | Status: AC
  Filled 2018-07-26: qty 2

## 2018-07-26 MED ORDER — FENTANYL CITRATE (PF) 100 MCG/2ML IJ SOLN: 25.0000 ug | INTRAMUSCULAR | Status: DC | PRN

## 2018-07-26 MED ORDER — SODIUM CHLORIDE 0.9 % IV SOLN: INTRAVENOUS | Status: DC

## 2018-07-26 MED ORDER — CLONIDINE HCL (ANALGESIA) 100 MCG/ML EP SOLN
EPIDURAL | Status: DC | PRN
  Administered 2018-07-26 (×2): 50 ug

## 2018-07-26 MED ORDER — CEFAZOLIN SODIUM-DEXTROSE 2-4 GM/100ML-% IV SOLN
2.0000 g | INTRAVENOUS | Status: AC
  Administered 2018-07-26: 2 g via INTRAVENOUS

## 2018-07-26 MED ORDER — PROPOFOL 500 MG/50ML IV EMUL
INTRAVENOUS | Status: DC | PRN
  Administered 2018-07-26: 25 ug/kg/min via INTRAVENOUS

## 2018-07-26 MED ORDER — MIDAZOLAM HCL 2 MG/2ML IJ SOLN
1.0000 mg | INTRAMUSCULAR | Status: DC | PRN
  Administered 2018-07-26: 2 mg via INTRAVENOUS

## 2018-07-26 MED ORDER — CHLORHEXIDINE GLUCONATE 4 % EX LIQD: 60.0000 mL | Freq: Once | CUTANEOUS | Status: DC

## 2018-07-26 MED ORDER — ONDANSETRON HCL 4 MG/2ML IJ SOLN
INTRAMUSCULAR | Status: DC | PRN
  Administered 2018-07-26: 4 mg via INTRAVENOUS

## 2018-07-26 MED ORDER — MIDAZOLAM HCL 2 MG/2ML IJ SOLN
INTRAMUSCULAR | Status: AC
  Filled 2018-07-26: qty 2

## 2018-07-26 MED ORDER — ROPIVACAINE HCL 5 MG/ML IJ SOLN
INTRAMUSCULAR | Status: DC | PRN
  Administered 2018-07-26: 20 mL via PERINEURAL

## 2018-07-26 MED ORDER — SCOPOLAMINE 1 MG/3DAYS TD PT72: 1.0000 | MEDICATED_PATCH | Freq: Once | TRANSDERMAL | Status: DC | PRN

## 2018-07-26 MED ORDER — FENTANYL CITRATE (PF) 100 MCG/2ML IJ SOLN
50.0000 ug | INTRAMUSCULAR | Status: DC | PRN
  Administered 2018-07-26: 100 ug via INTRAVENOUS

## 2018-07-26 MED ORDER — SENNA 8.6 MG PO TABS: 2.0000 | ORAL_TABLET | Freq: Two times a day (BID) | ORAL | 0 refills | Status: DC

## 2018-07-26 MED ORDER — LACTATED RINGERS IV SOLN
INTRAVENOUS | Status: DC
  Administered 2018-07-26 (×2): via INTRAVENOUS

## 2018-07-26 MED ORDER — PROPOFOL 10 MG/ML IV BOLUS
INTRAVENOUS | Status: DC | PRN
  Administered 2018-07-26: 200 mg via INTRAVENOUS

## 2018-07-26 MED ORDER — OXYCODONE HCL 5 MG PO TABS: 5.0000 mg | ORAL_TABLET | ORAL | 0 refills | Status: AC | PRN

## 2018-07-26 MED ORDER — BUPIVACAINE-EPINEPHRINE (PF) 0.5% -1:200000 IJ SOLN
INTRAMUSCULAR | Status: DC | PRN
  Administered 2018-07-26: 25 mL via PERINEURAL

## 2018-07-26 MED ORDER — EPHEDRINE SULFATE-NACL 50-0.9 MG/10ML-% IV SOSY
PREFILLED_SYRINGE | INTRAVENOUS | Status: DC | PRN
  Administered 2018-07-26 (×2): 5 mg via INTRAVENOUS

## 2018-07-26 MED ORDER — CEFAZOLIN SODIUM-DEXTROSE 2-4 GM/100ML-% IV SOLN
INTRAVENOUS | Status: AC
  Filled 2018-07-26: qty 100

## 2018-07-26 MED ORDER — ASPIRIN EC 81 MG PO TBEC: 81.0000 mg | DELAYED_RELEASE_TABLET | Freq: Two times a day (BID) | ORAL | 0 refills | Status: DC

## 2018-07-26 MED ORDER — 0.9 % SODIUM CHLORIDE (POUR BTL) OPTIME
TOPICAL | Status: DC | PRN
  Administered 2018-07-26: 500 mL

## 2018-07-26 MED ORDER — PHENYLEPHRINE 40 MCG/ML (10ML) SYRINGE FOR IV PUSH (FOR BLOOD PRESSURE SUPPORT)
PREFILLED_SYRINGE | INTRAVENOUS | Status: DC | PRN
  Administered 2018-07-26: 40 ug via INTRAVENOUS

## 2018-07-26 MED ORDER — DEXAMETHASONE SODIUM PHOSPHATE 10 MG/ML IJ SOLN
INTRAMUSCULAR | Status: DC | PRN
  Administered 2018-07-26: 4 mg via INTRAVENOUS

## 2018-07-26 MED ORDER — DOCUSATE SODIUM 100 MG PO CAPS: 100.0000 mg | ORAL_CAPSULE | Freq: Two times a day (BID) | ORAL | 0 refills | Status: DC

## 2018-07-26 SURGICAL SUPPLY — 87 items
ANCH SUT 1 SHRT SM RGD INSRTR (Anchor) ×1 IMPLANT
ANCHOR SUT 1.45 SZ 1 SHORT (Anchor) ×2 IMPLANT
BANDAGE ESMARK 6X9 LF (GAUZE/BANDAGES/DRESSINGS) ×1 IMPLANT
BIT DRILL 2.5X2.75 QC CALB (BIT) ×2 IMPLANT
BIT DRILL 3.5X5.5 QC CALB (BIT) ×2 IMPLANT
BLADE AVERAGE 25MMX9MM (BLADE) ×1
BLADE AVERAGE 25X9 (BLADE) ×1 IMPLANT
BLADE MICRO SAGITTAL (BLADE) IMPLANT
BLADE SURG 15 STRL LF DISP TIS (BLADE) ×2 IMPLANT
BLADE SURG 15 STRL SS (BLADE) ×15
BNDG CMPR 9X4 STRL LF SNTH (GAUZE/BANDAGES/DRESSINGS)
BNDG CMPR 9X6 STRL LF SNTH (GAUZE/BANDAGES/DRESSINGS) ×1
BNDG COHESIVE 4X5 TAN STRL (GAUZE/BANDAGES/DRESSINGS) ×3 IMPLANT
BNDG COHESIVE 6X5 TAN STRL LF (GAUZE/BANDAGES/DRESSINGS) ×3 IMPLANT
BNDG ESMARK 4X9 LF (GAUZE/BANDAGES/DRESSINGS) IMPLANT
BNDG ESMARK 6X9 LF (GAUZE/BANDAGES/DRESSINGS) ×3
CANISTER SUCT 1200ML W/VALVE (MISCELLANEOUS) ×3 IMPLANT
CHLORAPREP W/TINT 26ML (MISCELLANEOUS) ×3 IMPLANT
COVER BACK TABLE 60X90IN (DRAPES) ×3 IMPLANT
COVER MAYO STAND STRL (DRAPES) ×2 IMPLANT
COVER WAND RF STERILE (DRAPES) IMPLANT
CUFF TOURNIQUET SINGLE 34IN LL (TOURNIQUET CUFF) ×2 IMPLANT
DECANTER SPIKE VIAL GLASS SM (MISCELLANEOUS) IMPLANT
DRAPE EXTREMITY T 121X128X90 (DRAPE) ×3 IMPLANT
DRAPE OEC MINIVIEW 54X84 (DRAPES) ×3 IMPLANT
DRAPE U-SHAPE 47X51 STRL (DRAPES) ×3 IMPLANT
DRSG MEPITEL 4X7.2 (GAUZE/BANDAGES/DRESSINGS) ×3 IMPLANT
DRSG PAD ABDOMINAL 8X10 ST (GAUZE/BANDAGES/DRESSINGS) ×6 IMPLANT
ELECT REM PT RETURN 9FT ADLT (ELECTROSURGICAL) ×3
ELECTRODE REM PT RTRN 9FT ADLT (ELECTROSURGICAL) ×1 IMPLANT
GAUZE SPONGE 4X4 12PLY STRL (GAUZE/BANDAGES/DRESSINGS) ×3 IMPLANT
GLOVE BIO SURGEON STRL SZ 6.5 (GLOVE) ×1 IMPLANT
GLOVE BIO SURGEON STRL SZ8 (GLOVE) ×3 IMPLANT
GLOVE BIO SURGEONS STRL SZ 6.5 (GLOVE) ×1
GLOVE BIOGEL PI IND STRL 7.0 (GLOVE) IMPLANT
GLOVE BIOGEL PI IND STRL 8 (GLOVE) ×2 IMPLANT
GLOVE BIOGEL PI INDICATOR 7.0 (GLOVE) ×4
GLOVE BIOGEL PI INDICATOR 8 (GLOVE) ×4
GLOVE ECLIPSE 8.0 STRL XLNG CF (GLOVE) ×3 IMPLANT
GOWN STRL REUS W/ TWL LRG LVL3 (GOWN DISPOSABLE) ×1 IMPLANT
GOWN STRL REUS W/ TWL XL LVL3 (GOWN DISPOSABLE) ×2 IMPLANT
GOWN STRL REUS W/TWL LRG LVL3 (GOWN DISPOSABLE) ×3
GOWN STRL REUS W/TWL XL LVL3 (GOWN DISPOSABLE) ×6
NEEDLE HYPO 22GX1.5 SAFETY (NEEDLE) IMPLANT
NS IRRIG 1000ML POUR BTL (IV SOLUTION) ×3 IMPLANT
PACK BASIN DAY SURGERY FS (CUSTOM PROCEDURE TRAY) ×3 IMPLANT
PAD CAST 4YDX4 CTTN HI CHSV (CAST SUPPLIES) ×1 IMPLANT
PADDING CAST ABS 4INX4YD NS (CAST SUPPLIES)
PADDING CAST ABS COTTON 4X4 ST (CAST SUPPLIES) IMPLANT
PADDING CAST COTTON 4X4 STRL (CAST SUPPLIES) ×3
PADDING CAST COTTON 6X4 STRL (CAST SUPPLIES) ×3 IMPLANT
PENCIL BUTTON HOLSTER BLD 10FT (ELECTRODE) ×3 IMPLANT
PIN STEINMAN 9/64X9 TRO PT NS (PIN) ×2 IMPLANT
PLATE ACE 100DE 10H (Plate) ×2 IMPLANT
SANITIZER HAND PURELL 535ML FO (MISCELLANEOUS) ×3 IMPLANT
SCREW CORT FT 32X3.5XNONLOCK (Screw) IMPLANT
SCREW CORTICAL 3.5MM  20MM (Screw) ×4 IMPLANT
SCREW CORTICAL 3.5MM  32MM (Screw) ×2 IMPLANT
SCREW CORTICAL 3.5MM  55MM (Screw) ×2 IMPLANT
SCREW CORTICAL 3.5MM 14MM (Screw) ×6 IMPLANT
SCREW CORTICAL 3.5MM 20MM (Screw) IMPLANT
SCREW CORTICAL 3.5MM 24MM (Screw) ×2 IMPLANT
SCREW CORTICAL 3.5MM 32MM (Screw) ×1 IMPLANT
SCREW CORTICAL 3.5MM 50MM (Screw) ×2 IMPLANT
SCREW CORTICAL 3.5MM 55MM (Screw) IMPLANT
SHEET MEDIUM DRAPE 40X70 STRL (DRAPES) ×3 IMPLANT
SLEEVE SCD COMPRESS KNEE MED (MISCELLANEOUS) ×3 IMPLANT
SPLINT FAST PLASTER 5X30 (CAST SUPPLIES) ×40
SPLINT PLASTER CAST FAST 5X30 (CAST SUPPLIES) ×20 IMPLANT
SPONGE LAP 18X18 RF (DISPOSABLE) ×3 IMPLANT
STOCKINETTE 6  STRL (DRAPES) ×2
STOCKINETTE 6 STRL (DRAPES) ×1 IMPLANT
SUCTION FRAZIER HANDLE 10FR (MISCELLANEOUS) ×2
SUCTION TUBE FRAZIER 10FR DISP (MISCELLANEOUS) ×1 IMPLANT
SUT ETHILON 3 0 PS 1 (SUTURE) ×5 IMPLANT
SUT FIBERWIRE #2 38 T-5 BLUE (SUTURE)
SUT MNCRL AB 3-0 PS2 18 (SUTURE) ×2 IMPLANT
SUT VIC AB 0 SH 27 (SUTURE) IMPLANT
SUT VIC AB 2-0 SH 27 (SUTURE) ×3
SUT VIC AB 2-0 SH 27XBRD (SUTURE) ×1 IMPLANT
SUTURE FIBERWR #2 38 T-5 BLUE (SUTURE) IMPLANT
SYR BULB 3OZ (MISCELLANEOUS) ×3 IMPLANT
SYR CONTROL 10ML LL (SYRINGE) IMPLANT
TOWEL GREEN STERILE FF (TOWEL DISPOSABLE) ×6 IMPLANT
TUBE CONNECTING 20'X1/4 (TUBING) ×1
TUBE CONNECTING 20X1/4 (TUBING) ×2 IMPLANT
UNDERPAD 30X30 (UNDERPADS AND DIAPERS) ×3 IMPLANT

## 2018-07-26 NOTE — Anesthesia Preprocedure Evaluation (Signed)
Anesthesia Evaluation  Patient identified by MRN, date of birth, ID band Patient awake    Reviewed: Allergy & Precautions, NPO status , Patient's Chart, lab work & pertinent test results  Airway Mallampati: II  TM Distance: >3 FB     Dental   Pulmonary asthma , COPD,    breath sounds clear to auscultation- rhonchi       Cardiovascular hypertension, Pt. on home beta blockers + CAD  + dysrhythmias  Rhythm:Regular Rate:Normal     Neuro/Psych Anxiety Depression  Neuromuscular disease    GI/Hepatic Neg liver ROS, hiatal hernia, GERD  ,  Endo/Other  negative endocrine ROS  Renal/GU negative Renal ROS     Musculoskeletal  (+) Arthritis , Fibromyalgia -  Abdominal   Peds  Hematology negative hematology ROS (+)   Anesthesia Other Findings   Reproductive/Obstetrics                             Anesthesia Physical Anesthesia Plan  ASA: III  Anesthesia Plan: General   Post-op Pain Management: GA combined w/ Regional for post-op pain   Induction: Intravenous  PONV Risk Score and Plan: 3 and Midazolam, Dexamethasone, Ondansetron and Treatment may vary due to age or medical condition  Airway Management Planned: LMA  Additional Equipment:   Intra-op Plan:   Post-operative Plan: Extubation in OR  Informed Consent: I have reviewed the patients History and Physical, chart, labs and discussed the procedure including the risks, benefits and alternatives for the proposed anesthesia with the patient or authorized representative who has indicated his/her understanding and acceptance.   Dental advisory given  Plan Discussed with: CRNA  Anesthesia Plan Comments:         Anesthesia Quick Evaluation

## 2018-07-26 NOTE — Anesthesia Postprocedure Evaluation (Signed)
Anesthesia Post Note  Patient: Zoe Sanchez  Procedure(s) Performed: Left fibula osteotomy; revision syndesmosis open reduction internal fixation; revision medial malleolus open reduction internal fixation (Left Ankle)     Patient location during evaluation: PACU Anesthesia Type: General Level of consciousness: awake and alert Pain management: pain level controlled Vital Signs Assessment: post-procedure vital signs reviewed and stable Respiratory status: spontaneous breathing, nonlabored ventilation and respiratory function stable Cardiovascular status: blood pressure returned to baseline and stable Postop Assessment: no apparent nausea or vomiting Anesthetic complications: no    Last Vitals:  Vitals:   07/26/18 1400 07/26/18 1420  BP: 115/62 137/61  Pulse: 82 86  Resp: 11 20  Temp:  36.5 C  SpO2: 94% 98%    Last Pain:  Vitals:   07/26/18 1420  TempSrc:   PainSc: 0-No pain                 Lynda Rainwater

## 2018-07-26 NOTE — H&P (Signed)
Zoe Sanchez is an 62 y.o. female.   Chief Complaint: Left ankle pain HPI: The patient is a 62 year old female with an extensive past medical history.  This includes chronic pain managed with opioids.  She fractured her left ankle and underwent open treatment several months ago.  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  She continues to have ankle pain due to the malunited ankle fracture.  She presents now for operative treatment of this condition.  Past Medical History:  Diagnosis Date  . Abnormal heart rhythm   . Abnormal mammogram 02/25/2014  . Anemia    vitamin b12 and vitamin d deficiency  . Anxiety   . Asthma   . Awareness of heartbeats 06/29/2015  . Breath shortness 06/29/2015  . CAD (coronary artery disease)   . Cancer ([place])    skin cancers  . Cervical radicular pain (Location of Secondary source of pain) (Bilateral) (R>L) (C8 Dermatome) 06/29/2015  . Chronic pain   . COPD (chronic obstructive pulmonary disease) ([place])   . Degenerative disk disease   . Depression   . Dysrhythmia    svt  . Endometriosis   . Family history of chronic pain 06/29/2015  . Fibromyalgia   . Fibromyalgia   . GERD (gastroesophageal reflux disease)   . Heart murmur   . Hiatal hernia   . History of attempted suicide 06/29/2015  . History of hiatal hernia 06/29/2015  . History of psychiatric disorder 06/29/2015  . History of suicidal ideation 06/29/2015  . Hyperlipidemia   . Insomnia   . Insomnia   . Irritable bowel syndrome     Past Surgical History:  Procedure Laterality Date  . ABDOMINAL HYSTERECTOMY  1986  . BREAST BIOPSY Right 2016   CORE W/CLIP - EARLY INTRADUCTAL PAPILLOMA  . BREAST SURGERY    . OOPHORECTOMY  1986   same time as hysterectomy  . ORIF ANKLE FRACTURE Left 03/14/2018   Procedure: OPEN REDUCTION INTERNAL FIXATION (ORIF) ANKLE FRACTURE;  Surgeon: Lovell Sheehan,  MD;  Location: [facility] ORS;  Service: Orthopedics;  Laterality: Left;  . SKIN LESION EXCISION     basal cell and squamous cell  . TONSILLECTOMY  1957    Family History  Problem Relation Age of Onset  . Heart disease Father   . Hypertension Father   . Hypertension Mother   . Breast cancer Maternal Aunt    Social History:  reports that she has never smoked. She has never used smokeless tobacco. She reports that she does not drink alcohol or use drugs.  Allergies:  Allergies  Allergen Reactions  . Prednisone Palpitations    Medications Prior to Admission  Medication Sig Dispense Refill  . amitriptyline (ELAVIL) 100 MG tablet Take 200 mg by mouth at bedtime.     . Cholecalciferol (VITAMIN D3) 50 MCG (2000 UT) capsule Take 1 capsule (2,000 Units total) by mouth daily. 30 capsule 2  . cyanocobalamin ([facility]S VITAMIN B12) 2000 MCG tablet Take 1 tablet (2,000 mcg total) by mouth daily. 30 tablet 2  . cyclobenzaprine (FLEXERIL) 5 MG tablet Take 1 tablet (5 mg total) by mouth 4 (four) times daily. 120 tablet 2  . dicyclomine (BENTYL) 20 MG tablet Take 20 mg by mouth daily as needed for spasms.     Marland Kitchen estradiol (ESTRACE) 1 MG tablet Take 1 mg by mouth daily.     Marland Kitchen  fluticasone (FLONASE) 50 MCG/ACT nasal spray Place 2 sprays into both nostrils daily.     Marland Kitchen gemfibrozil (LOPID) 600 MG tablet Take 600 mg by mouth 2 (two) times daily before a meal.     . [START ON 09/27/2018] HYDROcodone-acetaminophen (NORCO/VICODIN) 5-325 MG tablet Take 1 tablet by mouth every 4 (four) hours as needed for severe pain. 180 tablet 0  . INCRUSE ELLIPTA 62.5 MCG/INH AEPB Inhale 1 puff into the lungs daily.     . magnesium gluconate (MAGONATE) 500 MG tablet Take 500 mg by mouth daily.    . Melatonin 10 MG CAPS Take 20 mg by mouth at bedtime as needed. 60 capsule 2  . metroNIDAZOLE (FLAGYL) 500 MG tablet Take 500 mg by mouth 2 (two) times daily.    . montelukast (SINGULAIR) 10 MG tablet Take by mouth at bedtime.     Marland Kitchen  omeprazole (PRILOSEC) 20 MG capsule Take 20 mg by mouth 2 (two) times daily before a meal.     . pregabalin (LYRICA) 100 MG capsule Take 1 capsule (100 mg total) by mouth at bedtime. 30 capsule 2  . propranolol (INDERAL) 40 MG tablet TAKE 40 - 280MG  BY MOUTH DAILY AS NEEDED    . propranolol ER (INDERAL LA) 80 MG 24 hr capsule TAKE 2 CAPSULES BY MOUTH ONCE DAILY at bedtime    . salmeterol (SEREVENT DISKUS) 50 MCG/DOSE diskus inhaler Inhale 1 puff into the lungs daily.     Marland Kitchen zolpidem (AMBIEN) 10 MG tablet Take 10 mg by mouth at bedtime.     Derrill Memo ON 08/28/2018] HYDROcodone-acetaminophen (NORCO/VICODIN) 5-325 MG tablet Take 1 tablet by mouth every 4 (four) hours as needed for severe pain. 180 tablet 0  . pregabalin (LYRICA) 25 MG capsule Take 1 capsule (25 mg total) by mouth 2 (two) times daily. 60 capsule 2    No results found for this or any previous visit (from the past 48 hour(s)). No results found.   ROS no recent fever, chills, nausea, vomiting or changes in her appetite  Blood pressure (!) 108/47, pulse 76, temperature 98.1 F (36.7 C), temperature source Oral, resp. rate 20, height 5\' 3"  (1.6 m), weight 72.8 kg, SpO2 97 %. Physical Exam  Well-nourished well-developed woman in no apparent distress.  Alert and oriented x4.  Mood and affect are normal.  Extraocular motions are intact.  Respirations are unlabored.  Left ankle has valgus malalignment.  Surgical incisions are healed.  Skin is otherwise healthy and intact.  Intact sensibility to light touch dorsally and plantarly at the foot.  5 out of 5 strength in plantar flexion and dorsiflexion of the ankle and toes.  No lymphadenopathy.  Palpable dorsalis pedis and posterior tibial pulses.  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  The risks and benefits of the alternative treatment options have been discussed in detail.  The patient wishes to proceed with surgery and specifically understands risks of bleeding, infection, nerve damage, blood clots, need for additional surgery, amputation and death.   Wylene Simmer, MD 08/20/2018, 10:43 AM

## 2018-07-26 NOTE — Op Note (Signed)
07/26/2018  1:43 PM  PATIENT:  Zoe Sanchez  62 y.o. female  PRE-OPERATIVE DIAGNOSIS: 1.  Left fibular malunion 2.  Left ankle deltoid ligament rupture 3.  Left ankle syndesmosis disruption  POST-OPERATIVE DIAGNOSIS: Same  Procedure(s): 1.  Removal of deep implants from the left fibula 2.  Open treatment of left fibular malunion with internal fixation 3.  Open treatment of left ankle syndesmosis disruption with internal fixation 4.  Left ankle deltoid ligament reconstruction through separate incision 5.  AP, lateral and mortise radiographs of the left ankle  SURGEON:  Wylene Simmer, MD  ASSISTANT: Mechele Claude, PA-C  ANESTHESIA:   General, regional  EBL:  minimal   TOURNIQUET:   Total Tourniquet Time Documented: Thigh (Left) - 120 minutes Total: Thigh (Left) - 299 minutes  COMPLICATIONS:  None apparent  DISPOSITION:  Extubated, awake and stable to recovery.  INDICATION FOR PROCEDURE: The patient is a 62 year old female with multiple comorbidities including chronic pain requiring opioid management.  She injured her ankle back in July of this year.  This was a trimalleolar fracture with syndesmosis disruption.  She underwent open treatment with internal fixation.  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  She has failed nonoperative treatment to date and presents for surgical correction of these painful left ankle conditions.  The risks and benefits of the alternative treatment options have been discussed in detail.  The patient wishes to proceed with surgery and specifically understands risks of bleeding, infection, nerve damage, blood clots, need for additional surgery, amputation and death.  PROCEDURE IN DETAIL: After preoperative consent was obtained and the correct operative site was identified, the patient was brought to the operating room and placed  upon the operating table.  General anesthesia was induced.  Preoperative antibiotics were administered.  A surgical timeout was taken.  The left lower extremity was prepped and draped in standard sterile fashion with a tourniquet around the thigh.  The extremity was elevated and the tourniquet was inflated to 250 mmHg.  Patient's lateral incision was identified.  It was opened again sharply and dissection was carried down through the subcutaneous tissues to the lateral aspect of the plate.  All of the screw heads were exposed and fibrous tissue excised.  Screws were all removed.  The lag screw was identified posteriorly and was also removed without difficulty.  The tight rope washer was identified laterally.  It was cut free from the suture and removed.  Attention was turned to the medial aspect of the ankle where a longitudinal incision was made over the medial malleolus.  Dissection was carried down through the subcutaneous tissues.  The suture button was identified at the medial tibia.  It was grasped and pulled out of the tunnel along with all of the suture.  The deltoid ligament was noted to be ruptured anteriorly.  This area was debrided of all fibrous tissue exposing the medial gutter.  The gutter was also debrided of all scar tissue that was blocking reduction.  The ankle joint was opened with a joker elevator.  The anterior capsule was noted to be extremely scarred.  It was mobilized with subperiosteal dissection over the anterior aspect of the tibia.  Attention was then returned to the lateral wound.  The syndesmosis was opened and cleaned of all scar tissue.  There was synostosis anteriorly blocking reduction of the syndesmosis.  This was removed with  a rondure.  The lateral gutter was also cleared of soft tissue and the lateral and anterior joint capsule were mobilized allowing free motion of the talus within the ankle mortise.    The fibula was noted to be mostly healed but the fracture site  could be identified over the anterior one third of the fibula.  The fibula was noted to be healed in a shortened and externally rotated valgus position.  An osteotome was used to create an osteotomy across the previous fracture site.  The fibrous tissue was all removed with a rondure.  The incision was further visualized after osteotomy of the fibula.  It was cleaned of all remaining soft tissue.  The wound was then irrigated.  The fibula was pulled out to length and rotated internally to restore appropriate alignment.  It was held with a tenaculum.  AP, mortise and lateral radiographs confirmed appropriate reduction of the fibula.  It was then fixed with a 3.5 mm lag screw placed from anterior to posterior across the osteotomy.  A 10 hole one third tubular plate from the [employer] small frag set was then contoured to fit the lateral fibula.  It was secured distally with 3 unicortical screws and proximally with 3 bicortical screws.  The syndesmosis was then reduced under direct vision and held with a King tong clamp.  AP, mortise and lateral radiographs confirmed appropriate reduction of the joint.  The syndesmosis was then fixed with two3.5 millimeter screws placed across all 4 cortices of the distal fibula and tibia.  Radiographs confirmed appropriate alignment of the ankle mortise and appropriate position and length of all lateral hardware.  The talus was still noted to be tilted into valgus confirming incompetence of the deltoid ligament where it had been sprained.  The cortical bone of the anterior aspect of the medial malleolus was removed with a rondure.  A juggernaut anchor was inserted into the medial malleolus and was noted to have excellent purchase.  The 2 limbs of suture were then passed through the remnant of the deltoid ligament distally.  The ligament was pulled up onto the bone while holding the ankle in a reduced and inverted position.  0 Vicryl sutures were used to further repair the deltoid  ligament to the periosteum of the medial malleolus.  Final AP, lateral and mortise radiographs of the ankle showed appropriate position and length of all hardware and appropriate reduction of the ankle mortise.  Medial and lateral wounds were irrigated copiously.  Deep subcutaneous tissues were approximated with Vicryl.  Superficial subcutaneous tissues were approximated with Monocryl.  Skin incisions were closed with nylon.  Sterile dressings were applied followed by a well-padded short leg splint.  The tourniquet was released after application of the dressings.  The patient was awakened from anesthesia and transported to the recovery room in stable condition.   FOLLOW UP PLAN: Nonweightbearing on the left lower extremity.  Follow-up in 2 weeks for suture removal and conversion to a short leg cast with the foot positioned in varus and inversion.  Aspirin for DVT prophylaxis.  Oxycodone 5 mg #20 Sig: 1 p.o. every 6 hours as needed severe pain.   RADIOGRAPHS: AP, mortise and lateral radiographs of the left ankle are obtained intraoperatively.  These show interval osteotomy of the fibula and reduction of the syndesmosis with internal fixation.  The medial gutter is appropriately aligned.  No other acute injuries are noted.    Mechele Claude PA-C was present and scrubbed for the duration of  the operative case. His assistance was essential in positioning the patient, prepping and draping, gaining and maintaining exposure, performing the operation, closing and dressing the wounds and applying the splint.

## 2018-07-26 NOTE — Anesthesia Procedure Notes (Signed)
Procedure Name: LMA Insertion Date/Time: 07/26/2018 11:06 AM Performed by: Signe Colt, CRNA Pre-anesthesia Checklist: Patient identified, Emergency Drugs available, Suction available and Patient being monitored Patient Re-evaluated:Patient Re-evaluated prior to induction Oxygen Delivery Method: Circle system utilized Preoxygenation: Pre-oxygenation with 100% oxygen Induction Type: IV induction Ventilation: Mask ventilation without difficulty LMA: LMA inserted LMA Size: 3.0 Number of attempts: 1 Airway Equipment and Method: Bite block Placement Confirmation: positive ETCO2 Tube secured with: Tape Dental Injury: Teeth and Oropharynx as per pre-operative assessment

## 2018-07-26 NOTE — Anesthesia Procedure Notes (Signed)
Anesthesia Regional Block: Popliteal block   Pre-Anesthetic Checklist: ,, timeout performed, Correct Patient, Correct Site, Correct Laterality, Correct Procedure, Correct Position, site marked, Risks and benefits discussed,  Surgical consent,  Pre-op evaluation,  At surgeon's request and post-op pain management  Laterality: Left  Prep: chloraprep       Needles:  Injection technique: Single-shot  Needle Type: Echogenic Needle     Needle Length: 9cm  Needle Gauge: 21     Additional Needles:   Procedures:, nerve stimulator,,, ultrasound used (permanent image in chart),,,,   Nerve Stimulator or Paresthesia:  Response: tibial, 0.5 mA,   Additional Responses:   Narrative:  Start time: 07/26/2018 10:02 AM End time: 07/26/2018 10:08 AM Injection made incrementally with aspirations every 5 mL.  Performed by: Personally  Anesthesiologist: Suzette Battiest, MD

## 2018-07-26 NOTE — Progress Notes (Signed)
Assisted Dr. Rob Fitzgerald with left, ultrasound guided, popliteal/saphenous block. Side rails up, monitors on throughout procedure. See vital signs in flow sheet. Tolerated Procedure well. 

## 2018-07-26 NOTE — Transfer of Care (Signed)
Immediate Anesthesia Transfer of Care Note  Patient: Zoe Sanchez  Procedure(s) Performed: Left fibula osteotomy; revision syndesmosis open reduction internal fixation; revision medial malleolus open reduction internal fixation (Left Ankle)  Patient Location: PACU  Anesthesia Type:GA combined with regional for post-op pain  Level of Consciousness: drowsy and patient cooperative  Airway & Oxygen Therapy: Patient Spontanous Breathing and Patient connected to face mask oxygen  Post-op Assessment: Report given to RN and Post -op Vital signs reviewed and stable  Post vital signs: Reviewed and stable  Last Vitals:  Vitals Value Taken Time  BP 118/54 07/26/2018  1:30 PM  Temp    Pulse 80 07/26/2018  1:33 PM  Resp 19 07/26/2018  1:33 PM  SpO2 100 % 07/26/2018  1:33 PM  Vitals shown include unvalidated device data.  Last Pain:  Vitals:   07/26/18 0943  TempSrc: Oral  PainSc: 2       Patients Stated Pain Goal: 3 (29/51/88 4166)  Complications: No apparent anesthesia complications

## 2018-07-26 NOTE — Anesthesia Procedure Notes (Signed)
Anesthesia Regional Block: Adductor canal block   Pre-Anesthetic Checklist: ,, timeout performed, Correct Patient, Correct Site, Correct Laterality, Correct Procedure, Correct Position, site marked, Risks and benefits discussed,  Surgical consent,  Pre-op evaluation,  At surgeon's request and post-op pain management  Laterality: Left  Prep: chloraprep       Needles:  Injection technique: Single-shot  Needle Type: Echogenic Needle     Needle Length: 9cm  Needle Gauge: 21     Additional Needles:   Procedures:,,,, ultrasound used (permanent image in chart),,,,  Narrative:  Start time: 07/26/2018 10:09 AM End time: 07/26/2018 10:14 AM Injection made incrementally with aspirations every 5 mL.  Performed by: Personally  Anesthesiologist: Suzette Battiest, MD

## 2018-07-26 NOTE — Discharge Instructions (Addendum)
Post Anesthesia Home Care Instructions  Activity: Get plenty of rest for the remainder of the day. A responsible individual must stay with you for 24 hours following the procedure.  For the next 24 hours, DO NOT: -Drive a car -Paediatric nurse -Drink alcoholic beverages -Take any medication unless instructed by your physician -Make any legal decisions or sign important papers.  Meals: Start with liquid foods such as gelatin or soup. Progress to regular foods as tolerated. Avoid greasy, spicy, heavy foods. If nausea and/or vomiting occur, drink only clear liquids until the nausea and/or vomiting subsides. Call your physician if vomiting continues.  Special Instructions/Symptoms: Your throat may feel dry or sore from the anesthesia or the breathing tube placed in your throat during surgery. If this causes discomfort, gargle with warm salt water. The discomfort should disappear within 24 hours.  If you had a scopolamine patch placed behind your ear for the management of post- operative nausea and/or vomiting:  1. The medication in the patch is effective for 72 hours, after which it should be removed.  Wrap patch in a tissue and discard in the trash. Wash hands thoroughly with soap and water. 2. You may remove the patch earlier than 72 hours if you experience unpleasant side effects which may include dry mouth, dizziness or visual disturbances. 3. Avoid touching the patch. Wash your hands with soap and water after contact with the patch.      Regional Anesthesia Blocks  1. Numbness or the inability to move the "blocked" extremity may last from 3-48 hours after placement. The length of time depends on the medication injected and your individual response to the medication. If the numbness is not going away after 48 hours, call your surgeon.  2. The extremity that is blocked will need to be protected until the numbness is gone and the  Strength has returned. Because you cannot feel it, you  will need to take extra care to avoid injury. Because it may be weak, you may have difficulty moving it or using it. You may not know what position it is in without looking at it while the block is in effect.  3. For blocks in the legs and feet, returning to weight bearing and walking needs to be done carefully. You will need to wait until the numbness is entirely gone and the strength has returned. You should be able to move your leg and foot normally before you try and bear weight or walk. You will need someone to be with you when you first try to ensure you do not fall and possibly risk injury.  4. Bruising and tenderness at the needle site are common side effects and will resolve in a few days.  5. Persistent numbness or new problems with movement should be communicated to the surgeon or the [facility] [phone] [place] [phone]).    Wylene Simmer, MD [facility]  Please read the following information regarding your care after surgery.  Medications  You only need a prescription for the narcotic pain medicine (ex. oxycodone, Percocet, Norco).  All of the other medicines listed below are available over the counter. X Aleve 2 pills twice a day for the first 3 days after surgery. X acetominophen (Tylenol) 650 mg every 4-6 hours as you need for minor to moderate pain X oxycodone as prescribed for severe pain X follow pain control per recommendation of your pain management specialist  Narcotic pain medicine (ex. oxycodone, Percocet, Vicodin) will cause  constipation.  To prevent this problem, take the following medicines while you are taking any pain medicine. X docusate sodium (Colace) 100 mg twice a day X senna (Senokot) 2 tablets twice a day  X To help prevent blood clots, take a baby aspirin (81 mg) twice a day after surgery.  You should also get up every hour while you are awake to move around.    Weight Bearing X Do not bear any weight  on the operated leg or foot.  Cast / Splint / Dressing X Keep your splint, cast or dressing clean and dry.  Dont put anything (coat hanger, pencil, etc) down inside of it.  If it gets damp, use a hair dryer on the cool setting to dry it.  If it gets soaked, call the office to schedule an appointment for a cast change.   After your dressing, cast or splint is removed; you may shower, but do not soak or scrub the wound.  Allow the water to run over it, and then gently pat it dry.  Swelling It is normal for you to have swelling where you had surgery.  To reduce swelling and pain, keep your toes above your nose for at least 3 days after surgery.  It may be necessary to keep your foot or leg elevated for several weeks.  If it hurts, it should be elevated.  Follow Up Call my office at [phone] when you are discharged from the hospital or surgery center to schedule an appointment to be seen two weeks after surgery.  Call my office at [phone] if you develop a fever >101.5 F, nausea, vomiting, bleeding from the surgical site or severe pain.

## 2018-07-27 ENCOUNTER — Encounter (HOSPITAL_BASED_OUTPATIENT_CLINIC_OR_DEPARTMENT_OTHER): Payer: Self-pay | Admitting: Orthopedic Surgery

## 2018-07-28 LAB — TOXASSURE SELECT 13 (MW), URINE

## 2018-08-01 ENCOUNTER — Encounter: Payer: Medicare Other | Admitting: Nurse Practitioner

## 2018-08-07 ENCOUNTER — Encounter: Payer: Medicare Other | Admitting: Nurse Practitioner

## 2018-10-23 ENCOUNTER — Ambulatory Visit: Payer: Medicare Other | Attending: Nurse Practitioner | Admitting: Nurse Practitioner

## 2018-10-23 ENCOUNTER — Encounter: Payer: Self-pay | Admitting: Nurse Practitioner

## 2018-10-23 ENCOUNTER — Other Ambulatory Visit: Payer: Self-pay

## 2018-10-23 VITALS — BP 134/62 | HR 68 | Temp 97.9°F | Resp 16 | Ht 62.0 in | Wt 155.0 lb

## 2018-10-23 DIAGNOSIS — M797 Fibromyalgia: Secondary | ICD-10-CM | POA: Insufficient documentation

## 2018-10-23 DIAGNOSIS — E538 Deficiency of other specified B group vitamins: Secondary | ICD-10-CM | POA: Diagnosis present

## 2018-10-23 DIAGNOSIS — M47812 Spondylosis without myelopathy or radiculopathy, cervical region: Secondary | ICD-10-CM | POA: Diagnosis present

## 2018-10-23 DIAGNOSIS — E559 Vitamin D deficiency, unspecified: Secondary | ICD-10-CM

## 2018-10-23 DIAGNOSIS — G894 Chronic pain syndrome: Secondary | ICD-10-CM | POA: Insufficient documentation

## 2018-10-23 DIAGNOSIS — M47816 Spondylosis without myelopathy or radiculopathy, lumbar region: Secondary | ICD-10-CM | POA: Diagnosis not present

## 2018-10-23 MED ORDER — VITAMIN D3 50 MCG (2000 UT) PO CAPS: 2000.0000 [IU] | ORAL_CAPSULE | Freq: Every day | ORAL | 2 refills | Status: DC

## 2018-10-23 MED ORDER — PREGABALIN 25 MG PO CAPS: 25.0000 mg | ORAL_CAPSULE | Freq: Two times a day (BID) | ORAL | 2 refills | Status: DC

## 2018-10-23 MED ORDER — CYANOCOBALAMIN 2000 MCG PO TABS: 2000.0000 ug | ORAL_TABLET | Freq: Every day | ORAL | 2 refills | Status: DC

## 2018-10-23 MED ORDER — HYDROCODONE-ACETAMINOPHEN 5-325 MG PO TABS: 1.0000 | ORAL_TABLET | ORAL | 0 refills | Status: DC | PRN

## 2018-10-23 MED ORDER — PREGABALIN 100 MG PO CAPS: 100.0000 mg | ORAL_CAPSULE | Freq: Every day | ORAL | 2 refills | Status: DC

## 2018-10-23 MED ORDER — MELATONIN 10 MG PO CAPS: 20.0000 mg | ORAL_CAPSULE | Freq: Every evening | ORAL | 2 refills | Status: DC | PRN

## 2018-10-23 MED ORDER — CYCLOBENZAPRINE HCL 5 MG PO TABS: 5.0000 mg | ORAL_TABLET | Freq: Four times a day (QID) | ORAL | 2 refills | Status: DC

## 2018-10-23 NOTE — Progress Notes (Signed)
Nursing Pain Medication Assessment:  Safety precautions to be maintained throughout the outpatient stay will include: orient to surroundings, keep bed in low position, maintain call bell within reach at all times, provide assistance with transfer out of bed and ambulation.  Medication Inspection Compliance: Pill count conducted under aseptic conditions, in front of the patient. Neither the pills nor the bottle was removed from the patient's sight at any time. Once count was completed pills were immediately returned to the patient in their original bottle.  Medication: Hydrocodone/APAP Pill/Patch Count: 33 of 180 pills remain Pill/Patch Appearance: Markings consistent with prescribed medication Bottle Appearance: Standard pharmacy container. Clearly labeled. Filled Date: 1 / 38 / 2020 Last Medication intake:  Today

## 2018-10-23 NOTE — Patient Instructions (Signed)
____________________________________________________________________________________________  Medication Rules  Purpose: To inform patients, and their family members, of our rules and regulations.  Applies to: All patients receiving prescriptions (written or electronic).  Pharmacy of record: Pharmacy where electronic prescriptions will be sent. If written prescriptions are taken to a different pharmacy, please inform the nursing staff. The pharmacy listed in the electronic medical record should be the one where you would like electronic prescriptions to be sent.  Electronic prescriptions: In compliance with the [place] Strengthen Opioid Misuse Prevention (STOP) Act of 2017 (Session Law 2017-74/H243), effective September 12, 2018, all controlled substances must be electronically prescribed. Calling prescriptions to the pharmacy will cease to exist.  Prescription refills: Only during scheduled appointments. Applies to all prescriptions.  NOTE: The following applies primarily to controlled substances (Opioid* Pain Medications).   Patient's responsibilities: 1. Pain Pills: Bring all pain pills to every appointment (except for procedure appointments). 2. Pill Bottles: Bring pills in original pharmacy bottle. Always bring the newest bottle. Bring bottle, even if empty. 3. Medication refills: You are responsible for knowing and keeping track of what medications you take and those you need refilled. The day before your appointment: write a list of all prescriptions that need to be refilled. The day of the appointment: give the list to the admitting nurse. Prescriptions will be written only during appointments. No prescriptions will be written on procedure days. If you forget a medication: it will not be "Called in", "Faxed", or "electronically sent". You will need to get another appointment to get these prescribed. No early refills. Do not call asking to have your prescription filled  early. 4. Prescription Accuracy: You are responsible for carefully inspecting your prescriptions before leaving our office. Have the discharge nurse carefully go over each prescription with you, before taking them home. Make sure that your name is accurately spelled, that your address is correct. Check the name and dose of your medication to make sure it is accurate. Check the number of pills, and the written instructions to make sure they are clear and accurate. Make sure that you are given enough medication to last until your next medication refill appointment. 5. Taking Medication: Take medication as prescribed. When it comes to controlled substances, taking less pills or less frequently than prescribed is permitted and encouraged. Never take more pills than instructed. Never take medication more frequently than prescribed.  6. Inform other Doctors: Always inform, all of your healthcare providers, of all the medications you take. 7. Pain Medication from other Providers: You are not allowed to accept any additional pain medication from any other Doctor or Healthcare provider. There are two exceptions to this rule. (see below) In the event that you require additional pain medication, you are responsible for notifying us, as stated below. 8. Medication Agreement: You are responsible for carefully reading and following our Medication Agreement. This must be signed before receiving any prescriptions from our practice. Safely store a copy of your signed Agreement. Violations to the Agreement will result in no further prescriptions. (Additional copies of our Medication Agreement are available upon request.) 9. Laws, Rules, & Regulations: All patients are expected to follow all Federal and State Laws, Statutes, Rules, & Regulations. Ignorance of the Laws does not constitute a valid excuse. The use of any illegal substances is prohibited. 10. Adopted CDC guidelines & recommendations: Target dosing levels will be  at or below 60 MME/day. Use of benzodiazepines** is not recommended.  Exceptions: There are only two exceptions to the rule of not   receiving pain medications from other Healthcare Providers. 1. Exception #1 (Emergencies): In the event of an emergency (i.e.: accident requiring emergency care), you are allowed to receive additional pain medication. However, you are responsible for: As soon as you are able, call our office [phone], at any time of the day or night, and leave a message stating your name, the date and nature of the emergency, and the name and dose of the medication prescribed. In the event that your call is answered by a member of our staff, make sure to document and save the date, time, and the name of the person that took your information.  2. Exception #2 (Planned Surgery): In the event that you are scheduled by another doctor or dentist to have any type of surgery or procedure, you are allowed (for a period no longer than 30 days), to receive additional pain medication, for the acute post-op pain. However, in this case, you are responsible for picking up a copy of our "Post-op Pain Management for Surgeons" handout, and giving it to your surgeon or dentist. This document is available at our office, and does not require an appointment to obtain it. Simply go to our office during business hours (Monday-Thursday from 8:00 AM to 4:00 PM) (Friday 8:00 AM to 12:00 Noon) or if you have a scheduled appointment with us, prior to your surgery, and ask for it by name. In addition, you will need to provide us with your name, name of your surgeon, type of surgery, and date of procedure or surgery.  *Opioid medications include: morphine, codeine, oxycodone, oxymorphone, hydrocodone, hydromorphone, meperidine, tramadol, tapentadol, buprenorphine, fentanyl, methadone. **Benzodiazepine medications include: diazepam (Valium), alprazolam (Xanax), clonazepam (Klonopine), lorazepam (Ativan), clorazepate  (Tranxene), chlordiazepoxide (Librium), estazolam (Prosom), oxazepam (Serax), temazepam (Restoril), triazolam (Halcion) (Last updated: 11/09/2017) ____________________________________________________________________________________________    

## 2018-10-23 NOTE — Progress Notes (Signed)
Patient's Name: Zoe Sanchez  MRN: [id number]  Referring Provider: Denton Lank, MD  DOB: 09-15-1955  PCP: Denton Lank, MD  DOS: 10/23/2018  Note by: Dionisio David, NP  Service setting: Ambulatory outpatient  Specialty: Interventional Pain Management  Location: [facility] (AMB) Pain Management Facility    Patient type: Established   HPI  Reason for Visit: Zoe Sanchez is a 63 y.o. year old, female patient, who comes today with a chief complaint of Back Pain (lower) and Neck Pain Last Appointment: She was last seen by me on 07/23/2018. Pain Assessment: Today, Zoe Sanchez describes the severity of the Chronic pain as a 2 /10. She indicates the location/referral of the pain to be Back Lower, Right, Left/hips bilaterally . Onset was: More than a month ago. The quality of pain is described as Aching, Constant, Discomfort(Screaming pain, my toes fell like stinging, and numbing). Temporal description, or timing of pain is: Constant. Possible modifying factors: medication, rest, elev feet. Zoe Sanchez describes the pain effects on ADL as: Daily activites.  Zoe Sanchez  height is '5\' 2"'  (1.575 m) and weight is 155 lb (70.3 kg). Her temperature is 97.9 F (36.6 C). Her blood pressure is 134/62 and her pulse is 68. Her respiration is 16 and oxygen saturation is 99%.  She feels like the back pain seems to be worse with having to walk with the boot. She feels like she is having increased stabbing pain in her feet. She is doing a little better now. She continues to have the neck also because of stress and strain. She does not want any interventional therapy.  Controlled Substance Pharmacotherapy Assessment REMS (Risk Evaluation and Mitigation Strategy)  Analgesic:Hydrocodone/APAP 5/325 one every4 hours (6 per day) (30 mg/day) MME/day:30 mg/day. Zoe Specking, RN  10/23/2018  2:37 PM  Sign when Signing Visit Nursing Pain Medication Assessment:  Safety precautions to be maintained throughout the  outpatient stay will include: orient to surroundings, keep bed in low position, maintain call bell within reach at all times, provide assistance with transfer out of bed and ambulation.  Medication Inspection Compliance: Pill count conducted under aseptic conditions, in front of the patient. Neither the pills nor the bottle was removed from the patient's sight at any time. Once count was completed pills were immediately returned to the patient in their original bottle.  Medication: Hydrocodone/APAP Pill/Patch Count: 33 of 180 pills remain Pill/Patch Appearance: Markings consistent with prescribed medication Bottle Appearance: Standard pharmacy container. Clearly labeled. Filled Date: 1 / 19 / 2020 Last Medication intake:  Today   Pharmacokinetics: Liberation and absorption (onset of action): WNL Distribution (time to peak effect): WNL Metabolism and excretion (duration of action): WNL         Pharmacodynamics: Desired effects: Analgesia: Zoe Sanchez reports >50% benefit. Functional ability: Patient reports that medication allows her to accomplish basic ADLs Clinically meaningful improvement in function (CMIF): Sustained CMIF goals met Perceived effectiveness: Described as relatively effective, allowing for increase in activities of daily living (ADL) Undesirable effects: Side-effects or Adverse reactions: None reported Monitoring: [place] PMP: Online review of the past 50-monthperiod conducted. Compliant with practice rules and regulations Last UDS on record: Summary  Date Value Ref Range Status  07/23/2018 FINAL  Final    Comment:    ==================================================================== TOXASSURE SSELECT 33(MW) ==================================================================== Test                             Result  Flag       Units Drug Present and Declared for Prescription Verification   Hydrocodone                    3119         EXPECTED   ng/mg creat    Hydromorphone                  119          EXPECTED   ng/mg creat   Dihydrocodeine                 725          EXPECTED   ng/mg creat   Norhydrocodone                 3244         EXPECTED   ng/mg creat    Sources of hydrocodone include scheduled prescription    medications. Hydromorphone, dihydrocodeine and norhydrocodone are    expected metabolites of hydrocodone. Hydromorphone and    dihydrocodeine are also available as scheduled prescription    medications. ==================================================================== Test                      Result    Flag   Units      Ref Range   Creatinine              63               mg/dL      >=20 ==================================================================== Declared Medications:  The flagging and interpretation on this report are based on the  following declared medications.  Unexpected results may arise from  inaccuracies in the declared medications.  **Note: The testing scope of this panel includes these medications:  Hydrocodone (Hydrocodone-Acetaminophen)  **Note: The testing scope of this panel does not include following  reported medications:  Acetaminophen (Hydrocodone-Acetaminophen)  Amitriptyline (Elavil)  Cyanocobalamin  Cyclobenzaprine  Dicyclomine (Bentyl)  Estradiol (Estrace)  Fluticasone (Flonase)  Gemfibrozil (Lopid)  Magnesium (Magonate)  Melatonin  Metronidazole (Flagyl)  Montelukast (Singulair)  Omeprazole (Prilosec)  Pregabalin (Lyrica)  Propranolol (Inderal)  Salmeterol (Serevent)  Umeclidinium (Incruse Ellipta)  Vitamin D3  Zolpidem (Ambien) ==================================================================== For clinical consultation, please call [phone]. ====================================================================    UDS interpretation: Compliant          Medication Assessment Form: Reviewed. Patient indicates being compliant with therapy Treatment compliance:  Compliant Risk Assessment Profile: Aberrant behavior: See initial evaluations. None observed or detected today Comorbid factors increasing risk of overdose: See initial evaluation. No additional risks detected today Opioid risk tool (ORT):  Opioid Risk  10/23/2018  Alcohol 0  Illegal Drugs 0  Rx Drugs 0  Alcohol 0  Illegal Drugs 0  Rx Drugs 0  Age between 16-45 years  1  History of Preadolescent Sexual Abuse -  Psychological Disease 2  ADD Negative  OCD -  Bipolar Negative  Depression 1  Opioid Risk Tool Scoring 4  Opioid Risk Interpretation Moderate Risk    ORT Scoring interpretation table:  Score <3 = Low Risk for SUD  Score between 4-7 = Moderate Risk for SUD  Score >8 = High Risk for Opioid Abuse   Risk of substance use disorder (SUD): Moderate  Risk Mitigation Strategies:  Patient Counseling: Covered Patient-Prescriber Agreement (PPA): Present and active  Notification to other healthcare providers: Done  Pharmacologic Plan: No change in therapy, at this time.  ROS  Constitutional: Denies any fever or chills Gastrointestinal: No reported hemesis, hematochezia, vomiting, or acute GI distress Musculoskeletal: Denies any acute onset joint swelling, redness, loss of ROM, or weakness Neurological: No reported episodes of acute onset apraxia, aphasia, dysarthria, agnosia, amnesia, paralysis, loss of coordination, or loss of consciousness  Medication Review  HYDROcodone-acetaminophen, Melatonin, Vitamin D3, amitriptyline, aspirin EC, cyanocobalamin, cyclobenzaprine, dicyclomine, estradiol, fluticasone, gemfibrozil, magnesium gluconate, metroNIDAZOLE, montelukast, omeprazole, pregabalin, propranolol, propranolol ER, salmeterol, senna, umeclidinium bromide, and zolpidem  History Review  Allergy: Ms. Depaoli is allergic to prednisone. Drug: Ms. Lasky  reports no history of drug use. Alcohol:  reports no history of alcohol use. Tobacco:  reports that she has  never smoked. She has never used smokeless tobacco. Social: Ms. Coggins  reports that she has never smoked. She has never used smokeless tobacco. She reports that she does not drink alcohol or use drugs. Medical:  has a past medical history of Abnormal heart rhythm, Abnormal mammogram (02/25/2014), Anemia, Anxiety, Asthma, Awareness of heartbeats (06/29/2015), Breath shortness (06/29/2015), CAD (coronary artery disease), Cancer (HCC), Cervical radicular pain (Location of Secondary source of pain) (Bilateral) (R>L) (C8 Dermatome) (06/29/2015), Chronic pain, COPD (chronic obstructive pulmonary disease) ([place]), Degenerative disk disease, Depression, Dysrhythmia, Endometriosis, Family history of chronic pain (06/29/2015), Fibromyalgia, Fibromyalgia, GERD (gastroesophageal reflux disease), Heart murmur, Hiatal hernia, History of attempted suicide (06/29/2015), History of hiatal hernia (06/29/2015), History of psychiatric disorder (06/29/2015), History of suicidal ideation (06/29/2015), Hyperlipidemia, Insomnia, Insomnia, and Irritable bowel syndrome. Surgical: Ms. Warn  has a past surgical history that includes Abdominal hysterectomy (1986); Tonsillectomy (1957); Skin lesion excision; Oophorectomy (1986); Breast surgery; Breast biopsy (Right, 2016); ORIF ankle fracture (Left, 03/14/2018); ORIF ankle fracture (Left, 07/26/2018); and Ankle surgery. Family: family history includes Breast cancer in her maternal aunt; Heart disease in her father; Hypertension in her father and mother. Problem List: Ms. Belko has Long term current use of opiate analgesic; Myofascial pain syndrome; Cervical facet syndrome (Bilateral) (R>L); Chronic neck pain (Primary Source of Pain) (Bilateral) (R>L); Chronic pain syndrome; Cervical spondylosis; Fibromyalgia; Cervical herniated disc (C5-6 and C6-7); Cervical foraminal stenosis (Bilateral) (C5-6); Chronic cervical radicular pain (Bilateral) (R>L) (C8 Dermatome); Chronic low back pain  [facility] source of pain) (Bilateral) (R>L); Lumbar spondylosis; Chronic constipation; Chronic shoulder pain (Bilateral); Cervical facet arthropathy; and Lumbar facet arthropathy on their pertinent problem list.  Lab Review  Kidney Function Lab Results  Component Value Date   BUN 19 03/07/2018   CREATININE 0.91 03/07/2018   GFRAA >60 03/07/2018   GFRNONAA >60 03/07/2018  Liver Function Lab Results  Component Value Date   AST 32 03/07/2018   ALT 22 03/07/2018   ALBUMIN 3.9 03/07/2018  Note: Above Lab results reviewed.  Imaging Review  US Venous Img Lower Unilateral Left CLINICAL DATA:  63 year old female with left lower extremity pain and swelling for the past 3 weeks.  EXAM: LEFT LOWER EXTREMITY VENOUS DOPPLER ULTRASOUND  TECHNIQUE: Gray-scale sonography with graded compression, as well as color Doppler and duplex ultrasound were performed to evaluate the lower extremity deep venous systems from the level of the common femoral vein and including the common femoral, femoral, profunda femoral, popliteal and calf veins including the posterior tibial, peroneal and gastrocnemius veins when visible. The superficial great saphenous vein was also interrogated. Spectral Doppler was utilized to evaluate flow at rest and with distal augmentation maneuvers in the common femoral, femoral and popliteal veins.  COMPARISON:  None.  FINDINGS: Contralateral Common Femoral Vein: Respiratory phasicity is normal and symmetric with  the symptomatic side. No evidence of thrombus. Normal compressibility.  Common Femoral Vein: No evidence of thrombus. Normal compressibility, respiratory phasicity and response to augmentation.  Saphenofemoral Junction: No evidence of thrombus. Normal compressibility and flow on color Doppler imaging.  Profunda Femoral Vein: No evidence of thrombus. Normal compressibility and flow on color Doppler imaging.  Femoral Vein: No evidence of thrombus. Normal  compressibility, respiratory phasicity and response to augmentation.  Popliteal Vein: No evidence of thrombus. Normal compressibility, respiratory phasicity and response to augmentation.  Calf Veins: No evidence of thrombus. Normal compressibility and flow on color Doppler imaging.  Superficial Great Saphenous Vein: No evidence of thrombus. Normal compressibility.  Venous Reflux:  None.  Other Findings:  None.  IMPRESSION: No evidence of deep venous thrombosis.  Electronically Signed   By: Jacqulynn Cadet M.D.   On: 06/20/2018 14:08 Note: Above imaging results reviewed.        Physical Exam  General appearance: Well nourished, well developed, and well hydrated. In no apparent acute distress Mental status: Alert, oriented x 3 (person, place, & time)       Respiratory: No evidence of acute respiratory distress Eyes: PERLA Vitals: BP 134/62   Pulse 68   Temp 97.9 F (36.6 C)   Resp 16   Ht '5\' 2"'  (1.575 m)   Wt 155 lb (70.3 kg)   SpO2 99%   BMI 28.35 kg/m  BMI: Estimated body mass index is 28.35 kg/m as calculated from the following:   Height as of this encounter: '5\' 2"'  (1.575 m).   Weight as of this encounter: 155 lb (70.3 kg). Ideal: Ideal body weight: 50.1 kg (110 lb 7.2 oz) Adjusted ideal body weight: 58.2 kg (128 lb 4.3 oz) Lumbar Spine Area Exam  Skin & Axial Inspection: No masses, redness, or swelling Alignment: Symmetrical Functional ROM: Unrestricted ROM       Stability: No instability detected Muscle Tone/Strength: Functionally intact. No obvious neuro-muscular anomalies detected. Sensory (Neurological): Unimpaired Palpation: Tender        Lower Extremity Exam    Side: Right lower extremity  Side: Left lower extremity  Stability: No instability observed          Stability: No instability observed          Skin & Extremity Inspection: high knee hard boot worn  Skin & Extremity Inspection: Skin color, temperature, and hair growth are WNL. No peripheral  edema or cyanosis. No masses, redness, swelling, asymmetry, or associated skin lesions. No contractures.  Functional ROM: Adequate ROM                  Functional ROM: Unrestricted ROM                  Muscle Tone/Strength: Functionally intact. No obvious neuro-muscular anomalies detected.  Muscle Tone/Strength: Functionally intact. No obvious neuro-muscular anomalies detected.  Sensory (Neurological): Unimpaired        Sensory (Neurological): Unimpaired            Palpation: No palpable anomalies  Palpation: No palpable anomalies   Assessment   Status Diagnosis  Persistent Persistent Controlled 1. Lumbar spondylosis   2. Cervical spondylosis   3. Chronic pain syndrome   4. Vitamin D insufficiency   5. Fibromyalgia   6. Vitamin B12 deficiency      Updated Problems: No problems updated.  Plan of Care  Medications: I have discontinued Andria L. Courtright's docusate sodium. I have also changed her HYDROcodone-acetaminophen and HYDROcodone-acetaminophen. Additionally, I  am having her maintain her omeprazole, amitriptyline, montelukast, gemfibrozil, INCRUSE ELLIPTA, dicyclomine, estradiol, salmeterol, fluticasone, propranolol ER, zolpidem, propranolol, metroNIDAZOLE, magnesium gluconate, senna, aspirin EC, Vitamin D3, pregabalin, cyanocobalamin, HYDROcodone-acetaminophen, pregabalin, Melatonin, and cyclobenzaprine.  Administered today: Yariah L. Gruver had no medications administered during this visit.  Orders:  No orders of the defined types were placed in this encounter.  Interventional options: Planned follow-up:    Return in about 3 months (around 01/21/2019) for MedMgmt.   Considering:  Diagnostic bilateral cervical facetblock  Possible bilateral cervical facet radiofrequencyablation  Diagnostic right-sided cervical epiduralsteroid injection  Diagnostic bilateral intra-articular shoulder jointinjection  Diagnostic bilateral suprascapular nerveblock  Possible bilateral  suprascapular nerve radiofrequencyablation  Diagnostic bilateral lumbar facetblock  Possible bilateral lumbar facet radiofrequencyablation  Diagnostic left sided carpal tunnel injection   Palliative PRN treatment(s):  Palliativebilateral cervical facetblock  Palliativeright-sided cervical epiduralsteroid injection  Palliativebilateral intra-articular shoulderjoint injection Palliativebilateral suprascapular nerveblock  Palliativebilateral lumbar facet block  Palliativeleft sided L4-5 lumbar epidural steroid injection  Palliativeleft sided carpal tunnelinjection     Note by: Dionisio David, NP Date: 10/23/2018; Time: 2:50 PM

## 2018-11-23 ENCOUNTER — Other Ambulatory Visit: Payer: Self-pay | Admitting: Nurse Practitioner

## 2018-11-23 DIAGNOSIS — G894 Chronic pain syndrome: Secondary | ICD-10-CM

## 2019-01-10 ENCOUNTER — Other Ambulatory Visit: Payer: Self-pay | Admitting: Nurse Practitioner

## 2019-01-16 ENCOUNTER — Ambulatory Visit: Payer: Medicare Other | Attending: Nurse Practitioner | Admitting: Nurse Practitioner

## 2019-01-16 ENCOUNTER — Other Ambulatory Visit: Payer: Self-pay

## 2019-01-16 ENCOUNTER — Encounter: Payer: Self-pay | Admitting: *Deleted

## 2019-01-16 DIAGNOSIS — E559 Vitamin D deficiency, unspecified: Secondary | ICD-10-CM

## 2019-01-16 DIAGNOSIS — M797 Fibromyalgia: Secondary | ICD-10-CM

## 2019-01-16 DIAGNOSIS — M47816 Spondylosis without myelopathy or radiculopathy, lumbar region: Secondary | ICD-10-CM

## 2019-01-16 DIAGNOSIS — E538 Deficiency of other specified B group vitamins: Secondary | ICD-10-CM

## 2019-01-16 DIAGNOSIS — M47812 Spondylosis without myelopathy or radiculopathy, cervical region: Secondary | ICD-10-CM

## 2019-01-16 DIAGNOSIS — G894 Chronic pain syndrome: Secondary | ICD-10-CM

## 2019-01-16 DIAGNOSIS — E612 Magnesium deficiency: Secondary | ICD-10-CM

## 2019-01-16 MED ORDER — HYDROCODONE-ACETAMINOPHEN 5-325 MG PO TABS: 1.0000 | ORAL_TABLET | ORAL | 0 refills | Status: DC | PRN

## 2019-01-16 MED ORDER — PREGABALIN 100 MG PO CAPS: 100.0000 mg | ORAL_CAPSULE | Freq: Every day | ORAL | 2 refills | Status: DC

## 2019-01-16 MED ORDER — MELATONIN 10 MG PO CAPS: 20.0000 mg | ORAL_CAPSULE | Freq: Every evening | ORAL | 2 refills | Status: DC | PRN

## 2019-01-16 MED ORDER — VITAMIN D3 50 MCG (2000 UT) PO CAPS: 2000.0000 [IU] | ORAL_CAPSULE | Freq: Every day | ORAL | 2 refills | Status: DC

## 2019-01-16 MED ORDER — MAGNESIUM OXIDE -MG SUPPLEMENT 500 MG PO CAPS: 1.0000 | ORAL_CAPSULE | Freq: Every day | ORAL | 0 refills | Status: DC

## 2019-01-16 MED ORDER — CYANOCOBALAMIN 2000 MCG PO TABS: 2000.0000 ug | ORAL_TABLET | Freq: Every day | ORAL | 2 refills | Status: DC

## 2019-01-16 MED ORDER — CYCLOBENZAPRINE HCL 5 MG PO TABS: 5.0000 mg | ORAL_TABLET | Freq: Four times a day (QID) | ORAL | 2 refills | Status: DC

## 2019-01-16 NOTE — Progress Notes (Signed)
Pain Management Encounter Note - Virtual Visit via Telephone Telehealth (real-time audio visits between healthcare provider and patient).  Patient's Phone No. & Preferred Pharmacy:  [phone] (home); [phone] (mobile); (Preferred) [place], [address] [place] [address] Phone: [phone] Fax: [phone]   Pre-screening note:  Our staff contacted Zoe Sanchez and offered her an "in person", "face-to-face" appointment versus a telephone encounter. She indicated preferring the telephone encounter, at this time.  Reason for Virtual Visit: COVID-19*  Social distancing based on CDC and AMA recommendations.   I contacted Zoe Sanchez on 01/16/2019 at 8:32 AM by telephone and clearly identified myself as Dionisio David, NP. I verified that I was speaking with the correct person using two identifiers (Name and date of birth: [birth year]).  Advanced Informed Consent I sought verbal advanced consent from Zoe Sanchez for telemedicine interactions and virtual visit. I informed Zoe Sanchez of the security and privacy concerns, risks, and limitations associated with performing an evaluation and management service by telephone. I also informed Zoe Sanchez of the availability of "in person" appointments and I informed her of the possibility of a patient responsible charge related to this service. Zoe Sanchez expressed understanding and agreed to proceed.   Historic Elements   Zoe Sanchez is a 63 y.o. year old, female patient evaluated today after her last encounter by our practice on 01/10/2019. Zoe Sanchez  has a past medical history of Abnormal heart rhythm, Abnormal mammogram (02/25/2014), Anemia, Anxiety, Asthma, Awareness of heartbeats (06/29/2015), Breath shortness (06/29/2015), CAD (coronary artery disease), Cancer ([place]), Cervical radicular pain (Location of Secondary source of pain) (Bilateral) (R>L) (C8 Dermatome)  (06/29/2015), Chronic pain, COPD (chronic obstructive pulmonary disease) ([place]), Degenerative disk disease, Depression, Dysrhythmia, Endometriosis, Family history of chronic pain (06/29/2015), Fibromyalgia, Fibromyalgia, GERD (gastroesophageal reflux disease), Heart murmur, Hiatal hernia, History of attempted suicide (06/29/2015), History of hiatal hernia (06/29/2015), History of psychiatric disorder (06/29/2015), History of suicidal ideation (06/29/2015), Hyperlipidemia, Insomnia, Insomnia, and Irritable bowel syndrome. She also  has a past surgical history that includes Abdominal hysterectomy (1986); Tonsillectomy (1957); Skin lesion excision; Oophorectomy (1986); Breast surgery; Breast biopsy (Right, 2016); ORIF ankle fracture (Left, 03/14/2018); ORIF ankle fracture (Left, 07/26/2018); and Ankle surgery. Zoe Sanchez has a current medication list which includes the following prescription(s): albuterol, amitriptyline, aspirin ec, vitamin d3, cyanocobalamin, cyclobenzaprine, dicyclomine, estradiol, fluticasone, gemfibrozil, hydrocodone-acetaminophen, hydrocodone-acetaminophen, hydrocodone-acetaminophen, incruse ellipta, magnesium gluconate, magnesium oxide, melatonin, metronidazole, montelukast, omeprazole, pregabalin, pregabalin, propranolol, propranolol er, salmeterol, senna, and zolpidem. She  reports that she has never smoked. She has never used smokeless tobacco. She reports that she does not drink alcohol or use drugs. Zoe Sanchez is allergic to prednisone.   HPI  I last saw her on 01/10/2019. She is being evaluated for medication management. She has 2/10 with neck and left arm pain . She has numbness in her left arm and fingers. She has some weakness and decreased ROM. She does feels like the pain over all the pain is getting worse. She admits that activity is what makes her pain worse.   Pharmacotherapy Assessment  Analgesic:Hydrocodone/APAP 5/325 one every4 hours (6 per day) (30 mg/day) MME/day:30  mg/day.  Monitoring: Pharmacotherapy: No side-effects or adverse reactions reported.  PMP: PDMP reviewed during this encounter.       Compliance: No problems identified. Plan: Refer to "POC".  Review of recent tests  US Venous Img Lower Unilateral Left CLINICAL DATA:  63 year old female with left lower extremity  pain and swelling for the past 3 weeks.  EXAM: LEFT LOWER EXTREMITY VENOUS DOPPLER ULTRASOUND  TECHNIQUE: Gray-scale sonography with graded compression, as well as color Doppler and duplex ultrasound were performed to evaluate the lower extremity deep venous systems from the level of the common femoral vein and including the common femoral, femoral, profunda femoral, popliteal and calf veins including the posterior tibial, peroneal and gastrocnemius veins when visible. The superficial great saphenous vein was also interrogated. Spectral Doppler was utilized to evaluate flow at rest and with distal augmentation maneuvers in the common femoral, femoral and popliteal veins.  COMPARISON:  None.  FINDINGS: Contralateral Common Femoral Vein: Respiratory phasicity is normal and symmetric with the symptomatic side. No evidence of thrombus. Normal compressibility.  Common Femoral Vein: No evidence of thrombus. Normal compressibility, respiratory phasicity and response to augmentation.  Saphenofemoral Junction: No evidence of thrombus. Normal compressibility and flow on color Doppler imaging.  Profunda Femoral Vein: No evidence of thrombus. Normal compressibility and flow on color Doppler imaging.  Femoral Vein: No evidence of thrombus. Normal compressibility, respiratory phasicity and response to augmentation.  Popliteal Vein: No evidence of thrombus. Normal compressibility, respiratory phasicity and response to augmentation.  Calf Veins: No evidence of thrombus. Normal compressibility and flow on color Doppler imaging.  Superficial Great Saphenous Vein: No  evidence of thrombus. Normal compressibility.  Venous Reflux:  None.  Other Findings:  None.  IMPRESSION: No evidence of deep venous thrombosis.  Electronically Signed   By: Jacqulynn Cadet M.D.   On: 06/20/2018 14:08   Clinical Support on 07/23/2018  Component Date Value Ref Range Status  . Summary 07/23/2018 FINAL   Final   Comment: ==================================================================== TOXASSURE SELECT 13 (MW) ==================================================================== Test                             Result       Flag       Units Drug Present and Declared for Prescription Verification   Hydrocodone                    3119         EXPECTED   ng/mg creat   Hydromorphone                  119          EXPECTED   ng/mg creat   Dihydrocodeine                 725          EXPECTED   ng/mg creat   Norhydrocodone                 3244         EXPECTED   ng/mg creat    Sources of hydrocodone include scheduled prescription    medications. Hydromorphone, dihydrocodeine and norhydrocodone are    expected metabolites of hydrocodone. Hydromorphone and    dihydrocodeine are also available as scheduled prescription    medications. ==================================================================== Test                      Result    Flag   Units      Ref Range   Creatinine              63  mg/dL      >=20 ==================================================================== Declared Medications:  The flagging and interpretation on this report are based on the  following declared medications.  Unexpected results may arise from  inaccuracies in the declared medications.  **Note: The testing scope of this panel includes these medications:  Hydrocodone (Hydrocodone-Acetaminophen)  **Note: The testing scope of this panel does not include following  reported medications:  Acetaminophen (Hydrocodone-Acetaminophen)  Amitriptyline  (Elavil)  Cyanocobalamin  Cyclobenzaprine  Dicyclomine (Bentyl)  Estradiol (Estrace)  Fluticasone (Flonase)  Gemfibrozil (Lopid)  Magnesium (Magonate)  Melatonin  Metronidazole (Flagyl)  Montelukast (Singulair)  Omeprazole (Prilosec)  Pregabalin (Lyrica)  Propranolol (Inderal)  Salmeterol (Serevent)  Umeclidinium (Incruse Ellipta)  Vitamin D3  Zolpidem (Ambien) ====================================================                          ================ For clinical consultation, please call [phone]. ====================================================================    Assessment  The primary encounter diagnosis was Cervical spondylosis. Diagnoses of Vitamin D insufficiency, Vitamin B12 deficiency, Chronic pain syndrome, Fibromyalgia, Magnesium deficiency, and Lumbar spondylosis were also pertinent to this visit.  Plan of Care  I have changed Zoe Sanchez's HYDROcodone-acetaminophen and HYDROcodone-acetaminophen. I am also having her maintain her omeprazole, amitriptyline, montelukast, gemfibrozil, Incruse Ellipta, dicyclomine, estradiol, salmeterol, fluticasone, propranolol ER, zolpidem, propranolol, metroNIDAZOLE, magnesium gluconate, senna, aspirin EC, pregabalin, albuterol, Vitamin D3, cyanocobalamin, cyclobenzaprine, HYDROcodone-acetaminophen, Melatonin, pregabalin, and Magnesium Oxide.  Pharmacotherapy (Medications Ordered): Meds ordered this encounter  Medications  . Cholecalciferol (VITAMIN D3) 50 MCG (2000 UT) capsule    Sig: Take 1 capsule (2,000 Units total) by mouth daily.    Dispense:  30 capsule    Refill:  2    Do not place this medication, or any other prescription from our practice, on "Automatic Refill".    Order Specific Question:   Supervising Provider    Answer:   Milinda Pointer [phone]  . cyanocobalamin ([facility] VITAMIN B12) 2000 MCG tablet    Sig: Take 1 tablet (2,000 mcg total) by mouth daily.    Dispense:  30 tablet    Refill:  2     Do not place this medication, or any other prescription from our practice, on "Automatic Refill".    Order Specific Question:   Supervising Provider    Answer:   Milinda Pointer [phone]  . cyclobenzaprine (FLEXERIL) 5 MG tablet    Sig: Take 1 tablet (5 mg total) by mouth 4 (four) times daily.    Dispense:  120 tablet    Refill:  2    Do not place this medication, or any other prescription from our practice, on "Automatic Refill". Patient may have prescription filled one day early if pharmacy is closed on scheduled refill date.    Order Specific Question:   Supervising Provider    Answer:   Milinda Pointer [phone]  . HYDROcodone-acetaminophen (NORCO/VICODIN) 5-325 MG tablet    Sig: Take 1 tablet by mouth every 4 (four) hours as needed for up to 30 days for severe pain.    Dispense:  180 tablet    Refill:  0    Do not add this medication to the electronic "Automatic Refill" notification system. Patient may have prescription filled one day early if pharmacy is closed on scheduled refill date.    Order Specific Question:   Supervising Provider    Answer:   Milinda Pointer [phone]  . Melatonin 10 MG CAPS    Sig: Take 20 mg by mouth at  bedtime as needed.    Dispense:  60 capsule    Refill:  2    Do not place this medication, or any other prescription from our practice, on "Automatic Refill". Patient may have prescription filled one day early if pharmacy is closed on scheduled refill date.    Order Specific Question:   Supervising Provider    Answer:   Milinda Pointer [phone]  . pregabalin (LYRICA) 100 MG capsule    Sig: Take 1 capsule (100 mg total) by mouth at bedtime.    Dispense:  30 capsule    Refill:  2    Do not place this medication on "Automatic Refill". Patient may have prescription filled one day early if pharmacy is closed on scheduled refill date.    Order Specific Question:   Supervising Provider    Answer:   Milinda Pointer [phone]  .  HYDROcodone-acetaminophen (NORCO/VICODIN) 5-325 MG tablet    Sig: Take 1 tablet by mouth every 4 (four) hours as needed for up to 30 days for severe pain.    Dispense:  180 tablet    Refill:  0    Do not add this medication to the electronic "Automatic Refill" notification system. Patient may have prescription filled one day early if pharmacy is closed on scheduled refill date.    Order Specific Question:   Supervising Provider    Answer:   Milinda Pointer [phone]  . HYDROcodone-acetaminophen (NORCO/VICODIN) 5-325 MG tablet    Sig: Take 1 tablet by mouth every 4 (four) hours as needed for up to 30 days for severe pain.    Dispense:  180 tablet    Refill:  0    Do not add this medication to the electronic "Automatic Refill" notification system. Patient may have prescription filled one day early if pharmacy is closed on scheduled refill date.    Order Specific Question:   Supervising Provider    Answer:   Milinda Pointer [phone]  . Magnesium Oxide 500 MG CAPS    Sig: Take 1 capsule (500 mg total) by mouth daily.    Dispense:  90 capsule    Refill:  0    Do not place this medication, or any other prescription from our practice, on "Automatic Refill". Patient may have prescription filled one day early if pharmacy is closed on scheduled refill date.    Order Specific Question:   Supervising Provider    Answer:   Milinda Pointer [phone]   Orders:  No orders of the defined types were placed in this encounter.  Follow-up plan:   Return in about 3 months (around 04/18/2019) for MedMgmt.   I discussed the assessment and treatment plan with the patient. The patient was provided an opportunity to ask questions and all were answered. The patient agreed with the plan and demonstrated an understanding of the instructions.  Patient advised to call back or seek an in-person evaluation if the symptoms or condition worsens.  Total duration of non-face-to-face encounter: 12 minutes.  Note by:  Dionisio David, NP Date: 01/16/2019; Time: 8:51 AM  Disclaimer:  * Given the special circumstances of the COVID-19 pandemic, the federal government has announced that the Office for Civil Rights (OCR) will exercise its enforcement discretion and will not impose penalties on physicians using telehealth in the event of noncompliance with regulatory requirements under the [place] (HIPAA) in connection with the good faith provision of telehealth during the EGBTD-17 national public health emergency. ([place])

## 2019-01-16 NOTE — Patient Instructions (Signed)
____________________________________________________________________________________________  Medication Rules  Purpose: To inform patients, and their family members, of our rules and regulations.  Applies to: All patients receiving prescriptions (written or electronic).  Pharmacy of record: Pharmacy where electronic prescriptions will be sent. If written prescriptions are taken to a different pharmacy, please inform the nursing staff. The pharmacy listed in the electronic medical record should be the one where you would like electronic prescriptions to be sent.  Electronic prescriptions: In compliance with the [place] Strengthen Opioid Misuse Prevention (STOP) Act of 2017 (Session Law 2017-74/H243), effective September 12, 2018, all controlled substances must be electronically prescribed. Calling prescriptions to the pharmacy will cease to exist.  Prescription refills: Only during scheduled appointments. Applies to all prescriptions.  NOTE: The following applies primarily to controlled substances (Opioid* Pain Medications).   Patient's responsibilities: 1. Pain Pills: Bring all pain pills to every appointment (except for procedure appointments). 2. Pill Bottles: Bring pills in original pharmacy bottle. Always bring the newest bottle. Bring bottle, even if empty. 3. Medication refills: You are responsible for knowing and keeping track of what medications you take and those you need refilled. The day before your appointment: write a list of all prescriptions that need to be refilled. The day of the appointment: give the list to the admitting nurse. Prescriptions will be written only during appointments. No prescriptions will be written on procedure days. If you forget a medication: it will not be "Called in", "Faxed", or "electronically sent". You will need to get another appointment to get these prescribed. No early refills. Do not call asking to have your prescription filled  early. 4. Prescription Accuracy: You are responsible for carefully inspecting your prescriptions before leaving our office. Have the discharge nurse carefully go over each prescription with you, before taking them home. Make sure that your name is accurately spelled, that your address is correct. Check the name and dose of your medication to make sure it is accurate. Check the number of pills, and the written instructions to make sure they are clear and accurate. Make sure that you are given enough medication to last until your next medication refill appointment. 5. Taking Medication: Take medication as prescribed. When it comes to controlled substances, taking less pills or less frequently than prescribed is permitted and encouraged. Never take more pills than instructed. Never take medication more frequently than prescribed.  6. Inform other Doctors: Always inform, all of your healthcare providers, of all the medications you take. 7. Pain Medication from other Providers: You are not allowed to accept any additional pain medication from any other Doctor or Healthcare provider. There are two exceptions to this rule. (see below) In the event that you require additional pain medication, you are responsible for notifying us, as stated below. 8. Medication Agreement: You are responsible for carefully reading and following our Medication Agreement. This must be signed before receiving any prescriptions from our practice. Safely store a copy of your signed Agreement. Violations to the Agreement will result in no further prescriptions. (Additional copies of our Medication Agreement are available upon request.) 9. Laws, Rules, & Regulations: All patients are expected to follow all Federal and State Laws, Statutes, Rules, & Regulations. Ignorance of the Laws does not constitute a valid excuse. The use of any illegal substances is prohibited. 10. Adopted CDC guidelines & recommendations: Target dosing levels will be  at or below 60 MME/day. Use of benzodiazepines** is not recommended.  Exceptions: There are only two exceptions to the rule of not   receiving pain medications from other Healthcare Providers. 1. Exception #1 (Emergencies): In the event of an emergency (i.e.: accident requiring emergency care), you are allowed to receive additional pain medication. However, you are responsible for: As soon as you are able, call our office [phone], at any time of the day or night, and leave a message stating your name, the date and nature of the emergency, and the name and dose of the medication prescribed. In the event that your call is answered by a member of our staff, make sure to document and save the date, time, and the name of the person that took your information.  2. Exception #2 (Planned Surgery): In the event that you are scheduled by another doctor or dentist to have any type of surgery or procedure, you are allowed (for a period no longer than 30 days), to receive additional pain medication, for the acute post-op pain. However, in this case, you are responsible for picking up a copy of our "Post-op Pain Management for Surgeons" handout, and giving it to your surgeon or dentist. This document is available at our office, and does not require an appointment to obtain it. Simply go to our office during business hours (Monday-Thursday from 8:00 AM to 4:00 PM) (Friday 8:00 AM to 12:00 Noon) or if you have a scheduled appointment with us, prior to your surgery, and ask for it by name. In addition, you will need to provide us with your name, name of your surgeon, type of surgery, and date of procedure or surgery.  *Opioid medications include: morphine, codeine, oxycodone, oxymorphone, hydrocodone, hydromorphone, meperidine, tramadol, tapentadol, buprenorphine, fentanyl, methadone. **Benzodiazepine medications include: diazepam (Valium), alprazolam (Xanax), clonazepam (Klonopine), lorazepam (Ativan), clorazepate  (Tranxene), chlordiazepoxide (Librium), estazolam (Prosom), oxazepam (Serax), temazepam (Restoril), triazolam (Halcion) (Last updated: 11/09/2017) ____________________________________________________________________________________________    

## 2019-01-23 DIAGNOSIS — S82852A Displaced trimalleolar fracture of left lower leg, initial encounter for closed fracture: Secondary | ICD-10-CM | POA: Insufficient documentation

## 2019-01-25 ENCOUNTER — Other Ambulatory Visit: Payer: Self-pay | Admitting: Nurse Practitioner

## 2019-01-25 DIAGNOSIS — G894 Chronic pain syndrome: Secondary | ICD-10-CM

## 2019-04-10 ENCOUNTER — Encounter: Payer: Self-pay | Admitting: Pain Medicine

## 2019-04-10 ENCOUNTER — Ambulatory Visit: Payer: Medicare Other | Admitting: Pain Medicine

## 2019-04-10 ENCOUNTER — Telehealth: Payer: Self-pay | Admitting: *Deleted

## 2019-04-10 NOTE — Telephone Encounter (Signed)
Last script 02-06-19. Next appointment 04-17-19. Attempted to notify patient. Spoke with her mother, she is not there. Will call again later.

## 2019-04-16 NOTE — Progress Notes (Signed)
Failed Attempt at Virtual Visit via Telephone   Patient's Phone No. & Preferred Pharmacy:  [phone] (home); [phone] (mobile); (Preferred) [phone] [email or web address]   I attempted to contact [name] on 04/17/2019 and on 04/18/19, several times via telephone. I was unable to complete the visit due to no one answering the "preferred" phone, the secondary number always ringing busy, and also her mother not answering either one of the alternative numbers to call. I was also unable to leave a message. I also send her text messages and an e-mail communication. All of the above went unanswered.  Analgesic: Hydrocodone/APAP 5/325, 1 tab PO q 4 hrs (30 mg/day of hydrocodone)(1,950 mg/day of acetaminophen) MME/day:30 mg/day.   Follow-up plan:   Return in about 14 weeks (around 07/24/2019) for (VV), E/M (MM).      Interventional management options:  Considering:   Diagnostic bilateral cervical facetblock  Possible bilateral cervical facet RFA  Diagnostic right CESI  Diagnostic bilateral IA shoulder jointinjection  Diagnostic bilateral suprascapular NB  Possible bilateral suprascapular nerve RFA  Possible bilateral lumbar facet RFA  Diagnostic left sided carpal tunnel injection    Palliative PRN treatment(s):   Diagnostic bilateral lumbar facetblock #3 (w/o Steroids)     Note by: Gaspar Cola, MD

## 2019-04-17 ENCOUNTER — Other Ambulatory Visit: Payer: Self-pay

## 2019-04-17 ENCOUNTER — Ambulatory Visit: Payer: Medicare Other | Attending: Pain Medicine | Admitting: Pain Medicine

## 2019-04-17 ENCOUNTER — Telehealth: Payer: Self-pay | Admitting: *Deleted

## 2019-04-17 DIAGNOSIS — M797 Fibromyalgia: Secondary | ICD-10-CM

## 2019-04-17 DIAGNOSIS — E538 Deficiency of other specified B group vitamins: Secondary | ICD-10-CM

## 2019-04-17 DIAGNOSIS — Z789 Other specified health status: Secondary | ICD-10-CM | POA: Insufficient documentation

## 2019-04-17 DIAGNOSIS — E612 Magnesium deficiency: Secondary | ICD-10-CM

## 2019-04-17 DIAGNOSIS — M5441 Lumbago with sciatica, right side: Secondary | ICD-10-CM

## 2019-04-17 DIAGNOSIS — M542 Cervicalgia: Secondary | ICD-10-CM

## 2019-04-17 DIAGNOSIS — Z79899 Other long term (current) drug therapy: Secondary | ICD-10-CM | POA: Insufficient documentation

## 2019-04-17 DIAGNOSIS — M899 Disorder of bone, unspecified: Secondary | ICD-10-CM | POA: Insufficient documentation

## 2019-04-17 DIAGNOSIS — M5442 Lumbago with sciatica, left side: Secondary | ICD-10-CM

## 2019-04-17 DIAGNOSIS — M79601 Pain in right arm: Secondary | ICD-10-CM

## 2019-04-17 DIAGNOSIS — M7918 Myalgia, other site: Secondary | ICD-10-CM

## 2019-04-17 DIAGNOSIS — M79602 Pain in left arm: Secondary | ICD-10-CM

## 2019-04-17 DIAGNOSIS — E559 Vitamin D deficiency, unspecified: Secondary | ICD-10-CM

## 2019-04-17 DIAGNOSIS — G8929 Other chronic pain: Secondary | ICD-10-CM | POA: Insufficient documentation

## 2019-04-17 DIAGNOSIS — M792 Neuralgia and neuritis, unspecified: Secondary | ICD-10-CM | POA: Insufficient documentation

## 2019-04-17 DIAGNOSIS — G4701 Insomnia due to medical condition: Secondary | ICD-10-CM

## 2019-04-17 DIAGNOSIS — G894 Chronic pain syndrome: Secondary | ICD-10-CM

## 2019-04-18 ENCOUNTER — Encounter: Payer: Self-pay | Admitting: Pain Medicine

## 2019-04-18 MED ORDER — PREGABALIN 100 MG PO CAPS: 100.0000 mg | ORAL_CAPSULE | Freq: Every day | ORAL | 0 refills | Status: DC

## 2019-04-18 MED ORDER — CYCLOBENZAPRINE HCL 5 MG PO TABS: 5.0000 mg | ORAL_TABLET | Freq: Four times a day (QID) | ORAL | 0 refills | Status: DC | PRN

## 2019-04-18 MED ORDER — HYDROCODONE-ACETAMINOPHEN 5-325 MG PO TABS: 1.0000 | ORAL_TABLET | ORAL | 0 refills | Status: DC | PRN

## 2019-04-18 MED ORDER — MELATONIN 10 MG PO CAPS: 20.0000 mg | ORAL_CAPSULE | Freq: Every evening | ORAL | 2 refills | Status: DC | PRN

## 2019-04-26 ENCOUNTER — Other Ambulatory Visit
Admission: RE | Admit: 2019-04-26 | Discharge: 2019-04-26 | Disposition: A | Discharge status: Home / Self Care | Payer: Medicare Other | Source: Ambulatory Visit | Attending: Pain Medicine | Admitting: Pain Medicine

## 2019-04-26 DIAGNOSIS — E612 Magnesium deficiency: Secondary | ICD-10-CM | POA: Insufficient documentation

## 2019-04-26 DIAGNOSIS — M899 Disorder of bone, unspecified: Secondary | ICD-10-CM | POA: Insufficient documentation

## 2019-04-26 DIAGNOSIS — E538 Deficiency of other specified B group vitamins: Secondary | ICD-10-CM | POA: Insufficient documentation

## 2019-04-26 DIAGNOSIS — G894 Chronic pain syndrome: Secondary | ICD-10-CM | POA: Diagnosis present

## 2019-04-26 DIAGNOSIS — Z79899 Other long term (current) drug therapy: Secondary | ICD-10-CM | POA: Insufficient documentation

## 2019-04-26 DIAGNOSIS — Z789 Other specified health status: Secondary | ICD-10-CM | POA: Diagnosis not present

## 2019-04-26 DIAGNOSIS — E559 Vitamin D deficiency, unspecified: Secondary | ICD-10-CM | POA: Diagnosis not present

## 2019-04-26 LAB — COMPREHENSIVE METABOLIC PANEL
ALT: 22 U/L (ref 0–44)
AST: 35 U/L (ref 15–41)
Albumin: 4.1 g/dL (ref 3.5–5.0)
Alkaline Phosphatase: 71 U/L (ref 38–126)
Anion gap: 10 (ref 5–15)
BUN: 21 mg/dL (ref 8–23)
CO2: 22 mmol/L (ref 22–32)
Calcium: 9.4 mg/dL (ref 8.9–10.3)
Chloride: 103 mmol/L (ref 98–111)
Creatinine, Ser: 0.84 mg/dL (ref 0.44–1.00)
GFR calc Af Amer: >60 mL/min (ref >60)
GFR calc non Af Amer: >60 mL/min (ref >60)
Glucose, Bld: 132 mg/dL — ABNORMAL HIGH (ref 70–99)
Potassium: 4.5 mmol/L (ref 3.5–5.1)
Sodium: 135 mmol/L (ref 135–145)
Total Bilirubin: 0.3 mg/dL (ref 0.3–1.2)
Total Protein: 7.7 g/dL (ref 6.5–8.1)

## 2019-04-26 LAB — C-REACTIVE PROTEIN: CRP: 2.2 mg/dL — ABNORMAL HIGH (ref <1.0)

## 2019-04-26 LAB — MAGNESIUM: Magnesium: 1.8 mg/dL (ref 1.7–2.4)

## 2019-04-26 LAB — URINE DRUG SCREEN, QUALITATIVE (ARMC ONLY)
Amphetamines, Ur Screen: NOT DETECTED
Barbiturates, Ur Screen: NOT DETECTED
Benzodiazepine, Ur Scrn: NOT DETECTED
Cannabinoid 50 Ng, Ur ~~LOC~~: NOT DETECTED
Cocaine Metabolite,Ur ~~LOC~~: NOT DETECTED
MDMA (Ecstasy)Ur Screen: NOT DETECTED
Methadone Scn, Ur: NOT DETECTED
Opiate, Ur Screen: POSITIVE — AB
Phencyclidine (PCP) Ur S: NOT DETECTED
Tricyclic, Ur Screen: POSITIVE — AB

## 2019-04-26 LAB — VITAMIN B12: Vitamin B-12: 708 pg/mL (ref 180–914)

## 2019-04-26 LAB — SEDIMENTATION RATE: Sed Rate: 39 mm/hr — ABNORMAL HIGH (ref 0–30)

## 2019-04-27 LAB — VITAMIN D 25 HYDROXY (VIT D DEFICIENCY, FRACTURES): Vit D, 25-Hydroxy: 40.8 ng/mL (ref 30.0–100.0)

## 2019-04-30 ENCOUNTER — Telehealth: Payer: Self-pay

## 2019-04-30 NOTE — Telephone Encounter (Signed)
Pt was called with no answer and no answering service was available.

## 2019-04-30 NOTE — Progress Notes (Signed)
Pain Management Virtual Encounter Note - Virtual Visit via Telephone Telehealth (real-time audio visits between healthcare provider and patient).   Patient's Phone No. & Preferred Pharmacy:  [phone] (home); [phone] (mobile); (Preferred) [phone] [email or web address]  [place] [place] [address] Phone: [phone] Fax: [phone]    Pre-screening note:  Our staff contacted Zoe Sanchez and offered her an "in person", "face-to-face" appointment versus a telephone encounter. She indicated preferring the telephone encounter, at this time.   Reason for Virtual Visit: COVID-19*  Social distancing based on CDC and AMA recommendations.   I contacted Zoe Sanchez on 05/01/2019 via telephone.      I clearly identified myself as Gaspar Cola, MD. I verified that I was speaking with the correct person using two identifiers (Name: Zoe Sanchez, and date of birth: 1955/09/23).  Advanced Informed Consent I sought verbal advanced consent from Zoe Sanchez for virtual visit interactions. I informed Zoe Sanchez of possible security and privacy concerns, risks, and limitations associated with providing "not-in-person" medical evaluation and management services. I also informed Zoe Sanchez of the availability of "in-person" appointments. Finally, I informed her that there would be a charge for the virtual visit and that she could be  personally, fully or partially, financially responsible for it. Zoe Sanchez expressed understanding and agreed to proceed.   Historic Elements   Zoe Sanchez is a 63 y.o. year old, female patient evaluated today after her last encounter by our practice on 04/30/2019. Zoe Sanchez  has a past medical history of Abnormal heart rhythm, Abnormal mammogram (02/25/2014), Anemia, Anxiety, Asthma, Awareness of heartbeats (06/29/2015), Breath shortness (06/29/2015), CAD (coronary artery  disease), Cancer ([place]), Cervical radicular pain (Location of Secondary source of pain) (Bilateral) (R>L) (C8 Dermatome) (06/29/2015), Chronic pain, COPD (chronic obstructive pulmonary disease) ([place]), Degenerative disk disease, Depression, Dysrhythmia, Endometriosis, Family history of chronic pain (06/29/2015), Fibromyalgia, Fibromyalgia, GERD (gastroesophageal reflux disease), Heart murmur, Hiatal hernia, History of attempted suicide (06/29/2015), History of hiatal hernia (06/29/2015), History of psychiatric disorder (06/29/2015), History of suicidal ideation (06/29/2015), Hyperlipidemia, Insomnia, Insomnia, and Irritable bowel syndrome. She also  has a past surgical history that includes Abdominal hysterectomy (1986); Tonsillectomy (1957); Skin lesion excision; Oophorectomy (1986); Breast surgery; Breast biopsy (Right, 2016); ORIF ankle fracture (Left, 03/14/2018); ORIF ankle fracture (Left, 07/26/2018); and Ankle surgery. Zoe Sanchez has a current medication list which includes the following prescription(s): albuterol, amitriptyline, vitamin d3, cyanocobalamin, cyclobenzaprine, dicyclomine, estradiol, fluticasone, gemfibrozil, hydrocodone-acetaminophen, hydrocodone-acetaminophen, hydrocodone-acetaminophen, incruse ellipta, magnesium gluconate, melatonin, metronidazole, montelukast, omeprazole, pregabalin, pregabalin, propranolol, propranolol er, salmeterol, senna, and zolpidem. She  reports that she has never smoked. She has never used smokeless tobacco. She reports that she does not drink alcohol or use drugs. Zoe Sanchez is allergic to prednisone.   HPI  Today, she is being contacted for medication management.  The patient indicates doing well with the current medication regimen. No adverse reactions or side effects reported to the medications.  Today I finally had a chance to review how the patient is taking the Lyrica, which was not very clear to me.  She is actually taking 125 mg at bedtime.  Zoe David,  Zoe Sanchez had attempted to increase this to 150 mg, but the patient indicated that she was unable to tolerate that.  Apparently, although she does well with 125 mg, 150 mg causes her to have side effects.  Because of this, we will go ahead and continue with the 125 mg  p.o. at bedtime in the form of one 100 mg p.o. and one 25 mg p.o.  Pharmacotherapy Assessment  Analgesic: Hydrocodone/APAP 5/325, 1 tab PO q 4 hrs (30 mg/day of hydrocodone)(1,950 mg/day of acetaminophen) MME/day:30 mg/day.   Monitoring: Pharmacotherapy: No side-effects or adverse reactions reported. [place] PMP: PDMP reviewed during this encounter.       Compliance: No problems identified. Effectiveness: Clinically acceptable. Plan: Refer to "POC".  UDS:  Summary  Date Value Ref Range Status  07/23/2018 FINAL  Final    Comment:    ==================================================================== TOXASSURE SELECT 13 (MW) ==================================================================== Test                             Result       Flag       Units Drug Present and Declared for Prescription Verification   Hydrocodone                    3119         EXPECTED   ng/mg creat   Hydromorphone                  119          EXPECTED   ng/mg creat   Dihydrocodeine                 725          EXPECTED   ng/mg creat   Norhydrocodone                 3244         EXPECTED   ng/mg creat    Sources of hydrocodone include scheduled prescription    medications. Hydromorphone, dihydrocodeine and norhydrocodone are    expected metabolites of hydrocodone. Hydromorphone and    dihydrocodeine are also available as scheduled prescription    medications. ==================================================================== Test                      Result    Flag   Units      Ref Range   Creatinine              63               mg/dL      >=20 ==================================================================== Declared Medications:  The  flagging and interpretation on this report are based on the  following declared medications.  Unexpected results may arise from  inaccuracies in the declared medications.  **Note: The testing scope of this panel includes these medications:  Hydrocodone (Hydrocodone-Acetaminophen)  **Note: The testing scope of this panel does not include following  reported medications:  Acetaminophen (Hydrocodone-Acetaminophen)  Amitriptyline (Elavil)  Cyanocobalamin  Cyclobenzaprine  Dicyclomine (Bentyl)  Estradiol (Estrace)  Fluticasone (Flonase)  Gemfibrozil (Lopid)  Magnesium (Magonate)  Melatonin  Metronidazole (Flagyl)  Montelukast (Singulair)  Omeprazole (Prilosec)  Pregabalin (Lyrica)  Propranolol (Inderal)  Salmeterol (Serevent)  Umeclidinium (Incruse Ellipta)  Vitamin D3  Zolpidem (Ambien) ==================================================================== For clinical consultation, please call [phone]. ====================================================================    Laboratory Chemistry Profile (12 mo)  Renal: 04/26/2019: BUN 21; Creatinine, Ser 0.84  Lab Results  Component Value Date   GFRAA >60 04/26/2019   GFRNONAA >60 04/26/2019   Hepatic: 04/26/2019: Albumin 4.1 Lab Results  Component Value Date   AST 35 04/26/2019   ALT 22 04/26/2019   Other: 04/26/2019: CRP 2.2; Sed Rate 39; Vit D, 25-Hydroxy 40.8; Vitamin  B-12 708 Note: Above Lab results reviewed.  Imaging  Last 90 days:  No results found.  Assessment  The primary encounter diagnosis was Chronic pain syndrome. Diagnoses of Chronic neck pain (Primary Area of Pain) (Bilateral) (R>L), Chronic upper extremity pain (Secondary area of Pain) (Bilateral) (R>L), Chronic low back pain (Third area of Pain) (Bilateral) (R>L), Fibromyalgia, Chronic musculoskeletal pain, Myofascial pain syndrome, Neurogenic pain, Insomnia secondary to chronic pain, Vitamin B12 deficiency, and Vitamin D insufficiency were also  pertinent to this visit.  Plan of Care  I have changed Zoe Sanchez's cyclobenzaprine, HYDROcodone-acetaminophen, pregabalin, and Melatonin. I am also having her start on pregabalin, HYDROcodone-acetaminophen, and HYDROcodone-acetaminophen. Additionally, I am having her maintain her omeprazole, amitriptyline, montelukast, gemfibrozil, Incruse Ellipta, dicyclomine, estradiol, salmeterol, fluticasone, propranolol ER, zolpidem, propranolol, metroNIDAZOLE, magnesium gluconate, senna, albuterol, cyanocobalamin, and Vitamin D3.  Pharmacotherapy (Medications Ordered): Meds ordered this encounter  Medications  . cyclobenzaprine (FLEXERIL) 5 MG tablet    Sig: Take 1 tablet (5 mg total) by mouth every 6 (six) hours as needed for muscle spasms.    Dispense:  120 tablet    Refill:  2    Fill one day early if pharmacy is closed on scheduled refill date. May substitute for generic if available.  Zoe Sanchez Kitchen HYDROcodone-acetaminophen (NORCO/VICODIN) 5-325 MG tablet    Sig: Take 1 tablet by mouth every 4 (four) hours as needed for severe pain. Must last 30 days    Dispense:  180 tablet    Refill:  0    Chronic Pain: STOP Act (Not applicable) Fill 1 day early if closed on refill date. Do not fill until: 05/01/2019. To last until: 05/31/2019. Avoid benzodiazepines within 8 hours of opioids  . pregabalin (LYRICA) 100 MG capsule    Sig: Take 1 capsule (100 mg total) by mouth at bedtime.    Dispense:  30 capsule    Refill:  2    Fill one day early if pharmacy is closed on scheduled refill date. May substitute for generic if available.  . Melatonin 10 MG CAPS    Sig: Take 20 mg by mouth at bedtime as needed.    Dispense:  60 capsule    Refill:  2    Fill one day early if pharmacy is closed on scheduled refill date. May substitute for generic if available.  . cyanocobalamin ([employer] VITAMIN B12) 2000 MCG tablet    Sig: Take 1 tablet (2,000 mcg total) by mouth daily.    Dispense:  30 tablet    Refill:  2    Fill one day  early if pharmacy is closed on scheduled refill date. May substitute for generic if available.  . Cholecalciferol (VITAMIN D3) 50 MCG (2000 UT) capsule    Sig: Take 1 capsule (2,000 Units total) by mouth daily.    Dispense:  30 capsule    Refill:  2    Fill one day early if pharmacy is closed on scheduled refill date. May substitute for generic if available.  . pregabalin (LYRICA) 25 MG capsule    Sig: Take 1 capsule (25 mg total) by mouth at bedtime.    Dispense:  30 capsule    Refill:  2    Fill one day early if pharmacy is closed on scheduled refill date. May substitute for generic if available.  Zoe Sanchez Kitchen HYDROcodone-acetaminophen (NORCO/VICODIN) 5-325 MG tablet    Sig: Take 1 tablet by mouth every 4 (four) hours as needed for severe pain. Must last 30 days  Dispense:  180 tablet    Refill:  0    Chronic Pain: STOP Act (Not applicable) Fill 1 day early if closed on refill date. Do not fill until: 05/31/2019. To last until: 06/30/2019. Avoid benzodiazepines within 8 hours of opioids  . HYDROcodone-acetaminophen (NORCO/VICODIN) 5-325 MG tablet    Sig: Take 1 tablet by mouth every 4 (four) hours as needed for severe pain. Must last 30 days    Dispense:  180 tablet    Refill:  0    Chronic Pain: STOP Act (Not applicable) Fill 1 day early if closed on refill date. Do not fill until: 06/30/2019. To last until: 07/30/2019. Avoid benzodiazepines within 8 hours of opioids   Orders:  No orders of the defined types were placed in this encounter.  Follow-up plan:   Return in about 3 months (around 07/29/2019) for (VV), E/M (MM).      Interventional management options:  Considering:   Diagnostic bilateral cervical facetblock  Possible bilateral cervical facet RFA  Diagnostic right CESI  Diagnostic bilateral IA shoulder jointinjection  Diagnostic bilateral suprascapular NB  Possible bilateral suprascapular nerve RFA  Possible bilateral lumbar facet RFA  Diagnostic left sided carpal tunnel  injection    Palliative PRN treatment(s):   Diagnostic bilateral lumbar facetblock #3 (w/o Steroids)     Recent Visits Date Type Provider Dept  04/17/19 Office Visit Milinda Pointer, MD [facility]  Showing recent visits within past 90 days and meeting all other requirements   Today's Visits Date Type Provider Dept  05/01/19 Office Visit Milinda Pointer, MD [facility]  Showing today's visits and meeting all other requirements   Future Appointments No visits were found meeting these conditions.  Showing future appointments within next 90 days and meeting all other requirements   I discussed the assessment and treatment plan with the patient. The patient was provided an opportunity to ask questions and all were answered. The patient agreed with the plan and demonstrated an understanding of the instructions.  Patient advised to call back or seek an in-person evaluation if the symptoms or condition worsens.  Total duration of non-face-to-face encounter: 15 minutes.  Note by: Gaspar Cola, MD Date: 05/01/2019; Time: 5:12 PM  Note: This dictation was prepared with Dragon dictation. Any transcriptional errors that may result from this process are unintentional.  Disclaimer:  * Given the special circumstances of the COVID-19 pandemic, the federal government has announced that the Office for Civil Rights (OCR) will exercise its enforcement discretion and will not impose penalties on physicians using telehealth in the event of noncompliance with regulatory requirements under the [place] (HIPAA) in connection with the good faith provision of telehealth during the BSJGG-83 national public health emergency. ([place])

## 2019-05-01 ENCOUNTER — Other Ambulatory Visit: Payer: Self-pay

## 2019-05-01 ENCOUNTER — Ambulatory Visit: Payer: Medicare Other | Attending: Pain Medicine | Admitting: Pain Medicine

## 2019-05-01 DIAGNOSIS — G4701 Insomnia due to medical condition: Secondary | ICD-10-CM

## 2019-05-01 DIAGNOSIS — M5441 Lumbago with sciatica, right side: Secondary | ICD-10-CM

## 2019-05-01 DIAGNOSIS — M79601 Pain in right arm: Secondary | ICD-10-CM | POA: Diagnosis not present

## 2019-05-01 DIAGNOSIS — G8929 Other chronic pain: Secondary | ICD-10-CM

## 2019-05-01 DIAGNOSIS — M7918 Myalgia, other site: Secondary | ICD-10-CM

## 2019-05-01 DIAGNOSIS — G894 Chronic pain syndrome: Secondary | ICD-10-CM | POA: Diagnosis not present

## 2019-05-01 DIAGNOSIS — E559 Vitamin D deficiency, unspecified: Secondary | ICD-10-CM

## 2019-05-01 DIAGNOSIS — E538 Deficiency of other specified B group vitamins: Secondary | ICD-10-CM

## 2019-05-01 DIAGNOSIS — M79602 Pain in left arm: Secondary | ICD-10-CM

## 2019-05-01 DIAGNOSIS — M792 Neuralgia and neuritis, unspecified: Secondary | ICD-10-CM

## 2019-05-01 DIAGNOSIS — M797 Fibromyalgia: Secondary | ICD-10-CM

## 2019-05-01 DIAGNOSIS — M542 Cervicalgia: Secondary | ICD-10-CM | POA: Diagnosis not present

## 2019-05-01 DIAGNOSIS — M5442 Lumbago with sciatica, left side: Secondary | ICD-10-CM | POA: Diagnosis not present

## 2019-05-01 MED ORDER — CYANOCOBALAMIN 2000 MCG PO TABS: 2000.0000 ug | ORAL_TABLET | Freq: Every day | ORAL | 2 refills | Status: DC

## 2019-05-01 MED ORDER — PREGABALIN 100 MG PO CAPS: 100.0000 mg | ORAL_CAPSULE | Freq: Every day | ORAL | 2 refills | Status: DC

## 2019-05-01 MED ORDER — PREGABALIN 25 MG PO CAPS: 25.0000 mg | ORAL_CAPSULE | Freq: Every day | ORAL | 2 refills | Status: DC

## 2019-05-01 MED ORDER — HYDROCODONE-ACETAMINOPHEN 5-325 MG PO TABS: 1.0000 | ORAL_TABLET | ORAL | 0 refills | Status: DC | PRN

## 2019-05-01 MED ORDER — MELATONIN 10 MG PO CAPS: 20.0000 mg | ORAL_CAPSULE | Freq: Every evening | ORAL | 2 refills | Status: DC | PRN

## 2019-05-01 MED ORDER — VITAMIN D3 50 MCG (2000 UT) PO CAPS: 2000.0000 [IU] | ORAL_CAPSULE | Freq: Every day | ORAL | 2 refills | Status: DC

## 2019-05-01 MED ORDER — CYCLOBENZAPRINE HCL 5 MG PO TABS: 5.0000 mg | ORAL_TABLET | Freq: Four times a day (QID) | ORAL | 2 refills | Status: DC | PRN

## 2019-05-14 ENCOUNTER — Telehealth: Payer: Self-pay | Admitting: Gastroenterology

## 2019-05-14 ENCOUNTER — Other Ambulatory Visit: Payer: Self-pay | Admitting: Family Medicine

## 2019-05-14 DIAGNOSIS — Z1231 Encounter for screening mammogram for malignant neoplasm of breast: Secondary | ICD-10-CM

## 2019-05-14 NOTE — Telephone Encounter (Signed)
Pt left vm to schedule a colonoscopy   °

## 2019-05-15 ENCOUNTER — Other Ambulatory Visit: Payer: Self-pay

## 2019-05-15 ENCOUNTER — Telehealth: Payer: Self-pay

## 2019-05-15 DIAGNOSIS — Z800 Family history of malignant neoplasm of digestive organs: Secondary | ICD-10-CM

## 2019-05-15 DIAGNOSIS — Z1211 Encounter for screening for malignant neoplasm of colon: Secondary | ICD-10-CM

## 2019-05-15 NOTE — Telephone Encounter (Signed)
Gastroenterology Pre-Procedure Review  Request Date: 06/05/19 Requesting Physician: Dr. Vicente Males  PATIENT REVIEW QUESTIONS: The patient responded to the following health history questions as indicated:    1. Are you having any GI issues? yes (Reflux, IBS) 2. Do you have a personal history of Polyps? no 3. Do you have a family history of Colon Cancer or Polyps? yes (cousin colon cancer) 4. Diabetes Mellitus? no 5. Joint replacements in the past 12 months?no 6. Major health problems in the past 3 months?yes (office visit with Dr. Clayborn Bigness for Tachycardia) Cardiac Clearance Sent to Dr. Clayborn Bigness (Manual fax) 7. Any artificial heart valves, MVP, or defibrillator?no    MEDICATIONS & ALLERGIES:    Patient reports the following regarding taking any anticoagulation/antiplatelet therapy:   Plavix, Coumadin, Eliquis, Xarelto, Lovenox, Pradaxa, Brilinta, or Effient? no Aspirin? no  Patient confirms/reports the following medications:  Current Outpatient Medications  Medication Sig Dispense Refill  . albuterol (PROAIR HFA) 108 (90 Base) MCG/ACT inhaler ProAir HFA 90 mcg/actuation aerosol inhaler    . amitriptyline (ELAVIL) 100 MG tablet Take 200 mg by mouth at bedtime.     . Cholecalciferol (VITAMIN D3) 50 MCG (2000 UT) capsule Take 1 capsule (2,000 Units total) by mouth daily. 30 capsule 2  . cyanocobalamin ([employer]S VITAMIN B12) 2000 MCG tablet Take 1 tablet (2,000 mcg total) by mouth daily. 30 tablet 2  . cyclobenzaprine (FLEXERIL) 5 MG tablet Take 1 tablet (5 mg total) by mouth every 6 (six) hours as needed for muscle spasms. 120 tablet 2  . dicyclomine (BENTYL) 20 MG tablet Take 20 mg by mouth daily as needed for spasms.     Marland Kitchen estradiol (ESTRACE) 1 MG tablet Take 1 mg by mouth daily.     . fluticasone (FLONASE) 50 MCG/ACT nasal spray Place 2 sprays into both nostrils daily.     Marland Kitchen gemfibrozil (LOPID) 600 MG tablet Take 600 mg by mouth 2 (two) times daily before a meal.     . HYDROcodone-acetaminophen  (NORCO/VICODIN) 5-325 MG tablet Take 1 tablet by mouth every 4 (four) hours as needed for severe pain. Must last 30 days 180 tablet 0  . [START ON 05/31/2019] HYDROcodone-acetaminophen (NORCO/VICODIN) 5-325 MG tablet Take 1 tablet by mouth every 4 (four) hours as needed for severe pain. Must last 30 days 180 tablet 0  . [START ON 06/30/2019] HYDROcodone-acetaminophen (NORCO/VICODIN) 5-325 MG tablet Take 1 tablet by mouth every 4 (four) hours as needed for severe pain. Must last 30 days 180 tablet 0  . INCRUSE ELLIPTA 62.5 MCG/INH AEPB Inhale 1 puff into the lungs daily.     . magnesium gluconate (MAGONATE) 500 MG tablet Take 500 mg by mouth daily.    . Melatonin 10 MG CAPS Take 20 mg by mouth at bedtime as needed. 60 capsule 2  . metroNIDAZOLE (FLAGYL) 500 MG tablet Take 500 mg by mouth 2 (two) times daily.    . montelukast (SINGULAIR) 10 MG tablet Take by mouth at bedtime.     Marland Kitchen omeprazole (PRILOSEC) 20 MG capsule Take 20 mg by mouth 2 (two) times daily before a meal.     . pregabalin (LYRICA) 100 MG capsule Take 1 capsule (100 mg total) by mouth at bedtime. 30 capsule 2  . pregabalin (LYRICA) 25 MG capsule Take 1 capsule (25 mg total) by mouth at bedtime. 30 capsule 2  . propranolol (INDERAL) 40 MG tablet TAKE 40 - 280MG  BY MOUTH DAILY AS NEEDED    . propranolol ER (INDERAL LA) 80 MG  24 hr capsule TAKE 2 CAPSULES BY MOUTH ONCE DAILY at bedtime    . salmeterol (SEREVENT DISKUS) 50 MCG/DOSE diskus inhaler Inhale 1 puff into the lungs daily.     Marland Kitchen senna (SENOKOT) 8.6 MG TABS tablet Take 2 tablets (17.2 mg total) by mouth 2 (two) times daily. 30 each 0  . zolpidem (AMBIEN) 10 MG tablet Take 10 mg by mouth at bedtime.      No current facility-administered medications for this visit.     Patient confirms/reports the following allergies:  Allergies  Allergen Reactions  . Prednisone Palpitations    No orders of the defined types were placed in this encounter.   AUTHORIZATION  INFORMATION Primary Insurance: 1D#: Group #:  Secondary Insurance: 1D#: Group #:  SCHEDULE INFORMATION: Date: 06/05/19 Time: Location:[facility]

## 2019-05-31 ENCOUNTER — Encounter
Admission: RE | Admit: 2019-05-31 | Discharge: 2019-05-31 | Disposition: A | Discharge status: Home / Self Care | Payer: Medicare Other | Source: Ambulatory Visit | Attending: Gastroenterology | Admitting: Gastroenterology

## 2019-05-31 ENCOUNTER — Other Ambulatory Visit: Payer: Self-pay

## 2019-05-31 DIAGNOSIS — Z20828 Contact with and (suspected) exposure to other viral communicable diseases: Secondary | ICD-10-CM | POA: Diagnosis not present

## 2019-05-31 DIAGNOSIS — Z01812 Encounter for preprocedural laboratory examination: Secondary | ICD-10-CM | POA: Insufficient documentation

## 2019-05-31 LAB — SARS CORONAVIRUS 2 (TAT 6-24 HRS): SARS Coronavirus 2: NEGATIVE

## 2019-06-04 ENCOUNTER — Encounter: Payer: Self-pay | Admitting: *Deleted

## 2019-06-05 ENCOUNTER — Encounter
Admission: RE | Disposition: A | Discharge status: Home / Self Care | Payer: Self-pay | Source: Home / Self Care | Attending: Gastroenterology

## 2019-06-05 ENCOUNTER — Other Ambulatory Visit: Payer: Self-pay

## 2019-06-05 ENCOUNTER — Ambulatory Visit
Admission: RE | Admit: 2019-06-05 | Discharge: 2019-06-05 | Disposition: A | Discharge status: Home / Self Care | Payer: Medicare Other | Attending: Gastroenterology | Admitting: Gastroenterology

## 2019-06-05 ENCOUNTER — Ambulatory Visit: Payer: Medicare Other | Admitting: Anesthesiology

## 2019-06-05 ENCOUNTER — Encounter: Payer: Self-pay | Admitting: *Deleted

## 2019-06-05 DIAGNOSIS — Z7951 Long term (current) use of inhaled steroids: Secondary | ICD-10-CM | POA: Insufficient documentation

## 2019-06-05 DIAGNOSIS — K219 Gastro-esophageal reflux disease without esophagitis: Secondary | ICD-10-CM | POA: Diagnosis not present

## 2019-06-05 DIAGNOSIS — Z1211 Encounter for screening for malignant neoplasm of colon: Secondary | ICD-10-CM

## 2019-06-05 DIAGNOSIS — M797 Fibromyalgia: Secondary | ICD-10-CM | POA: Diagnosis not present

## 2019-06-05 DIAGNOSIS — Z800 Family history of malignant neoplasm of digestive organs: Secondary | ICD-10-CM

## 2019-06-05 DIAGNOSIS — I2510 Atherosclerotic heart disease of native coronary artery without angina pectoris: Secondary | ICD-10-CM | POA: Diagnosis not present

## 2019-06-05 DIAGNOSIS — F419 Anxiety disorder, unspecified: Secondary | ICD-10-CM | POA: Insufficient documentation

## 2019-06-05 DIAGNOSIS — J45909 Unspecified asthma, uncomplicated: Secondary | ICD-10-CM | POA: Insufficient documentation

## 2019-06-05 DIAGNOSIS — Z79899 Other long term (current) drug therapy: Secondary | ICD-10-CM | POA: Insufficient documentation

## 2019-06-05 DIAGNOSIS — E785 Hyperlipidemia, unspecified: Secondary | ICD-10-CM | POA: Insufficient documentation

## 2019-06-05 DIAGNOSIS — J449 Chronic obstructive pulmonary disease, unspecified: Secondary | ICD-10-CM | POA: Insufficient documentation

## 2019-06-05 HISTORY — DX: Other cervical disc degeneration, unspecified cervical region: M50.30

## 2019-06-05 HISTORY — PX: COLONOSCOPY WITH PROPOFOL: SHX5780

## 2019-06-05 SURGERY — COLONOSCOPY WITH PROPOFOL
Anesthesia: General

## 2019-06-05 MED ORDER — PROPOFOL 10 MG/ML IV BOLUS
INTRAVENOUS | Status: DC | PRN
  Administered 2019-06-05: 80 mg via INTRAVENOUS
  Administered 2019-06-05: 34 mg via INTRAVENOUS

## 2019-06-05 MED ORDER — LIDOCAINE HCL (PF) 1 % IJ SOLN
INTRAMUSCULAR | Status: AC
  Filled 2019-06-05: qty 2

## 2019-06-05 MED ORDER — PROPOFOL 500 MG/50ML IV EMUL
INTRAVENOUS | Status: DC | PRN
  Administered 2019-06-05: 130 ug/kg/min via INTRAVENOUS

## 2019-06-05 MED ORDER — SODIUM CHLORIDE 0.9 % IV SOLN
INTRAVENOUS | Status: DC
  Administered 2019-06-05: 13:00:00 via INTRAVENOUS

## 2019-06-05 MED ORDER — PROPOFOL 500 MG/50ML IV EMUL
INTRAVENOUS | Status: AC
  Filled 2019-06-05: qty 50

## 2019-06-05 MED ORDER — LIDOCAINE HCL (CARDIAC) PF 100 MG/5ML IV SOSY
PREFILLED_SYRINGE | INTRAVENOUS | Status: DC | PRN
  Administered 2019-06-05: 50 mg via INTRAVENOUS

## 2019-06-05 MED ORDER — LIDOCAINE HCL (PF) 2 % IJ SOLN
INTRAMUSCULAR | Status: AC
  Filled 2019-06-05: qty 10

## 2019-06-05 NOTE — Op Note (Signed)
[facility] Gastroenterology Patient Name: Zoe Sanchez Procedure Date: 06/05/2019 12:40 PM MRN: [id number] Account #: [id number] Date of Birth: [birth year] Admit Type: Outpatient Age: 63 Room: [facility] ENDO ROOM 2 Gender: Female Note Status: Finalized Procedure:            Colonoscopy Indications:          Screening for colorectal malignant neoplasm Providers:            Jonathon Bellows MD, MD Referring MD:         Baltazar Apo, MD (Referring MD) Medicines:            Monitored Anesthesia Care Complications:        No immediate complications. Procedure:            Pre-Anesthesia Assessment:                       - Prior to the procedure, a History and Physical was                        performed, and patient medications, allergies and                        sensitivities were reviewed. The patient's tolerance of                        previous anesthesia was reviewed.                       - The risks and benefits of the procedure and the                        sedation options and risks were discussed with the                        patient. All questions were answered and informed                        consent was obtained.                       - ASA Grade Assessment: II - A patient with mild                        systemic disease.                       After obtaining informed consent, the colonoscope was                        passed under direct vision. Throughout the procedure,                        the patient's blood pressure, pulse, and oxygen                        saturations were monitored continuously. The                        Colonoscope was introduced through the anus with the  intention of advancing to the cecum. The scope was                        advanced to the ascending colon before the procedure                        was aborted. Medications were given. The colonoscopy                        was performed with ease. The  patient tolerated the                        procedure well. The quality of the bowel preparation                        was poor. Findings:      The perianal and digital rectal examinations were normal.      A large amount of semi-liquid stool was found in the entire colon,       interfering with visualization. Impression:           - Preparation of the colon was poor.                       - Stool in the entire examined colon.                       - No specimens collected. Recommendation:       - Discharge patient to home (with escort).                       - Resume previous diet.                       - Continue present medications.                       - Repeat colonoscopy in 2 weeks because the bowel                        preparation was suboptimal. Procedure Code(s):    --- Professional ---                       [phone], 53, Colonoscopy, flexible; diagnostic, including                        collection of specimen(s) by brushing or washing, when                        performed (separate procedure) Diagnosis Code(s):    --- Professional ---                       Z12.11, Encounter for screening for malignant neoplasm                        of colon CPT copyright 2019 American [employer] Association. All rights reserved. The codes documented in this report are preliminary and upon coder review may  be revised to meet current compliance requirements. Jonathon Bellows, MD Jonathon Bellows MD, MD 06/05/2019 1:03:39 PM This report has been signed electronically. Number of Addenda: 0 Note  Initiated On: 06/05/2019 12:40 PM Total Procedure Duration: 0 hours 17 minutes 41 seconds  Estimated Blood Loss: Estimated blood loss: none.      [facility]

## 2019-06-05 NOTE — Anesthesia Preprocedure Evaluation (Addendum)
Anesthesia Evaluation  Patient identified by MRN, date of birth, ID band Patient awake    Reviewed: Allergy & Precautions, NPO status , Patient's Chart, lab work & pertinent test results  History of Anesthesia Complications Negative for: history of anesthetic complications  Airway Mallampati: II  TM Distance: >3 FB Neck ROM: limited    Dental   Pulmonary neg shortness of breath, asthma , COPD,    breath sounds clear to auscultation- rhonchi       Cardiovascular Exercise Tolerance: Good hypertension, Pt. on home beta blockers + CAD  + dysrhythmias + Valvular Problems/Murmurs  Rhythm:Regular Rate:Normal     Neuro/Psych PSYCHIATRIC DISORDERS Anxiety Depression  Neuromuscular disease    GI/Hepatic Neg liver ROS, hiatal hernia, GERD  Controlled,  Endo/Other  negative endocrine ROS  Renal/GU negative Renal ROS     Musculoskeletal  (+) Arthritis , Fibromyalgia -  Abdominal   Peds  Hematology negative hematology ROS (+)   Anesthesia Other Findings Past Medical History: No date: Abnormal heart rhythm 02/25/2014: Abnormal mammogram No date: Anemia     Comment:  vitamin b12 and vitamin d deficiency No date: Anxiety No date: Asthma 06/29/2015: Awareness of heartbeats 06/29/2015: Breath shortness No date: CAD (coronary artery disease) No date: Cancer [facility])     Comment:  skin cancers 06/29/2015: Cervical radicular pain (Location of Secondary source of  pain) (Bilateral) (R>L) (C8 Dermatome) No date: Chronic pain No date: COPD (chronic obstructive pulmonary disease) (HCC) No date: DDD (degenerative disc disease), cervical No date: Degenerative disk disease No date: Depression No date: Dysrhythmia     Comment:  svt No date: Endometriosis 06/29/2015: Family history of chronic pain No date: Fibromyalgia No date: Fibromyalgia No date: GERD (gastroesophageal reflux disease) No date: Heart murmur No date: Hiatal  hernia 06/29/2015: History of attempted suicide 06/29/2015: History of hiatal hernia 06/29/2015: History of psychiatric disorder 06/29/2015: History of suicidal ideation No date: Hyperlipidemia No date: Insomnia No date: Insomnia No date: Irritable bowel syndrome  Past Surgical History: 1986: ABDOMINAL HYSTERECTOMY No date: ANKLE SURGERY     Comment:  x2 left foot 07/2018 2016: BREAST BIOPSY; Right     Comment:  CORE W/CLIP - EARLY INTRADUCTAL PAPILLOMA No date: BREAST SURGERY 1986: OOPHORECTOMY     Comment:  same time as hysterectomy 03/14/2018: ORIF ANKLE FRACTURE; Left     Comment:  Procedure: OPEN REDUCTION INTERNAL FIXATION (ORIF) ANKLE              FRACTURE;  Surgeon: Lovell Sheehan, MD;  Location: [facility]               ORS;  Service: Orthopedics;  Laterality: Left; 07/26/2018: ORIF ANKLE FRACTURE; Left     Comment:  Procedure: Left fibula osteotomy; revision syndesmosis               open reduction internal fixation; revision medial               malleolus open reduction internal fixation;  Surgeon:               Wylene Simmer, MD;  Location: [place];                Service: Orthopedics;  Laterality: Left; No date: SKIN LESION EXCISION     Comment:  basal cell and squamous cell 1957: TONSILLECTOMY   Reproductive/Obstetrics  Anesthesia Physical  Anesthesia Plan  ASA: III  Anesthesia Plan: General   Post-op Pain Management: GA combined w/ Regional for post-op pain   Induction: Intravenous  PONV Risk Score and Plan: 3 and Treatment may vary due to age or medical condition, Propofol infusion and TIVA  Airway Management Planned: Natural Airway and Nasal Cannula  Additional Equipment:   Intra-op Plan:   Post-operative Plan: Extubation in OR  Informed Consent: I have reviewed the patients History and Physical, chart, labs and discussed the procedure including the risks, benefits and alternatives  for the proposed anesthesia with the patient or authorized representative who has indicated his/her understanding and acceptance.     Dental advisory given  Plan Discussed with: CRNA  Anesthesia Plan Comments: (Patient consented for risks of anesthesia including but not limited to:  - adverse reactions to medications - risk of intubation if required - damage to teeth, lips or other oral mucosa - sore throat or hoarseness - Damage to heart, brain, lungs or loss of life  Patient voiced understanding.)       Anesthesia Quick Evaluation

## 2019-06-05 NOTE — Anesthesia Post-op Follow-up Note (Signed)
Anesthesia QCDR form completed.        

## 2019-06-05 NOTE — Transfer of Care (Signed)
Immediate Anesthesia Transfer of Care Note  Patient: Zoe Sanchez  Procedure(s) Performed: COLONOSCOPY WITH PROPOFOL (N/A )  Patient Location: PACU and Endoscopy Unit  Anesthesia Type:General  Level of Consciousness: drowsy  Airway & Oxygen Therapy: Patient Spontanous Breathing  Post-op Assessment: Report given to RN and Post -op Vital signs reviewed and stable  Post vital signs: Reviewed and stable  Last Vitals:  Vitals Value Taken Time  BP 92/41   Temp 36.1 C 06/05/19 1307  Pulse 71 06/05/19 1308  Resp 12 06/05/19 1308  SpO2 99 % 06/05/19 1308  Vitals shown include unvalidated device data.  Last Pain:  Vitals:   06/05/19 1307  TempSrc: Tympanic         Complications: No apparent anesthesia complications

## 2019-06-05 NOTE — OR Nursing (Signed)
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

## 2019-06-05 NOTE — H&P (Signed)
Jonathon Bellows, MD [address] [place], [place], [place], [address] [address], [place], [place], [place], [address] Phone: [phone]  Fax: [phone]  Primary Care Physician:  Denton Lank, MD   Pre-Procedure History & Physical: HPI:  Zoe Sanchez is a 63 y.o. female is here for an colonoscopy.   Past Medical History:  Diagnosis Date  . Abnormal heart rhythm   . Abnormal mammogram 02/25/2014  . Anemia    vitamin b12 and vitamin d deficiency  . Anxiety   . Asthma   . Awareness of heartbeats 06/29/2015  . Breath shortness 06/29/2015  . CAD (coronary artery disease)   . Cancer ([place])    skin cancers  . Cervical radicular pain (Location of Secondary source of pain) (Bilateral) (R>L) (C8 Dermatome) 06/29/2015  . Chronic pain   . COPD (chronic obstructive pulmonary disease) ([place])   . DDD (degenerative disc disease), cervical   . Degenerative disk disease   . Depression   . Dysrhythmia    svt  . Endometriosis   . Family history of chronic pain 06/29/2015  . Fibromyalgia   . Fibromyalgia   . GERD (gastroesophageal reflux disease)   . Heart murmur   . Hiatal hernia   . History of attempted suicide 06/29/2015  . History of hiatal hernia 06/29/2015  . History of psychiatric disorder 06/29/2015  . History of suicidal ideation 06/29/2015  . Hyperlipidemia   . Insomnia   . Insomnia   . Irritable bowel syndrome     Past Surgical History:  Procedure Laterality Date  . ABDOMINAL HYSTERECTOMY  1986  . ANKLE SURGERY     x2 left foot 07/2018  . BREAST BIOPSY Right 2016   CORE W/CLIP - EARLY INTRADUCTAL PAPILLOMA  . BREAST SURGERY    . OOPHORECTOMY  1986   same time as hysterectomy  . ORIF ANKLE FRACTURE Left 03/14/2018   Procedure: OPEN REDUCTION INTERNAL FIXATION (ORIF) ANKLE FRACTURE;  Surgeon: Lovell Sheehan, MD;  Location: [facility] ORS;  Service: Orthopedics;  Laterality: Left;  . ORIF ANKLE FRACTURE Left 07/26/2018   Procedure: Left fibula osteotomy;  revision syndesmosis open reduction internal fixation; revision medial malleolus open reduction internal fixation;  Surgeon: Wylene Simmer, MD;  Location: [place];  Service: Orthopedics;  Laterality: Left;  . SKIN LESION EXCISION     basal cell and squamous cell  . TONSILLECTOMY  1957    Prior to Admission medications   Medication Sig Start Date End Date Taking? Authorizing Provider  albuterol (PROAIR HFA) 108 (90 Base) MCG/ACT inhaler ProAir HFA 90 mcg/actuation aerosol inhaler   Yes [provider]  amitriptyline (ELAVIL) 100 MG tablet Take 200 mg by mouth at bedtime.  02/17/14  Yes [provider]  Cholecalciferol (VITAMIN D3) 50 MCG (2000 UT) capsule Take 1 capsule (2,000 Units total) by mouth daily. 05/01/19 07/30/19 Yes Milinda Pointer, MD  cyanocobalamin ([employer] VITAMIN B12) 2000 MCG tablet Take 1 tablet (2,000 mcg total) by mouth daily. 05/01/19 07/30/19 Yes Milinda Pointer, MD  cyclobenzaprine (FLEXERIL) 5 MG tablet Take 1 tablet (5 mg total) by mouth every 6 (six) hours as needed for muscle spasms. 05/01/19 07/30/19 Yes Milinda Pointer, MD  dicyclomine (BENTYL) 20 MG tablet Take 20 mg by mouth daily as needed for spasms.  06/06/16  Yes [provider]  estradiol (ESTRACE) 1 MG tablet Take 1 mg by mouth daily.  05/19/16  Yes [provider]  fluticasone (FLONASE) 50 MCG/ACT nasal spray  Place 2 sprays into both nostrils daily.  11/14/16  Yes [provider]  gemfibrozil (LOPID) 600 MG tablet Take 600 mg by mouth 2 (two) times daily before a meal.  02/17/14  Yes [provider]  HYDROcodone-acetaminophen (NORCO/VICODIN) 5-325 MG tablet Take 1 tablet by mouth every 4 (four) hours as needed for severe pain. Must last 30 days 05/31/19 06/30/19 Yes Milinda Pointer, MD  HYDROcodone-acetaminophen (NORCO/VICODIN) 5-325 MG tablet Take 1 tablet by mouth every 4 (four) hours as needed for severe pain. Must last 30 days 06/30/19  07/30/19 Yes Milinda Pointer, MD  INCRUSE ELLIPTA 62.5 MCG/INH AEPB Inhale 1 puff into the lungs daily.  02/18/16  Yes [provider]  magnesium gluconate (MAGONATE) 500 MG tablet Take 500 mg by mouth daily.   Yes [provider]  Melatonin 10 MG CAPS Take 20 mg by mouth at bedtime as needed. 05/01/19 07/30/19 Yes Milinda Pointer, MD  metroNIDAZOLE (FLAGYL) 500 MG tablet Take 500 mg by mouth 2 (two) times daily. 01/16/18  Yes [provider]  montelukast (SINGULAIR) 10 MG tablet Take by mouth at bedtime.  02/17/14  Yes [provider]  omeprazole (PRILOSEC) 20 MG capsule Take 20 mg by mouth 2 (two) times daily before a meal.  02/17/14  Yes [provider]  pregabalin (LYRICA) 100 MG capsule Take 1 capsule (100 mg total) by mouth at bedtime. 05/01/19 07/30/19 Yes Milinda Pointer, MD  pregabalin (LYRICA) 25 MG capsule Take 1 capsule (25 mg total) by mouth at bedtime. 05/01/19 07/30/19 Yes Milinda Pointer, MD  propranolol (INDERAL) 40 MG tablet TAKE 40 - 280MG  BY MOUTH DAILY AS NEEDED 04/17/17  Yes [provider]  propranolol ER (INDERAL LA) 80 MG 24 hr capsule TAKE 2 CAPSULES BY MOUTH ONCE DAILY at bedtime 09/20/16  Yes [provider]  salmeterol (SEREVENT DISKUS) 50 MCG/DOSE diskus inhaler Inhale 1 puff into the lungs daily.    Yes [provider]  senna (SENOKOT) 8.6 MG TABS tablet Take 2 tablets (17.2 mg total) by mouth 2 (two) times daily. 07/26/18  Yes Corky Sing, PA-C  zolpidem (AMBIEN) 10 MG tablet Take 10 mg by mouth at bedtime.  03/31/17  Yes [provider]  HYDROcodone-acetaminophen (NORCO/VICODIN) 5-325 MG tablet Take 1 tablet by mouth every 4 (four) hours as needed for severe pain. Must last 30 days 05/01/19 05/31/19  Milinda Pointer, MD    Allergies as of 05/15/2019 - Review Complete 04/10/2019  Allergen Reaction Noted  . Prednisone Palpitations 03/09/2016    Family History  Problem Relation  Age of Onset  . Heart disease Father   . Hypertension Father   . Hypertension Mother   . Breast cancer Maternal Aunt     Social History   Socioeconomic History  . Marital status: Single    Spouse name: Not on file  . Number of children: Not on file  . Years of education: Not on file  . Highest education level: Not on file  Occupational History  . Not on file  Social Needs  . Financial resource strain: Not on file  . Food insecurity    Worry: Not on file    Inability: Not on file  . Transportation needs    Medical: Not on file    Non-medical: Not on file  Tobacco Use  . Smoking status: Never Smoker  . Smokeless tobacco: Never Used  Substance and Sexual Activity  . Alcohol use: No    Alcohol/week: 0.0 standard drinks  .  Drug use: No  . Sexual activity: Not Currently    Birth control/protection: Surgical  Lifestyle  . Physical activity    Days per week: Not on file    Minutes per session: Not on file  . Stress: Not on file  Relationships  . Social Herbalist on phone: Not on file    Gets together: Not on file    Attends religious service: Not on file    Active member of club or organization: Not on file    Attends meetings of clubs or organizations: Not on file    Relationship status: Not on file  . Intimate partner violence    Fear of current or ex partner: Not on file    Emotionally abused: Not on file    Physically abused: Not on file    Forced sexual activity: Not on file  Other Topics Concern  . Not on file  Social History Narrative  . Not on file    Review of Systems: See HPI, otherwise negative ROS  Physical Exam: BP (!) 142/75   Pulse 72   Temp (!) 97.5 F (36.4 C) (Tympanic)   Resp 16   Ht 5\' 3"  (1.6 m)   Wt 68.9 kg   SpO2 100%   BMI 26.93 kg/m  General:   Alert,  pleasant and cooperative in NAD Head:  Normocephalic and atraumatic. Neck:  Supple; no masses or thyromegaly. Lungs:  Clear throughout to auscultation, normal  respiratory effort.    Heart:  +S1, +S2, Regular rate and rhythm, No edema. Abdomen:  Soft, nontender and nondistended. Normal bowel sounds, without guarding, and without rebound.   Neurologic:  Alert and  oriented x4;  grossly normal neurologically.  Impression/Plan: Zoe Sanchez is here for an colonoscopy to be performed for Screening colonoscopy average risk   Risks, benefits, limitations, and alternatives regarding  colonoscopy have been reviewed with the patient.  Questions have been answered.  All parties agreeable.   Jonathon Bellows, MD  06/05/2019, 12:15 PM

## 2019-06-06 ENCOUNTER — Encounter: Payer: Self-pay | Admitting: Gastroenterology

## 2019-06-06 NOTE — Anesthesia Postprocedure Evaluation (Signed)
Anesthesia Post Note  Patient: Zoe Sanchez  Procedure(s) Performed: COLONOSCOPY WITH PROPOFOL (N/A )  Patient location during evaluation: Endoscopy Anesthesia Type: General Level of consciousness: awake and alert Pain management: pain level controlled Vital Signs Assessment: post-procedure vital signs reviewed and stable Respiratory status: spontaneous breathing, nonlabored ventilation, respiratory function stable and patient connected to nasal cannula oxygen Cardiovascular status: blood pressure returned to baseline and stable Postop Assessment: no apparent nausea or vomiting Anesthetic complications: no     Last Vitals:  Vitals:   06/05/19 1307 06/05/19 1317  BP: (!) 92/41 115/60  Pulse: 71   Resp: 12   Temp: (!) 36.1 C   SpO2: 99%     Last Pain:  Vitals:   06/05/19 1317  TempSrc:   PainSc: 0-No pain                 Precious Haws Piscitello

## 2019-06-24 ENCOUNTER — Ambulatory Visit
Admission: RE | Admit: 2019-06-24 | Discharge: 2019-06-24 | Disposition: A | Discharge status: Home / Self Care | Payer: Medicare Other | Source: Ambulatory Visit | Attending: Family Medicine | Admitting: Family Medicine

## 2019-06-24 DIAGNOSIS — Z1231 Encounter for screening mammogram for malignant neoplasm of breast: Secondary | ICD-10-CM | POA: Diagnosis present

## 2019-06-28 ENCOUNTER — Other Ambulatory Visit: Payer: Self-pay | Admitting: Pain Medicine

## 2019-06-28 DIAGNOSIS — G894 Chronic pain syndrome: Secondary | ICD-10-CM

## 2019-07-17 ENCOUNTER — Ambulatory Visit: Payer: Medicare Other | Admitting: Pain Medicine

## 2019-07-25 ENCOUNTER — Telehealth: Payer: Self-pay | Admitting: Pain Medicine

## 2019-07-25 ENCOUNTER — Encounter: Payer: Self-pay | Admitting: Pain Medicine

## 2019-07-25 NOTE — Progress Notes (Signed)
No post-procedure evaluation required.

## 2019-07-25 NOTE — Telephone Encounter (Signed)
Patient called to clarify her appt for Monday. Wasn't sure if you hade tried to call for chart update.

## 2019-07-28 NOTE — Progress Notes (Signed)
Pain Management Virtual Encounter Note - Virtual Visit via Telephone Telehealth (real-time audio visits between healthcare provider and patient).   Patient's Phone No. & Preferred Pharmacy:  [phone] (home); [phone] (mobile); (Preferred) [phone] [email or web address]  [place], [place] [address] Phone: [phone] Fax: [phone]    Pre-screening note:  Our staff contacted Zoe Sanchez and offered her an "in person", "face-to-face" appointment versus a telephone encounter. She indicated preferring the telephone encounter, at this time.   Reason for Virtual Visit: COVID-19*  Social distancing based on CDC and AMA recommendations.   I contacted Zoe Sanchez on 07/29/2019 via telephone.      I clearly identified myself as Gaspar Cola, MD. I verified that I was speaking with the correct person using two identifiers (Name: Zoe Sanchez, and date of birth: 12-21-1955).  Advanced Informed Consent I sought verbal advanced consent from Zoe Sanchez for virtual visit interactions. I informed Zoe Sanchez of possible security and privacy concerns, risks, and limitations associated with providing "not-in-person" medical evaluation and management services. I also informed Zoe Sanchez of the availability of "in-person" appointments. Finally, I informed her that there would be a charge for the virtual visit and that she could be  personally, fully or partially, financially responsible for it. Zoe Sanchez expressed understanding and agreed to proceed.   Historic Elements   Zoe Sanchez is a 63 y.o. year old, female patient evaluated today after her last encounter by our practice on 07/25/2019. Zoe Sanchez  has a past medical history of Abnormal heart rhythm, Abnormal mammogram (02/25/2014), Anemia, Anxiety, Asthma, Awareness of heartbeats (06/29/2015), Breath shortness (06/29/2015), CAD (coronary  artery disease), Cancer ([place]), Cervical radicular pain (Location of Secondary source of pain) (Bilateral) (R>L) (C8 Dermatome) (06/29/2015), Chronic pain, COPD (chronic obstructive pulmonary disease) ([place]), DDD (degenerative disc disease), cervical, Degenerative disk disease, Depression, Dysrhythmia, Endometriosis, Family history of chronic pain (06/29/2015), Fibromyalgia, Fibromyalgia, GERD (gastroesophageal reflux disease), Heart murmur, Hiatal hernia, History of attempted suicide (06/29/2015), History of hiatal hernia (06/29/2015), History of psychiatric disorder (06/29/2015), History of suicidal ideation (06/29/2015), Hyperlipidemia, Insomnia, Insomnia, and Irritable bowel syndrome. She also  has a past surgical history that includes Abdominal hysterectomy (1986); Tonsillectomy (1957); Skin lesion excision; Oophorectomy (1986); Breast surgery; ORIF ankle fracture (Left, 03/14/2018); ORIF ankle fracture (Left, 07/26/2018); Ankle surgery; Colonoscopy with propofol (N/A, 06/05/2019); and Breast biopsy (Right, 2016). Zoe Sanchez has a current medication list which includes the following prescription(s): albuterol, amitriptyline, vitamin d3, cyanocobalamin, cyclobenzaprine, dicyclomine, estradiol, fluticasone, gemfibrozil, hydrocodone-acetaminophen, hydrocodone-acetaminophen, hydrocodone-acetaminophen, incruse ellipta, magnesium gluconate, melatonin, metronidazole, montelukast, omeprazole, pregabalin, pregabalin, propranolol, propranolol er, salmeterol, and zolpidem. She  reports that she has never smoked. She has never used smokeless tobacco. She reports that she does not drink alcohol or use drugs. Zoe Sanchez is allergic to prednisone.   HPI  Today, she is being contacted for medication management.  The patient indicates doing well with the current medication regimen. No adverse reactions or side effects reported to the medications.   Pharmacotherapy Assessment  Analgesic: Hydrocodone/APAP 5/325, 1 tab PO q 4  hrs (30 mg/day of hydrocodone)(1,950 mg/day of acetaminophen) MME/day:30 mg/day.   Monitoring: Pharmacotherapy: No side-effects or adverse reactions reported. [place] PMP: PDMP reviewed during this encounter.       Compliance: No problems identified. Effectiveness: Clinically acceptable. Plan: Refer to "POC".  UDS:  Summary  Date Value Ref Range Status  07/23/2018 FINAL  Final    Comment:    ====================================================================  TOXASSURE SELECT 13 (MW) ==================================================================== Test                             Result       Flag       Units Drug Present and Declared for Prescription Verification   Hydrocodone                    3119         EXPECTED   ng/mg creat   Hydromorphone                  119          EXPECTED   ng/mg creat   Dihydrocodeine                 725          EXPECTED   ng/mg creat   Norhydrocodone                 3244         EXPECTED   ng/mg creat    Sources of hydrocodone include scheduled prescription    medications. Hydromorphone, dihydrocodeine and norhydrocodone are    expected metabolites of hydrocodone. Hydromorphone and    dihydrocodeine are also available as scheduled prescription    medications. ==================================================================== Test                      Result    Flag   Units      Ref Range   Creatinine              63               mg/dL      >=20 ==================================================================== Declared Medications:  The flagging and interpretation on this report are based on the  following declared medications.  Unexpected results may arise from  inaccuracies in the declared medications.  **Note: The testing scope of this panel includes these medications:  Hydrocodone (Hydrocodone-Acetaminophen)  **Note: The testing scope of this panel does not include following  reported medications:  Acetaminophen  (Hydrocodone-Acetaminophen)  Amitriptyline (Elavil)  Cyanocobalamin  Cyclobenzaprine  Dicyclomine (Bentyl)  Estradiol (Estrace)  Fluticasone (Flonase)  Gemfibrozil (Lopid)  Magnesium (Magonate)  Melatonin  Metronidazole (Flagyl)  Montelukast (Singulair)  Omeprazole (Prilosec)  Pregabalin (Lyrica)  Propranolol (Inderal)  Salmeterol (Serevent)  Umeclidinium (Incruse Ellipta)  Vitamin D3  Zolpidem (Ambien) ==================================================================== For clinical consultation, please call [phone]. ====================================================================    Laboratory Chemistry Profile (12 mo)  Renal: 04/26/2019: BUN 21; Creatinine, Ser 0.84  Lab Results  Component Value Date   GFRAA >60 04/26/2019   GFRNONAA >60 04/26/2019   Hepatic: 04/26/2019: Albumin 4.1 Lab Results  Component Value Date   AST 35 04/26/2019   ALT 22 04/26/2019   Other: 04/26/2019: CRP 2.2; Sed Rate 39; Vit D, 25-Hydroxy 40.8; Vitamin B-12 708 Note: Above Lab results reviewed.  Imaging  Last 90 days:  Mm 3d Screen Breast Bilateral  Result Date: 06/25/2019 CLINICAL DATA:  Screening. EXAM: DIGITAL SCREENING BILATERAL MAMMOGRAM WITH TOMO AND CAD COMPARISON:  Previous exam(s). ACR Breast Density Category c: The breast tissue is heterogeneously dense, which may obscure small masses. FINDINGS: There are no findings suspicious for malignancy. Images were processed with CAD. IMPRESSION: No mammographic evidence of malignancy. A result letter of this screening mammogram will be mailed directly to the patient. RECOMMENDATION: Screening mammogram in one year. (  Code:SM-B-01Y) BI-RADS CATEGORY  1: Negative. Electronically Signed   By: Margarette Canada M.D.   On: 06/25/2019 11:04    Assessment  The primary encounter diagnosis was Chronic pain syndrome. Diagnoses of Chronic neck pain (Primary Area of Pain) (Bilateral) (R>L), Chronic upper extremity pain (Secondary area of Pain)  (Bilateral) (R>L), Chronic low back pain (Third area of Pain) (Bilateral) (R>L), Fibromyalgia, Chronic musculoskeletal pain, Myofascial pain syndrome, Neurogenic pain, Insomnia secondary to chronic pain, Vitamin B12 deficiency, and Vitamin D insufficiency were also pertinent to this visit.  Plan of Care  I have discontinued Zoe Sanchez's senna. I have also changed her cyclobenzaprine. Additionally, I am having her start on HYDROcodone-acetaminophen and HYDROcodone-acetaminophen. Lastly, I am having her maintain her omeprazole, amitriptyline, montelukast, gemfibrozil, Incruse Ellipta, dicyclomine, estradiol, salmeterol, fluticasone, propranolol ER, zolpidem, propranolol, metroNIDAZOLE, magnesium gluconate, albuterol, Melatonin, cyanocobalamin, Vitamin D3, pregabalin, pregabalin, and HYDROcodone-acetaminophen.  Pharmacotherapy (Medications Ordered): Meds ordered this encounter  Medications  . Melatonin 10 MG CAPS    Sig: Take 20 mg by mouth at bedtime as needed.    Dispense:  60 capsule    Refill:  2    Fill one day early if pharmacy is closed on scheduled refill date. May substitute for generic if available.  . cyanocobalamin ([facility] VITAMIN B12) 2000 MCG tablet    Sig: Take 1 tablet (2,000 mcg total) by mouth daily.    Dispense:  30 tablet    Refill:  11    Fill one day early if pharmacy is closed on scheduled refill date. May substitute for generic if available.  . Cholecalciferol (VITAMIN D3) 50 MCG (2000 UT) capsule    Sig: Take 1 capsule (2,000 Units total) by mouth daily.    Dispense:  30 capsule    Refill:  11    Fill one day early if pharmacy is closed on scheduled refill date. May substitute for generic if available.  . cyclobenzaprine (FLEXERIL) 5 MG tablet    Sig: Take 1 tablet (5 mg total) by mouth 3 (three) times daily as needed for muscle spasms.    Dispense:  90 tablet    Refill:  5    Fill one day early if pharmacy is closed on scheduled refill date. May substitute for  generic if available.  . pregabalin (LYRICA) 100 MG capsule    Sig: Take 1 capsule (100 mg total) by mouth at bedtime.    Dispense:  30 capsule    Refill:  5    Fill one day early if pharmacy is closed on scheduled refill date. May substitute for generic if available.  . pregabalin (LYRICA) 25 MG capsule    Sig: Take 1 capsule (25 mg total) by mouth at bedtime.    Dispense:  30 capsule    Refill:  5    Fill one day early if pharmacy is closed on scheduled refill date. May substitute for generic if available.  Marland Kitchen HYDROcodone-acetaminophen (NORCO/VICODIN) 5-325 MG tablet    Sig: Take 1 tablet by mouth every 4 (four) hours as needed for severe pain. Must last 30 days    Dispense:  180 tablet    Refill:  0    Chronic Pain: STOP Act (Not applicable) Fill 1 day early if closed on refill date. Do not fill until: 07/30/2019. To last until: 08/29/2019. Avoid benzodiazepines within 8 hours of opioids  . HYDROcodone-acetaminophen (NORCO/VICODIN) 5-325 MG tablet    Sig: Take 1 tablet by mouth every 4 (four) hours as needed  for severe pain. Must last 30 days    Dispense:  180 tablet    Refill:  0    Chronic Pain: STOP Act (Not applicable) Fill 1 day early if closed on refill date. Do not fill until: 08/29/2019. To last until: 09/28/2019. Avoid benzodiazepines within 8 hours of opioids  . HYDROcodone-acetaminophen (NORCO/VICODIN) 5-325 MG tablet    Sig: Take 1 tablet by mouth every 4 (four) hours as needed for severe pain. Must last 30 days    Dispense:  180 tablet    Refill:  0    Chronic Pain: STOP Act (Not applicable) Fill 1 day early if closed on refill date. Do not fill until: 09/28/2019. To last until: 10/28/2019. Avoid benzodiazepines within 8 hours of opioids   Orders:  No orders of the defined types were placed in this encounter.  Follow-up plan:   No follow-ups on file.      Interventional management options:  Considering:   Diagnostic bilateral cervical facetblock  Possible bilateral  cervical facet RFA  Diagnostic right CESI  Diagnostic bilateral IA shoulder jointinjection  Diagnostic bilateral suprascapular NB  Possible bilateral suprascapular nerve RFA  Possible bilateral lumbar facet RFA  Diagnostic left sided carpal tunnel injection    Palliative PRN treatment(s):   Diagnostic bilateral lumbar facetblock #3 (w/o Steroids)      Recent Visits Date Type Provider Dept  05/01/19 Office Visit Milinda Pointer, MD [facility]  Showing recent visits within past 90 days and meeting all other requirements   Today's Visits Date Type Provider Dept  07/29/19 Telemedicine Milinda Pointer, MD [facility]  Showing today's visits and meeting all other requirements   Future Appointments No visits were found meeting these conditions.  Showing future appointments within next 90 days and meeting all other requirements   I discussed the assessment and treatment plan with the patient. The patient was provided an opportunity to ask questions and all were answered. The patient agreed with the plan and demonstrated an understanding of the instructions.  Patient advised to call back or seek an in-person evaluation if the symptoms or condition worsens.  Total duration of non-face-to-face encounter: 13 minutes.  Note by: Gaspar Cola, MD Date: 07/29/2019; Time: 2:53 PM  Note: This dictation was prepared with Dragon dictation. Any transcriptional errors that may result from this process are unintentional.  Disclaimer:  * Given the special circumstances of the COVID-19 pandemic, the federal government has announced that the Office for Civil Rights (OCR) will exercise its enforcement discretion and will not impose penalties on physicians using telehealth in the event of noncompliance with regulatory requirements under the [place] (HIPAA) in connection with the good faith provision of telehealth during the  [id number] national public health emergency. ([place])

## 2019-07-29 ENCOUNTER — Ambulatory Visit: Payer: Medicare Other | Attending: Pain Medicine | Admitting: Pain Medicine

## 2019-07-29 ENCOUNTER — Other Ambulatory Visit: Payer: Self-pay

## 2019-07-29 DIAGNOSIS — M79602 Pain in left arm: Secondary | ICD-10-CM

## 2019-07-29 DIAGNOSIS — E559 Vitamin D deficiency, unspecified: Secondary | ICD-10-CM

## 2019-07-29 DIAGNOSIS — G8929 Other chronic pain: Secondary | ICD-10-CM

## 2019-07-29 DIAGNOSIS — M5442 Lumbago with sciatica, left side: Secondary | ICD-10-CM | POA: Diagnosis not present

## 2019-07-29 DIAGNOSIS — M542 Cervicalgia: Secondary | ICD-10-CM

## 2019-07-29 DIAGNOSIS — M797 Fibromyalgia: Secondary | ICD-10-CM

## 2019-07-29 DIAGNOSIS — M79601 Pain in right arm: Secondary | ICD-10-CM | POA: Diagnosis not present

## 2019-07-29 DIAGNOSIS — E538 Deficiency of other specified B group vitamins: Secondary | ICD-10-CM

## 2019-07-29 DIAGNOSIS — G894 Chronic pain syndrome: Secondary | ICD-10-CM

## 2019-07-29 DIAGNOSIS — G4701 Insomnia due to medical condition: Secondary | ICD-10-CM

## 2019-07-29 DIAGNOSIS — M792 Neuralgia and neuritis, unspecified: Secondary | ICD-10-CM

## 2019-07-29 DIAGNOSIS — M7918 Myalgia, other site: Secondary | ICD-10-CM

## 2019-07-29 DIAGNOSIS — M5441 Lumbago with sciatica, right side: Secondary | ICD-10-CM

## 2019-07-29 MED ORDER — HYDROCODONE-ACETAMINOPHEN 5-325 MG PO TABS: 1.0000 | ORAL_TABLET | ORAL | 0 refills | Status: DC | PRN

## 2019-07-29 MED ORDER — MELATONIN 10 MG PO CAPS: 20.0000 mg | ORAL_CAPSULE | Freq: Every evening | ORAL | 2 refills | Status: DC | PRN

## 2019-07-29 MED ORDER — PREGABALIN 100 MG PO CAPS: 100.0000 mg | ORAL_CAPSULE | Freq: Every day | ORAL | 5 refills | Status: DC

## 2019-07-29 MED ORDER — CYCLOBENZAPRINE HCL 5 MG PO TABS: 5.0000 mg | ORAL_TABLET | Freq: Three times a day (TID) | ORAL | 5 refills | Status: DC | PRN

## 2019-07-29 MED ORDER — CYANOCOBALAMIN 2000 MCG PO TABS: 2000.0000 ug | ORAL_TABLET | Freq: Every day | ORAL | 11 refills | Status: DC

## 2019-07-29 MED ORDER — PREGABALIN 25 MG PO CAPS: 25.0000 mg | ORAL_CAPSULE | Freq: Every day | ORAL | 5 refills | Status: DC

## 2019-07-29 MED ORDER — VITAMIN D3 50 MCG (2000 UT) PO CAPS: 2000.0000 [IU] | ORAL_CAPSULE | Freq: Every day | ORAL | 11 refills | Status: DC

## 2019-09-24 DIAGNOSIS — R600 Localized edema: Secondary | ICD-10-CM | POA: Insufficient documentation

## 2019-09-24 DIAGNOSIS — Z8659 Personal history of other mental and behavioral disorders: Secondary | ICD-10-CM | POA: Insufficient documentation

## 2019-09-24 DIAGNOSIS — E7849 Other hyperlipidemia: Secondary | ICD-10-CM | POA: Insufficient documentation

## 2019-09-24 DIAGNOSIS — J4541 Moderate persistent asthma with (acute) exacerbation: Secondary | ICD-10-CM | POA: Insufficient documentation

## 2019-10-29 ENCOUNTER — Other Ambulatory Visit: Payer: Self-pay | Admitting: Pain Medicine

## 2019-10-29 DIAGNOSIS — G894 Chronic pain syndrome: Secondary | ICD-10-CM

## 2019-10-30 ENCOUNTER — Encounter: Payer: Self-pay | Admitting: Pain Medicine

## 2019-10-31 ENCOUNTER — Ambulatory Visit: Payer: Medicare HMO | Attending: Pain Medicine | Admitting: Pain Medicine

## 2019-10-31 ENCOUNTER — Other Ambulatory Visit: Payer: Self-pay

## 2019-10-31 DIAGNOSIS — R69 Illness, unspecified: Secondary | ICD-10-CM | POA: Insufficient documentation

## 2019-10-31 DIAGNOSIS — M5442 Lumbago with sciatica, left side: Secondary | ICD-10-CM | POA: Diagnosis not present

## 2019-10-31 DIAGNOSIS — G8929 Other chronic pain: Secondary | ICD-10-CM

## 2019-10-31 DIAGNOSIS — M542 Cervicalgia: Secondary | ICD-10-CM

## 2019-10-31 DIAGNOSIS — M79602 Pain in left arm: Secondary | ICD-10-CM

## 2019-10-31 DIAGNOSIS — G894 Chronic pain syndrome: Secondary | ICD-10-CM

## 2019-10-31 DIAGNOSIS — M79601 Pain in right arm: Secondary | ICD-10-CM

## 2019-10-31 DIAGNOSIS — M5441 Lumbago with sciatica, right side: Secondary | ICD-10-CM

## 2019-10-31 DIAGNOSIS — R52 Pain, unspecified: Secondary | ICD-10-CM | POA: Insufficient documentation

## 2019-10-31 MED ORDER — HYDROCODONE-ACETAMINOPHEN 5-325 MG PO TABS: 1.0000 | ORAL_TABLET | ORAL | 0 refills | Status: DC | PRN

## 2019-10-31 NOTE — Progress Notes (Signed)
Patient: Zoe Sanchez  Service Category: E/M  Provider: Gaspar Cola, MD  DOB: 1955-12-15  DOS: 10/31/2019  Location: Office  MRN: [id number]  Setting: Ambulatory outpatient  Referring Provider: Denton Lank, MD  Type: Established Patient  Specialty: Interventional Pain Management  PCP: Denton Lank, MD  Location: Remote location  Delivery: TeleHealth     Virtual Encounter - Pain Management PROVIDER NOTE: Information contained herein reflects review and annotations entered in association with encounter. Interpretation of such information and data should be left to medically-trained personnel. Information provided to patient can be located elsewhere in the medical record under "Patient Instructions". Document created using STT-dictation technology, any transcriptional errors that may result from process are unintentional.    Contact & Pharmacy Preferred: [phone] Home: [phone] (home) Mobile: [phone] (mobile) E-mail: [email or web address]  [place], [place] - [address] [place] [address] Phone: [phone] Fax: [phone]   Pre-screening  Zoe Sanchez offered "in-person" vs "virtual" encounter. She indicated preferring virtual for this encounter.   Reason COVID-19*  Social distancing based on CDC and AMA recommendations.   I contacted Zoe Sanchez on 10/31/2019 via telephone.      I clearly identified myself as Gaspar Cola, MD. I verified that I was speaking with the correct person using two identifiers (Name: ARLEEN BAR, and date of birth: 12/05/1955).  Consent I sought verbal advanced consent from Zoe Sanchez for virtual visit interactions. I informed Zoe Sanchez of possible security and privacy concerns, risks, and limitations associated with providing "not-in-person" medical evaluation and management services. I also informed Zoe Sanchez of the availability of "in-person" appointments. Finally, I  informed her that there would be a charge for the virtual visit and that she could be  personally, fully or partially, financially responsible for it. Zoe Sanchez expressed understanding and agreed to proceed.   Historic Elements   Zoe Sanchez is a 64 y.o. year old, female patient evaluated today after her last contact with our practice on 10/29/2019. Zoe Sanchez  has a past medical history of Abnormal heart rhythm, Abnormal mammogram (02/25/2014), Anemia, Anxiety, Asthma, Awareness of heartbeats (06/29/2015), Breath shortness (06/29/2015), CAD (coronary artery disease), Cancer ([place]), Cervical radicular pain (Location of Secondary source of pain) (Bilateral) (R>L) (C8 Dermatome) (06/29/2015), Chronic pain, COPD (chronic obstructive pulmonary disease) ([place]), DDD (degenerative disc disease), cervical, Degenerative disk disease, Depression, Dysrhythmia, Endometriosis, Family history of chronic pain (06/29/2015), Fibromyalgia, Fibromyalgia, GERD (gastroesophageal reflux disease), Heart murmur, Hiatal hernia, History of attempted suicide (06/29/2015), History of hiatal hernia (06/29/2015), History of psychiatric disorder (06/29/2015), History of suicidal ideation (06/29/2015), Hyperlipidemia, Insomnia, Insomnia, and Irritable bowel syndrome. She also  has a past surgical history that includes Abdominal hysterectomy (1986); Tonsillectomy (1957); Skin lesion excision; Oophorectomy (1986); Breast surgery; ORIF ankle fracture (Left, 03/14/2018); ORIF ankle fracture (Left, 07/26/2018); Ankle surgery; Colonoscopy with propofol (N/A, 06/05/2019); and Breast biopsy (Right, 2016). Zoe Sanchez has a current medication list which includes the following prescription(s): albuterol, amitriptyline, vitamin d3, cyanocobalamin, cyclobenzaprine, dicyclomine, estradiol, fluticasone, gemfibrozil, incruse ellipta, magnesium gluconate, melatonin, metronidazole, montelukast, omeprazole, pregabalin, pregabalin, propranolol, propranolol  er, salmeterol, zolpidem, hydrocodone-acetaminophen, [START ON 11/30/2019] hydrocodone-acetaminophen, and [START ON 12/30/2019] hydrocodone-acetaminophen. She  reports that she has never smoked. She has never used smokeless tobacco. She reports that she does not drink alcohol or use drugs. Zoe Sanchez is allergic to prednisone.   HPI  Today, she is being contacted for medication management. The patient indicates doing well with the current  medication regimen. No adverse reactions or side effects reported to the medications.   Pharmacotherapy Assessment  Analgesic: Hydrocodone/APAP 5/325, 1 tab PO q 4 hrs (30 mg/day of hydrocodone)(1,950 mg/day of acetaminophen) MME/day:30 mg/day.   Monitoring: [place] PMP: PDMP reviewed during this encounter.       Pharmacotherapy: No side-effects or adverse reactions reported. Compliance: No problems identified. Effectiveness: Clinically acceptable. Plan: Refer to "POC".  UDS:  Summary  Date Value Ref Range Status  07/23/2018 FINAL  Final    Comment:    ==================================================================== TOXASSURE SELECT 13 (MW) ==================================================================== Test                             Result       Flag       Units Drug Present and Declared for Prescription Verification   Hydrocodone                    3119         EXPECTED   ng/mg creat   Hydromorphone                  119          EXPECTED   ng/mg creat   Dihydrocodeine                 725          EXPECTED   ng/mg creat   Norhydrocodone                 3244         EXPECTED   ng/mg creat    Sources of hydrocodone include scheduled prescription    medications. Hydromorphone, dihydrocodeine and norhydrocodone are    expected metabolites of hydrocodone. Hydromorphone and    dihydrocodeine are also available as scheduled prescription    medications. ==================================================================== Test                       Result    Flag   Units      Ref Range   Creatinine              63               mg/dL      >=20 ==================================================================== Declared Medications:  The flagging and interpretation on this report are based on the  following declared medications.  Unexpected results may arise from  inaccuracies in the declared medications.  **Note: The testing scope of this panel includes these medications:  Hydrocodone (Hydrocodone-Acetaminophen)  **Note: The testing scope of this panel does not include following  reported medications:  Acetaminophen (Hydrocodone-Acetaminophen)  Amitriptyline (Elavil)  Cyanocobalamin  Cyclobenzaprine  Dicyclomine (Bentyl)  Estradiol (Estrace)  Fluticasone (Flonase)  Gemfibrozil (Lopid)  Magnesium (Magonate)  Melatonin  Metronidazole (Flagyl)  Montelukast (Singulair)  Omeprazole (Prilosec)  Pregabalin (Lyrica)  Propranolol (Inderal)  Salmeterol (Serevent)  Umeclidinium (Incruse Ellipta)  Vitamin D3  Zolpidem (Ambien) ==================================================================== For clinical consultation, please call [phone]. ====================================================================    Laboratory Chemistry Profile   Renal Lab Results  Component Value Date   BUN 21 04/26/2019   CREATININE 0.84 04/26/2019   GFRAA >60 04/26/2019   GFRNONAA >60 04/26/2019    Hepatic Lab Results  Component Value Date   AST 35 04/26/2019   ALT 22 04/26/2019   ALBUMIN 4.1 04/26/2019   ALKPHOS 71 04/26/2019    Electrolytes Lab Results  Component Value Date   NA 135 04/26/2019   K 4.5 04/26/2019   CL 103 04/26/2019   CALCIUM 9.4 04/26/2019   MG 1.8 04/26/2019    Bone Lab Results  Component Value Date   VD25OH 40.8 04/26/2019   25OHVITD1 49 03/09/2016   25OHVITD2 <1.0 03/09/2016   25OHVITD3 48 03/09/2016    Inflammation (CRP: Acute Phase) (ESR: Chronic Phase) Lab Results  Component Value Date    CRP 2.2 (H) 04/26/2019   ESRSEDRATE 39 (H) 04/26/2019      Note: Above Lab results reviewed.  Imaging  MM 3D SCREEN BREAST BILATERAL CLINICAL DATA:  Screening.  EXAM: DIGITAL SCREENING BILATERAL MAMMOGRAM WITH TOMO AND CAD  COMPARISON:  Previous exam(s).  ACR Breast Density Category c: The breast tissue is heterogeneously dense, which may obscure small masses.  FINDINGS: There are no findings suspicious for malignancy. Images were processed with CAD.  IMPRESSION: No mammographic evidence of malignancy. A result letter of this screening mammogram will be mailed directly to the patient.  RECOMMENDATION: Screening mammogram in one year. (Code:[id number]-B-01Y)  BI-RADS CATEGORY  1: Negative.  Electronically Signed   By: Margarette Canada M.D.   On: 06/25/2019 11:04  Assessment  The primary encounter diagnosis was Chronic pain syndrome. Diagnoses of Chronic neck pain (Primary Area of Pain) (Bilateral) (R>L), Chronic upper extremity pain (Secondary area of Pain) (Bilateral) (R>L), Chronic low back pain (Third area of Pain) (Bilateral) (R>L), Taking multiple medications for chronic disease, and Multiple chronic diseases were also pertinent to this visit.  Plan of Care  Problem-specific:  No problem-specific Assessment & Plan notes found for this encounter.  Ms. ROBIN PETRAKIS has a current medication list which includes the following long-term medication(s): albuterol, amitriptyline, vitamin d3, cyanocobalamin, cyclobenzaprine, dicyclomine, estradiol, fluticasone, gemfibrozil, melatonin, montelukast, omeprazole, pregabalin, pregabalin, propranolol, propranolol er, salmeterol, hydrocodone-acetaminophen, [START ON 11/30/2019] hydrocodone-acetaminophen, and [START ON 12/30/2019] hydrocodone-acetaminophen.  Pharmacotherapy (Medications Ordered): Meds ordered this encounter  Medications  . HYDROcodone-acetaminophen (NORCO/VICODIN) 5-325 MG tablet    Sig: Take 1 tablet by mouth every 4 (four)  hours as needed for severe pain. Must last 30 days    Dispense:  180 tablet    Refill:  0    Chronic Pain: STOP Act (Not applicable) Fill 1 day early if closed on refill date. Do not fill until: 10/31/2019. To last until: 11/30/2019. Avoid benzodiazepines within 8 hours of opioids  . HYDROcodone-acetaminophen (NORCO/VICODIN) 5-325 MG tablet    Sig: Take 1 tablet by mouth every 4 (four) hours as needed for severe pain. Must last 30 days    Dispense:  180 tablet    Refill:  0    Chronic Pain: STOP Act (Not applicable) Fill 1 day early if closed on refill date. Do not fill until: 11/30/2019. To last until: 12/30/2019. Avoid benzodiazepines within 8 hours of opioids  . HYDROcodone-acetaminophen (NORCO/VICODIN) 5-325 MG tablet    Sig: Take 1 tablet by mouth every 4 (four) hours as needed for severe pain. Must last 30 days    Dispense:  180 tablet    Refill:  0    Chronic Pain: STOP Act (Not applicable) Fill 1 day early if closed on refill date. Do not fill until: 12/30/2019. To last until: 01/29/2020. Avoid benzodiazepines within 8 hours of opioids   Orders:  No orders of the defined types were placed in this encounter.  Follow-up plan:   Return in about 3 months (around 01/22/2020) for (VV), (MM).      Interventional  management options:  Considering:   Diagnostic bilateral cervical facetblock  Possible bilateral cervical facet RFA  Diagnostic right CESI  Diagnostic bilateral IA shoulder jointinjection  Diagnostic bilateral suprascapular NB  Possible bilateral suprascapular nerve RFA  Possible bilateral lumbar facet RFA  Diagnostic left sided carpal tunnel injection    Palliative PRN treatment(s):   Diagnostic bilateral lumbar facetblock #3 (w/o Steroids)    Recent Visits No visits were found meeting these conditions.  Showing recent visits within past 90 days and meeting all other requirements   Today's Visits Date Type Provider Dept  10/31/19 Telemedicine Milinda Pointer, MD  [facility]  Showing today's visits and meeting all other requirements   Future Appointments No visits were found meeting these conditions.  Showing future appointments within next 90 days and meeting all other requirements   I discussed the assessment and treatment plan with the patient. The patient was provided an opportunity to ask questions and all were answered. The patient agreed with the plan and demonstrated an understanding of the instructions.  Patient advised to call back or seek an in-person evaluation if the symptoms or condition worsens.  Duration of encounter: 12 minutes.  Note by: Gaspar Cola, MD Date: 10/31/2019; Time: 11:09 AM

## 2019-12-26 ENCOUNTER — Other Ambulatory Visit: Payer: Self-pay | Admitting: Pain Medicine

## 2019-12-26 DIAGNOSIS — M792 Neuralgia and neuritis, unspecified: Secondary | ICD-10-CM

## 2019-12-26 DIAGNOSIS — M797 Fibromyalgia: Secondary | ICD-10-CM

## 2020-01-17 NOTE — Progress Notes (Signed)
patient: Zoe Sanchez  Service Category: E/M  Provider: Gaspar Cola, MD  DOB: June 15, 1956  DOS: 01/20/2020  Location: Office  MRN: [id number]  Setting: Ambulatory outpatient  Referring Provider: Denton Lank, MD  Type: Established Patient  Specialty: Interventional Pain Management  PCP: Denton Lank, MD  Location: Remote location  Delivery: TeleHealth     Virtual Encounter - Pain Management PROVIDER NOTE: Information contained herein reflects review and annotations entered in association with encounter. Interpretation of such information and data should be left to medically-trained personnel. Information provided to patient can be located elsewhere in the medical record under "Patient Instructions". Document created using STT-dictation technology, any transcriptional errors that may result from process are unintentional.    Contact & Pharmacy Preferred: [phone] Home: [phone] (home) Mobile: [phone] (mobile) E-mail: [email or web address]  [facility] [place] - [address] [place] [address] Phone: [phone] Fax: [phone]   Pre-screening  Zoe Sanchez offered "in-person" vs "virtual" encounter. She indicated preferring virtual for this encounter.   Reason COVID-19*  Social distancing based on CDC and AMA recommendations.   I contacted Zoe Sanchez on 01/20/2020 via telephone.      I clearly identified myself as Gaspar Cola, MD. I verified that I was speaking with the correct person using two identifiers (Name: Zoe Sanchez, and date of birth: 02-24-1956).  Consent I sought verbal advanced consent from Zoe Sanchez for virtual visit interactions. I informed Zoe Sanchez of possible security and privacy concerns, risks, and limitations associated with providing "not-in-person" medical evaluation and management services. I also informed Zoe Sanchez of the availability of "in-person" appointments. Finally, I  informed her that there would be a charge for the virtual visit and that she could be  personally, fully or partially, financially responsible for it. Zoe Sanchez expressed understanding and agreed to proceed.   Historic Elements   Zoe Sanchez is a 64 y.o. year old, female patient evaluated today after her last contact with our practice on 12/26/2019. Zoe Sanchez  has a past medical history of Abnormal heart rhythm, Abnormal mammogram (02/25/2014), Anemia, Anxiety, Asthma, Awareness of heartbeats (06/29/2015), Breath shortness (06/29/2015), CAD (coronary artery disease), Cancer ([place]), Cervical radicular pain (Location of Secondary source of pain) (Bilateral) (R>L) (C8 Dermatome) (06/29/2015), Chronic pain, COPD (chronic obstructive pulmonary disease) ([place]), DDD (degenerative disc disease), cervical, Degenerative disk disease, Depression, Dysrhythmia, Endometriosis, Family history of chronic pain (06/29/2015), Fibromyalgia, Fibromyalgia, GERD (gastroesophageal reflux disease), Heart murmur, Hiatal hernia, History of attempted suicide (06/29/2015), History of hiatal hernia (06/29/2015), History of psychiatric disorder (06/29/2015), History of suicidal ideation (06/29/2015), Hyperlipidemia, Insomnia, Insomnia, and Irritable bowel syndrome. She also  has a past surgical history that includes Abdominal hysterectomy (1986); Tonsillectomy (1957); Skin lesion excision; Oophorectomy (1986); Breast surgery; ORIF ankle fracture (Left, 03/14/2018); ORIF ankle fracture (Left, 07/26/2018); Ankle surgery; Colonoscopy with propofol (N/A, 06/05/2019); and Breast biopsy (Right, 2016). Zoe Sanchez has a current medication list which includes the following prescription(s): albuterol, amitriptyline, vitamin d3, cyanocobalamin, [START ON 01/26/2020] cyclobenzaprine, dicyclomine, estradiol, fluticasone, gemfibrozil, [START ON 01/29/2020] hydrocodone-acetaminophen, [START ON 02/28/2020] hydrocodone-acetaminophen, [START ON 03/29/2020]  hydrocodone-acetaminophen, incruse ellipta, melatonin, metronidazole, montelukast, omeprazole, [START ON 01/26/2020] pregabalin, [START ON 01/26/2020] pregabalin, propranolol, propranolol er, salmeterol, zolpidem, and magnesium gluconate. She  reports that she has never smoked. She has never used smokeless tobacco. She reports that she does not drink alcohol or use drugs. Zoe Sanchez is allergic to prednisone.   HPI  Today, she is being  contacted for medication management. The patient indicates doing well with the current medication regimen. No adverse reactions or side effects reported to the medications.  The patient indicates that she is currently sick and she may have some problems coming in for her UDS, but she will do her best.  She also reminded [place] that she does not drive and she needs to make arrangements for that.  Today the patient reminded [place] that we needed to make sure that she had refills on her vitamin B12, vitamin D, magnesium, and her melatonin.  According to our records she should have enough refills to last until 07/29/2020 and most of them.  Today I sent a refill on the magnesium.  Pharmacotherapy Assessment  Analgesic: Hydrocodone/APAP 5/325, 1 tab PO q 4 hrs (30 mg/day of hydrocodone)(1,950 mg/day of acetaminophen) MME/day:30 mg/day.   Monitoring: [place] PMP: PDMP reviewed during this encounter.       Pharmacotherapy: No side-effects or adverse reactions reported. Compliance: No problems identified. Effectiveness: Clinically acceptable. Plan: Refer to "POC".  UDS:  Summary  Date Value Ref Range Status  07/23/2018 FINAL  Final    Comment:    ==================================================================== TOXASSURE SELECT 13 (MW) ==================================================================== Test                             Result       Flag       Units Drug Present and Declared for Prescription Verification   Hydrocodone                    3119         EXPECTED    ng/mg creat   Hydromorphone                  119          EXPECTED   ng/mg creat   Dihydrocodeine                 725          EXPECTED   ng/mg creat   Norhydrocodone                 3244         EXPECTED   ng/mg creat    Sources of hydrocodone include scheduled prescription    medications. Hydromorphone, dihydrocodeine and norhydrocodone are    expected metabolites of hydrocodone. Hydromorphone and    dihydrocodeine are also available as scheduled prescription    medications. ==================================================================== Test                      Result    Flag   Units      Ref Range   Creatinine              63               mg/dL      >=20 ==================================================================== Declared Medications:  The flagging and interpretation on this report are based on the  following declared medications.  Unexpected results may arise from  inaccuracies in the declared medications.  **Note: The testing scope of this panel includes these medications:  Hydrocodone (Hydrocodone-Acetaminophen)  **Note: The testing scope of this panel does not include following  reported medications:  Acetaminophen (Hydrocodone-Acetaminophen)  Amitriptyline (Elavil)  Cyanocobalamin  Cyclobenzaprine  Dicyclomine (Bentyl)  Estradiol (Estrace)  Fluticasone (Flonase)  Gemfibrozil (Lopid)  Magnesium (Magonate)  Melatonin  Metronidazole (Flagyl)  Montelukast (Singulair)  Omeprazole (Prilosec)  Pregabalin (Lyrica)  Propranolol (Inderal)  Salmeterol (Serevent)  Umeclidinium (Incruse Ellipta)  Vitamin D3  Zolpidem (Ambien) ==================================================================== For clinical consultation, please call [phone]. ====================================================================    Laboratory Chemistry Profile   Renal Lab Results  Component Value Date   BUN 21 04/26/2019   CREATININE 0.84 04/26/2019   GFRAA >60  04/26/2019   GFRNONAA >60 04/26/2019     Hepatic Lab Results  Component Value Date   AST 35 04/26/2019   ALT 22 04/26/2019   ALBUMIN 4.1 04/26/2019   ALKPHOS 71 04/26/2019     Electrolytes Lab Results  Component Value Date   NA 135 04/26/2019   K 4.5 04/26/2019   CL 103 04/26/2019   CALCIUM 9.4 04/26/2019   MG 1.8 04/26/2019     Bone Lab Results  Component Value Date   VD25OH 40.8 04/26/2019   25OHVITD1 49 03/09/2016   25OHVITD2 <1.0 03/09/2016   25OHVITD3 48 03/09/2016     Inflammation (CRP: Acute Phase) (ESR: Chronic Phase) Lab Results  Component Value Date   CRP 2.2 (H) 04/26/2019   ESRSEDRATE 39 (H) 04/26/2019       Note: Above Lab results reviewed.  Imaging  MM 3D SCREEN BREAST BILATERAL CLINICAL DATA:  Screening.  EXAM: DIGITAL SCREENING BILATERAL MAMMOGRAM WITH TOMO AND CAD  COMPARISON:  Previous exam(s).  ACR Breast Density Category c: The breast tissue is heterogeneously dense, which may obscure small masses.  FINDINGS: There are no findings suspicious for malignancy. Images were processed with CAD.  IMPRESSION: No mammographic evidence of malignancy. A result letter of this screening mammogram will be mailed directly to the patient.  RECOMMENDATION: Screening mammogram in one year. (Code:[id number]-B-01Y)  BI-RADS CATEGORY  1: Negative.  Electronically Signed   By: Margarette Canada M.D.   On: 06/25/2019 11:04  Assessment  The primary encounter diagnosis was Chronic pain syndrome. Diagnoses of Chronic neck pain (Primary Area of Pain) (Bilateral) (R>L), Chronic upper extremity pain (Secondary area of Pain) (Bilateral) (R>L), Chronic low back pain (Third area of Pain) (Bilateral) (R>L), Pharmacologic therapy, Fibromyalgia, Chronic musculoskeletal pain, Myofascial pain syndrome, Neurogenic pain, Disorder of skeletal system, and Magnesium deficiency were also pertinent to this visit.  Plan of Care  Problem-specific:  No problem-specific Assessment &  Plan notes found for this encounter.  Zoe Sanchez has a current medication list which includes the following long-term medication(s): albuterol, amitriptyline, vitamin d3, cyanocobalamin, [START ON 01/26/2020] cyclobenzaprine, dicyclomine, estradiol, fluticasone, gemfibrozil, [START ON 01/29/2020] hydrocodone-acetaminophen, [START ON 02/28/2020] hydrocodone-acetaminophen, [START ON 03/29/2020] hydrocodone-acetaminophen, melatonin, montelukast, omeprazole, [START ON 01/26/2020] pregabalin, [START ON 01/26/2020] pregabalin, propranolol, propranolol er, salmeterol, and magnesium gluconate.  Pharmacotherapy (Medications Ordered): Meds ordered this encounter  Medications  . HYDROcodone-acetaminophen (NORCO/VICODIN) 5-325 MG tablet    Sig: Take 1 tablet by mouth every 4 (four) hours as needed for severe pain. Must last 30 days    Dispense:  180 tablet    Refill:  0    Chronic Pain: STOP Act (Not applicable) Fill 1 day early if closed on refill date. Do not fill until: 01/29/2020. To last until: 02/28/2020. Avoid benzodiazepines within 8 hours of opioids  . HYDROcodone-acetaminophen (NORCO/VICODIN) 5-325 MG tablet    Sig: Take 1 tablet by mouth every 4 (four) hours as needed for severe pain. Must last 30 days    Dispense:  180 tablet    Refill:  0    Chronic Pain: STOP Act (Not applicable) Fill 1  day early if closed on refill date. Do not fill until: 02/28/2020. To last until: 03/29/2020. Avoid benzodiazepines within 8 hours of opioids  . HYDROcodone-acetaminophen (NORCO/VICODIN) 5-325 MG tablet    Sig: Take 1 tablet by mouth every 4 (four) hours as needed for severe pain. Must last 30 days    Dispense:  180 tablet    Refill:  0    Chronic Pain: STOP Act (Not applicable) Fill 1 day early if closed on refill date. Do not fill until: 03/29/2020. To last until: 04/28/2020. Avoid benzodiazepines within 8 hours of opioids  . cyclobenzaprine (FLEXERIL) 5 MG tablet    Sig: Take 1 tablet (5 mg total) by mouth 3  (three) times daily as needed for muscle spasms.    Dispense:  90 tablet    Refill:  5    Fill one day early if pharmacy is closed on scheduled refill date. May substitute for generic if available.  . pregabalin (LYRICA) 100 MG capsule    Sig: Take 1 capsule (100 mg total) by mouth at bedtime.    Dispense:  30 capsule    Refill:  5    Fill one day early if pharmacy is closed on scheduled refill date. May substitute for generic if available.  . pregabalin (LYRICA) 25 MG capsule    Sig: Take 1 capsule (25 mg total) by mouth at bedtime.    Dispense:  30 capsule    Refill:  5    Fill one day early if pharmacy is closed on scheduled refill date. May substitute for generic if available.  . magnesium gluconate (MAGONATE) 500 MG tablet    Sig: Take 1 tablet (500 mg total) by mouth daily.    Dispense:  30 tablet    Refill:  5    Fill one day early if pharmacy is closed on scheduled refill date. May substitute for generic, or similar, if available.   Orders:  Orders Placed This Encounter  Procedures  . ToxASSURE Select 13 (MW), Urine    Volume: 30 ml(s). Minimum 3 ml of urine is needed. Document temperature of fresh sample. Indications: Long term (current) use of opiate analgesic (P59.163)    Order Specific Question:   Release to patient    Answer:   Immediate   Follow-up plan:   Return in about 14 weeks (around 04/27/2020) for (F2F), (MM).      Interventional management options:  Considering:   Diagnostic bilateral cervical facetblock  Possible bilateral cervical facet RFA  Diagnostic right CESI  Diagnostic bilateral IA shoulder jointinjection  Diagnostic bilateral suprascapular NB  Possible bilateral suprascapular nerve RFA  Possible bilateral lumbar facet RFA  Diagnostic left sided carpal tunnel injection    Palliative PRN treatment(s):   Diagnostic bilateral lumbar facetblock #3 (w/o Steroids)     Recent Visits Date Type Provider Dept  10/31/19 Telemedicine Milinda Pointer, MD [facility]  Showing recent visits within past 90 days and meeting all other requirements   Today's Visits Date Type Provider Dept  01/20/20 Telemedicine Milinda Pointer, MD [facility]  Showing today's visits and meeting all other requirements   Future Appointments No visits were found meeting these conditions.  Showing future appointments within next 90 days and meeting all other requirements   I discussed the assessment and treatment plan with the patient. The patient was provided an opportunity to ask questions and all were answered. The patient agreed with the plan and demonstrated an understanding of the instructions.  Patient advised to call back or seek an in-person evaluation if the symptoms or condition worsens.  Duration of encounter: 12 minutes.  Note by: Gaspar Cola, MD Date: 01/20/2020; Time: 1:52 PM

## 2020-01-20 ENCOUNTER — Ambulatory Visit: Payer: Medicare HMO | Attending: Pain Medicine | Admitting: Pain Medicine

## 2020-01-20 DIAGNOSIS — M797 Fibromyalgia: Secondary | ICD-10-CM

## 2020-01-20 DIAGNOSIS — M5442 Lumbago with sciatica, left side: Secondary | ICD-10-CM | POA: Diagnosis not present

## 2020-01-20 DIAGNOSIS — E612 Magnesium deficiency: Secondary | ICD-10-CM

## 2020-01-20 DIAGNOSIS — M79602 Pain in left arm: Secondary | ICD-10-CM

## 2020-01-20 DIAGNOSIS — G894 Chronic pain syndrome: Secondary | ICD-10-CM | POA: Diagnosis not present

## 2020-01-20 DIAGNOSIS — M5441 Lumbago with sciatica, right side: Secondary | ICD-10-CM

## 2020-01-20 DIAGNOSIS — M79601 Pain in right arm: Secondary | ICD-10-CM | POA: Diagnosis not present

## 2020-01-20 DIAGNOSIS — M7918 Myalgia, other site: Secondary | ICD-10-CM

## 2020-01-20 DIAGNOSIS — Z79899 Other long term (current) drug therapy: Secondary | ICD-10-CM

## 2020-01-20 DIAGNOSIS — G8929 Other chronic pain: Secondary | ICD-10-CM

## 2020-01-20 DIAGNOSIS — M542 Cervicalgia: Secondary | ICD-10-CM

## 2020-01-20 DIAGNOSIS — M899 Disorder of bone, unspecified: Secondary | ICD-10-CM

## 2020-01-20 DIAGNOSIS — M792 Neuralgia and neuritis, unspecified: Secondary | ICD-10-CM

## 2020-01-20 MED ORDER — HYDROCODONE-ACETAMINOPHEN 5-325 MG PO TABS: 1.0000 | ORAL_TABLET | ORAL | 0 refills | Status: DC | PRN

## 2020-01-20 MED ORDER — CYCLOBENZAPRINE HCL 5 MG PO TABS: 5.0000 mg | ORAL_TABLET | Freq: Three times a day (TID) | ORAL | 5 refills | Status: DC | PRN

## 2020-01-20 MED ORDER — PREGABALIN 100 MG PO CAPS: 100.0000 mg | ORAL_CAPSULE | Freq: Every day | ORAL | 5 refills | Status: DC

## 2020-01-20 MED ORDER — PREGABALIN 25 MG PO CAPS: 25.0000 mg | ORAL_CAPSULE | Freq: Every day | ORAL | 5 refills | Status: DC

## 2020-01-20 MED ORDER — MAGNESIUM GLUCONATE 500 MG PO TABS: 500.0000 mg | ORAL_TABLET | Freq: Every day | ORAL | 5 refills | Status: DC

## 2020-01-23 ENCOUNTER — Other Ambulatory Visit
Admission: RE | Admit: 2020-01-23 | Discharge: 2020-01-23 | Disposition: A | Discharge status: Home / Self Care | Payer: Medicare HMO | Attending: Pain Medicine | Admitting: Pain Medicine

## 2020-01-23 ENCOUNTER — Other Ambulatory Visit: Payer: Self-pay

## 2020-01-23 DIAGNOSIS — G894 Chronic pain syndrome: Secondary | ICD-10-CM | POA: Diagnosis present

## 2020-01-29 ENCOUNTER — Other Ambulatory Visit: Payer: Self-pay

## 2020-01-29 ENCOUNTER — Ambulatory Visit (INDEPENDENT_AMBULATORY_CARE_PROVIDER_SITE_OTHER): Payer: Medicare HMO | Admitting: Dermatology

## 2020-01-29 ENCOUNTER — Encounter: Payer: Self-pay | Admitting: Dermatology

## 2020-01-29 DIAGNOSIS — L821 Other seborrheic keratosis: Secondary | ICD-10-CM

## 2020-01-29 DIAGNOSIS — D1800 Hemangioma unspecified site: Secondary | ICD-10-CM | POA: Diagnosis not present

## 2020-01-29 DIAGNOSIS — Z1283 Encounter for screening for malignant neoplasm of skin: Secondary | ICD-10-CM

## 2020-01-29 DIAGNOSIS — L820 Inflamed seborrheic keratosis: Secondary | ICD-10-CM

## 2020-01-29 DIAGNOSIS — L439 Lichen planus, unspecified: Secondary | ICD-10-CM | POA: Diagnosis not present

## 2020-01-29 DIAGNOSIS — Z85828 Personal history of other malignant neoplasm of skin: Secondary | ICD-10-CM

## 2020-01-29 DIAGNOSIS — L814 Other melanin hyperpigmentation: Secondary | ICD-10-CM

## 2020-01-29 DIAGNOSIS — D229 Melanocytic nevi, unspecified: Secondary | ICD-10-CM

## 2020-01-29 DIAGNOSIS — L578 Other skin changes due to chronic exposure to nonionizing radiation: Secondary | ICD-10-CM

## 2020-01-29 LAB — MISC LABCORP TEST (SEND OUT): Labcorp test code: 738526

## 2020-01-29 MED ORDER — METRONIDAZOLE 500 MG PO TABS: 500.0000 mg | ORAL_TABLET | Freq: Two times a day (BID) | ORAL | 6 refills | Status: DC

## 2020-01-29 NOTE — Progress Notes (Signed)
   Follow-Up Visit   Subjective  Zoe Sanchez is a 64 y.o. female who presents for the following: Annual Exam (Hx of BCC and SCC. Patient also has a history of lichen planus that is controlled with Metronidazole 500mg  po BID and OTC CLN ). The patient presents for total-body skin exam for skin cancer screening mole check.  The following portions of the chart were reviewed this encounter and updated as appropriate:  Tobacco  Allergies  Meds  Problems  Med Hx  Surg Hx  Fam Hx     Review of Systems:  No other skin or systemic complaints except as noted in HPI or Assessment and Plan.  Objective  Well appearing patient in no apparent distress; mood and affect are within normal limits.  A full examination was performed including scalp, head, eyes, ears, nose, lips, neck, chest, axillae, abdomen, back, buttocks, bilateral upper extremities, bilateral lower extremities, hands, feet, fingers, toes, fingernails, and toenails. All findings within normal limits unless otherwise noted below.  Objective  R mandible and cheek x 4, L breast x 2, back x 11, B/L leg x 8 (25): Erythematous keratotic or waxy stuck-on papule or plaque.   Objective  B/L leg: Purple papules of the B/L leg  Assessment & Plan  Inflamed seborrheic keratosis (25) R mandible and cheek x 4, L breast x 2, back x 11, B/L leg x 8  Destruction of lesion - R mandible and cheek x 4, L breast x 2, back x 11, B/L leg x 8 Complexity: simple   Destruction method: cryotherapy   Informed consent: discussed and consent obtained   Timeout:  patient name, date of birth, surgical site, and procedure verified Lesion destroyed using liquid nitrogen: Yes   Region frozen until ice ball extended beyond lesion: Yes   Outcome: patient tolerated procedure well with no complications   Post-procedure details: wound care instructions given    Lichen planus, 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 B/L leg  Biopsy  proven of the right shoulder - Controlled with oral Metronidazole 500mg  po bid and OTC CLN  metroNIDAZOLE (FLAGYL) 500 MG tablet - B/L leg   Lentigines - Scattered tan macules - Discussed due to sun exposure - Benign, observe - Call for any changes  Seborrheic Keratoses - Stuck-on, waxy, tan-brown papules and plaques  - Discussed benign etiology and prognosis. - Observe - Call for any changes  Melanocytic Nevi - Tan-brown and/or pink-flesh-colored symmetric macules and papules - Benign appearing on exam today - Observation - Call clinic for new or changing moles - Recommend daily use of broad spectrum spf 30+ sunscreen to sun-exposed areas.   Hemangiomas - Red papules - Discussed benign nature - Observe - Call for any changes  Actinic Damage - diffuse scaly erythematous macules with underlying dyspigmentation - Recommend daily broad spectrum sunscreen SPF 30+ to sun-exposed areas, reapply every 2 hours as needed.  - Call for new or changing lesions.  Skin cancer screening performed today.  Return in about 6 months (around 07/31/2020).  Luther Redo, CMA, am acting as scribe for Sarina Ser, MD . Documentation: I have reviewed the above documentation for accuracy and completeness, and I agree with the above.  Sarina Ser, MD

## 2020-02-02 ENCOUNTER — Encounter: Payer: Self-pay | Admitting: Dermatology

## 2020-02-18 ENCOUNTER — Other Ambulatory Visit: Payer: Self-pay

## 2020-02-19 ENCOUNTER — Telehealth (INDEPENDENT_AMBULATORY_CARE_PROVIDER_SITE_OTHER): Payer: Self-pay | Admitting: Gastroenterology

## 2020-02-19 DIAGNOSIS — Z1211 Encounter for screening for malignant neoplasm of colon: Secondary | ICD-10-CM

## 2020-02-19 NOTE — Progress Notes (Signed)
Patient was contacted to schedule repeat colonoscopy due to poor bowel prep last year.  Colonoscopy instructions were reviewed and has been scheduled for 03/06/23 with Dr. Vicente Males.  Patient has been advised of COVID Test date 03/03/20 at [facility] between 8am-1pm.  Thanks,  [place], [place]

## 2020-02-25 ENCOUNTER — Telehealth: Payer: Self-pay

## 2020-02-25 DIAGNOSIS — Z1211 Encounter for screening for malignant neoplasm of colon: Secondary | ICD-10-CM

## 2020-02-25 MED ORDER — GOLYTELY 236 G PO SOLR: 4000.0000 mL | Freq: Once | ORAL | 0 refills | Status: AC

## 2020-02-25 NOTE — Telephone Encounter (Signed)
Patient LVM for me to call her back in regards to her bowel prep.  She is scheduled for her colonoscopy on 03/05/20 with Dr. Vicente Males. Patient stated that she needed a stronger bowel prep because whatever she had in the past did not work. And she will not be doing this again.  Patient was asked which prep she used in the past-she was unsure.  I advised patient that I will send a new prescription to her pharmacy for Golytely Bowel Prep.  Asked her to begin the bowel prep at 5 pm following prep instructions drink 8 oz every 30 minutes until she completes the entire contents.  In addition to this patient has been advised to do a clear liquid diet on 06/22 and 06/23 and low fiber diet 06/21.  Patient expressed frustration because she thought her procedure was this week and not next week. She went for COVID Test today instead of 6/22.  I apologized for the misunderstanding and explained to her that she is scheduled for 06/24.  Asked if she had received her instructions and she said she has not yet.  I told her that she should be receiving her instructions in the mail any day now.  Asked her to call me back if she does not receive them this week.  Thanks,  [place], [place]

## 2020-02-25 NOTE — Progress Notes (Signed)
Came to get covid test today (per patient, she was having procedure this Thursday, June 17). The test was performed. When the covid team went to enter the paperwork, noted that the patient's procedure was scheduled for 6/24; next Thursday. Patient should have covid test next Tuesday, 6/22. Therefore the covid test would be to early to be processed. The patient was called to be informed and was instructed to call Dr. Vicente Males office to verify when the procedure is scheduled and when she needed covid test. I called Dr. Vicente Males office and left message on the scheduler's answering machine. I will keep the specimen until end of this day waiting for instructions whether to have it processed today or discard it.

## 2020-03-03 ENCOUNTER — Other Ambulatory Visit
Admission: RE | Admit: 2020-03-03 | Discharge: 2020-03-03 | Disposition: A | Discharge status: Home / Self Care | Payer: Medicare HMO | Source: Ambulatory Visit | Attending: Gastroenterology | Admitting: Gastroenterology

## 2020-03-03 ENCOUNTER — Other Ambulatory Visit: Payer: Self-pay

## 2020-03-03 DIAGNOSIS — Z01812 Encounter for preprocedural laboratory examination: Secondary | ICD-10-CM | POA: Diagnosis present

## 2020-03-03 DIAGNOSIS — Z20822 Contact with and (suspected) exposure to covid-19: Secondary | ICD-10-CM | POA: Insufficient documentation

## 2020-03-03 LAB — SARS CORONAVIRUS 2 (TAT 6-24 HRS): SARS Coronavirus 2: NEGATIVE

## 2020-03-04 ENCOUNTER — Encounter: Payer: Self-pay | Admitting: Gastroenterology

## 2020-03-05 ENCOUNTER — Other Ambulatory Visit: Payer: Self-pay

## 2020-03-05 ENCOUNTER — Encounter
Admission: RE | Disposition: A | Discharge status: Home / Self Care | Payer: Self-pay | Source: Home / Self Care | Attending: Gastroenterology

## 2020-03-05 ENCOUNTER — Ambulatory Visit: Payer: Medicare HMO | Admitting: Anesthesiology

## 2020-03-05 ENCOUNTER — Encounter: Payer: Self-pay | Admitting: Gastroenterology

## 2020-03-05 ENCOUNTER — Ambulatory Visit
Admission: RE | Admit: 2020-03-05 | Discharge: 2020-03-05 | Disposition: A | Discharge status: Home / Self Care | Payer: Medicare HMO | Attending: Gastroenterology | Admitting: Gastroenterology

## 2020-03-05 DIAGNOSIS — E538 Deficiency of other specified B group vitamins: Secondary | ICD-10-CM | POA: Diagnosis not present

## 2020-03-05 DIAGNOSIS — Z7951 Long term (current) use of inhaled steroids: Secondary | ICD-10-CM | POA: Insufficient documentation

## 2020-03-05 DIAGNOSIS — Z888 Allergy status to other drugs, medicaments and biological substances status: Secondary | ICD-10-CM | POA: Diagnosis not present

## 2020-03-05 DIAGNOSIS — D649 Anemia, unspecified: Secondary | ICD-10-CM | POA: Diagnosis not present

## 2020-03-05 DIAGNOSIS — E785 Hyperlipidemia, unspecified: Secondary | ICD-10-CM | POA: Diagnosis not present

## 2020-03-05 DIAGNOSIS — F419 Anxiety disorder, unspecified: Secondary | ICD-10-CM | POA: Diagnosis not present

## 2020-03-05 DIAGNOSIS — J45909 Unspecified asthma, uncomplicated: Secondary | ICD-10-CM | POA: Diagnosis not present

## 2020-03-05 DIAGNOSIS — I2510 Atherosclerotic heart disease of native coronary artery without angina pectoris: Secondary | ICD-10-CM | POA: Diagnosis not present

## 2020-03-05 DIAGNOSIS — E559 Vitamin D deficiency, unspecified: Secondary | ICD-10-CM | POA: Insufficient documentation

## 2020-03-05 DIAGNOSIS — M1990 Unspecified osteoarthritis, unspecified site: Secondary | ICD-10-CM | POA: Insufficient documentation

## 2020-03-05 DIAGNOSIS — M5030 Other cervical disc degeneration, unspecified cervical region: Secondary | ICD-10-CM | POA: Diagnosis not present

## 2020-03-05 DIAGNOSIS — Z85828 Personal history of other malignant neoplasm of skin: Secondary | ICD-10-CM | POA: Diagnosis not present

## 2020-03-05 DIAGNOSIS — D120 Benign neoplasm of cecum: Secondary | ICD-10-CM | POA: Insufficient documentation

## 2020-03-05 DIAGNOSIS — K449 Diaphragmatic hernia without obstruction or gangrene: Secondary | ICD-10-CM | POA: Diagnosis not present

## 2020-03-05 DIAGNOSIS — G4700 Insomnia, unspecified: Secondary | ICD-10-CM | POA: Insufficient documentation

## 2020-03-05 DIAGNOSIS — J449 Chronic obstructive pulmonary disease, unspecified: Secondary | ICD-10-CM | POA: Insufficient documentation

## 2020-03-05 DIAGNOSIS — K219 Gastro-esophageal reflux disease without esophagitis: Secondary | ICD-10-CM | POA: Diagnosis not present

## 2020-03-05 DIAGNOSIS — D126 Benign neoplasm of colon, unspecified: Secondary | ICD-10-CM

## 2020-03-05 DIAGNOSIS — Z7952 Long term (current) use of systemic steroids: Secondary | ICD-10-CM | POA: Insufficient documentation

## 2020-03-05 DIAGNOSIS — F329 Major depressive disorder, single episode, unspecified: Secondary | ICD-10-CM | POA: Diagnosis not present

## 2020-03-05 DIAGNOSIS — Z90710 Acquired absence of both cervix and uterus: Secondary | ICD-10-CM | POA: Diagnosis not present

## 2020-03-05 DIAGNOSIS — Z8249 Family history of ischemic heart disease and other diseases of the circulatory system: Secondary | ICD-10-CM | POA: Insufficient documentation

## 2020-03-05 DIAGNOSIS — D122 Benign neoplasm of ascending colon: Secondary | ICD-10-CM | POA: Insufficient documentation

## 2020-03-05 DIAGNOSIS — Z1211 Encounter for screening for malignant neoplasm of colon: Secondary | ICD-10-CM | POA: Diagnosis not present

## 2020-03-05 DIAGNOSIS — Z79899 Other long term (current) drug therapy: Secondary | ICD-10-CM | POA: Insufficient documentation

## 2020-03-05 DIAGNOSIS — M797 Fibromyalgia: Secondary | ICD-10-CM | POA: Insufficient documentation

## 2020-03-05 DIAGNOSIS — Z803 Family history of malignant neoplasm of breast: Secondary | ICD-10-CM | POA: Insufficient documentation

## 2020-03-05 DIAGNOSIS — K589 Irritable bowel syndrome without diarrhea: Secondary | ICD-10-CM | POA: Diagnosis not present

## 2020-03-05 HISTORY — PX: COLONOSCOPY WITH PROPOFOL: SHX5780

## 2020-03-05 SURGERY — COLONOSCOPY WITH PROPOFOL
Anesthesia: General

## 2020-03-05 MED ORDER — PROPOFOL 500 MG/50ML IV EMUL
INTRAVENOUS | Status: AC
  Filled 2020-03-05: qty 50

## 2020-03-05 MED ORDER — PROPOFOL 10 MG/ML IV BOLUS
INTRAVENOUS | Status: AC
  Filled 2020-03-05: qty 20

## 2020-03-05 MED ORDER — PROPOFOL 500 MG/50ML IV EMUL
INTRAVENOUS | Status: DC | PRN
  Administered 2020-03-05: 120 ug/kg/min via INTRAVENOUS

## 2020-03-05 MED ORDER — FENTANYL CITRATE (PF) 100 MCG/2ML IJ SOLN
INTRAMUSCULAR | Status: DC | PRN
  Administered 2020-03-05: 50 ug via INTRAVENOUS

## 2020-03-05 MED ORDER — FENTANYL CITRATE (PF) 100 MCG/2ML IJ SOLN
INTRAMUSCULAR | Status: AC
  Filled 2020-03-05: qty 2

## 2020-03-05 MED ORDER — MIDAZOLAM HCL 2 MG/2ML IJ SOLN
INTRAMUSCULAR | Status: DC | PRN
  Administered 2020-03-05: 2 mg via INTRAVENOUS

## 2020-03-05 MED ORDER — MIDAZOLAM HCL 2 MG/2ML IJ SOLN
INTRAMUSCULAR | Status: AC
  Filled 2020-03-05: qty 2

## 2020-03-05 MED ORDER — SODIUM CHLORIDE 0.9 % IV SOLN: INTRAVENOUS | Status: DC

## 2020-03-05 MED ORDER — ONDANSETRON HCL 4 MG/2ML IJ SOLN
INTRAMUSCULAR | Status: DC | PRN
  Administered 2020-03-05: 4 mg via INTRAVENOUS

## 2020-03-05 NOTE — Anesthesia Preprocedure Evaluation (Signed)
Anesthesia Evaluation  Patient identified by MRN, date of birth, ID band Patient awake    Reviewed: Allergy & Precautions, NPO status , Patient's Chart, lab work & pertinent test results  History of Anesthesia Complications Negative for: history of anesthetic complications  Airway Mallampati: II  TM Distance: >3 FB Neck ROM: limited    Dental  (+) Chipped   Pulmonary neg shortness of breath, asthma , COPD,    breath sounds clear to auscultation- rhonchi       Cardiovascular Exercise Tolerance: Good hypertension, Pt. on home beta blockers + CAD  + dysrhythmias + Valvular Problems/Murmurs  Rhythm:Regular Rate:Normal     Neuro/Psych PSYCHIATRIC DISORDERS Anxiety Depression  Neuromuscular disease    GI/Hepatic Neg liver ROS, hiatal hernia, GERD  Controlled,  Endo/Other  negative endocrine ROS  Renal/GU negative Renal ROS     Musculoskeletal  (+) Arthritis , Fibromyalgia -  Abdominal   Peds  Hematology negative hematology ROS (+)   Anesthesia Other Findings Past Medical History: No date: Abnormal heart rhythm 02/25/2014: Abnormal mammogram No date: Anemia     Comment:  vitamin b12 and vitamin d deficiency No date: Anxiety No date: Asthma 06/29/2015: Awareness of heartbeats 06/29/2015: Breath shortness No date: CAD (coronary artery disease) No date: Cancer [facility])     Comment:  skin cancers 06/29/2015: Cervical radicular pain (Location of Secondary source of  pain) (Bilateral) (R>L) (C8 Dermatome) No date: Chronic pain No date: COPD (chronic obstructive pulmonary disease) (HCC) No date: DDD (degenerative disc disease), cervical No date: Degenerative disk disease No date: Depression No date: Dysrhythmia     Comment:  svt No date: Endometriosis 06/29/2015: Family history of chronic pain No date: Fibromyalgia No date: Fibromyalgia No date: GERD (gastroesophageal reflux disease) No date: Heart murmur No date:  Hiatal hernia 06/29/2015: History of attempted suicide 06/29/2015: History of hiatal hernia 06/29/2015: History of psychiatric disorder 06/29/2015: History of suicidal ideation No date: Hyperlipidemia No date: Insomnia No date: Insomnia No date: Irritable bowel syndrome  Past Surgical History: 1986: ABDOMINAL HYSTERECTOMY No date: ANKLE SURGERY     Comment:  x2 left foot 07/2018 2016: BREAST BIOPSY; Right     Comment:  CORE W/CLIP - EARLY INTRADUCTAL PAPILLOMA No date: BREAST SURGERY 1986: OOPHORECTOMY     Comment:  same time as hysterectomy 03/14/2018: ORIF ANKLE FRACTURE; Left     Comment:  Procedure: OPEN REDUCTION INTERNAL FIXATION (ORIF) ANKLE              FRACTURE;  Surgeon: Lovell Sheehan, MD;  Location: [facility]               ORS;  Service: Orthopedics;  Laterality: Left; 07/26/2018: ORIF ANKLE FRACTURE; Left     Comment:  Procedure: Left fibula osteotomy; revision syndesmosis               open reduction internal fixation; revision medial               malleolus open reduction internal fixation;  Surgeon:               Wylene Simmer, MD;  Location: [place];                Service: Orthopedics;  Laterality: Left; No date: SKIN LESION EXCISION     Comment:  basal cell and squamous cell 1957: TONSILLECTOMY   Reproductive/Obstetrics  Anesthesia Physical  Anesthesia Plan  ASA: III  Anesthesia Plan: General   Post-op Pain Management: GA combined w/ Regional for post-op pain   Induction: Intravenous  PONV Risk Score and Plan: 3 and Treatment may vary due to age or medical condition, Propofol infusion and TIVA  Airway Management Planned: Natural Airway and Nasal Cannula  Additional Equipment:   Intra-op Plan:   Post-operative Plan: Extubation in OR  Informed Consent: I have reviewed the patients History and Physical, chart, labs and discussed the procedure including the risks, benefits and  alternatives for the proposed anesthesia with the patient or authorized representative who has indicated his/her understanding and acceptance.     Dental advisory given  Plan Discussed with: CRNA  Anesthesia Plan Comments: (Patient consented for risks of anesthesia including but not limited to:  - adverse reactions to medications - risk of intubation if required - damage to teeth, lips or other oral mucosa - sore throat or hoarseness - Damage to heart, brain, lungs or loss of life  Patient voiced understanding.)        Anesthesia Quick Evaluation

## 2020-03-05 NOTE — Transfer of Care (Signed)
Immediate Anesthesia Transfer of Care Note  Patient: Zoe Sanchez  Procedure(s) Performed: COLONOSCOPY WITH PROPOFOL (N/A )  Patient Location: PACU  Anesthesia Type:General  Level of Consciousness: awake and sedated  Airway & Oxygen Therapy: Patient Spontanous Breathing and Patient connected to nasal cannula oxygen  Post-op Assessment: Report given to RN and Post -op Vital signs reviewed and stable  Post vital signs: Reviewed and stable  Last Vitals:  Vitals Value Taken Time  BP    Temp    Pulse    Resp    SpO2      Last Pain:  Vitals:   03/05/20 0716  TempSrc: Temporal  PainSc: 0-No pain         Complications: No complications documented.

## 2020-03-05 NOTE — Op Note (Signed)
[facility] Gastroenterology Patient Name: Zoe Sanchez Procedure Date: 03/05/2020 8:15 AM MRN: [id number] Account #: [id number] Date of Birth: 03/08/1956 Admit Type: Outpatient Age: 64 Room: [facility] ENDO ROOM 3 Gender: Female Note Status: Finalized Procedure:             Colonoscopy Indications:           Screening for colorectal malignant neoplasm Providers:             Jonathon Bellows MD, MD Referring MD:          Denton Lank MD, MD (Referring MD) Medicines:             Monitored Anesthesia Care Complications:         No immediate complications. Procedure:             Pre-Anesthesia Assessment:                        - Prior to the procedure, a History and Physical was                         performed, and patient medications, allergies and                         sensitivities were reviewed. The patient's tolerance                         of previous anesthesia was reviewed.                        - The risks and benefits of the procedure and the                         sedation options and risks were discussed with the                         patient. All questions were answered and informed                         consent was obtained.                        - ASA Grade Assessment: II - A patient with mild                         systemic disease.                        After obtaining informed consent, the colonoscope was                         passed under direct vision. Throughout the procedure,                         the patient's blood pressure, pulse, and oxygen                         saturations were monitored continuously. The                         Colonoscope was introduced through the anus  and                         advanced to the the cecum, identified by the                         appendiceal orifice. The colonoscopy was performed                         with moderate difficulty due to poor bowel prep. The                         patient  tolerated the procedure well. The quality of                         the bowel preparation was poor. Findings:      The perianal and digital rectal examinations were normal.      A 13 mm polyp was found in the cecum. The polyp was sessile.       Preparations were made for mucosal resection. Saline was injected to       raise the lesion. Snare mucosal resection was performed. Resection and       retrieval were complete.      A 5 mm polyp was found in the proximal ascending colon. The polyp was       sessile. The polyp was removed with a cold snare. Resection and       retrieval were complete.      The exam was otherwise without abnormality on direct and retroflexion       views. Impression:            - Preparation of the colon was poor.                        - One 13 mm polyp in the cecum, removed with mucosal                         resection. Resected and retrieved.                        - One 5 mm polyp in the proximal ascending colon,                         removed with a cold snare. Resected and retrieved.                        - The examination was otherwise normal on direct and                         retroflexion views.                        - Mucosal resection was performed. Resection and                         retrieval were complete. Recommendation:        - Discharge patient to home (with escort).                        - Resume previous  diet.                        - Continue present medications.                        - Await pathology results.                        - Repeat colonoscopy in 4 months because the bowel                         preparation was suboptimal. Procedure Code(s):     --- Professional ---                        [phone], Colonoscopy, flexible; with endoscopic mucosal                         resection                        45385, 80, Colonoscopy, flexible; with removal of                         tumor(s), polyp(s), or other lesion(s) by snare                          technique Diagnosis Code(s):     --- Professional ---                        Z12.11, Encounter for screening for malignant neoplasm                         of colon                        K63.5, Polyp of colon CPT copyright 2019 American Medical Association. All rights reserved. The codes documented in this report are preliminary and upon coder review may  be revised to meet current compliance requirements. Jonathon Bellows, MD Jonathon Bellows MD, MD 03/05/2020 8:57:02 AM This report has been signed electronically. Number of Addenda: 0 Note Initiated On: 03/05/2020 8:15 AM Scope Withdrawal Time: 0 hours 27 minutes 45 seconds  Total Procedure Duration: 0 hours 34 minutes 12 seconds  Estimated Blood Loss:  Estimated blood loss: none.      [facility]

## 2020-03-05 NOTE — Anesthesia Procedure Notes (Signed)
Performed by: Cook-Martin, Kiandra Sanguinetti Pre-anesthesia Checklist: Patient identified, Emergency Drugs available, Suction available, Patient being monitored and Timeout performed Patient Re-evaluated:Patient Re-evaluated prior to induction Oxygen Delivery Method: Nasal cannula Preoxygenation: Pre-oxygenation with 100% oxygen Induction Type: IV induction Placement Confirmation: positive ETCO2 and CO2 detector       

## 2020-03-05 NOTE — Anesthesia Postprocedure Evaluation (Signed)
Anesthesia Post Note  Patient: Zoe Sanchez  Procedure(s) Performed: COLONOSCOPY WITH PROPOFOL (N/A )  Patient location during evaluation: Endoscopy Anesthesia Type: General Level of consciousness: awake and alert Pain management: pain level controlled Vital Signs Assessment: post-procedure vital signs reviewed and stable Respiratory status: spontaneous breathing, nonlabored ventilation, respiratory function stable and patient connected to nasal cannula oxygen Cardiovascular status: blood pressure returned to baseline and stable Postop Assessment: no apparent nausea or vomiting Anesthetic complications: no   No complications documented.   Last Vitals:  Vitals:   03/05/20 0910 03/05/20 0930  BP: 113/60 116/64  Pulse:    Resp:    Temp:    SpO2:      Last Pain:  Vitals:   03/05/20 0930  TempSrc:   PainSc: 0-No pain                 Precious Haws Nini Cavan

## 2020-03-05 NOTE — H&P (Signed)
Jonathon Bellows, MD [address], [place], [place], [place], [address] [id number], [place], [place], [place], [address] Phone: [phone]  Fax: [phone]  Primary Care Physician:  Denton Lank, MD   Pre-Procedure History & Physical: HPI:  Zoe Sanchez is a 64 y.o. female is here for an colonoscopy.   Past Medical History:  Diagnosis Date  . Abnormal heart rhythm   . Abnormal mammogram 02/25/2014  . Actinic keratosis   . Anemia    vitamin b12 and vitamin d deficiency  . Anxiety   . Asthma   . Awareness of heartbeats 06/29/2015  . Basal cell carcinoma 04/24/2008   R forearm   . Basal cell carcinoma 04/09/2009   R popliteal fossa  . Basal cell carcinoma (BCC) 12/23/2014   R post shoulder  . Breath shortness 06/29/2015  . CAD (coronary artery disease)   . Cancer ([place])    skin cancers  . Cervical radicular pain (Location of Secondary source of pain) (Bilateral) (R>L) (C8 Dermatome) 06/29/2015  . Chronic pain   . COPD (chronic obstructive pulmonary disease) ([place])   . DDD (degenerative disc disease), cervical   . Degenerative disk disease   . Depression   . Dysrhythmia    svt  . Endometriosis   . Family history of chronic pain 06/29/2015  . Fibromyalgia   . Fibromyalgia   . GERD (gastroesophageal reflux disease)   . Heart murmur   . Hiatal hernia   . History of attempted suicide 06/29/2015  . History of hiatal hernia 06/29/2015  . History of psychiatric disorder 06/29/2015  . History of suicidal ideation 06/29/2015  . Hyperlipidemia   . Insomnia   . Insomnia   . Irritable bowel syndrome   . Squamous cell carcinoma of skin 03/08/2007   R breast, R clavicle  . Squamous cell carcinoma of trunk 04/24/2008   L chest  . Squamous cell carcinoma of trunk 05/28/2009   L ant chest (in situ)    Past Surgical History:  Procedure Laterality Date  . ABDOMINAL HYSTERECTOMY  1986  . ANKLE SURGERY     x2 left foot 07/2018  . BREAST BIOPSY Right 2016   CORE  W/CLIP - EARLY INTRADUCTAL PAPILLOMA  . BREAST SURGERY    . COLONOSCOPY WITH PROPOFOL N/A 06/05/2019   Procedure: COLONOSCOPY WITH PROPOFOL;  Surgeon: Jonathon Bellows, MD;  Location: [facility] ENDOSCOPY;  Service: Gastroenterology;  Laterality: N/A;  . FRACTURE SURGERY    . OOPHORECTOMY  1986   same time as hysterectomy  . ORIF ANKLE FRACTURE Left 03/14/2018   Procedure: OPEN REDUCTION INTERNAL FIXATION (ORIF) ANKLE FRACTURE;  Surgeon: Lovell Sheehan, MD;  Location: [facility] ORS;  Service: Orthopedics;  Laterality: Left;  . ORIF ANKLE FRACTURE Left 07/26/2018   Procedure: Left fibula osteotomy; revision syndesmosis open reduction internal fixation; revision medial malleolus open reduction internal fixation;  Surgeon: Wylene Simmer, MD;  Location: [facility];  Service: Orthopedics;  Laterality: Left;  . SKIN LESION EXCISION     basal cell and squamous cell  . TONSILLECTOMY  1957    Prior to Admission medications   Medication Sig Start Date End Date Taking? Authorizing Provider  amitriptyline (ELAVIL) 100 MG tablet Take 200 mg by mouth at bedtime.  02/17/14  Yes [provider]  Cholecalciferol (VITAMIN D3) 50 MCG (2000 UT) capsule Take 1 capsule (2,000 Units total) by mouth daily. 07/30/19 07/29/20 Yes Milinda Pointer, MD  cyanocobalamin ([employer] VITAMIN B12) 2000 MCG tablet  Take 1 tablet (2,000 mcg total) by mouth daily. 07/30/19 07/29/20 Yes Milinda Pointer, MD  cyclobenzaprine (FLEXERIL) 5 MG tablet Take 1 tablet (5 mg total) by mouth 3 (three) times daily as needed for muscle spasms. 01/26/20 07/24/20 Yes Milinda Pointer, MD  dicyclomine (BENTYL) 20 MG tablet Take 20 mg by mouth daily as needed for spasms.  06/06/16  Yes [provider]  estradiol (ESTRACE) 1 MG tablet Take 1 mg by mouth daily.  05/19/16  Yes [provider]  fluticasone (FLONASE) 50 MCG/ACT nasal spray Place 2 sprays into both nostrils daily.  11/14/16  Yes [provider]  gemfibrozil  (LOPID) 600 MG tablet Take 600 mg by mouth 2 (two) times daily before a meal.  02/17/14  Yes [provider]  INCRUSE ELLIPTA 62.5 MCG/INH AEPB Inhale 1 puff into the lungs daily.  02/18/16  Yes [provider]  magnesium gluconate (MAGONATE) 500 MG tablet Take 1 tablet (500 mg total) by mouth daily. 01/20/20 07/18/20 Yes Milinda Pointer, MD  Melatonin 10 MG CAPS Take 20 mg by mouth at bedtime as needed. 07/30/19 07/29/20 Yes Milinda Pointer, MD  metroNIDAZOLE (FLAGYL) 500 MG tablet Take 1 tablet (500 mg total) by mouth 2 (two) times daily. 01/29/20  Yes Ralene Bathe, MD  montelukast (SINGULAIR) 10 MG tablet Take by mouth at bedtime.  02/17/14  Yes [provider]  omeprazole (PRILOSEC) 20 MG capsule Take 20 mg by mouth 2 (two) times daily before a meal.  02/17/14  Yes [provider]  pregabalin (LYRICA) 100 MG capsule Take 1 capsule (100 mg total) by mouth at bedtime. 01/26/20 07/24/20 Yes Milinda Pointer, MD  propranolol (INDERAL) 40 MG tablet TAKE 40 - 280MG  BY MOUTH DAILY AS NEEDED 04/17/17  Yes [provider]  salmeterol (SEREVENT DISKUS) 50 MCG/DOSE diskus inhaler Inhale 1 puff into the lungs daily.    Yes [provider]  zolpidem (AMBIEN) 10 MG tablet Take 10 mg by mouth at bedtime.  03/31/17  Yes [provider]  albuterol (PROAIR HFA) 108 (90 Base) MCG/ACT inhaler ProAir HFA 90 mcg/actuation aerosol inhaler    [provider]  HYDROcodone-acetaminophen (NORCO/VICODIN) 5-325 MG tablet Take 1 tablet by mouth every 4 (four) hours as needed for severe pain. Must last 30 days 01/29/20 02/28/20  Milinda Pointer, MD    Allergies as of 02/19/2020 - Review Complete 02/19/2020  Allergen Reaction Noted  . Prednisone Palpitations 03/09/2016    Family History  Problem Relation Age of Onset  . Heart disease Father   . Hypertension Father   . Hypertension Mother   . Breast cancer Maternal Aunt     Social History    Socioeconomic History  . Marital status: Single    Spouse name: Not on file  . Number of children: Not on file  . Years of education: Not on file  . Highest education level: Not on file  Occupational History  . Not on file  Tobacco Use  . Smoking status: Never Smoker  . Smokeless tobacco: Never Used  Vaping Use  . Vaping Use: Never used  Substance and Sexual Activity  . Alcohol use: No    Alcohol/week: 0.0 standard drinks  . Drug use: No  . Sexual activity: Not Currently    Birth control/protection: Surgical  Other Topics Concern  . Not on file  Social History Narrative  . Not on file   Social Determinants of Health   Financial Resource Strain:   . Difficulty of  Paying Living Expenses:   Food Insecurity:   . Worried About Charity fundraiser in the Last Year:   . Arboriculturist in the Last Year:   Transportation Needs:   . Film/video editor (Medical):   Marland Kitchen Lack of Transportation (Non-Medical):   Physical Activity:   . Days of Exercise per Week:   . Minutes of Exercise per Session:   Stress:   . Feeling of Stress :   Social Connections:   . Frequency of Communication with Friends and Family:   . Frequency of Social Gatherings with Friends and Family:   . Attends Religious Services:   . Active Member of Clubs or Organizations:   . Attends Archivist Meetings:   Marland Kitchen Marital Status:   Intimate Partner Violence:   . Fear of Current or Ex-Partner:   . Emotionally Abused:   Marland Kitchen Physically Abused:   . Sexually Abused:     Review of Systems: See HPI, otherwise negative ROS  Physical Exam: BP 129/63   Pulse 86   Temp 97.8 F (36.6 C) (Temporal)   Resp 18   Ht 5\' 3"  (1.6 m)   Wt 74.5 kg   SpO2 100%   BMI 29.11 kg/m  General:   Alert,  pleasant and cooperative in NAD Head:  Normocephalic and atraumatic. Neck:  Supple; no masses or thyromegaly. Lungs:  Clear throughout to auscultation, normal respiratory effort.    Heart:  +S1, +S2, Regular  rate and rhythm, No edema. Abdomen:  Soft, nontender and nondistended. Normal bowel sounds, without guarding, and without rebound.   Neurologic:  Alert and  oriented x4;  grossly normal neurologically.  Impression/Plan: Zoe Sanchez is here for an colonoscopy to be performed for Screening colonoscopy average risk   Risks, benefits, limitations, and alternatives regarding  colonoscopy have been reviewed with the patient.  Questions have been answered.  All parties agreeable.   Jonathon Bellows, MD  03/05/2020, 8:11 AM

## 2020-03-06 ENCOUNTER — Encounter: Payer: Self-pay | Admitting: Gastroenterology

## 2020-03-06 LAB — SURGICAL PATHOLOGY

## 2020-03-10 ENCOUNTER — Telehealth: Payer: Self-pay

## 2020-03-10 NOTE — Telephone Encounter (Signed)
-----   Message from Jonathon Bellows, MD sent at 03/09/2020  9:22 AM EDT ----- Polyps were pre cancerous - adenomas- poor prep repeat colonoscopy in 4 months

## 2020-03-10 NOTE — Telephone Encounter (Signed)
Patient mother verbalized understanding of results. Did a recall for 4 months for repeat colonoscopy

## 2020-04-22 ENCOUNTER — Encounter: Payer: Self-pay | Admitting: Pain Medicine

## 2020-04-22 ENCOUNTER — Ambulatory Visit: Payer: Medicare Other | Attending: Pain Medicine | Admitting: Pain Medicine

## 2020-04-22 ENCOUNTER — Other Ambulatory Visit: Payer: Self-pay

## 2020-04-22 VITALS — BP 126/61 | HR 74 | Temp 97.3°F | Resp 16 | Ht 62.5 in | Wt 160.0 lb

## 2020-04-22 DIAGNOSIS — G894 Chronic pain syndrome: Secondary | ICD-10-CM

## 2020-04-22 DIAGNOSIS — M5441 Lumbago with sciatica, right side: Secondary | ICD-10-CM

## 2020-04-22 DIAGNOSIS — M542 Cervicalgia: Secondary | ICD-10-CM

## 2020-04-22 DIAGNOSIS — F1120 Opioid dependence, uncomplicated: Secondary | ICD-10-CM | POA: Diagnosis present

## 2020-04-22 DIAGNOSIS — M79601 Pain in right arm: Secondary | ICD-10-CM | POA: Diagnosis present

## 2020-04-22 DIAGNOSIS — M79602 Pain in left arm: Secondary | ICD-10-CM

## 2020-04-22 DIAGNOSIS — M5442 Lumbago with sciatica, left side: Secondary | ICD-10-CM

## 2020-04-22 DIAGNOSIS — G8929 Other chronic pain: Secondary | ICD-10-CM

## 2020-04-22 DIAGNOSIS — Z79899 Other long term (current) drug therapy: Secondary | ICD-10-CM

## 2020-04-22 MED ORDER — HYDROCODONE-ACETAMINOPHEN 5-325 MG PO TABS: 1.0000 | ORAL_TABLET | ORAL | 0 refills | Status: DC | PRN

## 2020-04-22 NOTE — Patient Instructions (Signed)
____________________________________________________________________________________________  Drug Holidays (Slow)  What is a "Drug Holiday"? Drug Holiday: is the name given to the period of time during which a patient stops taking a medication(s) for the purpose of eliminating tolerance to the drug.  Benefits . Improved effectiveness of opioids. . Decreased opioid dose needed to achieve benefits. . Improved pain with lesser dose.  What is tolerance? Tolerance: is the progressive decreased in effectiveness of a drug due to its repetitive use. With repetitive use, the body gets use to the medication and as a consequence, it loses its effectiveness. This is a common problem seen with opioid pain medications. As a result, a larger dose of the drug is needed to achieve the same effect that used to be obtained with a smaller dose.  How long should a "Drug Holiday" last? You should stay off of the pain medicine for at least 14 consecutive days. (2 weeks)  Should I stop the medicine "cold turkey"? No. You should always coordinate with your Pain Specialist so that he/she can provide you with the correct medication dose to make the transition as smoothly as possible.  How do I stop the medicine? Slowly. You will be instructed to decrease the daily amount of pills that you take by one (1) pill every seven (7) days. This is called a "slow downward taper" of your dose. For example: if you normally take four (4) pills per day, you will be asked to drop this dose to three (3) pills per day for seven (7) days, then to two (2) pills per day for seven (7) days, then to one (1) per day for seven (7) days, and at the end of those last seven (7) days, this is when the "Drug Holiday" would start.   Will I have withdrawals? By doing a "slow downward taper" like this one, it is unlikely that you will experience any significant withdrawal symptoms. Typically, what triggers withdrawals is the sudden stop of a high  dose opioid therapy. Withdrawals can usually be avoided by slowly decreasing the dose over a prolonged period of time. If you do not follow these instructions and decide to stop your medication abruptly, withdrawals may be possible.  What are withdrawals? Withdrawals: refers to the wide range of symptoms that occur after stopping or dramatically reducing opiate drugs after heavy and prolonged use. Withdrawal symptoms do not occur to patients that use low dose opioids, or those who take the medication sporadically. Contrary to benzodiazepine (example: Valium, Xanax, etc.) or alcohol withdrawals ("Delirium Tremens"), opioid withdrawals are not lethal. Withdrawals are the physical manifestation of the body getting rid of the excess receptors.  Expected Symptoms Early symptoms of withdrawal may include: . Agitation . Anxiety . Muscle aches . Increased tearing . Insomnia . Runny nose . Sweating . Yawning  Late symptoms of withdrawal may include: . Abdominal cramping . Diarrhea . Dilated pupils . Goose bumps . Nausea . Vomiting  Will I experience withdrawals? Due to the slow nature of the taper, it is very unlikely that you will experience any.  What is a slow taper? Taper: refers to the gradual decrease in dose.  (Last update: 04/01/2020) ____________________________________________________________________________________________    ____________________________________________________________________________________________  Medication Rules  Purpose: To inform patients, and their family members, of our rules and regulations.  Applies to: All patients receiving prescriptions (written or electronic).  Pharmacy of record: Pharmacy where electronic prescriptions will be sent. If written prescriptions are taken to a different pharmacy, please inform the nursing staff. The pharmacy   listed in the electronic medical record should be the one where you would like electronic prescriptions  to be sent.  Electronic prescriptions: In compliance with the [place] Strengthen Opioid Misuse Prevention (STOP) Act of 2017 (Session Law 2017-74/H243), effective September 12, 2018, all controlled substances must be electronically prescribed. Calling prescriptions to the pharmacy will cease to exist.  Prescription refills: Only during scheduled appointments. Applies to all prescriptions.  NOTE: The following applies primarily to controlled substances (Opioid* Pain Medications).   Type of encounter (visit): For patients receiving controlled substances, face-to-face visits are required. (Not an option or up to the patient.)  Patient's responsibilities: 1. Pain Pills: Bring all pain pills to every appointment (except for procedure appointments). 2. Pill Bottles: Bring pills in original pharmacy bottle. Always bring the newest bottle. Bring bottle, even if empty. 3. Medication refills: You are responsible for knowing and keeping track of what medications you take and those you need refilled. The day before your appointment: write a list of all prescriptions that need to be refilled. The day of the appointment: give the list to the admitting nurse. Prescriptions will be written only during appointments. No prescriptions will be written on procedure days. If you forget a medication: it will not be "Called in", "Faxed", or "electronically sent". You will need to get another appointment to get these prescribed. No early refills. Do not call asking to have your prescription filled early. 4. Prescription Accuracy: You are responsible for carefully inspecting your prescriptions before leaving our office. Have the discharge nurse carefully go over each prescription with you, before taking them home. Make sure that your name is accurately spelled, that your address is correct. Check the name and dose of your medication to make sure it is accurate. Check the number of pills, and the written instructions to  make sure they are clear and accurate. Make sure that you are given enough medication to last until your next medication refill appointment. 5. Taking Medication: Take medication as prescribed. When it comes to controlled substances, taking less pills or less frequently than prescribed is permitted and encouraged. Never take more pills than instructed. Never take medication more frequently than prescribed.  6. Inform other Doctors: Always inform, all of your healthcare providers, of all the medications you take. 7. Pain Medication from other Providers: You are not allowed to accept any additional pain medication from any other Doctor or Healthcare provider. There are two exceptions to this rule. (see below) In the event that you require additional pain medication, you are responsible for notifying us, as stated below. 8. Medication Agreement: You are responsible for carefully reading and following our Medication Agreement. This must be signed before receiving any prescriptions from our practice. Safely store a copy of your signed Agreement. Violations to the Agreement will result in no further prescriptions. (Additional copies of our Medication Agreement are available upon request.) 9. Laws, Rules, & Regulations: All patients are expected to follow all Federal and State Laws, Statutes, Rules, & Regulations. Ignorance of the Laws does not constitute a valid excuse.  10. Illegal drugs and Controlled Substances: The use of illegal substances (including, but not limited to marijuana and its derivatives) and/or the illegal use of any controlled substances is strictly prohibited. Violation of this rule may result in the immediate and permanent discontinuation of any and all prescriptions being written by our practice. The use of any illegal substances is prohibited. 11. Adopted CDC guidelines & recommendations: Target dosing levels will be at or   below 60 MME/day. Use of benzodiazepines** is not  recommended.  Exceptions: There are only two exceptions to the rule of not receiving pain medications from other Healthcare Providers. 1. Exception #1 (Emergencies): In the event of an emergency (i.e.: accident requiring emergency care), you are allowed to receive additional pain medication. However, you are responsible for: As soon as you are able, call our office [phone], at any time of the day or night, and leave a message stating your name, the date and nature of the emergency, and the name and dose of the medication prescribed. In the event that your call is answered by a member of our staff, make sure to document and save the date, time, and the name of the person that took your information.  2. Exception #2 (Planned Surgery): In the event that you are scheduled by another doctor or dentist to have any type of surgery or procedure, you are allowed (for a period no longer than 30 days), to receive additional pain medication, for the acute post-op pain. However, in this case, you are responsible for picking up a copy of our "Post-op Pain Management for Surgeons" handout, and giving it to your surgeon or dentist. This document is available at our office, and does not require an appointment to obtain it. Simply go to our office during business hours (Monday-Thursday from 8:00 AM to 4:00 PM) (Friday 8:00 AM to 12:00 Noon) or if you have a scheduled appointment with us, prior to your surgery, and ask for it by name. In addition, you will need to provide us with your name, name of your surgeon, type of surgery, and date of procedure or surgery.  *Opioid medications include: morphine, codeine, oxycodone, oxymorphone, hydrocodone, hydromorphone, meperidine, tramadol, tapentadol, buprenorphine, fentanyl, methadone. **Benzodiazepine medications include: diazepam (Valium), alprazolam (Xanax), clonazepam (Klonopine), lorazepam (Ativan), clorazepate (Tranxene), chlordiazepoxide (Librium), estazolam (Prosom),  oxazepam (Serax), temazepam (Restoril), triazolam (Halcion) (Last updated: 11/09/2017) ____________________________________________________________________________________________   ____________________________________________________________________________________________  Medication Recommendations and Reminders  Applies to: All patients receiving prescriptions (written and/or electronic).  Medication Rules & Regulations: These rules and regulations exist for your safety and that of others. They are not flexible and neither are we. Dismissing or ignoring them will be considered "non-compliance" with medication therapy, resulting in complete and irreversible termination of such therapy. (See document titled "Medication Rules" for more details.) In all conscience, because of safety reasons, we cannot continue providing a therapy where the patient does not follow instructions.  Pharmacy of record:   Definition: This is the pharmacy where your electronic prescriptions will be sent.   We do not endorse any particular pharmacy, however, we have experienced problems with [facility] not securing enough medication supply for the community.  We do not restrict you in your choice of pharmacy. However, once we write for your prescriptions, we will NOT be re-sending more prescriptions to fix restricted supply problems created by your pharmacy, or your insurance.   The pharmacy listed in the electronic medical record should be the one where you want electronic prescriptions to be sent.  If you choose to change pharmacy, simply notify our nursing staff.  Recommendations:  Keep all of your pain medications in a safe place, under lock and key, even if you live alone. We will NOT replace lost, stolen, or damaged medication.  After you fill your prescription, take 1 week's worth of pills and put them away in a safe place. You should keep a separate, properly labeled bottle for this purpose. The remainder    should be kept in the original bottle. Use this as your primary supply, until it runs out. Once it's gone, then you know that you have 1 week's worth of medicine, and it is time to come in for a prescription refill. If you do this correctly, it is unlikely that you will ever run out of medicine.  To make sure that the above recommendation works, it is very important that you make sure your medication refill appointments are scheduled at least 1 week before you run out of medicine. To do this in an effective manner, make sure that you do not leave the office without scheduling your next medication management appointment. Always ask the nursing staff to show you in your prescription , when your medication will be running out. Then arrange for the receptionist to get you a return appointment, at least 7 days before you run out of medicine. Do not wait until you have 1 or 2 pills left, to come in. This is very poor planning and does not take into consideration that we may need to cancel appointments due to bad weather, sickness, or emergencies affecting our staff.  DO NOT ACCEPT A "Partial Fill": If for any reason your pharmacy does not have enough pills/tablets to completely fill or refill your prescription, do not allow for a "partial fill". The law allows the pharmacy to complete that prescription within 72 hours, without requiring a new prescription. If they do not fill the rest of your prescription within those 72 hours, you will need a separate prescription to fill the remaining amount, which we will NOT provide. If the reason for the partial fill is your insurance, you will need to talk to the pharmacist about payment alternatives for the remaining tablets, but again, DO NOT ACCEPT A PARTIAL FILL, unless you can trust your pharmacist to obtain the remainder of the pills within 72 hours.  Prescription refills and/or changes in medication(s):   Prescription refills, and/or changes in dose or medication,  will be conducted only during scheduled medication management appointments. (Applies to both, written and electronic prescriptions.)  No refills on procedure days. No medication will be changed or started on procedure days. No changes, adjustments, and/or refills will be conducted on a procedure day. Doing so will interfere with the diagnostic portion of the procedure.  No phone refills. No medications will be "called into the pharmacy".  No Fax refills.  No weekend refills.  No Holliday refills.  No after hours refills.  Remember:  Business hours are:  Monday to Thursday 8:00 AM to 4:00 PM Provider's Schedule: Chrysta Fulcher, MD - Appointments are:  Medication management: Monday and Wednesday 8:00 AM to 4:00 PM Procedure day: Tuesday and Thursday 7:30 AM to 4:00 PM Bilal Lateef, MD - Appointments are:  Medication management: Tuesday and Thursday 8:00 AM to 4:00 PM Procedure day: Monday and Wednesday 7:30 AM to 4:00 PM (Last update: 04/01/2020) ____________________________________________________________________________________________   ____________________________________________________________________________________________  CBD (cannabidiol) WARNING  Applicable to: All individuals currently taking or considering taking CBD (cannabidiol) and, more important, all patients taking opioid analgesic controlled substances (pain medication). (Example: oxycodone; oxymorphone; hydrocodone; hydromorphone; morphine; methadone; tramadol; tapentadol; fentanyl; buprenorphine; butorphanol; dextromethorphan; meperidine; codeine; etc.)  Legal status: CBD remains a Schedule I drug prohibited for any use. CBD is illegal with one exception. In the [place], CBD has a limited Food and Drug Administration (FDA) approval for the treatment of two specific types of epilepsy disorders. Only one CBD product has been approved by the   FDA for this purpose: "Epidiolex". FDA is aware that some  companies are marketing products containing cannabis and cannabis-derived compounds in ways that violate the Federal Food, Drug and Cosmetic Act (FD&C Act) and that may put the health and safety of consumers at risk. The FDA, a Federal agency, has not enforced the CBD status since 2018.   Legality: Some manufacturers ship CBD products nationally, which is illegal. Often such products are sold online and are therefore available throughout the country. CBD is openly sold in head shops and health food stores in some states where such sales have not been explicitly legalized. Selling unapproved products with unsubstantiated therapeutic claims is not only a violation of the law, but also can put patients at risk, as these products have not been proven to be safe or effective. Federal illegality makes it difficult to conduct research on CBD.  Reference: "FDA Regulation of Cannabis and Cannabis-Derived Products, Including Cannabidiol (CBD)" - https://www.fda.gov/news-events/public-health-focus/fda-regulation-cannabis-and-cannabis-derived-products-including-cannabidiol-cbd  Warning: CBD is not FDA approved and has not undergo the same manufacturing controls as prescription drugs.  This means that the purity and safety of available CBD may be questionable. Most of the time, despite manufacturer's claims, it is contaminated with THC (delta-9-tetrahydrocannabinol - the chemical in marijuana responsible for the "HIGH").  When this is the case, the THC contaminant will trigger a positive urine drug screen (UDS) test for Marijuana (carboxy-THC). Because a positive UDS for any illicit substance is a violation of our medication agreement, your opioid analgesics (pain medicine) may be permanently discontinued.  MORE ABOUT CBD  General Information: CBD  is a derivative of the Marijuana (cannabis sativa) plant discovered in 1940. It is one of the 113 identified substances found in Marijuana. It accounts for up to 40% of the  plant's extract. As of 2018, preliminary clinical studies on CBD included research for the treatment of anxiety, movement disorders, and pain. CBD is available and consumed in multiple forms, including inhalation of smoke or vapor, as an aerosol spray, and by mouth. It may be supplied as an oil containing CBD, capsules, dried cannabis, or as a liquid solution. CBD is thought not to be as psychoactive as THC (delta-9-tetrahydrocannabinol - the chemical in marijuana responsible for the "HIGH"). Studies suggest that CBD may interact with different biological target receptors in the body, including cannabinoid and other neurotransmitter receptors. As of 2018 the mechanism of action for its biological effects has not been determined.  Side-effects  Adverse reactions: Dry mouth, diarrhea, decreased appetite, fatigue, drowsiness, malaise, weakness, sleep disturbances, and others.  Drug interactions: CBC may interact with other medications such as blood-thinners. (Last update: 04/18/2020) ____________________________________________________________________________________________    

## 2020-04-22 NOTE — Progress Notes (Signed)
Nursing Pain Medication Assessment:  Safety precautions to be maintained throughout the outpatient stay will include: orient to surroundings, keep bed in low position, maintain call bell within reach at all times, provide assistance with transfer out of bed and ambulation.  Medication Inspection Compliance: Pill count conducted under aseptic conditions, in front of the patient. Neither the pills nor the bottle was removed from the patient's sight at any time. Once count was completed pills were immediately returned to the patient in their original bottle.  Medication: Hydrocodone/APAP Pill/Patch Count: 116 of 180 pills remain Pill/Patch Appearance: Markings consistent with prescribed medication Bottle Appearance: Standard pharmacy container. Clearly labeled. Filled Date: 7 / 19 / 2021 Last Medication intake:  Today

## 2020-04-22 NOTE — Progress Notes (Signed)
PROVIDER NOTE: Information contained herein reflects review and annotations entered in association with encounter. Interpretation of such information and data should be left to medically-trained personnel. Information provided to patient can be located elsewhere in the medical record under "Patient Instructions". Document created using STT-dictation technology, any transcriptional errors that may result from process are unintentional.    Patient: Zoe Sanchez  Service Category: E/M  Provider: Gaspar Cola, MD  DOB: 01-Nov-1955  DOS: 04/22/2020  Specialty: Interventional Pain Management  MRN: [id number]  Setting: Ambulatory outpatient  PCP: Denton Lank, MD  Type: Established Patient    Referring Provider: Denton Lank, MD  Location: Office  Delivery: Face-to-face     HPI  Reason for encounter: [name], a 64 y.o. year old female, is here today for evaluation and management of her Chronic pain syndrome [G89.4]. [name] primary complain today is Neck Pain Last encounter: Practice (12/26/2019). My last encounter with her was on 12/26/2019. Pertinent problems: [name] has Myofascial pain syndrome; Cervical facet syndrome (Bilateral) (R>L); Chronic neck pain (Primary Area of Pain) (Bilateral) (R>L); Chronic pain syndrome; Cervical spondylosis; Fibromyalgia; Cervical herniated disc (C5-6 and C6-7); Cervical foraminal stenosis (Bilateral) (C5-6); Chronic cervical radicular pain (Bilateral) (R>L) (C8 Dermatome); Chronic low back pain (Third area of Pain) (Bilateral) (R>L); Lumbar spondylosis; Family history of chronic pain; Carpal tunnel syndrome (Left); Chronic shoulder pain (Bilateral); Cervical facet arthropathy; Lumbar facet arthropathy; Bimalleolar fracture of left ankle; Lumbar facet syndrome (Bilateral) (R>L); Chronic upper extremity pain (Secondary area of Pain) (Bilateral) (R>L); L4-5 Lumbar Facet joint Synovial cyst; Chronic lower extremity pain (Bilateral) (R>L); Chronic lumbar  radicular pain (Bilateral) (S1); Arthropathy of lumbar facet joint; Cervical syndrome; Lumbar spondylosis; Synovial cyst of lumbar spine; Prolapsed cervical intervertebral disc; Lumbar facet joint pain; Cervical radiculopathy; Stenosis of intervertebral foramina; Pain in lower limb; Shoulder pain; Chronic musculoskeletal pain; Neurogenic pain; Closed trimalleolar fracture of left ankle; Bilateral lower extremity edema; and Chronic pain of multiple sites on their pertinent problem list. Pain Assessment: Severity of Chronic pain is reported as a 2 /10. Location: Neck Right, Left/up neck to top of head, will sometimes radiated to shoulders bilateral. Onset: More than a month ago. Quality: Aching, Throbbing, Sharp (very sensitive to touch). Timing: Intermittent. Modifying factor(s): medications, rest and close eyes. Vitals:  height is 5' 2.5" (1.588 m) and weight is 160 lb (72.6 kg). Her temperature is 97.3 F (36.3 C) (abnormal). Her blood pressure is 126/61 and her pulse is 74. Her respiration is 16 and oxygen saturation is 100%.    The patient indicates doing well with the current medication regimen. No adverse reactions or side effects reported to the medications.  The patient comes into the clinic today for medication management.  Today we will update her urine drug screen test.  The last one was on 07/23/2018.  The patient refers having had some done recently, but apparently they were done in the hospital and they are not transferring into her note for documentation.  Review of the records indicate that on 01/23/2020 she did have a drug screen test done at the hospital that seem to be compliant.  However, that test was done during the Covid pandemic and we had to announce it.  Today's test will be unannounced.  The patient did question why I needed to repeat the test and I have explained it to her.  She also question as to why she was getting only 1 prescription today and I have also explained to her  what our  monitoring requirements are for opioid analgesics.  I am well aware that she will like for all these visits to be convenient to her, but unfortunately convenience and are mandated requirements will not go hand-in-hand.  Today I am providing the patient with a prescription that should last until 05/29/2020.  She did bring her pills to the clinic today and it would seem that she still has 116 on the left.  This means that she has enough medication to last for at least another 19 days.  This means that she should have enough medicine to last until 05/11/2020.  If she gets today's prescription failed then, she should actually have enough medicine to last until 06/10/2020.  This patient has not taken advantage of any type of interventional therapies says 2018.   When the patient returns to the clinic for follow-up, if her UDS is within normal limits, then I will provide her with enough prescriptions to last for 90 days after which she will need to come in to be seen by Dr. Gillis Santa.  The patient was informed of this plan today.  Pharmacotherapy Assessment   Analgesic: Hydrocodone/APAP 5/325, 1 tab PO q 4 hrs (30 mg/day of hydrocodone)(1,950 mg/day of acetaminophen) MME/day:30 mg/day.   Monitoring: [place] PMP: PDMP reviewed during this encounter.       Pharmacotherapy: No side-effects or adverse reactions reported. Compliance: No problems identified. Effectiveness: Clinically acceptable.  [name], RN  04/22/2020  1:16 PM  Sign when Signing Visit Nursing Pain Medication Assessment:  Safety precautions to be maintained throughout the outpatient stay will include: orient to surroundings, keep bed in low position, maintain call bell within reach at all times, provide assistance with transfer out of bed and ambulation.  Medication Inspection Compliance: Pill count conducted under aseptic conditions, in front of the patient. Neither the pills nor the bottle was removed from the patient's sight at any  time. Once count was completed pills were immediately returned to the patient in their original bottle.  Medication: Hydrocodone/APAP Pill/Patch Count: 116 of 180 pills remain Pill/Patch Appearance: Markings consistent with prescribed medication Bottle Appearance: Standard pharmacy container. Clearly labeled. Filled Date: 7 / 19 / 2021 Last Medication intake:  Today    UDS:  Summary  Date Value Ref Range Status  07/23/2018 FINAL  Final    Comment:    ==================================================================== TOXASSURE SELECT 13 (MW) ==================================================================== Test                             Result       Flag       Units Drug Present and Declared for Prescription Verification   Hydrocodone                    3119         EXPECTED   ng/mg creat   Hydromorphone                  119          EXPECTED   ng/mg creat   Dihydrocodeine                 725          EXPECTED   ng/mg creat   Norhydrocodone                 3244         EXPECTED  ng/mg creat    Sources of hydrocodone include scheduled prescription    medications. Hydromorphone, dihydrocodeine and norhydrocodone are    expected metabolites of hydrocodone. Hydromorphone and    dihydrocodeine are also available as scheduled prescription    medications. ==================================================================== Test                      Result    Flag   Units      Ref Range   Creatinine              63               mg/dL      >=20 ==================================================================== Declared Medications:  The flagging and interpretation on this report are based on the  following declared medications.  Unexpected results may arise from  inaccuracies in the declared medications.  **Note: The testing scope of this panel includes these medications:  Hydrocodone (Hydrocodone-Acetaminophen)  **Note: The testing scope of this panel does not include  following  reported medications:  Acetaminophen (Hydrocodone-Acetaminophen)  Amitriptyline (Elavil)  Cyanocobalamin  Cyclobenzaprine  Dicyclomine (Bentyl)  Estradiol (Estrace)  Fluticasone (Flonase)  Gemfibrozil (Lopid)  Magnesium (Magonate)  Melatonin  Metronidazole (Flagyl)  Montelukast (Singulair)  Omeprazole (Prilosec)  Pregabalin (Lyrica)  Propranolol (Inderal)  Salmeterol (Serevent)  Umeclidinium (Incruse Ellipta)  Vitamin D3  Zolpidem (Ambien) ==================================================================== For clinical consultation, please call [phone]. ====================================================================      ROS  Constitutional: Denies any fever or chills Gastrointestinal: No reported hemesis, hematochezia, vomiting, or acute GI distress Musculoskeletal: Denies any acute onset joint swelling, redness, loss of ROM, or weakness Neurological: No reported episodes of acute onset apraxia, aphasia, dysarthria, agnosia, amnesia, paralysis, loss of coordination, or loss of consciousness  Medication Review  HYDROcodone-acetaminophen, Melatonin, Vitamin D3, albuterol, amitriptyline, cyanocobalamin, cyclobenzaprine, dicyclomine, estradiol, fluticasone, gemfibrozil, magnesium gluconate, metroNIDAZOLE, montelukast, omeprazole, pregabalin, propranolol, salmeterol, umeclidinium bromide, and zolpidem  History Review  Allergy: Ms. Privitera is allergic to prednisone. Drug: Ms. Porta  reports no history of drug use. Alcohol:  reports no history of alcohol use. Tobacco:  reports that she has never smoked. She has never used smokeless tobacco. Social: Ms. Firman  reports that she has never smoked. She has never used smokeless tobacco. She reports that she does not drink alcohol and does not use drugs. Medical:  has a past medical history of Abnormal heart rhythm, Abnormal mammogram (02/25/2014), Actinic keratosis, Anemia, Anxiety, Asthma, Awareness of  heartbeats (06/29/2015), Basal cell carcinoma (04/24/2008), Basal cell carcinoma (04/09/2009), Basal cell carcinoma (BCC) (12/23/2014), Breath shortness (06/29/2015), CAD (coronary artery disease), Cancer (HCC), Cervical radicular pain (Location of Secondary source of pain) (Bilateral) (R>L) (C8 Dermatome) (06/29/2015), Chronic pain, COPD (chronic obstructive pulmonary disease) ([place]), DDD (degenerative disc disease), cervical, Degenerative disk disease, Depression, Dysrhythmia, Endometriosis, Family history of chronic pain (06/29/2015), Fibromyalgia, Fibromyalgia, GERD (gastroesophageal reflux disease), Heart murmur, Hiatal hernia, History of attempted suicide (06/29/2015), History of hiatal hernia (06/29/2015), History of psychiatric disorder (06/29/2015), History of suicidal ideation (06/29/2015), Hyperlipidemia, Insomnia, Insomnia, Irritable bowel syndrome, Squamous cell carcinoma of skin (03/08/2007), 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 Surgical: Ms. Northrup  has a past surgical history that includes Abdominal hysterectomy (1986); Tonsillectomy (1957); Skin lesion excision; Oophorectomy (1986); Breast surgery; ORIF ankle fracture (Left, 03/14/2018); ORIF ankle fracture (Left, 07/26/2018); Ankle surgery; Colonoscopy with propofol (N/A, 06/05/2019); Breast biopsy (Right, 2016); Fracture surgery; and Colonoscopy with propofol (N/A, 03/05/2020). Family: family history includes Breast cancer in her  maternal aunt; Heart disease in her father; Hypertension in her father and mother.  Laboratory Chemistry Profile   Renal Lab Results  Component Value Date   BUN 21 04/26/2019   CREATININE 0.84 04/26/2019   GFRAA >60 04/26/2019   GFRNONAA >60 04/26/2019     Hepatic Lab Results  Component Value Date   AST 35 04/26/2019   ALT 22 04/26/2019   ALBUMIN 4.1 04/26/2019   ALKPHOS 71 04/26/2019     Electrolytes Lab Results  Component Value Date   NA  135 04/26/2019   K 4.5 04/26/2019   CL 103 04/26/2019   CALCIUM 9.4 04/26/2019   MG 1.8 04/26/2019     Bone Lab Results  Component Value Date   VD25OH 40.8 04/26/2019   25OHVITD1 49 03/09/2016   25OHVITD2 <1.0 03/09/2016   25OHVITD3 48 03/09/2016     Inflammation (CRP: Acute Phase) (ESR: Chronic Phase) Lab Results  Component Value Date   CRP 2.2 (H) 04/26/2019   ESRSEDRATE 39 (H) 04/26/2019       Note: Above Lab results reviewed.  Recent Imaging Review  MM 3D SCREEN BREAST BILATERAL CLINICAL DATA:  Screening.  EXAM: DIGITAL SCREENING BILATERAL MAMMOGRAM WITH TOMO AND CAD  COMPARISON:  Previous exam(s).  ACR Breast Density Category c: The breast tissue is heterogeneously dense, which may obscure small masses.  FINDINGS: There are no findings suspicious for malignancy. Images were processed with CAD.  IMPRESSION: No mammographic evidence of malignancy. A result letter of this screening mammogram will be mailed directly to the patient.  RECOMMENDATION: Screening mammogram in one year. (Code:SM-B-01Y)  BI-RADS CATEGORY  1: Negative.  Electronically Signed   By: Margarette Canada M.D.   On: 06/25/2019 11:04 Note: Reviewed        Physical Exam  General appearance: Well nourished, well developed, and well hydrated. In no apparent acute distress Mental status: Alert, oriented x 3 (person, place, & time)       Respiratory: No evidence of acute respiratory distress Eyes: PERLA Vitals: BP 126/61   Pulse 74   Temp (!) 97.3 F (36.3 C)   Resp 16   Ht 5' 2.5" (1.588 m)   Wt 160 lb (72.6 kg)   SpO2 100%   BMI 28.80 kg/m  BMI: Estimated body mass index is 28.8 kg/m as calculated from the following:   Height as of this encounter: 5' 2.5" (1.588 m).   Weight as of this encounter: 160 lb (72.6 kg). Ideal: Ideal body weight: 51.3 kg (112 lb 15.8 oz) Adjusted ideal body weight: 59.8 kg (131 lb 12.7 oz)  Assessment   Status Diagnosis   Controlled Controlled Controlled 1. Chronic pain syndrome   2. Chronic neck pain (Primary Area of Pain) (Bilateral) (R>L)   3. Chronic upper extremity pain (Secondary area of Pain) (Bilateral) (R>L)   4. Chronic low back pain (Third area of Pain) (Bilateral) (R>L)   5. Pharmacologic therapy   6. Uncomplicated opioid dependence ([place])      Updated Problems: Problem  Uncomplicated Opioid Dependence (Hcc)    Plan of Care  Problem-specific:  No problem-specific Assessment & Plan notes found for this encounter.  Ms. ADYN HOES has a current medication list which includes the following long-term medication(s): albuterol, amitriptyline, vitamin d3, cyanocobalamin, cyclobenzaprine, dicyclomine, estradiol, fluticasone, gemfibrozil, [START ON 04/29/2020] hydrocodone-acetaminophen, magnesium gluconate, melatonin, montelukast, omeprazole, pregabalin, propranolol, and salmeterol.  Pharmacotherapy (Medications Ordered): Meds ordered this encounter  Medications  . HYDROcodone-acetaminophen (NORCO/VICODIN) 5-325 MG tablet  Sig: Take 1 tablet by mouth every 4 (four) hours as needed for severe pain. Must last 30 days    Dispense:  180 tablet    Refill:  0    Chronic Pain: STOP Act (Not applicable) Fill 1 day early if closed on refill date. Do not fill until: 04/29/2020. To last until: 05/29/2020. Avoid benzodiazepines within 8 hours of opioids   Orders:  Orders Placed This Encounter  Procedures  . ToxASSURE Select 13 (MW), Urine    Volume: 30 ml(s). Minimum 3 ml of urine is needed. Document temperature of fresh sample. Indications: Long term (current) use of opiate analgesic (A63.016)    Order Specific Question:   Release to patient    Answer:   Immediate   Follow-up plan:   Return in about 5 weeks (around 05/27/2020) for (20-min), (F2F), (Med Mgmt), on E/M day to review UDS.      Interventional management options:  Considering:   Diagnostic bilateral cervical facetblock  Possible  bilateral cervical facet RFA  Diagnostic right CESI  Diagnostic bilateral IA shoulder jointinjection  Diagnostic bilateral suprascapular NB  Possible bilateral suprascapular nerve RFA  Possible bilateral lumbar facet RFA  Diagnostic left sided carpal tunnel injection    Palliative PRN treatment(s):   Diagnostic bilateral lumbar facetblock #3 (w/o Steroids)      Recent Visits No visits were found meeting these conditions. Showing recent visits within past 90 days and meeting all other requirements Today's Visits Date Type Provider Dept  04/22/20 Office Visit Milinda Pointer, MD [facility]  Showing today's visits and meeting all other requirements Future Appointments Date Type Provider Dept  05/27/20 Appointment Milinda Pointer, MD [facility]  Showing future appointments within next 90 days and meeting all other requirements  I discussed the assessment and treatment plan with the patient. The patient was provided an opportunity to ask questions and all were answered. The patient agreed with the plan and demonstrated an understanding of the instructions.  Patient advised to call back or seek an in-person evaluation if the symptoms or condition worsens.  Duration of encounter: 30 minutes.  Note by: Gaspar Cola, MD Date: 04/22/2020; Time: 1:57 PM

## 2020-04-24 LAB — TOXASSURE SELECT 13 (MW), URINE

## 2020-05-21 ENCOUNTER — Encounter: Payer: Medicare Other | Admitting: Pain Medicine

## 2020-05-26 NOTE — Progress Notes (Signed)
PROVIDER NOTE: Information contained herein reflects review and annotations entered in association with encounter. Interpretation of such information and data should be left to medically-trained personnel. Information provided to patient can be located elsewhere in the medical record under "Patient Instructions". Document created using STT-dictation technology, any transcriptional errors that may result from process are unintentional.    Patient: Zoe Sanchez  Service Category: E/M  Provider: Gaspar Cola, MD  DOB: July 29, 1956  DOS: 05/27/2020  Specialty: Interventional Pain Management  MRN: [id number]  Setting: Ambulatory outpatient  PCP: Denton Lank, MD  Type: Established Patient    Referring Provider: Denton Lank, MD  Location: Office  Delivery: Face-to-face     HPI  Reason for encounter: Ms. KEVONA LUPINACCI, a 64 y.o. year old female, is here today for evaluation and management of her Chronic pain syndrome [G89.4]. Ms. Trager primary complain today is Back Pain (lower) and Neck Pain Last encounter: Practice (04/22/2020). My last encounter with her was on 04/22/2020. Pertinent problems: Ms. Fickling has Myofascial pain syndrome; Cervical facet syndrome (Bilateral) (R>L); Chronic neck pain (Primary Area of Pain) (Bilateral) (R>L); Chronic pain syndrome; Cervical spondylosis; Fibromyalgia; Cervical herniated disc (C5-6 and C6-7); Cervical foraminal stenosis (Bilateral) (C5-6); Chronic cervical radicular pain (Bilateral) (R>L) (C8 Dermatome); Chronic low back pain (Third area of Pain) (Bilateral) (R>L); Lumbar spondylosis; Family history of chronic pain; Carpal tunnel syndrome (Left); Chronic shoulder pain (Bilateral); Cervical facet arthropathy; Lumbar facet arthropathy; Bimalleolar fracture of left ankle; Lumbar facet syndrome (Bilateral) (R>L); Chronic upper extremity pain (Secondary area of Pain) (Bilateral) (R>L); L4-5 Lumbar Facet joint Synovial cyst; Chronic lower extremity pain (Bilateral)  (R>L); Chronic lumbar radicular pain (Bilateral) (S1); Arthropathy of lumbar facet joint; Cervical syndrome; Lumbar spondylosis; Synovial cyst of lumbar spine; Prolapsed cervical intervertebral disc; Lumbar facet joint pain; Cervical radiculopathy; Stenosis of intervertebral foramina; Pain in lower limb; Shoulder pain; Chronic musculoskeletal pain; Neurogenic pain; Closed trimalleolar fracture of left ankle; Bilateral lower extremity edema; and Chronic pain of multiple sites on their pertinent problem list. Pain Assessment: Severity of Chronic pain is reported as a 2 /10. Location: Back Lower/back of both legs into soles of feet. Onset: More than a month ago. Quality: Sharp, Stabbing. Timing: Constant. Modifying factor(s): medication, heat. Vitals:  height is '5\' 3"'  (1.6 m) and weight is 160 lb (72.6 kg). Her temporal temperature is 97.8 F (36.6 C). Her blood pressure is 139/73 and her pulse is 84. Her respiration is 17 and oxygen saturation is 100%.   The patient comes into the clinics today for follow-up evaluation and medication management.  She is PMP and UDS compliant. This patient has been in my practice since before 06/29/2015 and since then she has had 2 interventional therapies consisting of 2 diagnostic bilateral lumbar facet blocks under fluoroscopic guidance and IV sedation without steroids.  She never followed up with radiofrequency ablation.  The patient indicates doing well with the current medication regimen. No adverse reactions or side effects reported to the medications.  Today I have provided the patient with enough medication prescriptions to last until 08/27/2020.  Transfer: Magnesium gluconate (Magonate) 500 mg tablet, 1 tablet p.o. daily (30/month) (10/16/2020); cyclobenzaprine (Flexeril) 5 mg tablet, 1 tablet p.o. 3 times daily (90/month) (10/22/2020); pregabalin (Lyrica) 100 mg capsule, 1 capsule p.o. at bedtime (30/month) (10/22/2020); melatonin 10 mg capsule, 2 capsules p.o. at bedtime  (60/month) (10/22/2020); cyanocobalamin ([facility] vitamin B-12) 2000 mcg tablet, 1 tablet p.o. daily (30/month) (10/22/2020); cholecalciferol (vitamin D3) 50 mcg (2000 IU) capsule, 1  capsule p.o. daily in a.m. (60/month) (10/22/2020)  Pharmacotherapy Assessment   Analgesic: Hydrocodone/APAP 5/325, 1 tab PO q 4 hrs (30 mg/day of hydrocodone)(1,950 mg/day of acetaminophen) MME/day:30 mg/day.   Monitoring: [place] PMP: PDMP reviewed during this encounter.       Pharmacotherapy: No side-effects or adverse reactions reported. Compliance: No problems identified. Effectiveness: Clinically acceptable.  Chauncey Fischer, RN  05/27/2020  2:48 PM  Sign when Signing Visit Safety precautions to be maintained throughout the outpatient stay will include: orient to surroundings, keep bed in low position, maintain call bell within reach at all times, provide assistance with transfer out of bed and aNursing Pain Medication Assessment:  Safety precautions to be maintained throughout the outpatient stay will include: orient to surroundings, keep bed in low position, maintain call bell within reach at all times, provide assistance with transfer out of bed and ambulation.  Medication Inspection Compliance: Pill count conducted under aseptic conditions, in front of the patient. Neither the pills nor the bottle was removed from the patient's sight at any time. Once count was completed pills were immediately returned to the patient in their original bottle.  Medication: Hydrocodone/APAP Pill/Patch Count: 62 of 180 pills remain Pill/Patch Appearance: Markings consistent with prescribed medication Bottle Appearance: Standard pharmacy container. Clearly labeled. Filled Date: 8 / 60 / 21 Last Medication intake:  Todaymbulation.     UDS:  Summary  Date Value Ref Range Status  04/22/2020 Note  Final    Comment:    ==================================================================== [employer]  (MW) ==================================================================== Test                             Result       Flag       Units  Drug Present and Declared for Prescription Verification   Hydrocodone                    5091         EXPECTED   ng/mg creat   Hydromorphone                  346          EXPECTED   ng/mg creat   Dihydrocodeine                 1514         EXPECTED   ng/mg creat   Norhydrocodone                 5204         EXPECTED   ng/mg creat    Sources of hydrocodone include scheduled prescription medications.    Hydromorphone, dihydrocodeine and norhydrocodone are expected    metabolites of hydrocodone. Hydromorphone and dihydrocodeine are    also available as scheduled prescription medications.  ==================================================================== Test                      Result    Flag   Units      Ref Range   Creatinine              57               mg/dL      >=20 ==================================================================== Declared Medications:  The flagging and interpretation on this report are based on the  following declared medications.  Unexpected results may arise from  inaccuracies in the declared medications.   **  Note: The testing scope of this panel includes these medications:   Hydrocodone (Norco)   **Note: The testing scope of this panel does not include the  following reported medications:   Acetaminophen (Norco)  Albuterol (Ventolin HFA)  Amitriptyline (Elavil)  Cyanocobalamin  Cyclobenzaprine (Flexeril)  Dicyclomine (Bentyl)  Estradiol (Estrace)  Fluticasone (Flonase)  Gemfibrozil (Lopid)  Magnesium (Magonate)  Melatonin  Metronidazole (Flagyl)  Montelukast (Singulair)  Omeprazole (Prilosec)  Pregabalin (Lyrica)  Propranolol (Inderal)  Salmeterol  Umeclidinium (Incruse Ellipta)  Vitamin D3  Zolpidem (Ambien) ==================================================================== For clinical  consultation, please call [phone]. ====================================================================      ROS  Constitutional: Denies any fever or chills Gastrointestinal: No reported hemesis, hematochezia, vomiting, or acute GI distress Musculoskeletal: Denies any acute onset joint swelling, redness, loss of ROM, or weakness Neurological: No reported episodes of acute onset apraxia, aphasia, dysarthria, agnosia, amnesia, paralysis, loss of coordination, or loss of consciousness  Medication Review  HYDROcodone-acetaminophen, Melatonin, Vitamin D3, albuterol, amitriptyline, cyanocobalamin, cyclobenzaprine, dicyclomine, estradiol, fluticasone, gemfibrozil, magnesium gluconate, metroNIDAZOLE, montelukast, omeprazole, pregabalin, propranolol, salmeterol, umeclidinium bromide, and zolpidem  History Review  Allergy: Ms. Grothe is allergic to prednisone. Drug: Ms. Tuft  reports no history of drug use. Alcohol:  reports no history of alcohol use. Tobacco:  reports that she has never smoked. She has never used smokeless tobacco. Social: Ms. Will  reports that she has never smoked. She has never used smokeless tobacco. She reports that she does not drink alcohol and does not use drugs. Medical:  has a past medical history of Abnormal heart rhythm, Abnormal mammogram (02/25/2014), Actinic keratosis, Anemia, Anxiety, Asthma, Awareness of heartbeats (06/29/2015), Basal cell carcinoma (04/24/2008), Basal cell carcinoma (04/09/2009), Basal cell carcinoma (BCC) (12/23/2014), Breath shortness (06/29/2015), CAD (coronary artery disease), Cancer (HCC), Cervical radicular pain (Location of Secondary source of pain) (Bilateral) (R>L) (C8 Dermatome) (06/29/2015), Chronic pain, COPD (chronic obstructive pulmonary disease) ([place]), DDD (degenerative disc disease), cervical, Degenerative disk disease, Depression, Dysrhythmia, Endometriosis, Family history of chronic pain (06/29/2015), Fibromyalgia,  Fibromyalgia, GERD (gastroesophageal reflux disease), Heart murmur, Hiatal hernia, History of attempted suicide (06/29/2015), History of hiatal hernia (06/29/2015), History of psychiatric disorder (06/29/2015), History of suicidal ideation (06/29/2015), Hyperlipidemia, Insomnia, Insomnia, Irritable bowel syndrome, Squamous cell carcinoma of skin (03/08/2007), 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 Surgical: Ms. Chern  has a past surgical history that includes Abdominal hysterectomy (1986); Tonsillectomy (1957); Skin lesion excision; Oophorectomy (1986); Breast surgery; ORIF ankle fracture (Left, 03/14/2018); ORIF ankle fracture (Left, 07/26/2018); Ankle surgery; Colonoscopy with propofol (N/A, 06/05/2019); Breast biopsy (Right, 2016); Fracture surgery; and Colonoscopy with propofol (N/A, 03/05/2020). Family: family history includes Breast cancer in her maternal aunt; Heart disease in her father; Hypertension in her father and mother.  Laboratory Chemistry Profile   Renal Lab Results  Component Value Date   BUN 21 04/26/2019   CREATININE 0.84 04/26/2019   GFRAA >60 04/26/2019   GFRNONAA >60 04/26/2019     Hepatic Lab Results  Component Value Date   AST 35 04/26/2019   ALT 22 04/26/2019   ALBUMIN 4.1 04/26/2019   ALKPHOS 71 04/26/2019     Electrolytes Lab Results  Component Value Date   NA 135 04/26/2019   K 4.5 04/26/2019   CL 103 04/26/2019   CALCIUM 9.4 04/26/2019   MG 1.8 04/26/2019     Bone Lab Results  Component Value Date   VD25OH 40.8 04/26/2019   25OHVITD1 49 03/09/2016   25OHVITD2 <1.0 03/09/2016   25OHVITD3 48  03/09/2016     Inflammation (CRP: Acute Phase) (ESR: Chronic Phase) Lab Results  Component Value Date   CRP 2.2 (H) 04/26/2019   ESRSEDRATE 39 (H) 04/26/2019       Note: Above Lab results reviewed.  Recent Imaging Review  MM 3D SCREEN BREAST BILATERAL CLINICAL DATA:   Screening.  EXAM: DIGITAL SCREENING BILATERAL MAMMOGRAM WITH TOMO AND CAD  COMPARISON:  Previous exam(s).  ACR Breast Density Category c: The breast tissue is heterogeneously dense, which may obscure small masses.  FINDINGS: There are no findings suspicious for malignancy. Images were processed with CAD.  IMPRESSION: No mammographic evidence of malignancy. A result letter of this screening mammogram will be mailed directly to the patient.  RECOMMENDATION: Screening mammogram in one year. (Code:[id number]-B-01Y)  BI-RADS CATEGORY  1: Negative.  Electronically Signed   By: Margarette Canada M.D.   On: 06/25/2019 11:04 Note: Reviewed        Physical Exam  General appearance: Well nourished, well developed, and well hydrated. In no apparent acute distress Mental status: Alert, oriented x 3 (person, place, & time)       Respiratory: No evidence of acute respiratory distress Eyes: PERLA Vitals: BP 139/73 (BP Location: Right Arm, Patient Position: Sitting, Cuff Size: Normal)   Pulse 84   Temp 97.8 F (36.6 C) (Temporal)   Resp 17   Ht '5\' 3"'  (1.6 m)   Wt 160 lb (72.6 kg)   SpO2 100%   BMI 28.34 kg/m  BMI: Estimated body mass index is 28.34 kg/m as calculated from the following:   Height as of this encounter: '5\' 3"'  (1.6 m).   Weight as of this encounter: 160 lb (72.6 kg). Ideal: Ideal body weight: 52.4 kg (115 lb 8.3 oz) Adjusted ideal body weight: 60.5 kg (133 lb 5 oz)  Assessment   Status Diagnosis  Controlled Controlled Controlled 1. Chronic pain syndrome   2. Fibromyalgia   3. Chronic musculoskeletal pain   4. Myofascial pain syndrome   5. Disorder of skeletal system   6. Magnesium deficiency   7. Neurogenic pain   8. Insomnia secondary to chronic pain   9. Vitamin B12 deficiency   10. Vitamin D insufficiency      Updated Problems: No problems updated.  Plan of Care  Problem-specific:  No problem-specific Assessment & Plan notes found for this encounter.  Ms.  ALIDA GREINER has a current medication list which includes the following long-term medication(s): albuterol, amitriptyline, vitamin d3, cyanocobalamin, cyclobenzaprine, dicyclomine, estradiol, fluticasone, gemfibrozil, magnesium gluconate, [START ON 07/18/2020] magnesium gluconate, melatonin, [START ON 07/24/2020] melatonin, montelukast, omeprazole, pregabalin, propranolol, salmeterol, [START ON 07/24/2020] vitamin d3, [START ON 07/24/2020] cyanocobalamin, [START ON 07/24/2020] cyclobenzaprine, [START ON 05/29/2020] hydrocodone-acetaminophen, [START ON 06/28/2020] hydrocodone-acetaminophen, [START ON 07/28/2020] hydrocodone-acetaminophen, and [START ON 07/24/2020] pregabalin.  Pharmacotherapy (Medications Ordered): Meds ordered this encounter  Medications  . cyanocobalamin ([facility] VITAMIN B12) 2000 MCG tablet    Sig: Take 1 tablet (2,000 mcg total) by mouth daily.    Dispense:  30 tablet    Refill:  2    Fill one day early if pharmacy is closed on scheduled refill date. May substitute for generic if available.  . Cholecalciferol (VITAMIN D3) 50 MCG (2000 UT) capsule    Sig: Take 1 capsule (2,000 Units total) by mouth daily.    Dispense:  30 capsule    Refill:  2    Fill one day early if pharmacy is closed on scheduled refill date. May substitute for generic  if available.  . Melatonin 10 MG CAPS    Sig: Take 20 mg by mouth at bedtime as needed.    Dispense:  60 capsule    Refill:  2    Fill one day early if pharmacy is closed on scheduled refill date. May substitute for generic if available.  . pregabalin (LYRICA) 100 MG capsule    Sig: Take 1 capsule (100 mg total) by mouth at bedtime.    Dispense:  30 capsule    Refill:  2    Fill one day early if pharmacy is closed on scheduled refill date. May substitute for generic if available.  . cyclobenzaprine (FLEXERIL) 5 MG tablet    Sig: Take 1 tablet (5 mg total) by mouth 3 (three) times daily as needed for muscle spasms.    Dispense:  270 tablet     Refill:  0    Fill one day early if pharmacy is closed on scheduled refill date. May substitute for generic if available.  . magnesium gluconate (MAGONATE) 500 MG tablet    Sig: Take 1 tablet (500 mg total) by mouth daily.    Dispense:  30 tablet    Refill:  2    Fill one day early if pharmacy is closed on scheduled refill date. May substitute for generic, or similar, if available.  Marland Kitchen HYDROcodone-acetaminophen (NORCO/VICODIN) 5-325 MG tablet    Sig: Take 1 tablet by mouth every 4 (four) hours as needed for severe pain. Must last 30 days    Dispense:  180 tablet    Refill:  0    Chronic Pain: STOP Act (Not applicable) Fill 1 day early if closed on refill date. Avoid benzodiazepines within 8 hours of opioids  . HYDROcodone-acetaminophen (NORCO/VICODIN) 5-325 MG tablet    Sig: Take 1 tablet by mouth every 4 (four) hours as needed for severe pain. Must last 30 days    Dispense:  180 tablet    Refill:  0    Chronic Pain: STOP Act (Not applicable) Fill 1 day early if closed on refill date. Avoid benzodiazepines within 8 hours of opioids  . HYDROcodone-acetaminophen (NORCO/VICODIN) 5-325 MG tablet    Sig: Take 1 tablet by mouth every 4 (four) hours as needed for severe pain. Must last 30 days    Dispense:  180 tablet    Refill:  0    Chronic Pain: STOP Act (Not applicable) Fill 1 day early if closed on refill date. Avoid benzodiazepines within 8 hours of opioids   Orders:  No orders of the defined types were placed in this encounter.  Follow-up plan:   Return in about 3 months (around 08/27/2020) for (F2F), (Med Mgmt).      Interventional management options:  Considering:   Diagnostic bilateral cervical facetblock  Possible bilateral cervical facet RFA  Diagnostic right CESI  Diagnostic bilateral IA shoulder jointinjection  Diagnostic bilateral suprascapular NB  Possible bilateral suprascapular nerve RFA  Possible bilateral lumbar facet RFA  Diagnostic left sided carpal tunnel  injection    Palliative PRN treatment(s):   Diagnostic bilateral lumbar facetblock #3 (w/o Steroids)       Recent Visits Date Type Provider Dept  04/22/20 Office Visit Milinda Pointer, MD [facility]  Showing recent visits within past 90 days and meeting all other requirements Today's Visits Date Type Provider Dept  05/27/20 Office Visit Milinda Pointer, MD [facility]  Showing today's visits and meeting all other requirements Future Appointments Date Type Provider Dept  08/24/20 Appointment Milinda Pointer, MD [facility]  Showing future appointments within next 90 days and meeting all other requirements  I discussed the assessment and treatment plan with the patient. The patient was provided an opportunity to ask questions and all were answered. The patient agreed with the plan and demonstrated an understanding of the instructions.  Patient advised to call back or seek an in-person evaluation if the symptoms or condition worsens.  Duration of encounter: 30 minutes.  Note by: Gaspar Cola, MD Date: 05/27/2020; Time: 4:53 PM

## 2020-05-27 ENCOUNTER — Ambulatory Visit: Payer: Medicare Other | Attending: Pain Medicine | Admitting: Pain Medicine

## 2020-05-27 ENCOUNTER — Encounter: Payer: Medicare Other | Admitting: Pain Medicine

## 2020-05-27 ENCOUNTER — Encounter: Payer: Self-pay | Admitting: Pain Medicine

## 2020-05-27 ENCOUNTER — Other Ambulatory Visit: Payer: Self-pay

## 2020-05-27 VITALS — BP 139/73 | HR 84 | Temp 97.8°F | Resp 17 | Ht 63.0 in | Wt 160.0 lb

## 2020-05-27 DIAGNOSIS — E612 Magnesium deficiency: Secondary | ICD-10-CM | POA: Insufficient documentation

## 2020-05-27 DIAGNOSIS — M7918 Myalgia, other site: Secondary | ICD-10-CM | POA: Insufficient documentation

## 2020-05-27 DIAGNOSIS — E559 Vitamin D deficiency, unspecified: Secondary | ICD-10-CM | POA: Insufficient documentation

## 2020-05-27 DIAGNOSIS — G894 Chronic pain syndrome: Secondary | ICD-10-CM

## 2020-05-27 DIAGNOSIS — M899 Disorder of bone, unspecified: Secondary | ICD-10-CM | POA: Diagnosis not present

## 2020-05-27 DIAGNOSIS — M792 Neuralgia and neuritis, unspecified: Secondary | ICD-10-CM | POA: Insufficient documentation

## 2020-05-27 DIAGNOSIS — M797 Fibromyalgia: Secondary | ICD-10-CM | POA: Insufficient documentation

## 2020-05-27 DIAGNOSIS — E538 Deficiency of other specified B group vitamins: Secondary | ICD-10-CM | POA: Insufficient documentation

## 2020-05-27 DIAGNOSIS — G8929 Other chronic pain: Secondary | ICD-10-CM | POA: Insufficient documentation

## 2020-05-27 DIAGNOSIS — G4701 Insomnia due to medical condition: Secondary | ICD-10-CM | POA: Insufficient documentation

## 2020-05-27 MED ORDER — HYDROCODONE-ACETAMINOPHEN 5-325 MG PO TABS: 1.0000 | ORAL_TABLET | ORAL | 0 refills | Status: DC | PRN

## 2020-05-27 MED ORDER — PREGABALIN 100 MG PO CAPS: 100.0000 mg | ORAL_CAPSULE | Freq: Every day | ORAL | 2 refills | Status: DC

## 2020-05-27 MED ORDER — CYCLOBENZAPRINE HCL 5 MG PO TABS: 5.0000 mg | ORAL_TABLET | Freq: Three times a day (TID) | ORAL | 0 refills | Status: DC | PRN

## 2020-05-27 MED ORDER — CYANOCOBALAMIN 2000 MCG PO TABS: 2000.0000 ug | ORAL_TABLET | Freq: Every day | ORAL | 2 refills | Status: DC

## 2020-05-27 MED ORDER — MAGNESIUM GLUCONATE 500 MG PO TABS: 500.0000 mg | ORAL_TABLET | Freq: Every day | ORAL | 2 refills | Status: DC

## 2020-05-27 MED ORDER — MELATONIN 10 MG PO CAPS: 20.0000 mg | ORAL_CAPSULE | Freq: Every evening | ORAL | 2 refills | Status: DC | PRN

## 2020-05-27 MED ORDER — VITAMIN D3 50 MCG (2000 UT) PO CAPS: 2000.0000 [IU] | ORAL_CAPSULE | Freq: Every day | ORAL | 2 refills | Status: DC

## 2020-05-27 NOTE — Patient Instructions (Signed)
____________________________________________________________________________________________  Medication Rules  Purpose: To inform patients, and their family members, of our rules and regulations.  Applies to: All patients receiving prescriptions (written or electronic).  Pharmacy of record: Pharmacy where electronic prescriptions will be sent. If written prescriptions are taken to a different pharmacy, please inform the nursing staff. The pharmacy listed in the electronic medical record should be the one where you would like electronic prescriptions to be sent.  Electronic prescriptions: In compliance with the [place] Strengthen Opioid Misuse Prevention (STOP) Act of 2017 (Session Law 2017-74/H243), effective September 12, 2018, all controlled substances must be electronically prescribed. Calling prescriptions to the pharmacy will cease to exist.  Prescription refills: Only during scheduled appointments. Applies to all prescriptions.  NOTE: The following applies primarily to controlled substances (Opioid* Pain Medications).   Type of encounter (visit): For patients receiving controlled substances, face-to-face visits are required. (Not an option or up to the patient.)  Patient's responsibilities: 1. Pain Pills: Bring all pain pills to every appointment (except for procedure appointments). 2. Pill Bottles: Bring pills in original pharmacy bottle. Always bring the newest bottle. Bring bottle, even if empty. 3. Medication refills: You are responsible for knowing and keeping track of what medications you take and those you need refilled. The day before your appointment: write a list of all prescriptions that need to be refilled. The day of the appointment: give the list to the admitting nurse. Prescriptions will be written only during appointments. No prescriptions will be written on procedure days. If you forget a medication: it will not be "Called in", "Faxed", or "electronically sent".  You will need to get another appointment to get these prescribed. No early refills. Do not call asking to have your prescription filled early. 4. Prescription Accuracy: You are responsible for carefully inspecting your prescriptions before leaving our office. Have the discharge nurse carefully go over each prescription with you, before taking them home. Make sure that your name is accurately spelled, that your address is correct. Check the name and dose of your medication to make sure it is accurate. Check the number of pills, and the written instructions to make sure they are clear and accurate. Make sure that you are given enough medication to last until your next medication refill appointment. 5. Taking Medication: Take medication as prescribed. When it comes to controlled substances, taking less pills or less frequently than prescribed is permitted and encouraged. Never take more pills than instructed. Never take medication more frequently than prescribed.  6. Inform other Doctors: Always inform, all of your healthcare providers, of all the medications you take. 7. Pain Medication from other Providers: You are not allowed to accept any additional pain medication from any other Doctor or Healthcare provider. There are two exceptions to this rule. (see below) In the event that you require additional pain medication, you are responsible for notifying us, as stated below. 8. Medication Agreement: You are responsible for carefully reading and following our Medication Agreement. This must be signed before receiving any prescriptions from our practice. Safely store a copy of your signed Agreement. Violations to the Agreement will result in no further prescriptions. (Additional copies of our Medication Agreement are available upon request.) 9. Laws, Rules, & Regulations: All patients are expected to follow all Federal and State Laws, Statutes, Rules, & Regulations. Ignorance of the Laws does not constitute a  valid excuse.  10. Illegal drugs and Controlled Substances: The use of illegal substances (including, but not limited to marijuana and its   derivatives) and/or the illegal use of any controlled substances is strictly prohibited. Violation of this rule may result in the immediate and permanent discontinuation of any and all prescriptions being written by our practice. The use of any illegal substances is prohibited. 11. Adopted CDC guidelines & recommendations: Target dosing levels will be at or below 60 MME/day. Use of benzodiazepines** is not recommended.  Exceptions: There are only two exceptions to the rule of not receiving pain medications from other Healthcare Providers. 1. Exception #1 (Emergencies): In the event of an emergency (i.e.: accident requiring emergency care), you are allowed to receive additional pain medication. However, you are responsible for: As soon as you are able, call our office [phone], at any time of the day or night, and leave a message stating your name, the date and nature of the emergency, and the name and dose of the medication prescribed. In the event that your call is answered by a member of our staff, make sure to document and save the date, time, and the name of the person that took your information.  2. Exception #2 (Planned Surgery): In the event that you are scheduled by another doctor or dentist to have any type of surgery or procedure, you are allowed (for a period no longer than 30 days), to receive additional pain medication, for the acute post-op pain. However, in this case, you are responsible for picking up a copy of our "Post-op Pain Management for Surgeons" handout, and giving it to your surgeon or dentist. This document is available at our office, and does not require an appointment to obtain it. Simply go to our office during business hours (Monday-Thursday from 8:00 AM to 4:00 PM) (Friday 8:00 AM to 12:00 Noon) or if you have a scheduled appointment  with us, prior to your surgery, and ask for it by name. In addition, you are responsible for: calling our office [phone], at any time of the day or night, and leaving a message stating your name, name of your surgeon, type of surgery, and date of procedure or surgery. Failure to comply with your responsibilities may result in termination of therapy involving the controlled substances.  *Opioid medications include: morphine, codeine, oxycodone, oxymorphone, hydrocodone, hydromorphone, meperidine, tramadol, tapentadol, buprenorphine, fentanyl, methadone. **Benzodiazepine medications include: diazepam (Valium), alprazolam (Xanax), clonazepam (Klonopine), lorazepam (Ativan), clorazepate (Tranxene), chlordiazepoxide (Librium), estazolam (Prosom), oxazepam (Serax), temazepam (Restoril), triazolam (Halcion) (Last updated: 05/19/2020) ____________________________________________________________________________________________   ____________________________________________________________________________________________  Medication Recommendations and Reminders  Applies to: All patients receiving prescriptions (written and/or electronic).  Medication Rules & Regulations: These rules and regulations exist for your safety and that of others. They are not flexible and neither are we. Dismissing or ignoring them will be considered "non-compliance" with medication therapy, resulting in complete and irreversible termination of such therapy. (See document titled "Medication Rules" for more details.) In all conscience, because of safety reasons, we cannot continue providing a therapy where the patient does not follow instructions.  Pharmacy of record:   Definition: This is the pharmacy where your electronic prescriptions will be sent.   We do not endorse any particular pharmacy, however, we have experienced problems with [facility] not securing enough medication supply for the community.  We do not  restrict you in your choice of pharmacy. However, once we write for your prescriptions, we will NOT be re-sending more prescriptions to fix restricted supply problems created by your pharmacy, or your insurance.   The pharmacy listed in the electronic medical record should be the   one where you want electronic prescriptions to be sent.  If you choose to change pharmacy, simply notify our nursing staff.  Recommendations:  Keep all of your pain medications in a safe place, under lock and key, even if you live alone. We will NOT replace lost, stolen, or damaged medication.  After you fill your prescription, take 1 week's worth of pills and put them away in a safe place. You should keep a separate, properly labeled bottle for this purpose. The remainder should be kept in the original bottle. Use this as your primary supply, until it runs out. Once it's gone, then you know that you have 1 week's worth of medicine, and it is time to come in for a prescription refill. If you do this correctly, it is unlikely that you will ever run out of medicine.  To make sure that the above recommendation works, it is very important that you make sure your medication refill appointments are scheduled at least 1 week before you run out of medicine. To do this in an effective manner, make sure that you do not leave the office without scheduling your next medication management appointment. Always ask the nursing staff to show you in your prescription , when your medication will be running out. Then arrange for the receptionist to get you a return appointment, at least 7 days before you run out of medicine. Do not wait until you have 1 or 2 pills left, to come in. This is very poor planning and does not take into consideration that we may need to cancel appointments due to bad weather, sickness, or emergencies affecting our staff.  DO NOT ACCEPT A "Partial Fill": If for any reason your pharmacy does not have enough pills/tablets  to completely fill or refill your prescription, do not allow for a "partial fill". The law allows the pharmacy to complete that prescription within 72 hours, without requiring a new prescription. If they do not fill the rest of your prescription within those 72 hours, you will need a separate prescription to fill the remaining amount, which we will NOT provide. If the reason for the partial fill is your insurance, you will need to talk to the pharmacist about payment alternatives for the remaining tablets, but again, DO NOT ACCEPT A PARTIAL FILL, unless you can trust your pharmacist to obtain the remainder of the pills within 72 hours.  Prescription refills and/or changes in medication(s):   Prescription refills, and/or changes in dose or medication, will be conducted only during scheduled medication management appointments. (Applies to both, written and electronic prescriptions.)  No refills on procedure days. No medication will be changed or started on procedure days. No changes, adjustments, and/or refills will be conducted on a procedure day. Doing so will interfere with the diagnostic portion of the procedure.  No phone refills. No medications will be "called into the pharmacy".  No Fax refills.  No weekend refills.  No Holliday refills.  No after hours refills.  Remember:  Business hours are:  Monday to Thursday 8:00 AM to 4:00 PM Provider's Schedule: Sandar Krinke, MD - Appointments are:  Medication management: Monday and Wednesday 8:00 AM to 4:00 PM Procedure day: Tuesday and Thursday 7:30 AM to 4:00 PM Bilal Lateef, MD - Appointments are:  Medication management: Tuesday and Thursday 8:00 AM to 4:00 PM Procedure day: Monday and Wednesday 7:30 AM to 4:00 PM (Last update: 04/01/2020) ____________________________________________________________________________________________  ____________________________________________________________________________________________  CBD  (cannabidiol) WARNING  Applicable to: All individuals currently taking   or considering taking CBD (cannabidiol) and, more important, all patients taking opioid analgesic controlled substances (pain medication). (Example: oxycodone; oxymorphone; hydrocodone; hydromorphone; morphine; methadone; tramadol; tapentadol; fentanyl; buprenorphine; butorphanol; dextromethorphan; meperidine; codeine; etc.)  Legal status: CBD remains a Schedule I drug prohibited for any use. CBD is illegal with one exception. In the [place], CBD has a limited Food and Drug Administration (FDA) approval for the treatment of two specific types of epilepsy disorders. Only one CBD product has been approved by the FDA for this purpose: "Epidiolex". FDA is aware that some companies are marketing products containing cannabis and cannabis-derived compounds in ways that violate the Federal Food, Drug and Cosmetic Act (FD&C Act) and that may put the health and safety of consumers at risk. The FDA, a Federal agency, has not enforced the CBD status since 2018.   Legality: Some manufacturers ship CBD products nationally, which is illegal. Often such products are sold online and are therefore available throughout the country. CBD is openly sold in head shops and health food stores in some states where such sales have not been explicitly legalized. Selling unapproved products with unsubstantiated therapeutic claims is not only a violation of the law, but also can put patients at risk, as these products have not been proven to be safe or effective. Federal illegality makes it difficult to conduct research on CBD.  Reference: "FDA Regulation of Cannabis and Cannabis-Derived Products, Including Cannabidiol (CBD)" - https://www.fda.gov/news-events/public-health-focus/fda-regulation-cannabis-and-cannabis-derived-products-including-cannabidiol-cbd  Warning: CBD is not FDA approved and has not undergo the same manufacturing controls as prescription  drugs.  This means that the purity and safety of available CBD may be questionable. Most of the time, despite manufacturer's claims, it is contaminated with THC (delta-9-tetrahydrocannabinol - the chemical in marijuana responsible for the "HIGH").  When this is the case, the THC contaminant will trigger a positive urine drug screen (UDS) test for Marijuana (carboxy-THC). Because a positive UDS for any illicit substance is a violation of our medication agreement, your opioid analgesics (pain medicine) may be permanently discontinued.  MORE ABOUT CBD  General Information: CBD  is a derivative of the Marijuana (cannabis sativa) plant discovered in 1940. It is one of the 113 identified substances found in Marijuana. It accounts for up to 40% of the plant's extract. As of 2018, preliminary clinical studies on CBD included research for the treatment of anxiety, movement disorders, and pain. CBD is available and consumed in multiple forms, including inhalation of smoke or vapor, as an aerosol spray, and by mouth. It may be supplied as an oil containing CBD, capsules, dried cannabis, or as a liquid solution. CBD is thought not to be as psychoactive as THC (delta-9-tetrahydrocannabinol - the chemical in marijuana responsible for the "HIGH"). Studies suggest that CBD may interact with different biological target receptors in the body, including cannabinoid and other neurotransmitter receptors. As of 2018 the mechanism of action for its biological effects has not been determined.  Side-effects  Adverse reactions: Dry mouth, diarrhea, decreased appetite, fatigue, drowsiness, malaise, weakness, sleep disturbances, and others.  Drug interactions: CBC may interact with other medications such as blood-thinners. (Last update:  04/18/2020) ____________________________________________________________________________________________   ____________________________________________________________________________________________  Drug Holidays (Slow)  What is a "Drug Holiday"? Drug Holiday: is the name given to the period of time during which a patient stops taking a medication(s) for the purpose of eliminating tolerance to the drug.  Benefits . Improved effectiveness of opioids. . Decreased opioid dose needed to achieve benefits. . Improved pain with lesser dose.  What   is tolerance? Tolerance: is the progressive decreased in effectiveness of a drug due to its repetitive use. With repetitive use, the body gets use to the medication and as a consequence, it loses its effectiveness. This is a common problem seen with opioid pain medications. As a result, a larger dose of the drug is needed to achieve the same effect that used to be obtained with a smaller dose.  How long should a "Drug Holiday" last? You should stay off of the pain medicine for at least 14 consecutive days. (2 weeks)  Should I stop the medicine "cold turkey"? No. You should always coordinate with your Pain Specialist so that he/she can provide you with the correct medication dose to make the transition as smoothly as possible.  How do I stop the medicine? Slowly. You will be instructed to decrease the daily amount of pills that you take by one (1) pill every seven (7) days. This is called a "slow downward taper" of your dose. For example: if you normally take four (4) pills per day, you will be asked to drop this dose to three (3) pills per day for seven (7) days, then to two (2) pills per day for seven (7) days, then to one (1) per day for seven (7) days, and at the end of those last seven (7) days, this is when the "Drug Holiday" would start.   Will I have withdrawals? By doing a "slow downward taper" like this one, it is unlikely that you will  experience any significant withdrawal symptoms. Typically, what triggers withdrawals is the sudden stop of a high dose opioid therapy. Withdrawals can usually be avoided by slowly decreasing the dose over a prolonged period of time. If you do not follow these instructions and decide to stop your medication abruptly, withdrawals may be possible.  What are withdrawals? Withdrawals: refers to the wide range of symptoms that occur after stopping or dramatically reducing opiate drugs after heavy and prolonged use. Withdrawal symptoms do not occur to patients that use low dose opioids, or those who take the medication sporadically. Contrary to benzodiazepine (example: Valium, Xanax, etc.) or alcohol withdrawals ("Delirium Tremens"), opioid withdrawals are not lethal. Withdrawals are the physical manifestation of the body getting rid of the excess receptors.  Expected Symptoms Early symptoms of withdrawal may include: . Agitation . Anxiety . Muscle aches . Increased tearing . Insomnia . Runny nose . Sweating . Yawning  Late symptoms of withdrawal may include: . Abdominal cramping . Diarrhea . Dilated pupils . Goose bumps . Nausea . Vomiting  Will I experience withdrawals? Due to the slow nature of the taper, it is very unlikely that you will experience any.  What is a slow taper? Taper: refers to the gradual decrease in dose.  (Last update: 04/01/2020) ____________________________________________________________________________________________      

## 2020-05-27 NOTE — Progress Notes (Signed)
Safety precautions to be maintained throughout the outpatient stay will include: orient to surroundings, keep bed in low position, maintain call bell within reach at all times, provide assistance with transfer out of bed and aNursing Pain Medication Assessment:  Safety precautions to be maintained throughout the outpatient stay will include: orient to surroundings, keep bed in low position, maintain call bell within reach at all times, provide assistance with transfer out of bed and ambulation.  Medication Inspection Compliance: Pill count conducted under aseptic conditions, in front of the patient. Neither the pills nor the bottle was removed from the patient's sight at any time. Once count was completed pills were immediately returned to the patient in their original bottle.  Medication: Hydrocodone/APAP Pill/Patch Count: 62 of 180 pills remain Pill/Patch Appearance: Markings consistent with prescribed medication Bottle Appearance: Standard pharmacy container. Clearly labeled. Filled Date: 8 / 64 / 21 Last Medication intake:  Todaymbulation.

## 2020-07-01 ENCOUNTER — Other Ambulatory Visit: Payer: Self-pay | Admitting: Pain Medicine

## 2020-07-01 DIAGNOSIS — G894 Chronic pain syndrome: Secondary | ICD-10-CM

## 2020-07-30 ENCOUNTER — Encounter: Payer: Medicare HMO | Admitting: Dermatology

## 2020-08-21 ENCOUNTER — Other Ambulatory Visit: Payer: Self-pay | Admitting: Pain Medicine

## 2020-08-23 NOTE — Progress Notes (Signed)
PROVIDER NOTE: Information contained herein reflects review and annotations entered in association with encounter. Interpretation of such information and data should be left to medically-trained personnel. Information provided to patient can be located elsewhere in the medical record under "Patient Instructions". Document created using STT-dictation technology, any transcriptional errors that may result from process are unintentional.    Patient: Zoe Sanchez  Service Category: E/M  Provider: Gaspar Cola, MD  DOB: [birth year]  DOS: 08/24/2020  Specialty: Interventional Pain Management  MRN: [id number]  Setting: Ambulatory outpatient  PCP: Denton Lank, MD  Type: Established Patient    Referring Provider: Denton Lank, MD  Location: Office  Delivery: Face-to-face     HPI  [name], a 64 y.o. year old female, is here today because of her No primary diagnosis found.. [name] primary complain today is Neck Pain (midline) and Back Pain (Lumbar bilateral ) Last encounter: My last encounter with her was on 08/21/2020. Pertinent problems: [name] has Myofascial pain syndrome; Cervical facet syndrome (Bilateral) (R>L); Chronic neck pain (Primary Area of Pain) (Bilateral) (R>L); Chronic pain syndrome; Cervical spondylosis; Fibromyalgia; Cervical herniated disc (C5-6 and C6-7); Cervical foraminal stenosis (Bilateral) (C5-6); Chronic cervical radicular pain (Bilateral) (R>L) (C8 Dermatome); Chronic low back pain (Third area of Pain) (Bilateral) (R>L); Lumbar spondylosis; Family history of chronic pain; Carpal tunnel syndrome (Left); Chronic shoulder pain (Bilateral); Cervical facet arthropathy; Lumbar facet arthropathy; Bimalleolar fracture of left ankle; Lumbar facet syndrome (Bilateral) (R>L); Chronic upper extremity pain (Secondary area of Pain) (Bilateral) (R>L); L4-5 Lumbar Facet joint Synovial cyst; Chronic lower extremity pain (Bilateral) (R>L); Chronic lumbar radicular pain  (Bilateral) (S1); Arthropathy of lumbar facet joint; Cervical syndrome; Lumbar spondylosis; Synovial cyst of lumbar spine; Prolapsed cervical intervertebral disc; Lumbar facet joint pain; Cervical radiculopathy; Stenosis of intervertebral foramina; Pain in lower limb; Shoulder pain; Chronic musculoskeletal pain; Neurogenic pain; Closed trimalleolar fracture of left ankle; Bilateral lower extremity edema; and Chronic pain of multiple sites on their pertinent problem list. Pain Assessment: Severity of Chronic pain is reported as a 1 /10. Location: Neck (back) Mid (lumbar bilateral)/neck pain into shoulders and arms bilaterally.  back pain into hips and legs bilaterally. Onset: More than a month ago. Quality: Discomfort,Constant,Throbbing. Timing: Constant. Modifying factor(s): warm showers, medications, rest. Vitals:  height is '5\' 3"'  (1.6 m) and weight is 155 lb (70.3 kg). Her temporal temperature is 97.2 F (36.2 C) (abnormal). Her blood pressure is 115/59 (abnormal) and her pulse is 74. Her respiration is 16 and oxygen saturation is 98%.   Reason for encounter: medication management.  The patient indicates doing well with the current medication regimen. No adverse reactions or side effects reported to the medications. PMP & UDS compliant.   RTCB: 11/25/2020 Nonopioids transferred 08/24/2020: Magnesium, Flexeril, Lyrica, melatonin, vitamin B12, and vitamin D3.  Pharmacotherapy Assessment   Analgesic: Hydrocodone/APAP 5/325, 1 tab PO q 4 hrs (30 mg/day of hydrocodone)(1,950 mg/day of acetaminophen) MME/day:30 mg/day.   Monitoring: [place] PMP: PDMP reviewed during this encounter.       Pharmacotherapy: No side-effects or adverse reactions reported. Compliance: No problems identified. Effectiveness: Clinically acceptable.  [name], RN  08/24/2020  3:16 PM  Sign when Signing Visit Nursing Pain Medication Assessment:  Safety precautions to be maintained throughout the outpatient stay will  include: orient to surroundings, keep bed in low position, maintain call bell within reach at all times, provide assistance with transfer out of bed and ambulation.  Medication Inspection Compliance: Pill count conducted under  aseptic conditions, in front of the patient. Neither the pills nor the bottle was removed from the patient's sight at any time. Once count was completed pills were immediately returned to the patient in their original bottle.  Medication: Hydrocodone/APAP Pill/Patch Count: 52 of 180 pills remain Pill/Patch Appearance: Markings consistent with prescribed medication Bottle Appearance: Standard pharmacy container. Clearly labeled. Filled Date: 15 / 48 / 2021 Last Medication intake:  Today    UDS:  Summary  Date Value Ref Range Status  04/22/2020 Note  Final    Comment:    ==================================================================== [employer] [facility] 13 (MW) ==================================================================== Test                             Result       Flag       Units  Drug Present and Declared for Prescription Verification   Hydrocodone                    5091         EXPECTED   ng/mg creat   Hydromorphone                  346          EXPECTED   ng/mg creat   Dihydrocodeine                 1514         EXPECTED   ng/mg creat   Norhydrocodone                 5204         EXPECTED   ng/mg creat    Sources of hydrocodone include scheduled prescription medications.    Hydromorphone, dihydrocodeine and norhydrocodone are expected    metabolites of hydrocodone. Hydromorphone and dihydrocodeine are    also available as scheduled prescription medications.  ==================================================================== Test                      Result    Flag   Units      Ref Range   Creatinine              57               mg/dL      >=20 ==================================================================== Declared Medications:  The  flagging and interpretation on this report are based on the  following declared medications.  Unexpected results may arise from  inaccuracies in the declared medications.   **Note: The testing scope of this panel includes these medications:   Hydrocodone (Norco)   **Note: The testing scope of this panel does not include the  following reported medications:   Acetaminophen (Norco)  Albuterol (Ventolin HFA)  Amitriptyline (Elavil)  Cyanocobalamin  Cyclobenzaprine (Flexeril)  Dicyclomine (Bentyl)  Estradiol (Estrace)  Fluticasone (Flonase)  Gemfibrozil (Lopid)  Magnesium (Magonate)  Melatonin  Metronidazole (Flagyl)  Montelukast (Singulair)  Omeprazole (Prilosec)  Pregabalin (Lyrica)  Propranolol (Inderal)  Salmeterol  Umeclidinium (Incruse Ellipta)  Vitamin D3  Zolpidem (Ambien) ==================================================================== For clinical consultation, please call [phone]. ====================================================================      ROS  Constitutional: Denies any fever or chills Gastrointestinal: No reported hemesis, hematochezia, vomiting, or acute GI distress Musculoskeletal: Denies any acute onset joint swelling, redness, loss of ROM, or weakness Neurological: No reported episodes of acute onset apraxia, aphasia, dysarthria, agnosia, amnesia, paralysis, loss of coordination, or loss of consciousness  Medication Review  HYDROcodone-acetaminophen, Melatonin, Vitamin D3, albuterol, amitriptyline, cyanocobalamin, cyclobenzaprine, dicyclomine, estradiol, fluticasone, gemfibrozil, magnesium gluconate, metroNIDAZOLE, montelukast, omeprazole, pregabalin, propranolol, salmeterol, umeclidinium bromide, and zolpidem  History Review  Allergy: Ms. Saks is allergic to prednisone. Drug: Ms. Brierley  reports no history of drug use. Alcohol:  reports no history of alcohol use. Tobacco:  reports that she has never smoked. She has never  used smokeless tobacco. Social: Ms. Arriaga  reports that she has never smoked. She has never used smokeless tobacco. She reports that she does not drink alcohol and does not use drugs. Medical:  has a past medical history of Abnormal heart rhythm, Abnormal mammogram (02/25/2014), Actinic keratosis, Anemia, Anxiety, Asthma, Awareness of heartbeats (06/29/2015), Basal cell carcinoma (04/24/2008), Basal cell carcinoma (04/09/2009), Basal cell carcinoma (BCC) (12/23/2014), Breath shortness (06/29/2015), CAD (coronary artery disease), Cancer (HCC), Cervical radicular pain (Location of Secondary source of pain) (Bilateral) (R>L) (C8 Dermatome) (06/29/2015), Chronic pain, COPD (chronic obstructive pulmonary disease) ([place]), DDD (degenerative disc disease), cervical, Degenerative disk disease, Depression, Dysrhythmia, Endometriosis, Family history of chronic pain (06/29/2015), Fibromyalgia, Fibromyalgia, GERD (gastroesophageal reflux disease), Heart murmur, Hiatal hernia, History of attempted suicide (06/29/2015), History of hiatal hernia (06/29/2015), History of psychiatric disorder (06/29/2015), History of suicidal ideation (06/29/2015), Hyperlipidemia, Insomnia, Insomnia, Irritable bowel syndrome, Squamous cell carcinoma of skin (03/08/2007), 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 Surgical: Ms. Mcgilvray  has a past surgical history that includes Abdominal hysterectomy (1986); Tonsillectomy (1957); Skin lesion excision; Oophorectomy (1986); Breast surgery; ORIF ankle fracture (Left, 03/14/2018); ORIF ankle fracture (Left, 07/26/2018); Ankle surgery; Colonoscopy with propofol (N/A, 06/05/2019); Breast biopsy (Right, 2016); Fracture surgery; and Colonoscopy with propofol (N/A, 03/05/2020). Family: family history includes Breast cancer in her maternal aunt; Heart disease in her father; Hypertension in her father and mother.  Laboratory Chemistry Profile    Renal Lab Results  Component Value Date   BUN 21 04/26/2019   CREATININE 0.84 04/26/2019   GFRAA >60 04/26/2019   GFRNONAA >60 04/26/2019     Hepatic Lab Results  Component Value Date   AST 35 04/26/2019   ALT 22 04/26/2019   ALBUMIN 4.1 04/26/2019   ALKPHOS 71 04/26/2019     Electrolytes Lab Results  Component Value Date   NA 135 04/26/2019   K 4.5 04/26/2019   CL 103 04/26/2019   CALCIUM 9.4 04/26/2019   MG 1.8 04/26/2019     Bone Lab Results  Component Value Date   VD25OH 40.8 04/26/2019   25OHVITD1 49 03/09/2016   25OHVITD2 <1.0 03/09/2016   25OHVITD3 48 03/09/2016     Inflammation (CRP: Acute Phase) (ESR: Chronic Phase) Lab Results  Component Value Date   CRP 2.2 (H) 04/26/2019   ESRSEDRATE 39 (H) 04/26/2019       Note: Above Lab results reviewed.  Recent Imaging Review  MM 3D SCREEN BREAST BILATERAL CLINICAL DATA:  Screening.  EXAM: DIGITAL SCREENING BILATERAL MAMMOGRAM WITH TOMO AND CAD  COMPARISON:  Previous exam(s).  ACR Breast Density Category c: The breast tissue is heterogeneously dense, which may obscure small masses.  FINDINGS: There are no findings suspicious for malignancy. Images were processed with CAD.  IMPRESSION: No mammographic evidence of malignancy. A result letter of this screening mammogram will be mailed directly to the patient.  RECOMMENDATION: Screening mammogram in one year. (Code:SM-B-01Y)  BI-RADS CATEGORY  1: Negative.  Electronically Signed   By: Margarette Canada M.D.   On: 06/25/2019 11:04 Note: Reviewed  Physical Exam  General appearance: Well nourished, well developed, and well hydrated. In no apparent acute distress Mental status: Alert, oriented x 3 (person, place, & time)       Respiratory: No evidence of acute respiratory distress Eyes: PERLA Vitals: BP (!) 115/59 (BP Location: Right Arm, Patient Position: Sitting, Cuff Size: Normal)   Pulse 74   Temp (!) 97.2 F (36.2 C) (Temporal)   Resp  16   Ht '5\' 3"'  (1.6 m)   Wt 155 lb (70.3 kg)   SpO2 98%   BMI 27.46 kg/m  BMI: Estimated body mass index is 27.46 kg/m as calculated from the following:   Height as of this encounter: '5\' 3"'  (1.6 m).   Weight as of this encounter: 155 lb (70.3 kg). Ideal: Ideal body weight: 52.4 kg (115 lb 8.3 oz) Adjusted ideal body weight: 59.6 kg (131 lb 5 oz)  Assessment   Status Diagnosis  Controlled Controlled Controlled 1. Chronic pain syndrome      Updated Problems: No problems updated.  Plan of Care  Problem-specific:  No problem-specific Assessment & Plan notes found for this encounter.  Ms. KATTIA SELLEY has a current medication list which includes the following long-term medication(s): albuterol, amitriptyline, vitamin d3, cyanocobalamin, cyclobenzaprine, dicyclomine, estradiol, fluticasone, gemfibrozil, magnesium gluconate, melatonin, montelukast, omeprazole, pregabalin, propranolol, salmeterol, [START ON 08/27/2020] hydrocodone-acetaminophen, [START ON 09/26/2020] hydrocodone-acetaminophen, and [START ON 10/26/2020] hydrocodone-acetaminophen.  Pharmacotherapy (Medications Ordered): Meds ordered this encounter  Medications  . HYDROcodone-acetaminophen (NORCO/VICODIN) 5-325 MG tablet    Sig: Take 1 tablet by mouth every 4 (four) hours as needed for severe pain. Must last 30 days    Dispense:  180 tablet    Refill:  0    Chronic Pain: STOP Act (Not applicable) Fill 1 day early if closed on refill date. Avoid benzodiazepines within 8 hours of opioids  . HYDROcodone-acetaminophen (NORCO/VICODIN) 5-325 MG tablet    Sig: Take 1 tablet by mouth every 4 (four) hours as needed for severe pain. Must last 30 days    Dispense:  180 tablet    Refill:  0    Chronic Pain: STOP Act (Not applicable) Fill 1 day early if closed on refill date. Avoid benzodiazepines within 8 hours of opioids  . HYDROcodone-acetaminophen (NORCO/VICODIN) 5-325 MG tablet    Sig: Take 1 tablet by mouth every 4 (four)  hours as needed for severe pain. Must last 30 days    Dispense:  180 tablet    Refill:  0    Chronic Pain: STOP Act (Not applicable) Fill 1 day early if closed on refill date. Avoid benzodiazepines within 8 hours of opioids   Orders:  No orders of the defined types were placed in this encounter.  Follow-up plan:   Return in about 3 months (around 11/25/2020) for (F2F), (Med Mgmt).      Interventional management options:  Considering:   Diagnostic bilateral cervical facetblock  Possible bilateral cervical facet RFA  Diagnostic right CESI  Diagnostic bilateral IA shoulder jointinjection  Diagnostic bilateral suprascapular NB  Possible bilateral suprascapular nerve RFA  Possible bilateral lumbar facet RFA  Diagnostic left sided carpal tunnel injection    Palliative PRN treatment(s):   Diagnostic bilateral lumbar facetblock #3 (w/o Steroids)    Recent Visits Date Type Provider Dept  05/27/20 Office Visit Milinda Pointer, MD [facility]  Showing recent visits within past 90 days and meeting all other requirements Today's Visits Date Type Provider Dept  08/24/20 Office Visit Milinda Pointer, MD  [facility]  Showing today's visits and meeting all other requirements Future Appointments No visits were found meeting these conditions. Showing future appointments within next 90 days and meeting all other requirements  I discussed the assessment and treatment plan with the patient. The patient was provided an opportunity to ask questions and all were answered. The patient agreed with the plan and demonstrated an understanding of the instructions.  Patient advised to call back or seek an in-person evaluation if the symptoms or condition worsens.  Duration of encounter: 30 minutes.  Note by: Gaspar Cola, MD Date: 08/24/2020; Time: 3:24 PM

## 2020-08-24 ENCOUNTER — Other Ambulatory Visit: Payer: Self-pay

## 2020-08-24 ENCOUNTER — Ambulatory Visit: Payer: Medicare Other | Attending: Pain Medicine | Admitting: Pain Medicine

## 2020-08-24 ENCOUNTER — Encounter: Payer: Self-pay | Admitting: Pain Medicine

## 2020-08-24 ENCOUNTER — Other Ambulatory Visit: Payer: Self-pay | Admitting: Pain Medicine

## 2020-08-24 DIAGNOSIS — G894 Chronic pain syndrome: Secondary | ICD-10-CM | POA: Insufficient documentation

## 2020-08-24 DIAGNOSIS — E538 Deficiency of other specified B group vitamins: Secondary | ICD-10-CM

## 2020-08-24 DIAGNOSIS — G4701 Insomnia due to medical condition: Secondary | ICD-10-CM

## 2020-08-24 DIAGNOSIS — G8929 Other chronic pain: Secondary | ICD-10-CM

## 2020-08-24 DIAGNOSIS — E559 Vitamin D deficiency, unspecified: Secondary | ICD-10-CM

## 2020-08-24 DIAGNOSIS — M7918 Myalgia, other site: Secondary | ICD-10-CM

## 2020-08-24 DIAGNOSIS — M797 Fibromyalgia: Secondary | ICD-10-CM

## 2020-08-24 DIAGNOSIS — E612 Magnesium deficiency: Secondary | ICD-10-CM

## 2020-08-24 DIAGNOSIS — M899 Disorder of bone, unspecified: Secondary | ICD-10-CM

## 2020-08-24 DIAGNOSIS — M792 Neuralgia and neuritis, unspecified: Secondary | ICD-10-CM

## 2020-08-24 MED ORDER — HYDROCODONE-ACETAMINOPHEN 5-325 MG PO TABS: 1.0000 | ORAL_TABLET | ORAL | 0 refills | Status: DC | PRN

## 2020-08-24 MED ORDER — VITAMIN D3 50 MCG (2000 UT) PO CAPS: 2000.0000 [IU] | ORAL_CAPSULE | Freq: Every day | ORAL | 2 refills | Status: DC

## 2020-08-24 MED ORDER — MAGNESIUM GLUCONATE 500 MG PO TABS: 500.0000 mg | ORAL_TABLET | Freq: Every day | ORAL | 2 refills | Status: DC

## 2020-08-24 MED ORDER — CYCLOBENZAPRINE HCL 5 MG PO TABS: 5.0000 mg | ORAL_TABLET | Freq: Three times a day (TID) | ORAL | 0 refills | Status: DC | PRN

## 2020-08-24 MED ORDER — CYANOCOBALAMIN 2000 MCG PO TABS: 2000.0000 ug | ORAL_TABLET | Freq: Every day | ORAL | 2 refills | Status: DC

## 2020-08-24 MED ORDER — MELATONIN 10 MG PO CAPS: 20.0000 mg | ORAL_CAPSULE | Freq: Every evening | ORAL | 2 refills | Status: DC | PRN

## 2020-08-24 MED ORDER — PREGABALIN 100 MG PO CAPS: 100.0000 mg | ORAL_CAPSULE | Freq: Every day | ORAL | 2 refills | Status: DC

## 2020-08-24 NOTE — Patient Instructions (Signed)
____________________________________________________________________________________________  Medication Rules  Purpose: To inform patients, and their family members, of our rules and regulations.  Applies to: All patients receiving prescriptions (written or electronic).  Pharmacy of record: Pharmacy where electronic prescriptions will be sent. If written prescriptions are taken to a different pharmacy, please inform the nursing staff. The pharmacy listed in the electronic medical record should be the one where you would like electronic prescriptions to be sent.  Electronic prescriptions: In compliance with the [place] Strengthen Opioid Misuse Prevention (STOP) Act of 2017 (Session Law 2017-74/H243), effective September 12, 2018, all controlled substances must be electronically prescribed. Calling prescriptions to the pharmacy will cease to exist.  Prescription refills: Only during scheduled appointments. Applies to all prescriptions.  NOTE: The following applies primarily to controlled substances (Opioid* Pain Medications).   Type of encounter (visit): For patients receiving controlled substances, face-to-face visits are required. (Not an option or up to the patient.)  Patient's responsibilities: 1. Pain Pills: Bring all pain pills to every appointment (except for procedure appointments). 2. Pill Bottles: Bring pills in original pharmacy bottle. Always bring the newest bottle. Bring bottle, even if empty. 3. Medication refills: You are responsible for knowing and keeping track of what medications you take and those you need refilled. The day before your appointment: write a list of all prescriptions that need to be refilled. The day of the appointment: give the list to the admitting nurse. Prescriptions will be written only during appointments. No prescriptions will be written on procedure days. If you forget a medication: it will not be "Called in", "Faxed", or "electronically sent".  You will need to get another appointment to get these prescribed. No early refills. Do not call asking to have your prescription filled early. 4. Prescription Accuracy: You are responsible for carefully inspecting your prescriptions before leaving our office. Have the discharge nurse carefully go over each prescription with you, before taking them home. Make sure that your name is accurately spelled, that your address is correct. Check the name and dose of your medication to make sure it is accurate. Check the number of pills, and the written instructions to make sure they are clear and accurate. Make sure that you are given enough medication to last until your next medication refill appointment. 5. Taking Medication: Take medication as prescribed. When it comes to controlled substances, taking less pills or less frequently than prescribed is permitted and encouraged. Never take more pills than instructed. Never take medication more frequently than prescribed.  6. Inform other Doctors: Always inform, all of your healthcare providers, of all the medications you take. 7. Pain Medication from other Providers: You are not allowed to accept any additional pain medication from any other Doctor or Healthcare provider. There are two exceptions to this rule. (see below) In the event that you require additional pain medication, you are responsible for notifying us, as stated below. 8. Cough Medicine: Often these contain an opioid, such as codeine or hydrocodone. Never accept or take cough medicine containing these opioids if you are already taking an opioid* medication. The combination may cause respiratory failure and death. 9. Medication Agreement: You are responsible for carefully reading and following our Medication Agreement. This must be signed before receiving any prescriptions from our practice. Safely store a copy of your signed Agreement. Violations to the Agreement will result in no further prescriptions.  (Additional copies of our Medication Agreement are available upon request.) 10. Laws, Rules, & Regulations: All patients are expected to follow all   Federal and State Laws, Statutes, Rules, & Regulations. Ignorance of the Laws does not constitute a valid excuse.  11. Illegal drugs and Controlled Substances: The use of illegal substances (including, but not limited to marijuana and its derivatives) and/or the illegal use of any controlled substances is strictly prohibited. Violation of this rule may result in the immediate and permanent discontinuation of any and all prescriptions being written by our practice. The use of any illegal substances is prohibited. 12. Adopted CDC guidelines & recommendations: Target dosing levels will be at or below 60 MME/day. Use of benzodiazepines** is not recommended.  Exceptions: There are only two exceptions to the rule of not receiving pain medications from other Healthcare Providers. 1. Exception #1 (Emergencies): In the event of an emergency (i.e.: accident requiring emergency care), you are allowed to receive additional pain medication. However, you are responsible for: As soon as you are able, call our office [phone], at any time of the day or night, and leave a message stating your name, the date and nature of the emergency, and the name and dose of the medication prescribed. In the event that your call is answered by a member of our staff, make sure to document and save the date, time, and the name of the person that took your information.  2. Exception #2 (Planned Surgery): In the event that you are scheduled by another doctor or dentist to have any type of surgery or procedure, you are allowed (for a period no longer than 30 days), to receive additional pain medication, for the acute post-op pain. However, in this case, you are responsible for picking up a copy of our "Post-op Pain Management for Surgeons" handout, and giving it to your surgeon or dentist. This  document is available at our office, and does not require an appointment to obtain it. Simply go to our office during business hours (Monday-Thursday from 8:00 AM to 4:00 PM) (Friday 8:00 AM to 12:00 Noon) or if you have a scheduled appointment with us, prior to your surgery, and ask for it by name. In addition, you are responsible for: calling our office [phone], at any time of the day or night, and leaving a message stating your name, name of your surgeon, type of surgery, and date of procedure or surgery. Failure to comply with your responsibilities may result in termination of therapy involving the controlled substances.  *Opioid medications include: morphine, codeine, oxycodone, oxymorphone, hydrocodone, hydromorphone, meperidine, tramadol, tapentadol, buprenorphine, fentanyl, methadone. **Benzodiazepine medications include: diazepam (Valium), alprazolam (Xanax), clonazepam (Klonopine), lorazepam (Ativan), clorazepate (Tranxene), chlordiazepoxide (Librium), estazolam (Prosom), oxazepam (Serax), temazepam (Restoril), triazolam (Halcion) (Last updated: 08/10/2020) ____________________________________________________________________________________________   ____________________________________________________________________________________________  Medication Recommendations and Reminders  Applies to: All patients receiving prescriptions (written and/or electronic).  Medication Rules & Regulations: These rules and regulations exist for your safety and that of others. They are not flexible and neither are we. Dismissing or ignoring them will be considered "non-compliance" with medication therapy, resulting in complete and irreversible termination of such therapy. (See document titled "Medication Rules" for more details.) In all conscience, because of safety reasons, we cannot continue providing a therapy where the patient does not follow instructions.  Pharmacy of record:   Definition:  This is the pharmacy where your electronic prescriptions will be sent.   We do not endorse any particular pharmacy, however, we have experienced problems with [facility] not securing enough medication supply for the community.  We do not restrict you in your choice of pharmacy. However,   once we write for your prescriptions, we will NOT be re-sending more prescriptions to fix restricted supply problems created by your pharmacy, or your insurance.   The pharmacy listed in the electronic medical record should be the one where you want electronic prescriptions to be sent.  If you choose to change pharmacy, simply notify our nursing staff.  Recommendations:  Keep all of your pain medications in a safe place, under lock and key, even if you live alone. We will NOT replace lost, stolen, or damaged medication.  After you fill your prescription, take 1 week's worth of pills and put them away in a safe place. You should keep a separate, properly labeled bottle for this purpose. The remainder should be kept in the original bottle. Use this as your primary supply, until it runs out. Once it's gone, then you know that you have 1 week's worth of medicine, and it is time to come in for a prescription refill. If you do this correctly, it is unlikely that you will ever run out of medicine.  To make sure that the above recommendation works, it is very important that you make sure your medication refill appointments are scheduled at least 1 week before you run out of medicine. To do this in an effective manner, make sure that you do not leave the office without scheduling your next medication management appointment. Always ask the nursing staff to show you in your prescription , when your medication will be running out. Then arrange for the receptionist to get you a return appointment, at least 7 days before you run out of medicine. Do not wait until you have 1 or 2 pills left, to come in. This is very poor planning and  does not take into consideration that we may need to cancel appointments due to bad weather, sickness, or emergencies affecting our staff.  DO NOT ACCEPT A "Partial Fill": If for any reason your pharmacy does not have enough pills/tablets to completely fill or refill your prescription, do not allow for a "partial fill". The law allows the pharmacy to complete that prescription within 72 hours, without requiring a new prescription. If they do not fill the rest of your prescription within those 72 hours, you will need a separate prescription to fill the remaining amount, which we will NOT provide. If the reason for the partial fill is your insurance, you will need to talk to the pharmacist about payment alternatives for the remaining tablets, but again, DO NOT ACCEPT A PARTIAL FILL, unless you can trust your pharmacist to obtain the remainder of the pills within 72 hours.  Prescription refills and/or changes in medication(s):   Prescription refills, and/or changes in dose or medication, will be conducted only during scheduled medication management appointments. (Applies to both, written and electronic prescriptions.)  No refills on procedure days. No medication will be changed or started on procedure days. No changes, adjustments, and/or refills will be conducted on a procedure day. Doing so will interfere with the diagnostic portion of the procedure.  No phone refills. No medications will be "called into the pharmacy".  No Fax refills.  No weekend refills.  No Holliday refills.  No after hours refills.  Remember:  Business hours are:  Monday to Thursday 8:00 AM to 4:00 PM Provider's Schedule: Taira Knabe, MD - Appointments are:  Medication management: Monday and Wednesday 8:00 AM to 4:00 PM Procedure day: Tuesday and Thursday 7:30 AM to 4:00 PM Bilal Lateef, MD - Appointments are:    Medication management: Tuesday and Thursday 8:00 AM to 4:00 PM Procedure day: Monday and Wednesday  7:30 AM to 4:00 PM (Last update: 04/01/2020) ____________________________________________________________________________________________   ____________________________________________________________________________________________  CBD (cannabidiol) WARNING  Applicable to: All individuals currently taking or considering taking CBD (cannabidiol) and, more important, all patients taking opioid analgesic controlled substances (pain medication). (Example: oxycodone; oxymorphone; hydrocodone; hydromorphone; morphine; methadone; tramadol; tapentadol; fentanyl; buprenorphine; butorphanol; dextromethorphan; meperidine; codeine; etc.)  Legal status: CBD remains a Schedule I drug prohibited for any use. CBD is illegal with one exception. In the [place], CBD has a limited Food and Drug Administration (FDA) approval for the treatment of two specific types of epilepsy disorders. Only one CBD product has been approved by the FDA for this purpose: "Epidiolex". FDA is aware that some companies are marketing products containing cannabis and cannabis-derived compounds in ways that violate the Federal Food, Drug and Cosmetic Act (FD&C Act) and that may put the health and safety of consumers at risk. The FDA, a Federal agency, has not enforced the CBD status since 2018.   Legality: Some manufacturers ship CBD products nationally, which is illegal. Often such products are sold online and are therefore available throughout the country. CBD is openly sold in head shops and health food stores in some states where such sales have not been explicitly legalized. Selling unapproved products with unsubstantiated therapeutic claims is not only a violation of the law, but also can put patients at risk, as these products have not been proven to be safe or effective. Federal illegality makes it difficult to conduct research on CBD.  Reference: "FDA Regulation of Cannabis and Cannabis-Derived Products, Including Cannabidiol  (CBD)" - https://www.fda.gov/news-events/public-health-focus/fda-regulation-cannabis-and-cannabis-derived-products-including-cannabidiol-cbd  Warning: CBD is not FDA approved and has not undergo the same manufacturing controls as prescription drugs.  This means that the purity and safety of available CBD may be questionable. Most of the time, despite manufacturer's claims, it is contaminated with THC (delta-9-tetrahydrocannabinol - the chemical in marijuana responsible for the "HIGH").  When this is the case, the THC contaminant will trigger a positive urine drug screen (UDS) test for Marijuana (carboxy-THC). Because a positive UDS for any illicit substance is a violation of our medication agreement, your opioid analgesics (pain medicine) may be permanently discontinued.  MORE ABOUT CBD  General Information: CBD  is a derivative of the Marijuana (cannabis sativa) plant discovered in 1940. It is one of the 113 identified substances found in Marijuana. It accounts for up to 40% of the plant's extract. As of 2018, preliminary clinical studies on CBD included research for the treatment of anxiety, movement disorders, and pain. CBD is available and consumed in multiple forms, including inhalation of smoke or vapor, as an aerosol spray, and by mouth. It may be supplied as an oil containing CBD, capsules, dried cannabis, or as a liquid solution. CBD is thought not to be as psychoactive as THC (delta-9-tetrahydrocannabinol - the chemical in marijuana responsible for the "HIGH"). Studies suggest that CBD may interact with different biological target receptors in the body, including cannabinoid and other neurotransmitter receptors. As of 2018 the mechanism of action for its biological effects has not been determined.  Side-effects  Adverse reactions: Dry mouth, diarrhea, decreased appetite, fatigue, drowsiness, malaise, weakness, sleep disturbances, and others.  Drug interactions: CBC may interact with other  medications such as blood-thinners. (Last update: 04/18/2020) ____________________________________________________________________________________________    

## 2020-08-24 NOTE — Progress Notes (Signed)
Nursing Pain Medication Assessment:  Safety precautions to be maintained throughout the outpatient stay will include: orient to surroundings, keep bed in low position, maintain call bell within reach at all times, provide assistance with transfer out of bed and ambulation.  Medication Inspection Compliance: Pill count conducted under aseptic conditions, in front of the patient. Neither the pills nor the bottle was removed from the patient's sight at any time. Once count was completed pills were immediately returned to the patient in their original bottle.  Medication: Hydrocodone/APAP Pill/Patch Count: 52 of 180 pills remain Pill/Patch Appearance: Markings consistent with prescribed medication Bottle Appearance: Standard pharmacy container. Clearly labeled. Filled Date: 38 / 34 / 2021 Last Medication intake:  Today

## 2020-11-13 ENCOUNTER — Other Ambulatory Visit: Payer: Self-pay | Admitting: Dermatology

## 2020-11-13 DIAGNOSIS — L439 Lichen planus, unspecified: Secondary | ICD-10-CM

## 2020-11-21 NOTE — Progress Notes (Signed)
PROVIDER NOTE: Information contained herein reflects review and annotations entered in association with encounter. Interpretation of such information and data should be left to medically-trained personnel. Information provided to patient can be located elsewhere in the medical record under "Patient Instructions". Document created using STT-dictation technology, any transcriptional errors that may result from process are unintentional.    Patient: Zoe Sanchez  Service Category: E/M  Provider: Oswaldo Done, MD  DOB: 04/13/1956  DOS: 11/25/2020  Specialty: Interventional Pain Management  MRN: [id number]  Setting: Ambulatory outpatient  PCP: Hillery Aldo, MD  Type: Established Patient    Referring Provider: Hillery Aldo, MD  Location: Office  Delivery: Face-to-face     HPI  [name], a 65 y.o. year old female, is here today because of her Chronic pain syndrome [G89.4]. [name] primary complain today is Back Pain and Neck Pain (base) Last encounter: My last encounter with her was on 08/24/2020. Pertinent problems: [name] has Myofascial pain syndrome; Cervical facet syndrome (Bilateral) (R>L); Chronic neck pain (1ry area of Pain) (Bilateral) (R>L); Chronic pain syndrome; Cervical spondylosis; Fibromyalgia; Cervical herniated disc (C5-6 and C6-7); Cervical foraminal stenosis (Bilateral) (C5-6); Chronic cervical radicular pain (Bilateral) (R>L) (C8 Dermatome); Chronic low back pain (3ry area of Pain) (Bilateral) (R>L); Lumbar spondylosis; Family history of chronic pain; Carpal tunnel syndrome (Left); Chronic shoulder pain (Bilateral); Cervical facet arthropathy; Lumbar facet arthropathy; Bimalleolar fracture of left ankle; Lumbar facet syndrome (Bilateral) (R>L); Chronic upper extremity pain (2ry area of Pain) (Bilateral) (R>L); L4-5 Lumbar Facet joint Synovial cyst; Chronic lower extremity pain (Bilateral) (R>L); Chronic lumbar radicular pain (Bilateral) (S1); Arthropathy of lumbar  facet joint; Cervical syndrome; Lumbar spondylosis; Synovial cyst of lumbar spine; Prolapsed cervical intervertebral disc; Lumbar facet joint pain; Cervical radiculopathy; Stenosis of intervertebral foramina; Pain in lower limb; Shoulder pain; Chronic musculoskeletal pain; Neurogenic pain; Closed trimalleolar fracture of left ankle; Bilateral lower extremity edema; and Chronic pain of multiple sites on their pertinent problem list. Pain Assessment: Severity of Chronic pain is reported as a 2 /10. Location: Neck  /right shoulder down arm near elbow. Onset: More than a month ago. Quality: Aching,Constant. Timing: Constant. Modifying factor(s): medications. Vitals:  height is 5' 3.5" (1.613 m) and weight is 155 lb (70.3 kg). Her temperature is 97.1 F (36.2 C) (abnormal). Her blood pressure is 156/79 (abnormal) and her pulse is 85. Her respiration is 16 and oxygen saturation is 99%.   Reason for encounter: medication management. The patient indicates doing well with the current medication regimen. No adverse reactions or side effects reported to the medications.   RTCB: 02/23/2021 Nonopioids transferred 08/24/2020: Magnesium, Flexeril, Lyrica, melatonin, vitamin B12, and vitamin D3.  Pharmacotherapy Assessment   Analgesic: Hydrocodone/APAP 5/325, 1 tab PO q 4 hrs (30 mg/day of hydrocodone)(1,950 mg/day of acetaminophen) MME/day:30 mg/day.   Monitoring: [place] PMP: PDMP reviewed during this encounter.       Pharmacotherapy: No side-effects or adverse reactions reported. Compliance: No problems identified. Effectiveness: Clinically acceptable.  Newman Pies, RN  11/25/2020  1:48 PM  Sign when Signing Visit Nursing Pain Medication Assessment:  Safety precautions to be maintained throughout the outpatient stay will include: orient to surroundings, keep bed in low position, maintain call bell within reach at all times, provide assistance with transfer out of bed and ambulation.  Medication Inspection  Compliance: Pill count conducted under aseptic conditions, in front of the patient. Neither the pills nor the bottle was removed from the patient's sight at any time. Once  count was completed pills were immediately returned to the patient in their original bottle.  Medication: Hydrocodone Pill/Patch Count: 67 of 180 pills remain Pill/Patch Appearance: Markings consistent with prescribed medication Bottle Appearance: Standard pharmacy container. Clearly labeled. Filled Date: 2 / 64 / 2022 Last Medication intake:  Today    UDS:  Summary  Date Value Ref Range Status  04/22/2020 Note  Final    Comment:    ==================================================================== [facility] (MW) ==================================================================== Test                             Result       Flag       Units  Drug Present and Declared for Prescription Verification   Hydrocodone                    5091         EXPECTED   ng/mg creat   Hydromorphone                  346          EXPECTED   ng/mg creat   Dihydrocodeine                 1514         EXPECTED   ng/mg creat   Norhydrocodone                 5204         EXPECTED   ng/mg creat    Sources of hydrocodone include scheduled prescription medications.    Hydromorphone, dihydrocodeine and norhydrocodone are expected    metabolites of hydrocodone. Hydromorphone and dihydrocodeine are    also available as scheduled prescription medications.  ==================================================================== Test                      Result    Flag   Units      Ref Range   Creatinine              57               mg/dL      >=40 ==================================================================== Declared Medications:  The flagging and interpretation on this report are based on the  following declared medications.  Unexpected results may arise from  inaccuracies in the declared medications.   **Note: The testing  scope of this panel includes these medications:   Hydrocodone (Norco)   **Note: The testing scope of this panel does not include the  following reported medications:   Acetaminophen (Norco)  Albuterol (Ventolin HFA)  Amitriptyline (Elavil)  Cyanocobalamin  Cyclobenzaprine (Flexeril)  Dicyclomine (Bentyl)  Estradiol (Estrace)  Fluticasone (Flonase)  Gemfibrozil (Lopid)  Magnesium (Magonate)  Melatonin  Metronidazole (Flagyl)  Montelukast (Singulair)  Omeprazole (Prilosec)  Pregabalin (Lyrica)  Propranolol (Inderal)  Salmeterol  Umeclidinium (Incruse Ellipta)  Vitamin D3  Zolpidem (Ambien) ==================================================================== For clinical consultation, please call [phone]. ====================================================================      ROS  Constitutional: Denies any fever or chills Gastrointestinal: No reported hemesis, hematochezia, vomiting, or acute GI distress Musculoskeletal: Denies any acute onset joint swelling, redness, loss of ROM, or weakness Neurological: No reported episodes of acute onset apraxia, aphasia, dysarthria, agnosia, amnesia, paralysis, loss of coordination, or loss of consciousness  Medication Review  HYDROcodone-acetaminophen, Melatonin, Vitamin D3, albuterol, amitriptyline, cyanocobalamin, cyclobenzaprine, dicyclomine, estradiol, fluticasone, gemfibrozil, magnesium gluconate, metroNIDAZOLE, montelukast, omeprazole, pregabalin, propranolol, salmeterol, umeclidinium bromide,  and zolpidem  History Review  Allergy: Ms. Counihan is allergic to prednisone. Drug: Ms. Givler  reports no history of drug use. Alcohol:  reports no history of alcohol use. Tobacco:  reports that she has never smoked. She has never used smokeless tobacco. Social: Ms. Fulwiler  reports that she has never smoked. She has never used smokeless tobacco. She reports that she does not drink alcohol and does not use drugs. Medical:   has a past medical history of Abnormal heart rhythm, Abnormal mammogram (02/25/2014), Actinic keratosis, Anemia, Anxiety, Asthma, Awareness of heartbeats (06/29/2015), Basal cell carcinoma (04/24/2008), Basal cell carcinoma (04/09/2009), Basal cell carcinoma (BCC) (12/23/2014), Breath shortness (06/29/2015), CAD (coronary artery disease), Cancer (HCC), Cervical radicular pain (Location of Secondary source of pain) (Bilateral) (R>L) (C8 Dermatome) (06/29/2015), Chronic pain, COPD (chronic obstructive pulmonary disease) (HCC), DDD (degenerative disc disease), cervical, Degenerative disk disease, Depression, Dysrhythmia, Endometriosis, Family history of chronic pain (06/29/2015), Fibromyalgia, Fibromyalgia, GERD (gastroesophageal reflux disease), Heart murmur, Hiatal hernia, History of attempted suicide (06/29/2015), History of hiatal hernia (06/29/2015), History of psychiatric disorder (06/29/2015), History of suicidal ideation (06/29/2015), Hyperlipidemia, Insomnia, Insomnia, Irritable bowel syndrome, Squamous cell carcinoma of skin (03/08/2007), 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 Surgical: Ms. Roehr  has a past surgical history that includes Abdominal hysterectomy (1986); Tonsillectomy (1957); Skin lesion excision; Oophorectomy (1986); Breast surgery; ORIF ankle fracture (Left, 03/14/2018); ORIF ankle fracture (Left, 07/26/2018); Ankle surgery; Colonoscopy with propofol (N/A, 06/05/2019); Breast biopsy (Right, 2016); Fracture surgery; and Colonoscopy with propofol (N/A, 03/05/2020). Family: family history includes Breast cancer in her maternal aunt; Heart disease in her father; Hypertension in her father and mother.  Laboratory Chemistry Profile   Renal Lab Results  Component Value Date   BUN 21 04/26/2019   CREATININE 0.84 04/26/2019   GFRAA >60 04/26/2019   GFRNONAA >60 04/26/2019     Hepatic Lab Results  Component Value Date   AST 35  04/26/2019   ALT 22 04/26/2019   ALBUMIN 4.1 04/26/2019   ALKPHOS 71 04/26/2019     Electrolytes Lab Results  Component Value Date   NA 135 04/26/2019   K 4.5 04/26/2019   CL 103 04/26/2019   CALCIUM 9.4 04/26/2019   MG 1.8 04/26/2019     Bone Lab Results  Component Value Date   VD25OH 40.8 04/26/2019   25OHVITD1 49 03/09/2016   25OHVITD2 <1.0 03/09/2016   25OHVITD3 48 03/09/2016     Inflammation (CRP: Acute Phase) (ESR: Chronic Phase) Lab Results  Component Value Date   CRP 2.2 (H) 04/26/2019   ESRSEDRATE 39 (H) 04/26/2019       Note: Above Lab results reviewed.  Recent Imaging Review  MM 3D SCREEN BREAST BILATERAL CLINICAL DATA:  Screening.  EXAM: DIGITAL SCREENING BILATERAL MAMMOGRAM WITH TOMO AND CAD  COMPARISON:  Previous exam(s).  ACR Breast Density Category c: The breast tissue is heterogeneously dense, which may obscure small masses.  FINDINGS: There are no findings suspicious for malignancy. Images were processed with CAD.  IMPRESSION: No mammographic evidence of malignancy. A result letter of this screening mammogram will be mailed directly to the patient.  RECOMMENDATION: Screening mammogram in one year. (Code:SM-B-01Y)  BI-RADS CATEGORY  1: Negative.  Electronically Signed   By: Harmon Pier M.D.   On: 06/25/2019 11:04 Note: Reviewed        Physical Exam  General appearance: Well nourished, well developed, and well hydrated. In no apparent acute distress Mental status: Alert, oriented x  3 (person, place, & time)       Respiratory: No evidence of acute respiratory distress Eyes: PERLA Vitals: BP (!) 156/79   Pulse 85   Temp (!) 97.1 F (36.2 C)   Resp 16   Ht 5' 3.5" (1.613 m)   Wt 155 lb (70.3 kg)   SpO2 99%   BMI 27.03 kg/m  BMI: Estimated body mass index is 27.03 kg/m as calculated from the following:   Height as of this encounter: 5' 3.5" (1.613 m).   Weight as of this encounter: 155 lb (70.3 kg). Ideal: Ideal body  weight: 53.5 kg (118 lb 0.9 oz) Adjusted ideal body weight: 60.3 kg (132 lb 13.3 oz)  Assessment   Status Diagnosis  Controlled Controlled Controlled 1. Chronic pain syndrome   2. Chronic neck pain (1ry area of Pain) (Bilateral) (R>L)   3. Chronic upper extremity pain (2ry area of Pain) (Bilateral) (R>L)   4. Chronic low back pain (3ry area of Pain) (Bilateral) (R>L)   5. Cervical foraminal stenosis (Bilateral) (C5-6)   6. Cervical herniated disc (C5-6 and C6-7)   7. L4-5 Lumbar Facet joint Synovial cyst   8. Lumbar facet arthropathy   9. Pharmacologic therapy   10. Uncomplicated opioid dependence (HCC)      Updated Problems: Problem  L4-5 Lumbar Facet joint Synovial cyst  Chronic upper extremity pain (2ry area of Pain) (Bilateral) (R>L)  Lumbar Facet Joint Pain  Lumbar facet arthropathy  Chronic neck pain (1ry area of Pain) (Bilateral) (R>L)  Chronic low back pain (3ry area of Pain) (Bilateral) (R>L)    Plan of Care  Problem-specific:  No problem-specific Assessment & Plan notes found for this encounter.  Ms. MIRI TURNIER has a current medication list which includes the following long-term medication(s): albuterol, amitriptyline, dicyclomine, estradiol, fluticasone, gemfibrozil, montelukast, omeprazole, propranolol, salmeterol, vitamin d3, cyanocobalamin, cyclobenzaprine, hydrocodone-acetaminophen, [START ON 12/25/2020] hydrocodone-acetaminophen, [START ON 01/24/2021] hydrocodone-acetaminophen, magnesium gluconate, melatonin, and pregabalin.  Pharmacotherapy (Medications Ordered): Meds ordered this encounter  Medications  . HYDROcodone-acetaminophen (NORCO/VICODIN) 5-325 MG tablet    Sig: Take 1 tablet by mouth every 4 (four) hours as needed for severe pain. Must last 30 days    Dispense:  180 tablet    Refill:  0    Chronic Pain: STOP Act (Not applicable) Fill 1 day early if closed on refill date. Avoid benzodiazepines within 8 hours of opioids  .  HYDROcodone-acetaminophen (NORCO/VICODIN) 5-325 MG tablet    Sig: Take 1 tablet by mouth every 4 (four) hours as needed for severe pain. Must last 30 days    Dispense:  180 tablet    Refill:  0    Chronic Pain: STOP Act (Not applicable) Fill 1 day early if closed on refill date. Avoid benzodiazepines within 8 hours of opioids  . HYDROcodone-acetaminophen (NORCO/VICODIN) 5-325 MG tablet    Sig: Take 1 tablet by mouth every 4 (four) hours as needed for severe pain. Must last 30 days    Dispense:  180 tablet    Refill:  0    Chronic Pain: STOP Act (Not applicable) Fill 1 day early if closed on refill date. Avoid benzodiazepines within 8 hours of opioids   Orders:  No orders of the defined types were placed in this encounter.  Follow-up plan:   Return in about 3 months (around 02/23/2021) for (F2F), (MM).      Interventional management options:  Considering:   Diagnostic bilateral cervical facetblock  Possible bilateral cervical facet RFA  Diagnostic right CESI  Diagnostic bilateral IA shoulder jointinjection  Diagnostic bilateral suprascapular NB  Possible bilateral suprascapular nerve RFA  Possible bilateral lumbar facet RFA  Diagnostic left sided carpal tunnel injection    Palliative PRN treatment(s):   Diagnostic bilateral lumbar facetblock #3 (w/o Steroids)     Recent Visits No visits were found meeting these conditions. Showing recent visits within past 90 days and meeting all other requirements Today's Visits Date Type Provider Dept  11/25/20 Office Visit Delano Metz, MD [facility]  Showing today's visits and meeting all other requirements Future Appointments No visits were found meeting these conditions. Showing future appointments within next 90 days and meeting all other requirements  I discussed the assessment and treatment plan with the patient. The patient was provided an opportunity to ask questions and all were answered. The patient agreed with  the plan and demonstrated an understanding of the instructions.  Patient advised to call back or seek an in-person evaluation if the symptoms or condition worsens.  Duration of encounter: 30 minutes.  Note by: Oswaldo Done, MD Date: 11/25/2020; Time: 1:55 PM

## 2020-11-25 ENCOUNTER — Ambulatory Visit: Payer: Medicare Other | Attending: Pain Medicine | Admitting: Pain Medicine

## 2020-11-25 ENCOUNTER — Encounter: Payer: Self-pay | Admitting: Pain Medicine

## 2020-11-25 ENCOUNTER — Other Ambulatory Visit: Payer: Self-pay

## 2020-11-25 VITALS — BP 156/79 | HR 85 | Temp 97.1°F | Resp 16 | Ht 63.5 in | Wt 155.0 lb

## 2020-11-25 DIAGNOSIS — M5020 Other cervical disc displacement, unspecified cervical region: Secondary | ICD-10-CM | POA: Diagnosis present

## 2020-11-25 DIAGNOSIS — M542 Cervicalgia: Secondary | ICD-10-CM | POA: Diagnosis not present

## 2020-11-25 DIAGNOSIS — M5441 Lumbago with sciatica, right side: Secondary | ICD-10-CM | POA: Diagnosis present

## 2020-11-25 DIAGNOSIS — G8929 Other chronic pain: Secondary | ICD-10-CM | POA: Diagnosis present

## 2020-11-25 DIAGNOSIS — M4802 Spinal stenosis, cervical region: Secondary | ICD-10-CM | POA: Diagnosis present

## 2020-11-25 DIAGNOSIS — M47816 Spondylosis without myelopathy or radiculopathy, lumbar region: Secondary | ICD-10-CM | POA: Diagnosis present

## 2020-11-25 DIAGNOSIS — M5442 Lumbago with sciatica, left side: Secondary | ICD-10-CM | POA: Diagnosis present

## 2020-11-25 DIAGNOSIS — G894 Chronic pain syndrome: Secondary | ICD-10-CM | POA: Diagnosis not present

## 2020-11-25 DIAGNOSIS — Z79899 Other long term (current) drug therapy: Secondary | ICD-10-CM | POA: Diagnosis present

## 2020-11-25 DIAGNOSIS — M79601 Pain in right arm: Secondary | ICD-10-CM | POA: Insufficient documentation

## 2020-11-25 DIAGNOSIS — F1120 Opioid dependence, uncomplicated: Secondary | ICD-10-CM | POA: Diagnosis present

## 2020-11-25 DIAGNOSIS — M7138 Other bursal cyst, other site: Secondary | ICD-10-CM | POA: Diagnosis present

## 2020-11-25 DIAGNOSIS — M79602 Pain in left arm: Secondary | ICD-10-CM | POA: Diagnosis present

## 2020-11-25 MED ORDER — HYDROCODONE-ACETAMINOPHEN 5-325 MG PO TABS: 1.0000 | ORAL_TABLET | ORAL | 0 refills | Status: DC | PRN

## 2020-11-25 NOTE — Patient Instructions (Signed)
____________________________________________________________________________________________  Medication Recommendations and Reminders  Applies to: All patients receiving prescriptions (written and/or electronic).  Medication Rules & Regulations: These rules and regulations exist for your safety and that of others. They are not flexible and neither are we. Dismissing or ignoring them will be considered "non-compliance" with medication therapy, resulting in complete and irreversible termination of such therapy. (See document titled "Medication Rules" for more details.) In all conscience, because of safety reasons, we cannot continue providing a therapy where the patient does not follow instructions.  Pharmacy of record:   Definition: This is the pharmacy where your electronic prescriptions will be sent.   We do not endorse any particular pharmacy, however, we have experienced problems with [facility] not securing enough medication supply for the community.  We do not restrict you in your choice of pharmacy. However, once we write for your prescriptions, we will NOT be re-sending more prescriptions to fix restricted supply problems created by your pharmacy, or your insurance.   The pharmacy listed in the electronic medical record should be the one where you want electronic prescriptions to be sent.  If you choose to change pharmacy, simply notify our nursing staff.  Recommendations:  Keep all of your pain medications in a safe place, under lock and key, even if you live alone. We will NOT replace lost, stolen, or damaged medication.  After you fill your prescription, take 1 week's worth of pills and put them away in a safe place. You should keep a separate, properly labeled bottle for this purpose. The remainder should be kept in the original bottle. Use this as your primary supply, until it runs out. Once it's gone, then you know that you have 1 week's worth of medicine, and it is time to come  in for a prescription refill. If you do this correctly, it is unlikely that you will ever run out of medicine.  To make sure that the above recommendation works, it is very important that you make sure your medication refill appointments are scheduled at least 1 week before you run out of medicine. To do this in an effective manner, make sure that you do not leave the office without scheduling your next medication management appointment. Always ask the nursing staff to show you in your prescription , when your medication will be running out. Then arrange for the receptionist to get you a return appointment, at least 7 days before you run out of medicine. Do not wait until you have 1 or 2 pills left, to come in. This is very poor planning and does not take into consideration that we may need to cancel appointments due to bad weather, sickness, or emergencies affecting our staff.  DO NOT ACCEPT A "Partial Fill": If for any reason your pharmacy does not have enough pills/tablets to completely fill or refill your prescription, do not allow for a "partial fill". The law allows the pharmacy to complete that prescription within 72 hours, without requiring a new prescription. If they do not fill the rest of your prescription within those 72 hours, you will need a separate prescription to fill the remaining amount, which we will NOT provide. If the reason for the partial fill is your insurance, you will need to talk to the pharmacist about payment alternatives for the remaining tablets, but again, DO NOT ACCEPT A PARTIAL FILL, unless you can trust your pharmacist to obtain the remainder of the pills within 72 hours.  Prescription refills and/or changes in medication(s):     Prescription refills, and/or changes in dose or medication, will be conducted only during scheduled medication management appointments. (Applies to both, written and electronic prescriptions.)  No refills on procedure days. No medication will be  changed or started on procedure days. No changes, adjustments, and/or refills will be conducted on a procedure day. Doing so will interfere with the diagnostic portion of the procedure.  No phone refills. No medications will be "called into the pharmacy".  No Fax refills.  No weekend refills.  No Holliday refills.  No after hours refills.  Remember:  Business hours are:  Monday to Thursday 8:00 AM to 4:00 PM Provider's Schedule: Vesta Wheeland, MD - Appointments are:  Medication management: Monday and Wednesday 8:00 AM to 4:00 PM Procedure day: Tuesday and Thursday 7:30 AM to 4:00 PM Bilal Lateef, MD - Appointments are:  Medication management: Tuesday and Thursday 8:00 AM to 4:00 PM Procedure day: Monday and Wednesday 7:30 AM to 4:00 PM (Last update: 04/01/2020) ____________________________________________________________________________________________    

## 2020-11-25 NOTE — Progress Notes (Signed)
Nursing Pain Medication Assessment:  Safety precautions to be maintained throughout the outpatient stay will include: orient to surroundings, keep bed in low position, maintain call bell within reach at all times, provide assistance with transfer out of bed and ambulation.  Medication Inspection Compliance: Pill count conducted under aseptic conditions, in front of the patient. Neither the pills nor the bottle was removed from the patient's sight at any time. Once count was completed pills were immediately returned to the patient in their original bottle.  Medication: Hydrocodone Pill/Patch Count: 67 of 180 pills remain Pill/Patch Appearance: Markings consistent with prescribed medication Bottle Appearance: Standard pharmacy container. Clearly labeled. Filled Date: 2 / 55 / 2022 Last Medication intake:  Today

## 2020-12-04 ENCOUNTER — Other Ambulatory Visit: Payer: Self-pay | Admitting: Family Medicine

## 2020-12-04 DIAGNOSIS — Z1231 Encounter for screening mammogram for malignant neoplasm of breast: Secondary | ICD-10-CM

## 2020-12-30 ENCOUNTER — Other Ambulatory Visit: Payer: Self-pay

## 2020-12-31 ENCOUNTER — Ambulatory Visit (INDEPENDENT_AMBULATORY_CARE_PROVIDER_SITE_OTHER): Payer: Medicare Other | Admitting: Dermatology

## 2020-12-31 ENCOUNTER — Other Ambulatory Visit: Payer: Self-pay

## 2020-12-31 DIAGNOSIS — L570 Actinic keratosis: Secondary | ICD-10-CM | POA: Diagnosis not present

## 2020-12-31 DIAGNOSIS — L853 Xerosis cutis: Secondary | ICD-10-CM

## 2020-12-31 DIAGNOSIS — L439 Lichen planus, unspecified: Secondary | ICD-10-CM | POA: Diagnosis not present

## 2020-12-31 MED ORDER — AMOXICILLIN 250 MG PO CAPS: 250.0000 mg | ORAL_CAPSULE | Freq: Every day | ORAL | 2 refills | Status: DC

## 2020-12-31 NOTE — Progress Notes (Signed)
   Follow-Up Visit   Subjective  Zoe Sanchez is a 65 y.o. female who presents for the following: Lichen planus (Bil legs, chest, back, arms, pt not taking anything now, was on flagyl in past, ) and check spot (L ear, ~34m).  The following portions of the chart were reviewed this encounter and updated as appropriate:   Tobacco  Allergies  Meds  Problems  Med Hx  Surg Hx  Fam Hx     Review of Systems:  No other skin or systemic complaints except as noted in HPI or Assessment and Plan.  Objective  Well appearing patient in no apparent distress; mood and affect are within normal limits.  A focused examination was performed including chest, arms, legs, ear, face. Relevant physical exam findings are noted in the Assessment and Plan.  Objective  bil legs, chest, arms: Few minimal pink paps chest Pink macules and some crust pretibial areas   Objective  Left Ear x 1: Pink scaly macules    Assessment & Plan    Xerosis - diffuse xerotic patches - recommend gentle, hydrating skin care - gentle skin care handout given - cont Oil of Olay soap, start Cerave cream qd  Lichen planus - Pt improved significantly while on oral amoxicillin recently for an infection.  She would like to take this for controlling her lichen planus. bil legs, chest, arms Advised patient that we can try a low-dose of amoxicillin for her lichen planus to see how she does.  Nevertheless I would not use the higher dose she used for an infection.  She would like to try this.  Severe and generalized Bx proven  Start Amoxicillian 250mg  1 po qd Cont Cln wash qd  amoxicillin (AMOXIL) 250 MG capsule - bil legs, chest, arms  Other Related Medications metroNIDAZOLE (FLAGYL) 500 MG tablet  AK (actinic keratosis) Left Ear x 1  Destruction of lesion - Left Ear x 1 Complexity: simple   Destruction method: cryotherapy   Informed consent: discussed and consent obtained   Timeout:  patient name, date of birth,  surgical site, and procedure verified Lesion destroyed using liquid nitrogen: Yes   Region frozen until ice ball extended beyond lesion: Yes   Outcome: patient tolerated procedure well with no complications   Post-procedure details: wound care instructions given    Return in about 3 months (around 10/04/4823) for Lichen planus.  I, Othelia Pulling, RMA, am acting as scribe for Sarina Ser, MD .  Documentation: I have reviewed the above documentation for accuracy and completeness, and I agree with the above.  Sarina Ser, MD

## 2020-12-31 NOTE — Patient Instructions (Addendum)
If you have any questions or concerns for your doctor, please call our main line at [phone] and press option 4 to reach your doctor's medical assistant. If no one answers, please leave a voicemail as directed and we will return your call as soon as possible. Messages left after 4 pm will be answered the following business day.   You may also send [place] a message via [place]. We typically respond to MyChart messages within 1-2 business days.  For prescription refills, please ask your pharmacy to contact our office. Our fax number is [phone].  If you have an urgent issue when the clinic is closed that cannot wait until the next business day, you can page your doctor at the number below.    Please note that while we do our best to be available for urgent issues outside of office hours, we are not available 24/7.   If you have an urgent issue and are unable to reach [place], you may choose to seek medical care at your doctor's office, retail clinic, urgent care center, or emergency room.  If you have a medical emergency, please immediately call 911 or go to the emergency department.  Pager Numbers  - Dr. Nehemiah Massed: [phone]  - Dr. Laurence Ferrari: [phone]  - Dr. Nicole Kindred: [phone]  In the event of inclement weather, please call our main line at [phone] for an update on the status of any delays or closures.  Dermatology Medication Tips: Please keep the boxes that topical medications come in in order to help keep track of the instructions about where and how to use these. Pharmacies typically print the medication instructions only on the boxes and not directly on the medication tubes.   If your medication is too expensive, please contact our office at [phone] option 4 or send [place] a message through [place].   We are unable to tell what your co-pay for medications will be in advance as this is different depending on your insurance coverage. However, we may be able to find a substitute  medication at lower cost or fill out paperwork to get insurance to cover a needed medication.   If a prior authorization is required to get your medication covered by your insurance company, please allow [place] 1-2 business days to complete this process.  Drug prices often vary depending on where the prescription is filled and some pharmacies may offer cheaper prices.  The website www.goodrx.com contains coupons for medications through different pharmacies. The prices here do not account for what the cost may be with help from insurance (it may be cheaper with your insurance), but the website can give you the price if you did not use any insurance.  - You can print the associated coupon and take it with your prescription to the pharmacy.  - You may also stop by our office during regular business hours and pick up a GoodRx coupon card.  - If you need your prescription sent electronically to a different pharmacy, notify our office through [facility] or by phone at [phone] option 4.   Start Cerave cream to whole body as moisturizer daily

## 2021-01-04 ENCOUNTER — Encounter: Payer: Self-pay | Admitting: Dermatology

## 2021-01-12 ENCOUNTER — Encounter: Payer: Self-pay | Admitting: *Deleted

## 2021-02-09 DIAGNOSIS — S92353A Displaced fracture of fifth metatarsal bone, unspecified foot, initial encounter for closed fracture: Secondary | ICD-10-CM | POA: Insufficient documentation

## 2021-02-16 NOTE — Progress Notes (Signed)
PROVIDER NOTE: Information contained herein reflects review and annotations entered in association with encounter. Interpretation of such information and data should be left to medically-trained personnel. Information provided to patient can be located elsewhere in the medical record under "Patient Instructions". Document created using STT-dictation technology, any transcriptional errors that may result from process are unintentional.    Patient: Zoe Sanchez  Service Category: E/M  Provider: Gaspar Cola, MD  DOB: 1956/06/22  DOS: 02/17/2021  Specialty: Interventional Pain Management  MRN: [id number]  Setting: Ambulatory outpatient  PCP: Denton Lank, MD  Type: Established Patient    Referring Provider: Denton Lank, MD  Location: Office  Delivery: Face-to-face     HPI  Zoe Sanchez, a 65 y.o. year old female, is here today because of her Chronic pain syndrome [G89.4]. Zoe Sanchez primary complain today is Neck Pain (More on right side) Last encounter: My last encounter with her was on 11/25/2020. Pertinent problems: Zoe Sanchez has Myofascial pain syndrome; Cervical facet syndrome (Bilateral) (R>L); Chronic neck pain (1ry area of Pain) (Bilateral) (R>L); Chronic pain syndrome; Cervical spondylosis; Fibromyalgia; Cervical herniated disc (C5-6 and C6-7); Cervical foraminal stenosis (Bilateral) (C5-6); Chronic cervical radicular pain (Bilateral) (R>L) (C8 Dermatome); Chronic low back pain (3ry area of Pain) (Bilateral) (R>L); Lumbar spondylosis; Family history of chronic pain; Carpal tunnel syndrome (Left); Chronic shoulder pain (Bilateral); Cervical facet arthropathy; Lumbar facet arthropathy; Bimalleolar fracture of left ankle; Lumbar facet syndrome (Bilateral) (R>L); Chronic upper extremity pain (2ry area of Pain) (Bilateral) (R>L); L4-5 Lumbar Facet joint Synovial cyst; Chronic lower extremity pain (Bilateral) (R>L); Chronic lumbar radicular pain (Bilateral) (S1); Arthropathy of lumbar  facet joint; Cervical syndrome; Lumbar spondylosis; Synovial cyst of lumbar spine; Prolapsed cervical intervertebral disc; Lumbar facet joint pain; Cervical radiculopathy; Stenosis of intervertebral foramina; Pain in lower limb; Shoulder pain; Chronic musculoskeletal pain; Neurogenic pain; Closed trimalleolar fracture of left ankle; Bilateral lower extremity edema; Chronic pain of multiple sites; and Closed fracture of fifth metatarsal bone on their pertinent problem list. Pain Assessment: Severity of Chronic pain is reported as a 2 /10. Location: Neck Right/radiates down arms to fingers, worse on the right side. Onset: More than a month ago. Quality: Sharp,Aching,Numbness,Pins and needles,Tingling. Timing: Intermittent. Modifying factor(s): medications, muscle relaxer. Vitals:  height is '5\' 3"'  (1.6 m) and weight is 155 lb (70.3 kg). Her temperature is 97.2 F (36.2 C) (abnormal). Her blood pressure is 140/70 and her pulse is 76. Her respiration is 18 and oxygen saturation is 98%.   Reason for encounter: medication management.   The patient indicates doing well with the current medication regimen. No adverse reactions or side effects reported to the medications.   Recently fracture her fifth metatarsal bone.  RTCB: 05/24/2021 Nonopioids transferred 08/24/2020: Magnesium, Flexeril, Lyrica, melatonin, vitamin B12, and vitamin D3.  Pharmacotherapy Assessment   Analgesic: Hydrocodone/APAP 5/325, 1 tab PO q 4 hrs (30 mg/day of hydrocodone)(1,950 mg/day of acetaminophen) MME/day:30 mg/day.   Monitoring: [place] PMP: PDMP reviewed during this encounter.       Pharmacotherapy: No side-effects or adverse reactions reported. Compliance: No problems identified. Effectiveness: Clinically acceptable.  Dewayne Shorter, RN  02/17/2021  2:07 PM  Signed Nursing Pain Medication Assessment:  Safety precautions to be maintained throughout the outpatient stay will include: orient to surroundings, keep bed in low position,  maintain call bell within reach at all times, provide assistance with transfer out of bed and ambulation.  Medication Inspection Compliance: Pill count conducted under aseptic conditions, in front of  the patient. Neither the pills nor the bottle was removed from the patient's sight at any time. Once count was completed pills were immediately returned to the patient in their original bottle.  Medication: Hydrocodone/APAP Pill/Patch Count: 44 of 180 pills remain Pill/Patch Appearance: Markings consistent with prescribed medication Bottle Appearance: Standard pharmacy container. Clearly labeled. Filled Date: 05 / 16 / 2022 Last Medication intake:  Today    UDS:  Summary  Date Value Ref Range Status  04/22/2020 Note  Final    Comment:    ==================================================================== Tox[employer] Select 13 (MW) ==================================================================== Test                             Result       Flag       Units  Drug Present and Declared for Prescription Verification   Hydrocodone                    5091         EXPECTED   ng/mg creat   Hydromorphone                  346          EXPECTED   ng/mg creat   Dihydrocodeine                 1514         EXPECTED   ng/mg creat   Norhydrocodone                 5204         EXPECTED   ng/mg creat    Sources of hydrocodone include scheduled prescription medications.    Hydromorphone, dihydrocodeine and norhydrocodone are expected    metabolites of hydrocodone. Hydromorphone and dihydrocodeine are    also available as scheduled prescription medications.  ==================================================================== Test                      Result    Flag   Units      Ref Range   Creatinine              57               mg/dL      >=20 ==================================================================== Declared Medications:  The flagging and interpretation on this report are based on the   following declared medications.  Unexpected results may arise from  inaccuracies in the declared medications.   **Note: The testing scope of this panel includes these medications:   Hydrocodone (Norco)   **Note: The testing scope of this panel does not include the  following reported medications:   Acetaminophen (Norco)  Albuterol (Ventolin HFA)  Amitriptyline (Elavil)  Cyanocobalamin  Cyclobenzaprine (Flexeril)  Dicyclomine (Bentyl)  Estradiol (Estrace)  Fluticasone (Flonase)  Gemfibrozil (Lopid)  Magnesium (Magonate)  Melatonin  Metronidazole (Flagyl)  Montelukast (Singulair)  Omeprazole (Prilosec)  Pregabalin (Lyrica)  Propranolol (Inderal)  Salmeterol  Umeclidinium (Incruse Ellipta)  Vitamin D3  Zolpidem (Ambien) ==================================================================== For clinical consultation, please call [phone]. ====================================================================      ROS  Constitutional: Denies any fever or chills Gastrointestinal: No reported hemesis, hematochezia, vomiting, or acute GI distress Musculoskeletal: Denies any acute onset joint swelling, redness, loss of ROM, or weakness Neurological: No reported episodes of acute onset apraxia, aphasia, dysarthria, agnosia, amnesia, paralysis, loss of coordination, or loss of consciousness  Medication Review  HYDROcodone-acetaminophen, Melatonin, Vitamin D3,  albuterol, amitriptyline, amoxicillin, cyanocobalamin, cyclobenzaprine, dicyclomine, estradiol, fluticasone, magnesium gluconate, montelukast, omeprazole, pregabalin, propranolol, rosuvastatin, salmeterol, umeclidinium bromide, and zolpidem  History Review  Allergy: Zoe Sanchez is allergic to prednisone. Drug: Zoe Sanchez  reports no history of drug use. Alcohol:  reports no history of alcohol use. Tobacco:  reports that she has never smoked. She has never used smokeless tobacco. Social: Zoe Sanchez  reports that she has  never smoked. She has never used smokeless tobacco. She reports that she does not drink alcohol and does not use drugs. Medical:  has a past medical history of Abnormal heart rhythm, Abnormal mammogram (02/25/2014), Actinic keratosis, Anemia, Anxiety, Asthma, Awareness of heartbeats (06/29/2015), Basal cell carcinoma (04/24/2008), Basal cell carcinoma (04/09/2009), Basal cell carcinoma (BCC) (12/23/2014), Breath shortness (06/29/2015), CAD (coronary artery disease), Cancer (HCC), Cervical radicular pain (Location of Secondary source of pain) (Bilateral) (R>L) (C8 Dermatome) (06/29/2015), Chronic pain, COPD (chronic obstructive pulmonary disease) ([place]), DDD (degenerative disc disease), cervical, Degenerative disk disease, Depression, Dysrhythmia, Endometriosis, Family history of chronic pain (06/29/2015), Fibromyalgia, Fibromyalgia, GERD (gastroesophageal reflux disease), Heart murmur, Hiatal hernia, History of attempted suicide (06/29/2015), History of hiatal hernia (06/29/2015), History of psychiatric disorder (06/29/2015), History of suicidal ideation (06/29/2015), Hyperlipidemia, Insomnia, Insomnia, Irritable bowel syndrome, Squamous cell carcinoma of skin (03/08/2007), 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 Surgical: Zoe Sanchez  has a past surgical history that includes Abdominal hysterectomy (1986); Tonsillectomy (1957); Skin lesion excision; Oophorectomy (1986); Breast surgery; ORIF ankle fracture (Left, 03/14/2018); ORIF ankle fracture (Left, 07/26/2018); Ankle surgery; Colonoscopy with propofol (N/A, 06/05/2019); Breast biopsy (Right, 2016); Fracture surgery; and Colonoscopy with propofol (N/A, 03/05/2020). Family: family history includes Breast cancer in her maternal aunt; Heart disease in her father; Hypertension in her father and mother.  Laboratory Chemistry Profile   Renal Lab Results  Component Value Date   BUN 21 04/26/2019   CREATININE  0.84 04/26/2019   GFRAA >60 04/26/2019   GFRNONAA >60 04/26/2019     Hepatic Lab Results  Component Value Date   AST 35 04/26/2019   ALT 22 04/26/2019   ALBUMIN 4.1 04/26/2019   ALKPHOS 71 04/26/2019     Electrolytes Lab Results  Component Value Date   NA 135 04/26/2019   K 4.5 04/26/2019   CL 103 04/26/2019   CALCIUM 9.4 04/26/2019   MG 1.8 04/26/2019     Bone Lab Results  Component Value Date   VD25OH 40.8 04/26/2019   25OHVITD1 49 03/09/2016   25OHVITD2 <1.0 03/09/2016   25OHVITD3 48 03/09/2016     Inflammation (CRP: Acute Phase) (ESR: Chronic Phase) Lab Results  Component Value Date   CRP 2.2 (H) 04/26/2019   ESRSEDRATE 39 (H) 04/26/2019       Note: Above Lab results reviewed.  Recent Imaging Review  MM 3D SCREEN BREAST BILATERAL CLINICAL DATA:  Screening.  EXAM: DIGITAL SCREENING BILATERAL MAMMOGRAM WITH TOMO AND CAD  COMPARISON:  Previous exam(s).  ACR Breast Density Category c: The breast tissue is heterogeneously dense, which may obscure small masses.  FINDINGS: There are no findings suspicious for malignancy. Images were processed with CAD.  IMPRESSION: No mammographic evidence of malignancy. A result letter of this screening mammogram will be mailed directly to the patient.  RECOMMENDATION: Screening mammogram in one year. (Code:SM-B-01Y)  BI-RADS CATEGORY  1: Negative.  Electronically Signed   By: Margarette Canada M.D.   On: 06/25/2019 11:04 Note: Reviewed        Physical Exam  General  appearance: Well nourished, well developed, and well hydrated. In no apparent acute distress Mental status: Alert, oriented x 3 (person, place, & time)       Respiratory: No evidence of acute respiratory distress Eyes: PERLA Vitals: BP 140/70   Pulse 76   Temp (!) 97.2 F (36.2 C)   Resp 18   Ht '5\' 3"'  (1.6 m)   Wt 155 lb (70.3 kg)   SpO2 98%   BMI 27.46 kg/m  BMI: Estimated body mass index is 27.46 kg/m as calculated from the following:    Height as of this encounter: '5\' 3"'  (1.6 m).   Weight as of this encounter: 155 lb (70.3 kg). Ideal: Ideal body weight: 52.4 kg (115 lb 8.3 oz) Adjusted ideal body weight: 59.6 kg (131 lb 5 oz)  Assessment   Status Diagnosis  Controlled Controlled Controlled 1. Chronic pain syndrome   2. Chronic neck pain (1ry area of Pain) (Bilateral) (R>L)   3. Chronic upper extremity pain (2ry area of Pain) (Bilateral) (R>L)   4. Chronic low back pain (3ry area of Pain) (Bilateral) (R>L)   5. Fibromyalgia   6. L4-5 Lumbar Facet joint Synovial cyst   7. Pharmacologic therapy   8. Chronic use of opiate for therapeutic purpose      Updated Problems: Problem  Closed Fracture of Fifth Metatarsal Bone  Chronic Use of Opiate for Therapeutic Purpose    Plan of Care  Problem-specific:  No problem-specific Assessment & Plan notes found for this encounter.  Zoe Sanchez has a current medication list which includes the following long-term medication(s): albuterol, amitriptyline, vitamin d3, cyanocobalamin, cyclobenzaprine, dicyclomine, estradiol, fluticasone, [START ON 02/23/2021] hydrocodone-acetaminophen, [START ON 03/25/2021] hydrocodone-acetaminophen, [START ON 04/24/2021] hydrocodone-acetaminophen, magnesium gluconate, melatonin, montelukast, omeprazole, pregabalin, propranolol, salmeterol, and rosuvastatin.  Pharmacotherapy (Medications Ordered): Meds ordered this encounter  Medications  . HYDROcodone-acetaminophen (NORCO/VICODIN) 5-325 MG tablet    Sig: Take 1 tablet by mouth every 4 (four) hours as needed for severe pain. Must last 30 days    Dispense:  180 tablet    Refill:  0    Not a duplicate. Do NOT delete! Dispense 1 day early if closed on refill date. Avoid benzodiazepines within 8 hours of opioids. Do not send refill requests.  Marland Kitchen HYDROcodone-acetaminophen (NORCO/VICODIN) 5-325 MG tablet    Sig: Take 1 tablet by mouth every 4 (four) hours as needed for severe pain. Must last 30 days     Dispense:  180 tablet    Refill:  0    Not a duplicate. Do NOT delete! Dispense 1 day early if closed on refill date. Avoid benzodiazepines within 8 hours of opioids. Do not send refill requests.  Marland Kitchen HYDROcodone-acetaminophen (NORCO/VICODIN) 5-325 MG tablet    Sig: Take 1 tablet by mouth every 4 (four) hours as needed for severe pain. Must last 30 days    Dispense:  180 tablet    Refill:  0    Not a duplicate. Do NOT delete! Dispense 1 day early if closed on refill date. Avoid benzodiazepines within 8 hours of opioids. Do not send refill requests.   Orders:  Orders Placed This Encounter  Procedures  . ToxASSURE Select 13 (MW), Urine    Volume: 30 ml(s). Minimum 3 ml of urine is needed. Document temperature of fresh sample. Indications: Long term (current) use of opiate analgesic (E52.778)    Order Specific Question:   Release to patient    Answer:   Immediate   Follow-up plan:  Return in about 3 months (around 05/24/2021) for evaluation day (F2F) (MM).      Interventional management options:  Considering:   Diagnostic bilateral cervical facetblock  Possible bilateral cervical facet RFA  Diagnostic right CESI  Diagnostic bilateral IA shoulder jointinjection  Diagnostic bilateral suprascapular NB  Possible bilateral suprascapular nerve RFA  Possible bilateral lumbar facet RFA  Diagnostic left sided carpal tunnel injection    Palliative PRN treatment(s):   Diagnostic bilateral lumbar facetblock #3 (w/o Steroids)      Recent Visits Date Type Provider Dept  11/25/20 Office Visit Milinda Pointer, MD [facility]  Showing recent visits within past 90 days and meeting all other requirements Today's Visits Date Type Provider Dept  02/17/21 Office Visit Milinda Pointer, MD [facility]  Showing today's visits and meeting all other requirements Future Appointments No visits were found meeting these conditions. Showing future appointments within next 90  days and meeting all other requirements  I discussed the assessment and treatment plan with the patient. The patient was provided an opportunity to ask questions and all were answered. The patient agreed with the plan and demonstrated an understanding of the instructions.  Patient advised to call back or seek an in-person evaluation if the symptoms or condition worsens.  Duration of encounter: 30 minutes.  Note by: Gaspar Cola, MD Date: 02/17/2021; Time: 2:26 PM

## 2021-02-17 ENCOUNTER — Ambulatory Visit: Payer: Medicare Other | Attending: Pain Medicine | Admitting: Pain Medicine

## 2021-02-17 ENCOUNTER — Encounter: Payer: Self-pay | Admitting: Pain Medicine

## 2021-02-17 ENCOUNTER — Other Ambulatory Visit: Payer: Self-pay

## 2021-02-17 VITALS — BP 140/70 | HR 76 | Temp 97.2°F | Resp 18 | Ht 63.0 in | Wt 155.0 lb

## 2021-02-17 DIAGNOSIS — M797 Fibromyalgia: Secondary | ICD-10-CM

## 2021-02-17 DIAGNOSIS — Z79899 Other long term (current) drug therapy: Secondary | ICD-10-CM | POA: Diagnosis present

## 2021-02-17 DIAGNOSIS — M7138 Other bursal cyst, other site: Secondary | ICD-10-CM | POA: Diagnosis present

## 2021-02-17 DIAGNOSIS — G894 Chronic pain syndrome: Secondary | ICD-10-CM | POA: Diagnosis not present

## 2021-02-17 DIAGNOSIS — M542 Cervicalgia: Secondary | ICD-10-CM | POA: Insufficient documentation

## 2021-02-17 DIAGNOSIS — M79601 Pain in right arm: Secondary | ICD-10-CM | POA: Insufficient documentation

## 2021-02-17 DIAGNOSIS — G8929 Other chronic pain: Secondary | ICD-10-CM | POA: Insufficient documentation

## 2021-02-17 DIAGNOSIS — M79602 Pain in left arm: Secondary | ICD-10-CM | POA: Insufficient documentation

## 2021-02-17 DIAGNOSIS — Z79891 Long term (current) use of opiate analgesic: Secondary | ICD-10-CM | POA: Diagnosis present

## 2021-02-17 DIAGNOSIS — M5442 Lumbago with sciatica, left side: Secondary | ICD-10-CM | POA: Insufficient documentation

## 2021-02-17 DIAGNOSIS — M5441 Lumbago with sciatica, right side: Secondary | ICD-10-CM | POA: Diagnosis present

## 2021-02-17 MED ORDER — HYDROCODONE-ACETAMINOPHEN 5-325 MG PO TABS: 1.0000 | ORAL_TABLET | ORAL | 0 refills | Status: DC | PRN

## 2021-02-17 NOTE — Progress Notes (Signed)
Nursing Pain Medication Assessment:  Safety precautions to be maintained throughout the outpatient stay will include: orient to surroundings, keep bed in low position, maintain call bell within reach at all times, provide assistance with transfer out of bed and ambulation.  Medication Inspection Compliance: Pill count conducted under aseptic conditions, in front of the patient. Neither the pills nor the bottle was removed from the patient's sight at any time. Once count was completed pills were immediately returned to the patient in their original bottle.  Medication: Hydrocodone/APAP Pill/Patch Count: 44 of 180 pills remain Pill/Patch Appearance: Markings consistent with prescribed medication Bottle Appearance: Standard pharmacy container. Clearly labeled. Filled Date: 05 / 16 / 2022 Last Medication intake:  Today

## 2021-02-17 NOTE — Patient Instructions (Signed)
____________________________________________________________________________________________  Medication Rules  Purpose: To inform patients, and their family members, of our rules and regulations.  Applies to: All patients receiving prescriptions (written or electronic).  Pharmacy of record: Pharmacy where electronic prescriptions will be sent. If written prescriptions are taken to a different pharmacy, please inform the nursing staff. The pharmacy listed in the electronic medical record should be the one where you would like electronic prescriptions to be sent.  Electronic prescriptions: In compliance with the [place] Strengthen Opioid Misuse Prevention (STOP) Act of 2017 (Session Law 2017-74/H243), effective September 12, 2018, all controlled substances must be electronically prescribed. Calling prescriptions to the pharmacy will cease to exist.  Prescription refills: Only during scheduled appointments. Applies to all prescriptions.  NOTE: The following applies primarily to controlled substances (Opioid* Pain Medications).   Type of encounter (visit): For patients receiving controlled substances, face-to-face visits are required. (Not an option or up to the patient.)  Patient's responsibilities: 1. Pain Pills: Bring all pain pills to every appointment (except for procedure appointments). 2. Pill Bottles: Bring pills in original pharmacy bottle. Always bring the newest bottle. Bring bottle, even if empty. 3. Medication refills: You are responsible for knowing and keeping track of what medications you take and those you need refilled. The day before your appointment: write a list of all prescriptions that need to be refilled. The day of the appointment: give the list to the admitting nurse. Prescriptions will be written only during appointments. No prescriptions will be written on procedure days. If you forget a medication: it will not be "Called in", "Faxed", or "electronically sent".  You will need to get another appointment to get these prescribed. No early refills. Do not call asking to have your prescription filled early. 4. Prescription Accuracy: You are responsible for carefully inspecting your prescriptions before leaving our office. Have the discharge nurse carefully go over each prescription with you, before taking them home. Make sure that your name is accurately spelled, that your address is correct. Check the name and dose of your medication to make sure it is accurate. Check the number of pills, and the written instructions to make sure they are clear and accurate. Make sure that you are given enough medication to last until your next medication refill appointment. 5. Taking Medication: Take medication as prescribed. When it comes to controlled substances, taking less pills or less frequently than prescribed is permitted and encouraged. Never take more pills than instructed. Never take medication more frequently than prescribed.  6. Inform other Doctors: Always inform, all of your healthcare providers, of all the medications you take. 7. Pain Medication from other Providers: You are not allowed to accept any additional pain medication from any other Doctor or Healthcare provider. There are two exceptions to this rule. (see below) In the event that you require additional pain medication, you are responsible for notifying us, as stated below. 8. Cough Medicine: Often these contain an opioid, such as codeine or hydrocodone. Never accept or take cough medicine containing these opioids if you are already taking an opioid* medication. The combination may cause respiratory failure and death. 9. Medication Agreement: You are responsible for carefully reading and following our Medication Agreement. This must be signed before receiving any prescriptions from our practice. Safely store a copy of your signed Agreement. Violations to the Agreement will result in no further prescriptions.  (Additional copies of our Medication Agreement are available upon request.) 10. Laws, Rules, & Regulations: All patients are expected to follow all   Federal and State Laws, Statutes, Rules, & Regulations. Ignorance of the Laws does not constitute a valid excuse.  11. Illegal drugs and Controlled Substances: The use of illegal substances (including, but not limited to marijuana and its derivatives) and/or the illegal use of any controlled substances is strictly prohibited. Violation of this rule may result in the immediate and permanent discontinuation of any and all prescriptions being written by our practice. The use of any illegal substances is prohibited. 12. Adopted CDC guidelines & recommendations: Target dosing levels will be at or below 60 MME/day. Use of benzodiazepines** is not recommended.  Exceptions: There are only two exceptions to the rule of not receiving pain medications from other Healthcare Providers. 1. Exception #1 (Emergencies): In the event of an emergency (i.e.: accident requiring emergency care), you are allowed to receive additional pain medication. However, you are responsible for: As soon as you are able, call our office [phone], at any time of the day or night, and leave a message stating your name, the date and nature of the emergency, and the name and dose of the medication prescribed. In the event that your call is answered by a member of our staff, make sure to document and save the date, time, and the name of the person that took your information.  2. Exception #2 (Planned Surgery): In the event that you are scheduled by another doctor or dentist to have any type of surgery or procedure, you are allowed (for a period no longer than 30 days), to receive additional pain medication, for the acute post-op pain. However, in this case, you are responsible for picking up a copy of our "Post-op Pain Management for Surgeons" handout, and giving it to your surgeon or dentist. This  document is available at our office, and does not require an appointment to obtain it. Simply go to our office during business hours (Monday-Thursday from 8:00 AM to 4:00 PM) (Friday 8:00 AM to 12:00 Noon) or if you have a scheduled appointment with us, prior to your surgery, and ask for it by name. In addition, you are responsible for: calling our office [phone], at any time of the day or night, and leaving a message stating your name, name of your surgeon, type of surgery, and date of procedure or surgery. Failure to comply with your responsibilities may result in termination of therapy involving the controlled substances.  *Opioid medications include: morphine, codeine, oxycodone, oxymorphone, hydrocodone, hydromorphone, meperidine, tramadol, tapentadol, buprenorphine, fentanyl, methadone. **Benzodiazepine medications include: diazepam (Valium), alprazolam (Xanax), clonazepam (Klonopine), lorazepam (Ativan), clorazepate (Tranxene), chlordiazepoxide (Librium), estazolam (Prosom), oxazepam (Serax), temazepam (Restoril), triazolam (Halcion) (Last updated: 08/10/2020) ____________________________________________________________________________________________   ____________________________________________________________________________________________  Medication Recommendations and Reminders  Applies to: All patients receiving prescriptions (written and/or electronic).  Medication Rules & Regulations: These rules and regulations exist for your safety and that of others. They are not flexible and neither are we. Dismissing or ignoring them will be considered "non-compliance" with medication therapy, resulting in complete and irreversible termination of such therapy. (See document titled "Medication Rules" for more details.) In all conscience, because of safety reasons, we cannot continue providing a therapy where the patient does not follow instructions.  Pharmacy of record:   Definition:  This is the pharmacy where your electronic prescriptions will be sent.   We do not endorse any particular pharmacy, however, we have experienced problems with [facility] not securing enough medication supply for the community.  We do not restrict you in your choice of pharmacy. However,   once we write for your prescriptions, we will NOT be re-sending more prescriptions to fix restricted supply problems created by your pharmacy, or your insurance.   The pharmacy listed in the electronic medical record should be the one where you want electronic prescriptions to be sent.  If you choose to change pharmacy, simply notify our nursing staff.  Recommendations:  Keep all of your pain medications in a safe place, under lock and key, even if you live alone. We will NOT replace lost, stolen, or damaged medication.  After you fill your prescription, take 1 week's worth of pills and put them away in a safe place. You should keep a separate, properly labeled bottle for this purpose. The remainder should be kept in the original bottle. Use this as your primary supply, until it runs out. Once it's gone, then you know that you have 1 week's worth of medicine, and it is time to come in for a prescription refill. If you do this correctly, it is unlikely that you will ever run out of medicine.  To make sure that the above recommendation works, it is very important that you make sure your medication refill appointments are scheduled at least 1 week before you run out of medicine. To do this in an effective manner, make sure that you do not leave the office without scheduling your next medication management appointment. Always ask the nursing staff to show you in your prescription , when your medication will be running out. Then arrange for the receptionist to get you a return appointment, at least 7 days before you run out of medicine. Do not wait until you have 1 or 2 pills left, to come in. This is very poor planning and  does not take into consideration that we may need to cancel appointments due to bad weather, sickness, or emergencies affecting our staff.  DO NOT ACCEPT A "Partial Fill": If for any reason your pharmacy does not have enough pills/tablets to completely fill or refill your prescription, do not allow for a "partial fill". The law allows the pharmacy to complete that prescription within 72 hours, without requiring a new prescription. If they do not fill the rest of your prescription within those 72 hours, you will need a separate prescription to fill the remaining amount, which we will NOT provide. If the reason for the partial fill is your insurance, you will need to talk to the pharmacist about payment alternatives for the remaining tablets, but again, DO NOT ACCEPT A PARTIAL FILL, unless you can trust your pharmacist to obtain the remainder of the pills within 72 hours.  Prescription refills and/or changes in medication(s):   Prescription refills, and/or changes in dose or medication, will be conducted only during scheduled medication management appointments. (Applies to both, written and electronic prescriptions.)  No refills on procedure days. No medication will be changed or started on procedure days. No changes, adjustments, and/or refills will be conducted on a procedure day. Doing so will interfere with the diagnostic portion of the procedure.  No phone refills. No medications will be "called into the pharmacy".  No Fax refills.  No weekend refills.  No Holliday refills.  No after hours refills.  Remember:  Business hours are:  Monday to Thursday 8:00 AM to 4:00 PM Provider's Schedule: Myriam Brandhorst, MD - Appointments are:  Medication management: Monday and Wednesday 8:00 AM to 4:00 PM Procedure day: Tuesday and Thursday 7:30 AM to 4:00 PM Bilal Lateef, MD - Appointments are:    Medication management: Tuesday and Thursday 8:00 AM to 4:00 PM Procedure day: Monday and Wednesday  7:30 AM to 4:00 PM (Last update: 04/01/2020) ____________________________________________________________________________________________   ____________________________________________________________________________________________  CBD (cannabidiol) WARNING  Applicable to: All individuals currently taking or considering taking CBD (cannabidiol) and, more important, all patients taking opioid analgesic controlled substances (pain medication). (Example: oxycodone; oxymorphone; hydrocodone; hydromorphone; morphine; methadone; tramadol; tapentadol; fentanyl; buprenorphine; butorphanol; dextromethorphan; meperidine; codeine; etc.)  Legal status: CBD remains a Schedule I drug prohibited for any use. CBD is illegal with one exception. In the [place], CBD has a limited Food and Drug Administration (FDA) approval for the treatment of two specific types of epilepsy disorders. Only one CBD product has been approved by the FDA for this purpose: "Epidiolex". FDA is aware that some companies are marketing products containing cannabis and cannabis-derived compounds in ways that violate the Federal Food, Drug and Cosmetic Act (FD&C Act) and that may put the health and safety of consumers at risk. The FDA, a Federal agency, has not enforced the CBD status since 2018.   Legality: Some manufacturers ship CBD products nationally, which is illegal. Often such products are sold online and are therefore available throughout the country. CBD is openly sold in head shops and health food stores in some states where such sales have not been explicitly legalized. Selling unapproved products with unsubstantiated therapeutic claims is not only a violation of the law, but also can put patients at risk, as these products have not been proven to be safe or effective. Federal illegality makes it difficult to conduct research on CBD.  Reference: "FDA Regulation of Cannabis and Cannabis-Derived Products, Including Cannabidiol  (CBD)" - https://www.fda.gov/news-events/public-health-focus/fda-regulation-cannabis-and-cannabis-derived-products-including-cannabidiol-cbd  Warning: CBD is not FDA approved and has not undergo the same manufacturing controls as prescription drugs.  This means that the purity and safety of available CBD may be questionable. Most of the time, despite manufacturer's claims, it is contaminated with THC (delta-9-tetrahydrocannabinol - the chemical in marijuana responsible for the "HIGH").  When this is the case, the THC contaminant will trigger a positive urine drug screen (UDS) test for Marijuana (carboxy-THC). Because a positive UDS for any illicit substance is a violation of our medication agreement, your opioid analgesics (pain medicine) may be permanently discontinued.  MORE ABOUT CBD  General Information: CBD  is a derivative of the Marijuana (cannabis sativa) plant discovered in 1940. It is one of the 113 identified substances found in Marijuana. It accounts for up to 40% of the plant's extract. As of 2018, preliminary clinical studies on CBD included research for the treatment of anxiety, movement disorders, and pain. CBD is available and consumed in multiple forms, including inhalation of smoke or vapor, as an aerosol spray, and by mouth. It may be supplied as an oil containing CBD, capsules, dried cannabis, or as a liquid solution. CBD is thought not to be as psychoactive as THC (delta-9-tetrahydrocannabinol - the chemical in marijuana responsible for the "HIGH"). Studies suggest that CBD may interact with different biological target receptors in the body, including cannabinoid and other neurotransmitter receptors. As of 2018 the mechanism of action for its biological effects has not been determined.  Side-effects  Adverse reactions: Dry mouth, diarrhea, decreased appetite, fatigue, drowsiness, malaise, weakness, sleep disturbances, and others.  Drug interactions: CBC may interact with other  medications such as blood-thinners. (Last update: 04/18/2020) ____________________________________________________________________________________________   ____________________________________________________________________________________________  Drug Holidays (Slow)  What is a "Drug Holiday"? Drug Holiday: is the name given to the period of time during   which a patient stops taking a medication(s) for the purpose of eliminating tolerance to the drug.  Benefits . Improved effectiveness of opioids. . Decreased opioid dose needed to achieve benefits. . Improved pain with lesser dose.  What is tolerance? Tolerance: is the progressive decreased in effectiveness of a drug due to its repetitive use. With repetitive use, the body gets use to the medication and as a consequence, it loses its effectiveness. This is a common problem seen with opioid pain medications. As a result, a larger dose of the drug is needed to achieve the same effect that used to be obtained with a smaller dose.  How long should a "Drug Holiday" last? You should stay off of the pain medicine for at least 14 consecutive days. (2 weeks)  Should I stop the medicine "cold turkey"? No. You should always coordinate with your Pain Specialist so that he/she can provide you with the correct medication dose to make the transition as smoothly as possible.  How do I stop the medicine? Slowly. You will be instructed to decrease the daily amount of pills that you take by one (1) pill every seven (7) days. This is called a "slow downward taper" of your dose. For example: if you normally take four (4) pills per day, you will be asked to drop this dose to three (3) pills per day for seven (7) days, then to two (2) pills per day for seven (7) days, then to one (1) per day for seven (7) days, and at the end of those last seven (7) days, this is when the "Drug Holiday" would start.   Will I have withdrawals? By doing a "slow downward  taper" like this one, it is unlikely that you will experience any significant withdrawal symptoms. Typically, what triggers withdrawals is the sudden stop of a high dose opioid therapy. Withdrawals can usually be avoided by slowly decreasing the dose over a prolonged period of time. If you do not follow these instructions and decide to stop your medication abruptly, withdrawals may be possible.  What are withdrawals? Withdrawals: refers to the wide range of symptoms that occur after stopping or dramatically reducing opiate drugs after heavy and prolonged use. Withdrawal symptoms do not occur to patients that use low dose opioids, or those who take the medication sporadically. Contrary to benzodiazepine (example: Valium, Xanax, etc.) or alcohol withdrawals ("Delirium Tremens"), opioid withdrawals are not lethal. Withdrawals are the physical manifestation of the body getting rid of the excess receptors.  Expected Symptoms Early symptoms of withdrawal may include: . Agitation . Anxiety . Muscle aches . Increased tearing . Insomnia . Runny nose . Sweating . Yawning  Late symptoms of withdrawal may include: . Abdominal cramping . Diarrhea . Dilated pupils . Goose bumps . Nausea . Vomiting  Will I experience withdrawals? Due to the slow nature of the taper, it is very unlikely that you will experience any.  What is a slow taper? Taper: refers to the gradual decrease in dose.  (Last update: 04/01/2020) ____________________________________________________________________________________________     

## 2021-02-24 LAB — TOXASSURE SELECT 13 (MW), URINE

## 2021-04-05 ENCOUNTER — Ambulatory Visit (INDEPENDENT_AMBULATORY_CARE_PROVIDER_SITE_OTHER): Payer: Medicare Other | Admitting: Dermatology

## 2021-04-05 DIAGNOSIS — L439 Lichen planus, unspecified: Secondary | ICD-10-CM

## 2021-04-05 MED ORDER — AMOXICILLIN 250 MG PO CAPS: 250.0000 mg | ORAL_CAPSULE | Freq: Two times a day (BID) | ORAL | 3 refills | Status: DC

## 2021-04-05 NOTE — Patient Instructions (Signed)

## 2021-04-05 NOTE — Progress Notes (Signed)
   Follow-Up Visit   Subjective  Zoe Sanchez is a 65 y.o. female who presents for the following: Follow-up (3 month follow up for lichen planus for bilateral legs, chest, and bilateral arms. Patient reports she still has active areas on legs. ).  She was previously controlled on oral metronidazole.  She discontinued this in the past and only found that she cleared her lichen planus when she was on some oral amoxicillin for a lung infection per her PCP.  In her on her last visit she negotiated to restart the amoxicillin for treatment of her lichen planus.  We ordered amoxicillin 250 daily.  She says she took it twice a day and did well but when she dropped down to once a day she has not done as well.  She would like to try to go to twice a day with it.  The following portions of the chart were reviewed this encounter and updated as appropriate:  Tobacco  Allergies  Meds  Problems  Med Hx  Surg Hx  Fam Hx      Objective  Well appearing patient in no apparent distress; mood and affect are within normal limits.  A focused examination was performed including bilateral arms, bilateral legs, chest, back. Relevant physical exam findings are noted in the Assessment and Plan.  Right Forearm - Anterior Patches on legs and arms and 1 on back   Assessment & Plan   Lichen planus -  Pt improved significantly while on oral amoxicillin recently for an infection.  She would like to take this for controlling her lichen planus. Chronic and persistent. Severe and generalized Bx proven   Start Amoxicillian '250mg'$  1 po bid. advised the patient that I do not like the idea of having on her on long-term amoxicillin, but we can at least temporarily have her on this to see how she does.  She agrees that she does not want to be on long-term. Cont Cln wash qd    Photos taken today   amoxicillin (AMOXIL) 250 MG capsule - Right Forearm - Anterior Take 1 capsule (250 mg total) by mouth 2 (two) times  daily.  Return for 2 - 3 month follow up on lichen planus .  IRuthell Rummage, CMA, am acting as scribe for Sarina Ser, MD. Documentation: I have reviewed the above documentation for accuracy and completeness, and I agree with the above.  Sarina Ser, MD

## 2021-04-06 ENCOUNTER — Encounter: Payer: Self-pay | Admitting: Dermatology

## 2021-05-24 ENCOUNTER — Encounter: Payer: Medicare Other | Admitting: Pain Medicine

## 2021-05-24 NOTE — Progress Notes (Signed)
PROVIDER NOTE: Information contained herein reflects review and annotations entered in association with encounter. Interpretation of such information and data should be left to medically-trained personnel. Information provided to patient can be located elsewhere in the medical record under "Patient Instructions". Document created using STT-dictation technology, any transcriptional errors that may result from process are unintentional.    Patient: Zoe Sanchez  Service Category: E/M  Provider: Gaspar Cola, MD  DOB: 09-13-1955  DOS: 05/25/2021  Specialty: Interventional Pain Management  MRN: [id number]  Setting: Ambulatory outpatient  PCP: Zoe Lank, MD  Type: Established Patient    Referring Provider: Denton Lank, MD  Location: Office  Delivery: Face-to-face     HPI  [name], a 65 y.o. year old female, is here today because of her Chronic bilateral low back pain with bilateral sciatica [M54.42, M54.41, G89.29]. [name] primary complain today is Neck Pain (Right is the worse ), Back Pain (Lumbar bilateral ), and Foot Pain (Bilateral ) Last encounter: My last encounter with her was on 02/17/2021. Pertinent problems: [name] has Myofascial pain syndrome; Cervical facet syndrome (Bilateral) (R>L); Chronic neck pain (4th area of Pain) (Bilateral) (R>L); Chronic pain syndrome; Cervical spondylosis; Fibromyalgia; Cervical herniated disc (C5-6 and C6-7); Cervical foraminal stenosis (Bilateral) (C5-6); Chronic cervical radicular pain (Bilateral) (R>L) (C8 Dermatome); Chronic low back pain (1ry area of Pain) (Bilateral) (L>R) w/ sciatica (Bilateral) (L>R); Spondylosis of lumbar spine; Family history of chronic pain; Carpal tunnel syndrome (Left); Chronic shoulder pain (Bilateral); Cervical facet arthropathy; Lumbar facet arthropathy; Bimalleolar fracture of left ankle; Lumbar facet syndrome (Bilateral) (R>L); Chronic upper extremity pain (5th area of Pain) (Bilateral) (R>L); L4-5  Lumbar Facet joint Synovial cyst; Chronic lower extremity pain (2ry area of Pain) (Bilateral) (L>R); Chronic lumbar radicular pain (Bilateral) (S1); Arthropathy of lumbar facet joint; Cervical syndrome; Lumbar spondylosis; Synovial cyst of lumbar spine; Prolapsed cervical intervertebral disc; Lumbar facet joint pain; Cervical radiculopathy; Stenosis of intervertebral foramina; Chronic hip pain (3ry area of Pain) (Bilateral); Shoulder pain; Chronic musculoskeletal pain; Neurogenic pain; Closed trimalleolar fracture of left ankle; Bilateral lower extremity edema; Chronic pain of multiple sites; Closed fracture of fifth metatarsal bone; Lumbosacral sensory radiculopathy at S1 (Bilateral); Decreased range of motion of lumbar spine; and Numbness and tingling of lower extremity (Bilateral) on their pertinent problem list. Pain Assessment: Severity of Chronic pain is reported as a 2 /10. Location: Neck (lumbar bilateral) Left, Right/neck pain into both arms, headaches as well.  lumbar goes into hips and legs.  feels that is very severe and patient is requesting MRI.Marland Kitchen Onset:  . Quality: Discomfort, Constant, Tingling, Numbness, Sharp. Timing:  . Modifying factor(s): rest, elevating legs and medications.. Vitals:  height is '5\' 3"'  (1.6 m) and weight is 155 lb (70.3 kg). Her temporal temperature is 97.2 F (36.2 C) (abnormal). Her blood pressure is 105/53 (abnormal) and her pulse is 70. Her respiration is 16 and oxygen saturation is 98%.   Reason for encounter: medication management.   The patient indicates doing well with the current medication regimen. No adverse reactions or side effects reported to the medications.   Today the patient indicates worsening symptoms for low back pain.  She indicates that she think it has to do with the fall that she experienced approximately 4 years ago.  Today she indicates that her low back pain is worse than the lower extremity pain.  The low back pain is bilateral with the left  side being worse.  She denies any prior back  surgeries, nerve blocks, or any imaging or physical therapy in the last 2 years.  In terms of her lower extremity pain it seems to be bilateral with the left being worse than the right.  In the case of the left lower extremity pain goes all the way down into the bottom of her foot and what seems to be an S1 dermatomal distribution.  The patient also indicates having the exact same pattern on the right side, but not as bad.  She refers having pain, numbness, and weakness affecting all of her toes but her big toe seems to be the least affected.  She also indicated having had a left ankle surgery approximately 4 years ago.  With the low back pain being her current worst pain followed by of the lower extremity pain, she indicates that her third worst pain is that of the hips, bilaterally, with the left being worse than the right.  Following that she refers still having neck pain, but it has not worsened as bad as the low back has.  The pain in the cervical region is in the posterior aspect of the neck, bilaterally, and she was unable to tell me today if there was a side that was worse than the other.  She also indicated having bilateral upper extremity pain and numbness that seems to be worse on the left side when compared to the right.  She is right-handed.  In the case of the left upper extremity the pain seems to go all the way down into her middle, ring, and pinky fingers and what appears to be a C7/C8 dermatomal distribution.  She refers that in the case of the right upper extremity it follows the same pattern but occasionally he will also involve her thumb and index finger and what appears to be a C6 dermatomal distribution.  During today's physical exam the patient was able to heel walk and toe walk but she did seem to have some degree of foot drop on the right side.  The patient also demonstrated significant decreased range of motion of the lumbar spine with  almost 0 ability to hyperextend her lumbar spine.  She also had significant decreased range of motion on hyperextension on rotation bilaterally.  She seems to be limited by discomfort and pain, however she also indicated feeling unstable on her feet when she attempts to do any kind of range of motion of her lumbar spine.  Based on today's examination, I will be ordering an MRI of the lumbar spine and plain flexion-extension x-rays of the cervical spine.  The patient denies any bowel or bladder incontinence.  RTCB: 08/23/2021 Nonopioids transferred 08/24/2020: Magnesium, Flexeril, Lyrica, melatonin, vitamin B12, and vitamin D3.  Pharmacotherapy Assessment  Analgesic: Hydrocodone/APAP 5/325, 1 tab PO q 4 hrs (30 mg/day of hydrocodone)(1,950 mg/day of acetaminophen) MME/day: 30 mg/day.   Monitoring: [place] PMP: PDMP reviewed during this encounter.       Pharmacotherapy: No side-effects or adverse reactions reported. Compliance: No problems identified. Effectiveness: Clinically acceptable.  No notes on file  UDS:  Summary  Date Value Ref Range Status  02/17/2021 Note  Final    Comment:    ==================================================================== [employer] Select 13 (MW) ==================================================================== Test                             Result       Flag       Units  Drug Present and Declared for  Prescription Verification   Hydrocodone                    >6803        EXPECTED   ng/mg creat   Hydromorphone                  477          EXPECTED   ng/mg creat   Dihydrocodeine                 1059         EXPECTED   ng/mg creat   Norhydrocodone                 2802         EXPECTED   ng/mg creat    Sources of hydrocodone include scheduled prescription medications.    Hydromorphone, dihydrocodeine and norhydrocodone are expected    metabolites of hydrocodone. Hydromorphone and dihydrocodeine are    also available as scheduled prescription  medications.  Drug Present not Declared for Prescription Verification   Oxazepam                       18           UNEXPECTED ng/mg creat    Oxazepam may be administered as a scheduled prescription medication;    it is also an expected metabolite of other benzodiazepine drugs,    including diazepam, chlordiazepoxide, prazepam, clorazepate,    halazepam, and temazepam.  ==================================================================== Test                      Result    Flag   Units      Ref Range   Creatinine              147              mg/dL      >=20 ==================================================================== Declared Medications:  The flagging and interpretation on this report are based on the  following declared medications.  Unexpected results may arise from  inaccuracies in the declared medications.   **Note: The testing scope of this panel includes these medications:   Hydrocodone (Norco)   **Note: The testing scope of this panel does not include the  following reported medications:   Acetaminophen (Norco)  Albuterol  Amitriptyline (Elavil)  Amoxicillin  Cholecalciferol  Cyanocobalamin  Cyclobenzaprine (Flexeril)  Dicyclomine (Bentyl)  Estradiol  Fluticasone (Flonase)  Magnesium  Melatonin  Montelukast (Singulair)  Omeprazole  Pregabalin (Lyrica)  Propranolol  Rosuvastatin  Umeclidinium (Incruse Ellipta)  Zolpidem (Ambien) ==================================================================== For clinical consultation, please call [phone]. ====================================================================      ROS  Constitutional: Denies any fever or chills Gastrointestinal: No reported hemesis, hematochezia, vomiting, or acute GI distress Musculoskeletal: Denies any acute onset joint swelling, redness, loss of ROM, or weakness Neurological: No reported episodes of acute onset apraxia, aphasia, dysarthria, agnosia, amnesia, paralysis,  loss of coordination, or loss of consciousness  Medication Review  HYDROcodone-acetaminophen, Melatonin, Vitamin D3, albuterol, amitriptyline, amoxicillin, cyanocobalamin, cyclobenzaprine, dicyclomine, estradiol, fluticasone, magnesium gluconate, montelukast, omeprazole, pregabalin, propranolol, rosuvastatin, salmeterol, umeclidinium bromide, and zolpidem  History Review  Allergy: Ms. Murton is allergic to prednisone. Drug: Ms. Rallis  reports no history of drug use. Alcohol:  reports no history of alcohol use. Tobacco:  reports that she has never smoked. She has never used smokeless tobacco. Social: Ms. Anspach  reports that she has never smoked. She  has never used smokeless tobacco. She reports that she does not drink alcohol and does not use drugs. Medical:  has a past medical history of Abnormal heart rhythm, Abnormal mammogram (02/25/2014), Actinic keratosis, Anemia, Anxiety, Asthma, Awareness of heartbeats (06/29/2015), Basal cell carcinoma (04/24/2008), Basal cell carcinoma (04/09/2009), Basal cell carcinoma (BCC) (12/23/2014), Breath shortness (06/29/2015), CAD (coronary artery disease), Cancer (HCC), Cervical radicular pain (Location of Secondary source of pain) (Bilateral) (R>L) (C8 Dermatome) (06/29/2015), Chronic pain, COPD (chronic obstructive pulmonary disease) ([place]), DDD (degenerative disc disease), cervical, Degenerative disk disease, Depression, Dysrhythmia, Endometriosis, Family history of chronic pain (06/29/2015), Fibromyalgia, Fibromyalgia, GERD (gastroesophageal reflux disease), Heart murmur, Hiatal hernia, History of attempted suicide (06/29/2015), History of hiatal hernia (06/29/2015), History of psychiatric disorder (06/29/2015), History of suicidal ideation (06/29/2015), Hyperlipidemia, Insomnia, Insomnia, Irritable bowel syndrome, Squamous cell carcinoma of skin (03/08/2007), 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 Surgical: Ms. Minor  has a past surgical history that includes Abdominal hysterectomy (1986); Tonsillectomy (1957); Skin lesion excision; Oophorectomy (1986); Breast surgery; ORIF ankle fracture (Left, 03/14/2018); ORIF ankle fracture (Left, 07/26/2018); Ankle surgery; Colonoscopy with propofol (N/A, 06/05/2019); Breast biopsy (Right, 2016); Fracture surgery; and Colonoscopy with propofol (N/A, 03/05/2020). Family: family history includes Breast cancer in her maternal aunt; Heart disease in her father; Hypertension in her father and mother.  Laboratory Chemistry Profile   Renal Lab Results  Component Value Date   BUN 21 04/26/2019   CREATININE 0.84 04/26/2019   GFRAA >60 04/26/2019   GFRNONAA >60 04/26/2019    Hepatic Lab Results  Component Value Date   AST 35 04/26/2019   ALT 22 04/26/2019   ALBUMIN 4.1 04/26/2019   ALKPHOS 71 04/26/2019    Electrolytes Lab Results  Component Value Date   NA 135 04/26/2019   K 4.5 04/26/2019   CL 103 04/26/2019   CALCIUM 9.4 04/26/2019   MG 1.8 04/26/2019    Bone Lab Results  Component Value Date   VD25OH 40.8 04/26/2019   25OHVITD1 49 03/09/2016   25OHVITD2 <1.0 03/09/2016   25OHVITD3 48 03/09/2016    Inflammation (CRP: Acute Phase) (ESR: Chronic Phase) Lab Results  Component Value Date   CRP 2.2 (H) 04/26/2019   ESRSEDRATE 39 (H) 04/26/2019         Note: Above Lab results reviewed.  Recent Imaging Review  MM 3D SCREEN BREAST BILATERAL CLINICAL DATA:  Screening.  EXAM: DIGITAL SCREENING BILATERAL MAMMOGRAM WITH TOMO AND CAD  COMPARISON:  Previous exam(s).  ACR Breast Density Category c: The breast tissue is heterogeneously dense, which may obscure small masses.  FINDINGS: There are no findings suspicious for malignancy. Images were processed with CAD.  IMPRESSION: No mammographic evidence of malignancy. A result letter of this screening mammogram will be mailed directly to the  patient.  RECOMMENDATION: Screening mammogram in one year. (Code:[id number]-B-01Y)  BI-RADS CATEGORY  1: Negative.  Electronically Signed   By: Margarette Canada M.D.   On: 06/25/2019 11:04 Note: Reviewed        Physical Exam  General appearance: Well nourished, well developed, and well hydrated. In no apparent acute distress Mental status: Alert, oriented x 3 (person, place, & time)       Respiratory: No evidence of acute respiratory distress Eyes: PERLA Vitals: BP (!) 105/53 (BP Location: Left Arm, Patient Position: Sitting, Cuff Size: Normal)   Pulse 70   Temp (!) 97.2 F (36.2 C) (Temporal)   Resp 16   Ht '5\' 3"'  (1.6 m)  Wt 155 lb (70.3 kg)   SpO2 98%   BMI 27.46 kg/m  BMI: Estimated body mass index is 27.46 kg/m as calculated from the following:   Height as of this encounter: '5\' 3"'  (1.6 m).   Weight as of this encounter: 155 lb (70.3 kg). Ideal: Ideal body weight: 52.4 kg (115 lb 8.3 oz) Adjusted ideal body weight: 59.6 kg (131 lb 5 oz)  Assessment   Status Diagnosis  Controlled Controlled Controlled 1. Chronic low back pain (1ry area of Pain) (Bilateral) (L>R) w/ sciatica (Bilateral) (L>R)   2. Spondylosis of lumbar spine   3. L4-5 Lumbar Facet joint Synovial cyst   4. Decreased range of motion of lumbar spine   5. Other intervertebral disc degeneration, lumbar region   6. Chronic lower extremity pain (2ry area of Pain) (Bilateral) (L>R)   7. Lumbosacral sensory radiculopathy at S1 (Bilateral)   8. Numbness and tingling of lower extremity (Bilateral)   9. Chronic hip pain (3ry area of Pain) (Bilateral)   10. Chronic neck pain (4th area of Pain) (Bilateral) (R>L)   11. Chronic upper extremity pain (5th area of Pain) (Bilateral) (R>L)   12. Chronic pain syndrome   13. Pharmacologic therapy   14. Chronic use of opiate for therapeutic purpose   15. Encounter for medication management   16. Encounter for chronic pain management      Updated Problems: Problem  Lumbosacral  sensory radiculopathy at S1 (Bilateral)  Decreased Range of Motion of Lumbar Spine  Numbness and tingling of lower extremity (Bilateral)  Chronic lower extremity pain (2ry area of Pain) (Bilateral) (L>R)   RLE: S1 Dermatome. LLE: S1 Dermatiome.   Synovial Cyst of Lumbar Spine  Chronic hip pain (3ry area of Pain) (Bilateral)  Chronic upper extremity pain (5th area of Pain) (Bilateral) (R>L)  Chronic neck pain (4th area of Pain) (Bilateral) (R>L)  Cervical herniated disc (C5-6 and C6-7)  Cervical foraminal stenosis (Bilateral) (C5-6)  Chronic low back pain (1ry area of Pain) (Bilateral) (L>R) w/ sciatica (Bilateral) (L>R)  Spondylosis of Lumbar Spine  Cervical Syndrome  Lumbar Spondylosis  Cervical Radiculopathy  Stenosis of Intervertebral Foramina    Plan of Care  Problem-specific:  No problem-specific Assessment & Plan notes found for this encounter.  Ms. EMONNI DEPASQUALE has a current medication list which includes the following long-term medication(s): albuterol, amitriptyline, vitamin d3, cyanocobalamin, cyclobenzaprine, dicyclomine, estradiol, fluticasone, hydrocodone-acetaminophen, [START ON 06/24/2021] hydrocodone-acetaminophen, [START ON 07/24/2021] hydrocodone-acetaminophen, magnesium gluconate, melatonin, montelukast, omeprazole, pregabalin, propranolol, rosuvastatin, and salmeterol.  Pharmacotherapy (Medications Ordered): Meds ordered this encounter  Medications   HYDROcodone-acetaminophen (NORCO/VICODIN) 5-325 MG tablet    Sig: Take 1 tablet by mouth every 4 (four) hours as needed for severe pain. Must last 30 days    Dispense:  180 tablet    Refill:  0    Not a duplicate. Do NOT delete! Dispense 1 day early if closed on refill date. Avoid benzodiazepines within 8 hours of opioids. Do not send refill requests.   HYDROcodone-acetaminophen (NORCO/VICODIN) 5-325 MG tablet    Sig: Take 1 tablet by mouth every 4 (four) hours as needed for severe pain. Must last 30 days     Dispense:  180 tablet    Refill:  0    Not a duplicate. Do NOT delete! Dispense 1 day early if closed on refill date. Avoid benzodiazepines within 8 hours of opioids. Do not send refill requests.   HYDROcodone-acetaminophen (NORCO/VICODIN) 5-325 MG tablet    Sig: Take 1  tablet by mouth every 4 (four) hours as needed for severe pain. Must last 30 days    Dispense:  180 tablet    Refill:  0    Not a duplicate. Do NOT delete! Dispense 1 day early if closed on refill date. Avoid benzodiazepines within 8 hours of opioids. Do not send refill requests.    Orders:  Orders Placed This Encounter  Procedures   DG Cervical Spine With Flex & Extend    Patient presents with axial pain with possible radicular component.  Please evaluate for any evidence of cervical spine instability. Describe the presence of any spondylolisthesis (Antero- or retrolisthesis). If present, provide displacement "Grade" and measurement in cm. Please describe presence and specific location (Level & Laterality) of any signs of  osteoarthritis, zygapophyseal (Facet) joints DJD (including decreased joint space and/or osteophytosis), DDD, Foraminal narrowing, as well as any sclerosis and/or cyst formation. Please comment on ROM. Patient presents with axial pain with possible radicular component. Please assist [place] in identifying specific level(s) and laterality of any additional findings such as: 1. Facet (Zygapophyseal) joint DJD (Hypertrophy, space narrowing, subchondral sclerosis, and/or osteophyte formation) 2. DDD and/or IVDD (Loss of disc height, desiccation, gas patterns, osteophytes, endplate sclerosis, or "Black disc disease") 3. Pars defects 4. Spondylolisthesis, spondylosis, and/or spondyloarthropathies (include Degree/Grade of displacement in mm) (stability) 5. Vertebral body Fractures (acute/chronic) (state percentage of collapse) 6. Demineralization (osteopenia/osteoporotic) 7. Bone pathology 8. Foraminal narrowing  9.  Surgical changes    Standing Status:   Future    Standing Expiration Date:   06/24/2021    Scheduling Instructions:     Imaging must be done as soon as possible. Inform patient that order will expire within 30 days and I will not renew it.    Order Specific Question:   Reason for Exam (SYMPTOM  OR DIAGNOSIS REQUIRED)    Answer:   Cervicalgia    Order Specific Question:   Preferred imaging location?    Answer:   [facility]    Order Specific Question:   Call Results- Best Contact Number?    Answer:   [phone] ([facility])    Order Specific Question:   Radiology Contrast Protocol - do NOT remove file path    Answer:   [email or web address]    Order Specific Question:   Release to patient    Answer:   Immediate   MR LUMBAR SPINE WO CONTRAST    Patient presents with axial pain with possible radicular component. Please assist [place] in identifying specific level(s) and laterality of any additional findings such as: 1. Facet (Zygapophyseal) joint DJD (Hypertrophy, space narrowing, subchondral sclerosis, and/or osteophyte formation) 2. DDD and/or IVDD (Loss of disc height, desiccation, gas patterns, osteophytes, endplate sclerosis, or "Black disc disease") 3. Pars defects 4. Spondylolisthesis, spondylosis, and/or spondyloarthropathies (include Degree/Grade of displacement in mm) (stability) 5. Vertebral body Fractures (acute/chronic) (state percentage of collapse) 6. Demineralization (osteopenia/osteoporotic) 7. Bone pathology 8. Foraminal narrowing  9. Surgical changes 10. Central, Lateral Recess, and/or Foraminal Stenosis (include AP diameter of stenosis in mm) 11. Surgical changes (hardware type, status, and presence of fibrosis) 12. Modic Type Changes (MRI only) 13. IVDD (Disc bulge, protrusion, herniation, extrusion) (Level, laterality, extent)    Standing Status:   Future    Standing Expiration Date:   06/24/2021    Scheduling  Instructions:     Imaging must be done as soon as possible. Inform patient that order will expire within 30 days and I will not renew it.  Order Specific Question:   What is the patient's sedation requirement?    Answer:   No Sedation    Order Specific Question:   Does the patient have a pacemaker or implanted devices?    Answer:   No    Order Specific Question:   Preferred imaging location?    Answer:   [facility] (table limit-350lbs)    Order Specific Question:   Call Results- Best Contact Number?    Answer:   [phone] ([facility])    Order Specific Question:   Radiology Contrast Protocol - do NOT remove file path    Answer:   \\[email or web address]   Ambulatory referral to Physical Therapy    Referral Priority:   Routine    Referral Type:   Physical Medicine    Referral Reason:   Specialty Services Required    Requested Specialty:   Physical Therapy    Number of Visits Requested:   1    Follow-up plan:   Return in about 3 months (around 08/23/2021) for Eval-day(M,W), (F2F), (MM), for review of ordered tests.     Interventional management options:  Considering:   Diagnostic bilateral cervical facet block  Possible bilateral cervical facet RFA  Diagnostic right CESI  Diagnostic bilateral IA shoulder joint injection  Diagnostic bilateral suprascapular NB  Possible bilateral suprascapular nerve RFA  Possible bilateral lumbar facet RFA  Diagnostic left sided carpal tunnel injection    Palliative PRN treatment(s):   Diagnostic bilateral lumbar facet block #3 (w/o Steroids)       Recent Visits No visits were found meeting these conditions. Showing recent visits within past 90 days and meeting all other requirements Today's Visits Date Type Provider Dept  05/25/21 Office Visit Milinda Pointer, MD [facility]  Showing today's visits and meeting all other requirements Future Appointments No visits were found meeting  these conditions. Showing future appointments within next 90 days and meeting all other requirements I discussed the assessment and treatment plan with the patient. The patient was provided an opportunity to ask questions and all were answered. The patient agreed with the plan and demonstrated an understanding of the instructions.  Patient advised to call back or seek an in-person evaluation if the symptoms or condition worsens.  Duration of encounter: 30 minutes.  Note by: Zoe Cola, MD Date: 05/25/2021; Time: 1:52 PM

## 2021-05-25 ENCOUNTER — Ambulatory Visit: Payer: Medicare Other | Attending: Pain Medicine | Admitting: Pain Medicine

## 2021-05-25 ENCOUNTER — Other Ambulatory Visit: Payer: Self-pay

## 2021-05-25 ENCOUNTER — Encounter: Payer: Self-pay | Admitting: Pain Medicine

## 2021-05-25 VITALS — BP 105/53 | HR 70 | Temp 97.2°F | Resp 16 | Ht 63.0 in | Wt 155.0 lb

## 2021-05-25 DIAGNOSIS — M79605 Pain in left leg: Secondary | ICD-10-CM

## 2021-05-25 DIAGNOSIS — M5442 Lumbago with sciatica, left side: Secondary | ICD-10-CM | POA: Diagnosis not present

## 2021-05-25 DIAGNOSIS — M5386 Other specified dorsopathies, lumbar region: Secondary | ICD-10-CM | POA: Insufficient documentation

## 2021-05-25 DIAGNOSIS — M542 Cervicalgia: Secondary | ICD-10-CM

## 2021-05-25 DIAGNOSIS — Z79899 Other long term (current) drug therapy: Secondary | ICD-10-CM

## 2021-05-25 DIAGNOSIS — M5441 Lumbago with sciatica, right side: Secondary | ICD-10-CM

## 2021-05-25 DIAGNOSIS — M5136 Other intervertebral disc degeneration, lumbar region: Secondary | ICD-10-CM | POA: Diagnosis present

## 2021-05-25 DIAGNOSIS — M79602 Pain in left arm: Secondary | ICD-10-CM

## 2021-05-25 DIAGNOSIS — G8929 Other chronic pain: Secondary | ICD-10-CM | POA: Diagnosis present

## 2021-05-25 DIAGNOSIS — Z79891 Long term (current) use of opiate analgesic: Secondary | ICD-10-CM | POA: Diagnosis present

## 2021-05-25 DIAGNOSIS — M25551 Pain in right hip: Secondary | ICD-10-CM | POA: Diagnosis present

## 2021-05-25 DIAGNOSIS — M7138 Other bursal cyst, other site: Secondary | ICD-10-CM | POA: Diagnosis not present

## 2021-05-25 DIAGNOSIS — R200 Anesthesia of skin: Secondary | ICD-10-CM | POA: Diagnosis present

## 2021-05-25 DIAGNOSIS — M47816 Spondylosis without myelopathy or radiculopathy, lumbar region: Secondary | ICD-10-CM | POA: Diagnosis present

## 2021-05-25 DIAGNOSIS — M79601 Pain in right arm: Secondary | ICD-10-CM

## 2021-05-25 DIAGNOSIS — R202 Paresthesia of skin: Secondary | ICD-10-CM

## 2021-05-25 DIAGNOSIS — M25552 Pain in left hip: Secondary | ICD-10-CM | POA: Diagnosis present

## 2021-05-25 DIAGNOSIS — M79604 Pain in right leg: Secondary | ICD-10-CM

## 2021-05-25 DIAGNOSIS — G894 Chronic pain syndrome: Secondary | ICD-10-CM | POA: Diagnosis present

## 2021-05-25 DIAGNOSIS — M5417 Radiculopathy, lumbosacral region: Secondary | ICD-10-CM | POA: Insufficient documentation

## 2021-05-25 MED ORDER — HYDROCODONE-ACETAMINOPHEN 5-325 MG PO TABS: 1.0000 | ORAL_TABLET | ORAL | 0 refills | Status: DC | PRN

## 2021-05-25 NOTE — Progress Notes (Signed)
Nursing Pain Medication Assessment:  Safety precautions to be maintained throughout the outpatient stay will include: orient to surroundings, keep bed in low position, maintain call bell within reach at all times, provide assistance with transfer out of bed and ambulation.  Medication Inspection Compliance: Pill count conducted under aseptic conditions, in front of the patient. Neither the pills nor the bottle was removed from the patient's sight at any time. Once count was completed pills were immediately returned to the patient in their original bottle.  Medication: Hydrocodone/APAP Pill/Patch Count:  29 of 180 pills remain Pill/Patch Appearance: Markings consistent with prescribed medication Bottle Appearance: Standard pharmacy container. Clearly labeled. Filled Date: 08 / 13 / 2022 Last Medication intake:  Today

## 2021-06-04 ENCOUNTER — Ambulatory Visit
Admission: RE | Admit: 2021-06-04 | Discharge: 2021-06-04 | Disposition: A | Discharge status: Home / Self Care | Payer: Medicare Other | Source: Ambulatory Visit | Attending: Pain Medicine | Admitting: Pain Medicine

## 2021-06-04 ENCOUNTER — Other Ambulatory Visit: Payer: Self-pay

## 2021-06-04 DIAGNOSIS — R202 Paresthesia of skin: Secondary | ICD-10-CM | POA: Diagnosis present

## 2021-06-04 DIAGNOSIS — M25551 Pain in right hip: Secondary | ICD-10-CM | POA: Insufficient documentation

## 2021-06-04 DIAGNOSIS — M25552 Pain in left hip: Secondary | ICD-10-CM | POA: Diagnosis present

## 2021-06-04 DIAGNOSIS — M47816 Spondylosis without myelopathy or radiculopathy, lumbar region: Secondary | ICD-10-CM | POA: Diagnosis present

## 2021-06-04 DIAGNOSIS — M5442 Lumbago with sciatica, left side: Secondary | ICD-10-CM | POA: Insufficient documentation

## 2021-06-04 DIAGNOSIS — M7138 Other bursal cyst, other site: Secondary | ICD-10-CM | POA: Diagnosis not present

## 2021-06-04 DIAGNOSIS — M5441 Lumbago with sciatica, right side: Secondary | ICD-10-CM | POA: Diagnosis present

## 2021-06-04 DIAGNOSIS — G8929 Other chronic pain: Secondary | ICD-10-CM | POA: Insufficient documentation

## 2021-06-04 DIAGNOSIS — M5417 Radiculopathy, lumbosacral region: Secondary | ICD-10-CM | POA: Diagnosis present

## 2021-06-04 DIAGNOSIS — R200 Anesthesia of skin: Secondary | ICD-10-CM | POA: Insufficient documentation

## 2021-06-04 DIAGNOSIS — M79604 Pain in right leg: Secondary | ICD-10-CM | POA: Insufficient documentation

## 2021-06-04 DIAGNOSIS — M79605 Pain in left leg: Secondary | ICD-10-CM | POA: Diagnosis present

## 2021-06-04 DIAGNOSIS — M5136 Other intervertebral disc degeneration, lumbar region: Secondary | ICD-10-CM | POA: Diagnosis present

## 2021-06-04 DIAGNOSIS — M5386 Other specified dorsopathies, lumbar region: Secondary | ICD-10-CM | POA: Diagnosis present

## 2021-06-24 ENCOUNTER — Other Ambulatory Visit: Payer: Self-pay | Admitting: Pain Medicine

## 2021-06-24 DIAGNOSIS — G894 Chronic pain syndrome: Secondary | ICD-10-CM

## 2021-06-24 DIAGNOSIS — Z79891 Long term (current) use of opiate analgesic: Secondary | ICD-10-CM

## 2021-06-24 DIAGNOSIS — Z79899 Other long term (current) drug therapy: Secondary | ICD-10-CM

## 2021-07-07 ENCOUNTER — Ambulatory Visit: Payer: Medicare Other | Admitting: Dermatology

## 2021-07-26 ENCOUNTER — Other Ambulatory Visit: Payer: Self-pay | Admitting: Pain Medicine

## 2021-07-26 DIAGNOSIS — G894 Chronic pain syndrome: Secondary | ICD-10-CM

## 2021-07-26 DIAGNOSIS — Z79899 Other long term (current) drug therapy: Secondary | ICD-10-CM

## 2021-07-26 DIAGNOSIS — Z79891 Long term (current) use of opiate analgesic: Secondary | ICD-10-CM

## 2021-08-15 NOTE — Progress Notes (Signed)
PROVIDER NOTE: Information contained herein reflects review and annotations entered in association with encounter. Interpretation of such information and data should be left to medically-trained personnel. Information provided to patient can be located elsewhere in the medical record under "Patient Instructions". Document created using STT-dictation technology, any transcriptional errors that may result from process are unintentional.    Patient: Zoe Sanchez  Service Category: E/M  Provider: Gaspar Cola, MD  DOB: 09-08-1956  DOS: 08/16/2021  Specialty: Interventional Pain Management  MRN: [id number]  Setting: Ambulatory outpatient  PCP: Denton Lank, MD  Type: Established Patient    Referring Provider: Denton Lank, MD  Location: Office  Delivery: Face-to-face     HPI  Zoe Sanchez, a 65 y.o. year old female, is here today because of her Chronic bilateral low back pain with bilateral sciatica [M54.42, M54.41, G89.29]. Zoe Sanchez primary complain today is Back Pain Last encounter: My last encounter with her was on 07/26/2021. Pertinent problems: Zoe Sanchez Sanchez Myofascial pain syndrome; Cervical facet syndrome (Bilateral) (R>L); Chronic neck pain (4th area of Pain) (Bilateral) (R>L); Chronic pain syndrome; Cervical spondylosis; Fibromyalgia; Cervical herniated disc (C5-6 and C6-7); Cervical foraminal stenosis (Bilateral) (C5-6); Chronic cervical radicular pain (Bilateral) (R>L) (C8 Dermatome); Chronic low back pain (1ry area of Pain) (Bilateral) (L>R) w/ sciatica (Bilateral) (L>R); Spondylosis of lumbar spine; Family history of chronic pain; Carpal tunnel syndrome (Left); Chronic shoulder pain (Bilateral); Cervical facet arthropathy; Lumbar facet arthropathy; Bimalleolar fracture of left ankle; Lumbar facet syndrome (Bilateral) (R>L); Chronic upper extremity pain (5th area of Pain) (Bilateral) (R>L); L4-5 Lumbar Facet joint Synovial cyst; Chronic lower extremity pain (2ry area of Pain)  (Bilateral) (L>R); Chronic lumbar radicular pain (Bilateral) (S1); Arthropathy of lumbar facet joint; Cervical syndrome; Lumbar spondylosis; Synovial cyst of lumbar spine; Prolapsed cervical intervertebral disc; Lumbar facet joint pain; Cervical radiculopathy; Stenosis of intervertebral foramina; Chronic hip pain (3ry area of Pain) (Bilateral); Shoulder pain; Chronic musculoskeletal pain; Neurogenic pain; Closed trimalleolar fracture of left ankle; Bilateral lower extremity edema; Chronic pain of multiple sites; Closed fracture of fifth metatarsal bone; Lumbosacral sensory radiculopathy at S1 (Bilateral); Decreased range of motion of lumbar spine; and Numbness and tingling of lower extremity (Bilateral) on their pertinent problem list. Pain Assessment: Severity of Chronic pain is reported as a 5 /10. Location: Back Lower, Right, Left/Radiates from lower back into hips bilateral. Onset: More than a month ago. Quality: Aching, Discomfort. Timing: Intermittent. Modifying factor(s): Hydocodone, hot shower, and wlaking while leaning foward. Vitals:  height is '5\' 3"'  (1.6 m) and weight is 165 lb (74.8 kg). Her oral temperature is 97.1 F (36.2 C) (abnormal). Her blood pressure is 117/59 (abnormal) and her pulse is 61. Her respiration is 18 and oxygen saturation is 98%.   Reason for encounter: medication management.   The patient indicates doing well with the current medication regimen. No adverse reactions or side effects reported to the medications.  Patient indicates increasing her pain.  Last lumbar MRI done 06/04/2021.  Last cervical MRI done on 01/17/2017.  Again, like many other times, I explained to the patient the issue of tolerance and how to address using the "drug holidays".  She indicates that she does not think that she can do that.  She also indicated that she does not want any surgery.  She does not want any interventional therapies, and despite the fact that her pain occasionally increases, her last  Lumbar MRI when compared to the 2018 shows that there was no change.  This  would confirm that the issue is likely to be secondary to opioid tolerance, but she does not want to do anything about it.  I also reminded her that I will not be increasing doses.  Today I went over the results of her lumbar MRI done on 06/04/2021 and I provided her with a copy of that MRI, as well as a copy of her 2018 cervical and lumbar MRIs.  RTCB: 11/21/2021 Nonopioids transferred 08/24/2020: Magnesium, Flexeril, Lyrica, melatonin, vitamin B12, and vitamin D3.  Pharmacotherapy Assessment  Analgesic: Hydrocodone/APAP 5/325, 1 tab PO q 4 hrs (30 mg/day of hydrocodone)(1,950 mg/day of acetaminophen) MME/day: 30 mg/day.   Monitoring: [place] PMP: PDMP reviewed during this encounter.       Pharmacotherapy: No side-effects or adverse reactions reported. Compliance: No problems identified. Effectiveness: Clinically acceptable.  Al Decant, RN  08/16/2021  1:35 PM  Sign when Signing Visit Safety precautions to be maintained throughout the outpatient stay will include: orient to surroundings, keep bed in low position, maintain call bell within reach at all times, provide assistance with transfer out of bed and ambulation.   Nursing Pain Medication Assessment:  Safety precautions to be maintained throughout the outpatient stay will include: orient to surroundings, keep bed in low position, maintain call bell within reach at all times, provide assistance with transfer out of bed and ambulation.  Medication Inspection Compliance: Pill count conducted under aseptic conditions, in front of the patient. Neither the pills nor the bottle was removed from the patient's sight at any time. Once count was completed pills were immediately returned to the patient in their original bottle.  Medication: Hydrocodone/APAP Pill/Patch Count:  74 of 180 pills remain Pill/Patch Appearance: Markings consistent with prescribed medication Bottle  Appearance: Standard pharmacy container. Clearly labeled. Filled Date: 72 / 12 / 2022 Last Medication intake:  Today     UDS:  Summary  Date Value Ref Range Status  02/17/2021 Note  Final    Comment:    ==================================================================== [employer] (MW) ==================================================================== Test                             Result       Flag       Units  Drug Present and Declared for Prescription Verification   Hydrocodone                    >6803        EXPECTED   ng/mg creat   Hydromorphone                  477          EXPECTED   ng/mg creat   Dihydrocodeine                 1059         EXPECTED   ng/mg creat   Norhydrocodone                 2802         EXPECTED   ng/mg creat    Sources of hydrocodone include scheduled prescription medications.    Hydromorphone, dihydrocodeine and norhydrocodone are expected    metabolites of hydrocodone. Hydromorphone and dihydrocodeine are    also available as scheduled prescription medications.  Drug Present not Declared for Prescription Verification   Oxazepam  18           UNEXPECTED ng/mg creat    Oxazepam may be administered as a scheduled prescription medication;    it is also an expected metabolite of other benzodiazepine drugs,    including diazepam, chlordiazepoxide, prazepam, clorazepate,    halazepam, and temazepam.  ==================================================================== Test                      Result    Flag   Units      Ref Range   Creatinine              147              mg/dL      >=20 ==================================================================== Declared Medications:  The flagging and interpretation on this report are based on the  following declared medications.  Unexpected results may arise from  inaccuracies in the declared medications.   **Note: The testing scope of this panel includes these  medications:   Hydrocodone (Norco)   **Note: The testing scope of this panel does not include the  following reported medications:   Acetaminophen (Norco)  Albuterol  Amitriptyline (Elavil)  Amoxicillin  Cholecalciferol  Cyanocobalamin  Cyclobenzaprine (Flexeril)  Dicyclomine (Bentyl)  Estradiol  Fluticasone (Flonase)  Magnesium  Melatonin  Montelukast (Singulair)  Omeprazole  Pregabalin (Lyrica)  Propranolol  Rosuvastatin  Umeclidinium (Incruse Ellipta)  Zolpidem (Ambien) ==================================================================== For clinical consultation, please call [phone]. ====================================================================      ROS  Constitutional: Denies any fever or chills Gastrointestinal: No reported hemesis, hematochezia, vomiting, or acute GI distress Musculoskeletal: Denies any acute onset joint swelling, redness, loss of ROM, or weakness Neurological: No reported episodes of acute onset apraxia, aphasia, dysarthria, agnosia, amnesia, paralysis, loss of coordination, or loss of consciousness  Medication Review  HYDROcodone-acetaminophen, Melatonin, Vitamin D3, albuterol, amitriptyline, amoxicillin, cyanocobalamin, cyclobenzaprine, dicyclomine, estradiol, fluticasone, magnesium gluconate, montelukast, omeprazole, pregabalin, propranolol, rosuvastatin, salmeterol, umeclidinium bromide, and zolpidem  History Review  Allergy: Zoe Sanchez is allergic to prednisone. Drug: Zoe Sanchez  reports no history of drug use. Alcohol:  reports no history of alcohol use. Tobacco:  reports that she Sanchez never smoked. She Sanchez never used smokeless tobacco. Social: Zoe Sanchez  reports that she Sanchez never smoked. She Sanchez never used smokeless tobacco. She reports that she does not drink alcohol and does not use drugs. Medical:  Sanchez a past medical history of Abnormal heart rhythm, Abnormal mammogram (02/25/2014), Actinic keratosis, Anemia, Anxiety,  Asthma, Awareness of heartbeats (06/29/2015), Basal cell carcinoma (04/24/2008), Basal cell carcinoma (04/09/2009), Basal cell carcinoma (BCC) (12/23/2014), Breath shortness (06/29/2015), CAD (coronary artery disease), Cancer (HCC), Cervical radicular pain (Location of Secondary source of pain) (Bilateral) (R>L) (C8 Dermatome) (06/29/2015), Chronic pain, COPD (chronic obstructive pulmonary disease) ([place]), DDD (degenerative disc disease), cervical, Degenerative disk disease, Depression, Dysrhythmia, Endometriosis, Family history of chronic pain (06/29/2015), Fibromyalgia, Fibromyalgia, GERD (gastroesophageal reflux disease), Heart murmur, Hiatal hernia, History of attempted suicide (06/29/2015), History of hiatal hernia (06/29/2015), History of psychiatric disorder (06/29/2015), History of suicidal ideation (06/29/2015), Hyperlipidemia, Insomnia, Insomnia, Irritable bowel syndrome, Squamous cell carcinoma of skin (03/08/2007), 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 Surgical: Zoe Sanchez  Sanchez a past surgical history that includes Abdominal hysterectomy (1986); Tonsillectomy (1957); Skin lesion excision; Oophorectomy (1986); Breast surgery; ORIF ankle fracture (Left, 03/14/2018); ORIF ankle fracture (Left, 07/26/2018); Ankle surgery; Colonoscopy with propofol (N/A, 06/05/2019); Breast biopsy (Right, 2016); Fracture surgery; and Colonoscopy with propofol (N/A, 03/05/2020).  Family: family history includes Breast cancer in her maternal aunt; Heart disease in her father; Hypertension in her father and mother.  Laboratory Chemistry Profile   Renal Lab Results  Component Value Date   BUN 21 04/26/2019   CREATININE 0.84 04/26/2019   GFRAA >60 04/26/2019   GFRNONAA >60 04/26/2019    Hepatic Lab Results  Component Value Date   AST 35 04/26/2019   ALT 22 04/26/2019   ALBUMIN 4.1 04/26/2019   ALKPHOS 71 04/26/2019    Electrolytes Lab Results  Component  Value Date   NA 135 04/26/2019   K 4.5 04/26/2019   CL 103 04/26/2019   CALCIUM 9.4 04/26/2019   MG 1.8 04/26/2019    Bone Lab Results  Component Value Date   VD25OH 40.8 04/26/2019   25OHVITD1 49 03/09/2016   25OHVITD2 <1.0 03/09/2016   25OHVITD3 48 03/09/2016    Inflammation (CRP: Acute Phase) (ESR: Chronic Phase) Lab Results  Component Value Date   CRP 2.2 (H) 04/26/2019   ESRSEDRATE 39 (H) 04/26/2019         Note: Above Lab results reviewed.  Recent Imaging Review  MR LUMBAR SPINE WO CONTRAST CLINICAL DATA:  Osteoarthritis, lumbosacral Low back pain, > 6 wks Lumbar radiculopathy, > 6 wks  EXAM: MRI LUMBAR SPINE WITHOUT CONTRAST  TECHNIQUE: Multiplanar, multisequence MR imaging of the lumbar spine was performed. No intravenous contrast was administered.  COMPARISON:  May 2018  FINDINGS: Segmentation:  Standard.  Alignment:  Levocurvature.  Anteroposterior alignment preserved.  Vertebrae: Stable vertebral body heights. No marrow edema. No suspicious osseous lesion.  Conus medullaris and cauda equina: Conus extends to the L1 level. Conus and cauda equina appear normal.  Paraspinal and other soft tissues: Unremarkable.  Disc levels:  L1-L2:  Trace disc bulge.  No canal or foraminal stenosis.  L2-L3: Trace disc bulge with superimposed small left foraminal protrusion. No canal or foraminal stenosis.  L3-L4: Trace disc bulge with superimposed small left foraminal protrusion. Mild facet arthropathy with ligamentum flavum infolding. No canal stenosis. Minimal left subarticular recess narrowing. No right foraminal stenosis. Minimal left foraminal stenosis.  L4-L5: Moderate facet arthropathy with ligamentum flavum infolding. Facet synovial cyst is no longer present. No canal or foraminal stenosis.  L5-S1:  Moderate facet arthropathy.  No canal or foraminal stenosis.  IMPRESSION: Overall mild degenerative changes as detailed above without progression  since 2018 examination. Lower lumbar facet arthropathy.  Electronically Signed   By: Macy Mis M.D.   On: 06/05/2021 15:05 Note: Reviewed        Physical Exam  General appearance: Well nourished, well developed, and well hydrated. In no apparent acute distress Mental status: Alert, oriented x 3 (person, place, & time)       Respiratory: No evidence of acute respiratory distress Eyes: PERLA Vitals: BP (!) 117/59   Pulse 61   Temp (!) 97.1 F (36.2 C) (Oral)   Resp 18   Ht '5\' 3"'  (1.6 m)   Wt 165 lb (74.8 kg)   SpO2 98%   BMI 29.23 kg/m  BMI: Estimated body mass index is 29.23 kg/m as calculated from the following:   Height as of this encounter: '5\' 3"'  (1.6 m).   Weight as of this encounter: 165 lb (74.8 kg). Ideal: Ideal body weight: 52.4 kg (115 lb 8.3 oz) Adjusted ideal body weight: 61.4 kg (135 lb 5 oz)  Assessment   Status Diagnosis  Controlled Controlled Controlled 1. Chronic low back pain (1ry area of Pain) (Bilateral) (  L>R) w/ sciatica (Bilateral) (L>R)   2. Lumbar facet joint pain   3. Chronic lower extremity pain (2ry area of Pain) (Bilateral) (L>R)   4. Chronic hip pain (3ry area of Pain) (Bilateral)   5. Chronic neck pain (4th area of Pain) (Bilateral) (R>L)   6. Cervical facet syndrome (Bilateral) (R>L)   7. Chronic upper extremity pain (5th area of Pain) (Bilateral) (R>L)   8. Fibromyalgia   9. Chronic pain syndrome   10. Pharmacologic therapy   11. Chronic use of opiate for therapeutic purpose   12. Encounter for medication management   13. Encounter for chronic pain management      Updated Problems: No problems updated.  Plan of Care  Problem-specific:  No problem-specific Assessment & Plan notes found for this encounter.  Zoe Sanchez a current medication list which includes the following long-term medication(s): albuterol, amitriptyline, dicyclomine, estradiol, fluticasone, [START ON 08/23/2021] hydrocodone-acetaminophen, [START ON  09/22/2021] hydrocodone-acetaminophen, [START ON 10/22/2021] hydrocodone-acetaminophen, montelukast, omeprazole, propranolol, rosuvastatin, salmeterol, vitamin d3, cyanocobalamin, cyclobenzaprine, magnesium gluconate, melatonin, and pregabalin.  Pharmacotherapy (Medications Ordered): Meds ordered this encounter  Medications   HYDROcodone-acetaminophen (NORCO/VICODIN) 5-325 MG tablet    Sig: Take 1 tablet by mouth every 4 (four) hours as needed for severe pain. Must last 30 days    Dispense:  180 tablet    Refill:  0    DO NOT: delete (not duplicate); no partial-fill (will deny script to complete), no refill request (F/U required). DISPENSE: 1 day early if closed on fill date. WARN: No CNS-depressants within 8 hrs of med.   HYDROcodone-acetaminophen (NORCO/VICODIN) 5-325 MG tablet    Sig: Take 1 tablet by mouth every 4 (four) hours as needed for severe pain. Must last 30 days    Dispense:  180 tablet    Refill:  0    DO NOT: delete (not duplicate); no partial-fill (will deny script to complete), no refill request (F/U required). DISPENSE: 1 day early if closed on fill date. WARN: No CNS-depressants within 8 hrs of med.   HYDROcodone-acetaminophen (NORCO/VICODIN) 5-325 MG tablet    Sig: Take 1 tablet by mouth every 4 (four) hours as needed for severe pain. Must last 30 days    Dispense:  180 tablet    Refill:  0    DO NOT: delete (not duplicate); no partial-fill (will deny script to complete), no refill request (F/U required). DISPENSE: 1 day early if closed on fill date. WARN: No CNS-depressants within 8 hrs of med.    Orders:  No orders of the defined types were placed in this encounter.  Follow-up plan:   Return in about 3 months (around 11/21/2021) for Eval-day (M,W), (F2F), (MM).     Interventional Therapies  Risk  Complexity Considerations:   Estimated body mass index is 29.23 kg/m as calculated from the following:   Height as of this encounter: '5\' 3"'  (1.6 m).   Weight as of this  encounter: 165 lb (74.8 kg). WNL   Planned  Pending:   none   Under consideration:   Diagnostic bilateral cervical facet MBB  Diagnostic right CESI  Diagnostic bilateral IA shoulder joint injection  Diagnostic bilateral suprascapular NB  Possible bilateral lumbar facet RFA  Diagnostic left carpal tunnel injection    Completed:   Diagnostic bilateral lumbar facet MBB x2 (w/o steroids) (04/20/2017) (100/60/60/<25)    Therapeutic  Palliative (PRN) options:   none    Recent Visits Date Type Provider Dept  05/25/21 Office Visit  Milinda Pointer, MD [facility]  Showing recent visits within past 90 days and meeting all other requirements Today's Visits Date Type Provider Dept  08/16/21 Office Visit Milinda Pointer, MD [facility]  Showing today's visits and meeting all other requirements Future Appointments Date Type Provider Dept  11/10/21 Appointment Milinda Pointer, MD [facility]  Showing future appointments within next 90 days and meeting all other requirements I discussed the assessment and treatment plan with the patient. The patient was provided an opportunity to ask questions and all were answered. The patient agreed with the plan and demonstrated an understanding of the instructions.  Patient advised to call back or seek an in-person evaluation if the symptoms or condition worsens.  Duration of encounter: 30 minutes.  Note by: Gaspar Cola, MD Date: 08/16/2021; Time: 2:05 PM

## 2021-08-16 ENCOUNTER — Encounter: Payer: Self-pay | Admitting: Pain Medicine

## 2021-08-16 ENCOUNTER — Other Ambulatory Visit: Payer: Self-pay

## 2021-08-16 ENCOUNTER — Ambulatory Visit: Payer: Medicare Other | Attending: Pain Medicine | Admitting: Pain Medicine

## 2021-08-16 VITALS — BP 117/59 | HR 61 | Temp 97.1°F | Resp 18 | Ht 63.0 in | Wt 165.0 lb

## 2021-08-16 DIAGNOSIS — M79605 Pain in left leg: Secondary | ICD-10-CM | POA: Insufficient documentation

## 2021-08-16 DIAGNOSIS — M5441 Lumbago with sciatica, right side: Secondary | ICD-10-CM | POA: Diagnosis present

## 2021-08-16 DIAGNOSIS — M5442 Lumbago with sciatica, left side: Secondary | ICD-10-CM | POA: Diagnosis present

## 2021-08-16 DIAGNOSIS — G894 Chronic pain syndrome: Secondary | ICD-10-CM | POA: Diagnosis present

## 2021-08-16 DIAGNOSIS — M25551 Pain in right hip: Secondary | ICD-10-CM | POA: Insufficient documentation

## 2021-08-16 DIAGNOSIS — M79602 Pain in left arm: Secondary | ICD-10-CM | POA: Insufficient documentation

## 2021-08-16 DIAGNOSIS — Z79891 Long term (current) use of opiate analgesic: Secondary | ICD-10-CM | POA: Insufficient documentation

## 2021-08-16 DIAGNOSIS — M5459 Other low back pain: Secondary | ICD-10-CM | POA: Insufficient documentation

## 2021-08-16 DIAGNOSIS — M79601 Pain in right arm: Secondary | ICD-10-CM | POA: Insufficient documentation

## 2021-08-16 DIAGNOSIS — G8929 Other chronic pain: Secondary | ICD-10-CM | POA: Insufficient documentation

## 2021-08-16 DIAGNOSIS — Z79899 Other long term (current) drug therapy: Secondary | ICD-10-CM | POA: Diagnosis present

## 2021-08-16 DIAGNOSIS — M542 Cervicalgia: Secondary | ICD-10-CM | POA: Diagnosis present

## 2021-08-16 DIAGNOSIS — M25552 Pain in left hip: Secondary | ICD-10-CM | POA: Insufficient documentation

## 2021-08-16 DIAGNOSIS — M79604 Pain in right leg: Secondary | ICD-10-CM | POA: Diagnosis present

## 2021-08-16 DIAGNOSIS — M797 Fibromyalgia: Secondary | ICD-10-CM | POA: Insufficient documentation

## 2021-08-16 DIAGNOSIS — M47812 Spondylosis without myelopathy or radiculopathy, cervical region: Secondary | ICD-10-CM | POA: Insufficient documentation

## 2021-08-16 MED ORDER — HYDROCODONE-ACETAMINOPHEN 5-325 MG PO TABS: 1.0000 | ORAL_TABLET | ORAL | 0 refills | Status: DC | PRN

## 2021-08-16 NOTE — Progress Notes (Signed)
Safety precautions to be maintained throughout the outpatient stay will include: orient to surroundings, keep bed in low position, maintain call bell within reach at all times, provide assistance with transfer out of bed and ambulation.   Nursing Pain Medication Assessment:  Safety precautions to be maintained throughout the outpatient stay will include: orient to surroundings, keep bed in low position, maintain call bell within reach at all times, provide assistance with transfer out of bed and ambulation.  Medication Inspection Compliance: Pill count conducted under aseptic conditions, in front of the patient. Neither the pills nor the bottle was removed from the patient's sight at any time. Once count was completed pills were immediately returned to the patient in their original bottle.  Medication: Hydrocodone/APAP Pill/Patch Count:  74 of 180 pills remain Pill/Patch Appearance: Markings consistent with prescribed medication Bottle Appearance: Standard pharmacy container. Clearly labeled. Filled Date: 45 / 12 / 2022 Last Medication intake:  Today

## 2021-08-16 NOTE — Patient Instructions (Signed)
____________________________________________________________________________________________  Drug Holidays (Slow)  What is a "Drug Holiday"? Drug Holiday: is the name given to the period of time during which a patient stops taking a medication(s) for the purpose of eliminating tolerance to the drug.  Benefits . Improved effectiveness of opioids. . Decreased opioid dose needed to achieve benefits. . Improved pain with lesser dose.  What is tolerance? Tolerance: is the progressive decreased in effectiveness of a drug due to its repetitive use. With repetitive use, the body gets use to the medication and as a consequence, it loses its effectiveness. This is a common problem seen with opioid pain medications. As a result, a larger dose of the drug is needed to achieve the same effect that used to be obtained with a smaller dose.  How long should a "Drug Holiday" last? You should stay off of the pain medicine for at least 14 consecutive days. (2 weeks)  Should I stop the medicine "cold turkey"? No. You should always coordinate with your Pain Specialist so that he/she can provide you with the correct medication dose to make the transition as smoothly as possible.  How do I stop the medicine? Slowly. You will be instructed to decrease the daily amount of pills that you take by one (1) pill every seven (7) days. This is called a "slow downward taper" of your dose. For example: if you normally take four (4) pills per day, you will be asked to drop this dose to three (3) pills per day for seven (7) days, then to two (2) pills per day for seven (7) days, then to one (1) per day for seven (7) days, and at the end of those last seven (7) days, this is when the "Drug Holiday" would start.   Will I have withdrawals? By doing a "slow downward taper" like this one, it is unlikely that you will experience any significant withdrawal symptoms. Typically, what triggers withdrawals is the sudden stop of a high  dose opioid therapy. Withdrawals can usually be avoided by slowly decreasing the dose over a prolonged period of time. If you do not follow these instructions and decide to stop your medication abruptly, withdrawals may be possible.  What are withdrawals? Withdrawals: refers to the wide range of symptoms that occur after stopping or dramatically reducing opiate drugs after heavy and prolonged use. Withdrawal symptoms do not occur to patients that use low dose opioids, or those who take the medication sporadically. Contrary to benzodiazepine (example: Valium, Xanax, etc.) or alcohol withdrawals ("Delirium Tremens"), opioid withdrawals are not lethal. Withdrawals are the physical manifestation of the body getting rid of the excess receptors.  Expected Symptoms Early symptoms of withdrawal may include: . Agitation . Anxiety . Muscle aches . Increased tearing . Insomnia . Runny nose . Sweating . Yawning  Late symptoms of withdrawal may include: . Abdominal cramping . Diarrhea . Dilated pupils . Goose bumps . Nausea . Vomiting  Will I experience withdrawals? Due to the slow nature of the taper, it is very unlikely that you will experience any.  What is a slow taper? Taper: refers to the gradual decrease in dose.  (Last update: 04/01/2020) ____________________________________________________________________________________________     

## 2021-08-23 ENCOUNTER — Other Ambulatory Visit: Payer: Self-pay | Admitting: Dermatology

## 2021-08-23 ENCOUNTER — Other Ambulatory Visit: Payer: Self-pay | Admitting: Pain Medicine

## 2021-08-23 DIAGNOSIS — Z79891 Long term (current) use of opiate analgesic: Secondary | ICD-10-CM

## 2021-08-23 DIAGNOSIS — L439 Lichen planus, unspecified: Secondary | ICD-10-CM

## 2021-08-23 DIAGNOSIS — G894 Chronic pain syndrome: Secondary | ICD-10-CM

## 2021-08-23 DIAGNOSIS — Z79899 Other long term (current) drug therapy: Secondary | ICD-10-CM

## 2021-08-25 ENCOUNTER — Other Ambulatory Visit: Payer: Self-pay | Admitting: Dermatology

## 2021-08-25 DIAGNOSIS — L439 Lichen planus, unspecified: Secondary | ICD-10-CM

## 2021-09-21 ENCOUNTER — Other Ambulatory Visit: Payer: Self-pay | Admitting: Pain Medicine

## 2021-09-23 ENCOUNTER — Other Ambulatory Visit: Payer: Self-pay | Admitting: Pain Medicine

## 2021-09-23 DIAGNOSIS — G894 Chronic pain syndrome: Secondary | ICD-10-CM

## 2021-09-23 DIAGNOSIS — Z79899 Other long term (current) drug therapy: Secondary | ICD-10-CM

## 2021-09-23 DIAGNOSIS — Z79891 Long term (current) use of opiate analgesic: Secondary | ICD-10-CM

## 2021-09-23 DIAGNOSIS — G8929 Other chronic pain: Secondary | ICD-10-CM

## 2021-09-23 DIAGNOSIS — M25552 Pain in left hip: Secondary | ICD-10-CM

## 2021-09-23 DIAGNOSIS — M79602 Pain in left arm: Secondary | ICD-10-CM

## 2021-09-23 DIAGNOSIS — M5442 Lumbago with sciatica, left side: Secondary | ICD-10-CM

## 2021-09-23 DIAGNOSIS — M5459 Other low back pain: Secondary | ICD-10-CM

## 2021-09-23 DIAGNOSIS — M542 Cervicalgia: Secondary | ICD-10-CM

## 2021-09-23 DIAGNOSIS — M797 Fibromyalgia: Secondary | ICD-10-CM

## 2021-09-23 DIAGNOSIS — M47812 Spondylosis without myelopathy or radiculopathy, cervical region: Secondary | ICD-10-CM

## 2021-10-14 ENCOUNTER — Ambulatory Visit (INDEPENDENT_AMBULATORY_CARE_PROVIDER_SITE_OTHER): Payer: Medicare Other | Admitting: Dermatology

## 2021-10-14 ENCOUNTER — Other Ambulatory Visit: Payer: Self-pay

## 2021-10-14 DIAGNOSIS — L820 Inflamed seborrheic keratosis: Secondary | ICD-10-CM | POA: Diagnosis not present

## 2021-10-14 DIAGNOSIS — L578 Other skin changes due to chronic exposure to nonionizing radiation: Secondary | ICD-10-CM

## 2021-10-14 DIAGNOSIS — L821 Other seborrheic keratosis: Secondary | ICD-10-CM

## 2021-10-14 DIAGNOSIS — D0472 Carcinoma in situ of skin of left lower limb, including hip: Secondary | ICD-10-CM | POA: Diagnosis not present

## 2021-10-14 DIAGNOSIS — D485 Neoplasm of uncertain behavior of skin: Secondary | ICD-10-CM

## 2021-10-14 DIAGNOSIS — L439 Lichen planus, unspecified: Secondary | ICD-10-CM | POA: Diagnosis not present

## 2021-10-14 DIAGNOSIS — D099 Carcinoma in situ, unspecified: Secondary | ICD-10-CM

## 2021-10-14 HISTORY — DX: Carcinoma in situ, unspecified: D09.9

## 2021-10-14 MED ORDER — AMOXICILLIN 250 MG PO CAPS: 250.0000 mg | ORAL_CAPSULE | Freq: Two times a day (BID) | ORAL | 2 refills | Status: DC

## 2021-10-14 MED ORDER — OPZELURA 1.5 % EX CREA: 1.0000 "application " | TOPICAL_CREAM | Freq: Every day | CUTANEOUS | 2 refills | Status: DC

## 2021-10-14 NOTE — Patient Instructions (Signed)

## 2021-10-14 NOTE — Progress Notes (Signed)
Follow-Up Visit   Subjective  Zoe Sanchez is a 66 y.o. female who presents for the following: Follow-up (Lichen planus f/u - some new spots on her right leg. Treating with amoxicillin 250 mg BID. She also has a few spots that she wants checked today that have been bothering her. ).  The following portions of the chart were reviewed this encounter and updated as appropriate:  Tobacco   Allergies   Meds   Problems   Med Hx   Surg Hx   Fam Hx      Review of Systems: No other skin or systemic complaints except as noted in HPI or Assessment and Plan.  Objective  Well appearing patient in no apparent distress; mood and affect are within normal limits.  A focused examination was performed including legs, chest, back, face. Relevant physical exam findings are noted in the Assessment and Plan.  right pretibial     Left distal anterior lateral thigh 0.8 cm x 0.5 cm red patch      Left lateral thigh x 1, upper back spinal x 1, chest x 10, back x 5 (17) Erythematous keratotic or waxy stuck-on papule or plaque.    Assessment & Plan  Lichen planus All over skin - generalized Chronic and persistent condition with duration or expected duration over one year. Condition is symptomatic / bothersome to patient. Not to goal. Improved on oral Amoxicillin according to patient - she flares if not on Amoxicillin.  She definitely is better than in past.  Continue Amoxicillin 250 mg 1 po bid. Start Opzelura cream. Apply to affected areas qd.   Opzelura Samples given today.  Lot:[id number] Exp:03/12/23 Sent to pharmacy. Advised pt to try samples to see if they help before trying to pick up as it may not be covered and could be expensive.  Ruxolitinib Phosphate (OPZELURA) 1.5 % CREA - right pretibial Apply 1 application topically daily. Related Medications amoxicillin (AMOXIL) 250 MG capsule Take 1 capsule (250 mg total) by mouth 2 (two) times daily.  Neoplasm of uncertain behavior of  skin Left distal anterior lateral thigh Epidermal / dermal shaving  Lesion diameter (cm):  0.8 Informed consent: discussed and consent obtained   Timeout: patient name, date of birth, surgical site, and procedure verified   Procedure prep:  Patient was prepped and draped in usual sterile fashion Prep type:  Isopropyl alcohol Anesthesia: the lesion was anesthetized in a standard fashion   Anesthetic:  1% lidocaine w/ epinephrine 1-100,000 buffered w/ 8.4% NaHCO3 Instrument used: flexible razor blade   Hemostasis achieved with: pressure, aluminum chloride and electrodesiccation   Outcome: patient tolerated procedure well   Post-procedure details: sterile dressing applied and wound care instructions given   Dressing type: bandage and petrolatum    Destruction of lesion Complexity: extensive   Destruction method: electrodesiccation and curettage   Informed consent: discussed and consent obtained   Timeout:  patient name, date of birth, surgical site, and procedure verified Procedure prep:  Patient was prepped and draped in usual sterile fashion Prep type:  Isopropyl alcohol Anesthesia: the lesion was anesthetized in a standard fashion   Anesthetic:  1% lidocaine w/ epinephrine 1-100,000 buffered w/ 8.4% NaHCO3 Curettage performed in three different directions: Yes   Electrodesiccation performed over the curetted area: Yes   Lesion length (cm):  0.8 Lesion width (cm):  0.5 Margin per side (cm):  0.2 Final wound size (cm):  1.2 Hemostasis achieved with:  pressure and aluminum chloride Outcome: patient tolerated  procedure well with no complications   Post-procedure details: sterile dressing applied and wound care instructions given   Dressing type: bandage and petrolatum    Specimen 1 - Surgical pathology Differential Diagnosis: r/o BCC Check Margins: Yes  Inflamed seborrheic keratosis (17) Left lateral thigh x 1, upper back spinal x 1, chest x 10, back x 5 Prior to procedure,  discussed risks of blister formation, small wound, skin dyspigmentation, or rare scar following cryotherapy. Recommend Vaseline ointment to treated areas while healing.  Destruction of lesion - Left lateral thigh x 1, upper back spinal x 1, chest x 10, back x 5 Complexity: simple   Destruction method: cryotherapy   Informed consent: discussed and consent obtained   Timeout:  patient name, date of birth, surgical site, and procedure verified Lesion destroyed using liquid nitrogen: Yes   Region frozen until ice ball extended beyond lesion: Yes   Outcome: patient tolerated procedure well with no complications   Post-procedure details: wound care instructions given    Seborrheic Keratoses - Stuck-on, waxy, tan-brown papules and/or plaques  - Benign-appearing - Discussed benign etiology and prognosis. - Observe - Call for any changes  Actinic Damage - chronic, secondary to cumulative UV radiation exposure/sun exposure over time - diffuse scaly erythematous macules with underlying dyspigmentation - Recommend daily broad spectrum sunscreen SPF 30+ to sun-exposed areas, reapply every 2 hours as needed.  - Recommend staying in the shade or wearing long sleeves, sun glasses (UVA+UVB protection) and wide brim hats (4-inch brim around the entire circumference of the hat). - Call for new or changing lesions.  Return in about 6 months (around 11/18/4534) for lichen planus.  IHarriett Sine, CMA, am acting as scribe for Sarina Ser, MD. Documentation: I have reviewed the above documentation for accuracy and completeness, and I agree with the above.  Sarina Ser, MD

## 2021-10-17 ENCOUNTER — Encounter: Payer: Self-pay | Admitting: Dermatology

## 2021-10-19 ENCOUNTER — Telehealth: Payer: Self-pay

## 2021-10-19 NOTE — Telephone Encounter (Signed)
LM on VM please return my call  

## 2021-10-19 NOTE — Telephone Encounter (Signed)
-----   Message from Ralene Bathe, MD sent at 10/19/2021  9:30 AM EST ----- Diagnosis Skin , left distal anterior lateral thigh SQUAMOUS CELL CARCINOMA IN SITU, CLOSE TO MARGIN  Cancer - SCC in situ Superficial Already treated Recheck next visit

## 2021-11-03 ENCOUNTER — Telehealth: Payer: Self-pay

## 2021-11-03 NOTE — Telephone Encounter (Addendum)
°  Called and discussed results with patient's mother. She verbalized understanding and denied further questions at this time.    ----- Message from Ralene Bathe, MD sent at 10/19/2021  9:30 AM EST ----- Diagnosis Skin , left distal anterior lateral thigh SQUAMOUS CELL CARCINOMA IN SITU, CLOSE TO MARGIN  Cancer - SCC in situ Superficial Already treated Recheck next visit

## 2021-11-08 NOTE — Progress Notes (Signed)
PROVIDER NOTE: Information contained herein reflects review and annotations entered in association with encounter. Interpretation of such information and data should be left to medically-trained personnel. Information provided to patient can be located elsewhere in the medical record under "Patient Instructions". Document created using STT-dictation technology, any transcriptional errors that may result from process are unintentional.    Patient: Zoe Sanchez  Service Category: E/M  Provider: Gaspar Cola, MD  DOB: 16-Mar-1956  DOS: 11/10/2021  Specialty: Interventional Pain Management  MRN: [id number]  Setting: Ambulatory outpatient  PCP: Denton Lank, MD  Type: Established Patient    Referring Provider: Denton Lank, MD  Location: Office  Delivery: Face-to-face     HPI  Ms. JANAYLA MARIK, a 66 y.o. year old female, is here today because of her Chronic pain syndrome [G89.4]. Ms. Halley primary complain today is Back Pain (lower) Last encounter: My last encounter with her was on 09/23/2021. Pertinent problems: Ms. Sanks has Myofascial pain syndrome; Cervical facet syndrome (Bilateral) (R>L); Chronic neck pain (4th area of Pain) (Bilateral) (R>L); Chronic pain syndrome; Cervical spondylosis; Fibromyalgia; Cervical herniated disc (C5-6 and C6-7); Cervical foraminal stenosis (Bilateral) (C5-6); Chronic cervical radicular pain (Bilateral) (R>L) (C8 Dermatome); Chronic low back pain (1ry area of Pain) (Bilateral) (L>R) w/ sciatica (Bilateral) (L>R); Spondylosis of lumbar spine; Family history of chronic pain; Carpal tunnel syndrome (Left); Chronic shoulder pain (Bilateral); Cervical facet arthropathy; Lumbar facet arthropathy; Bimalleolar fracture of left ankle; Lumbar facet syndrome (Bilateral) (R>L); Chronic upper extremity pain (5th area of Pain) (Bilateral) (R>L); L4-5 Lumbar Facet joint Synovial cyst; Chronic lower extremity pain (2ry area of Pain) (Bilateral) (L>R); Chronic lumbar radicular  pain (Bilateral) (S1); Arthropathy of lumbar facet joint; Cervical syndrome; Lumbar spondylosis; Synovial cyst of lumbar spine; Prolapsed cervical intervertebral disc; Lumbar facet joint pain; Cervical radiculopathy; Stenosis of intervertebral foramina; Chronic hip pain (3ry area of Pain) (Bilateral); Shoulder pain; Chronic musculoskeletal pain; Neurogenic pain; Closed trimalleolar fracture of left ankle; Bilateral lower extremity edema; Chronic pain of multiple sites; Closed fracture of fifth metatarsal bone; Lumbosacral sensory radiculopathy at S1 (Bilateral); Decreased range of motion of lumbar spine; and Numbness and tingling of lower extremity (Bilateral) on their pertinent problem list. Pain Assessment: Severity of Chronic pain is reported as a 3 /10. Location: Back Right, Left/Pain radities scross her hip. Onset: More than a month ago. Quality: Aching, Constant. Timing: Constant. Modifying factor(s): sit down or lay down, meds. Vitals:  height is _0  (1.626 m) and weight is 165 lb (74.8 kg). Her temperature is 96.7 F (35.9 C) (abnormal). Her blood pressure is 171/83 (abnormal) and her pulse is 72. Her oxygen saturation is 99%.   Reason for encounter: medication management.   The patient indicates doing well with the current medication regimen. No adverse reactions or side effects reported to the medications.   RTCB: 02/19/2022 Nonopioids transferred 08/24/2020: Magnesium, Flexeril, Lyrica, melatonin, vitamin B12, and vitamin D3.  Pharmacotherapy Assessment  Analgesic: Hydrocodone/APAP 5/325, 1 tab PO q 4 hrs (30 mg/day of hydrocodone)(1,950 mg/day of acetaminophen) MME/day: 30 mg/day.   Monitoring: [place] PMP: PDMP reviewed during this encounter.       Pharmacotherapy: No side-effects or adverse reactions reported. Compliance: No problems identified. Effectiveness: Clinically acceptable.  Chauncey Fischer, RN  11/10/2021  1:34 PM  Sign when Signing Visit Nursing Pain Medication Assessment:   Safety precautions to be maintained throughout the outpatient stay will include: orient to surroundings, keep bed in low position, maintain call bell within reach at all times,  provide assistance with transfer out of bed and ambulation.  Medication Inspection Compliance: Pill count conducted under aseptic conditions, in front of the patient. Neither the pills nor the bottle was removed from the patient's sight at any time. Once count was completed pills were immediately returned to the patient in their original bottle.  Medication: Hydrocodone/APAP Pill/Patch Count:  121 of 180 pills remain Pill/Patch Appearance: Markings consistent with prescribed medication Bottle Appearance: Standard pharmacy container. Clearly labeled. Filled Date: 2 / 10 / 2023 Last Medication intake:  TodaySafety precautions to be maintained throughout the outpatient stay will include: orient to surroundings, keep bed in low position, maintain call bell within reach at all times, provide assistance with transfer out of bed and ambulation.     UDS:  Summary  Date Value Ref Range Status  02/17/2021 Note  Final    Comment:    ==================================================================== [employer] 13 (MW) ==================================================================== Test                             Result       Flag       Units  Drug Present and Declared for Prescription Verification   Hydrocodone                    >6803        EXPECTED   ng/mg creat   Hydromorphone                  477          EXPECTED   ng/mg creat   Dihydrocodeine                 1059         EXPECTED   ng/mg creat   Norhydrocodone                 2802         EXPECTED   ng/mg creat    Sources of hydrocodone include scheduled prescription medications.    Hydromorphone, dihydrocodeine and norhydrocodone are expected    metabolites of hydrocodone. Hydromorphone and dihydrocodeine are    also available as scheduled prescription  medications.  Drug Present not Declared for Prescription Verification   Oxazepam                       18           UNEXPECTED ng/mg creat    Oxazepam may be administered as a scheduled prescription medication;    it is also an expected metabolite of other benzodiazepine drugs,    including diazepam, chlordiazepoxide, prazepam, clorazepate,    halazepam, and temazepam.  ==================================================================== Test                      Result    Flag   Units      Ref Range   Creatinine              147              mg/dL      >=20 ==================================================================== Declared Medications:  The flagging and interpretation on this report are based on the  following declared medications.  Unexpected results may arise from  inaccuracies in the declared medications.   **Note: The testing scope of this panel includes these medications:   Hydrocodone (Norco)   **Note: The testing scope of  this panel does not include the  following reported medications:   Acetaminophen (Norco)  Albuterol  Amitriptyline (Elavil)  Amoxicillin  Cholecalciferol  Cyanocobalamin  Cyclobenzaprine (Flexeril)  Dicyclomine (Bentyl)  Estradiol  Fluticasone (Flonase)  Magnesium  Melatonin  Montelukast (Singulair)  Omeprazole  Pregabalin (Lyrica)  Propranolol  Rosuvastatin  Umeclidinium (Incruse Ellipta)  Zolpidem (Ambien) ==================================================================== For clinical consultation, please call [phone]. ====================================================================      ROS  Constitutional: Denies any fever or chills Gastrointestinal: No reported hemesis, hematochezia, vomiting, or acute GI distress Musculoskeletal: Denies any acute onset joint swelling, redness, loss of ROM, or weakness Neurological: No reported episodes of acute onset apraxia, aphasia, dysarthria, agnosia, amnesia, paralysis,  loss of coordination, or loss of consciousness  Medication Review  HYDROcodone-acetaminophen, Melatonin, Ruxolitinib Phosphate, Vitamin D3, albuterol, amitriptyline, amoxicillin, cyanocobalamin, cyclobenzaprine, dicyclomine, estradiol, fluticasone, magnesium gluconate, montelukast, omeprazole, pregabalin, propranolol, rosuvastatin, salmeterol, umeclidinium bromide, and zolpidem  History Review  Allergy: Ms. Staiger is allergic to prednisone. Drug: Ms. Pawelski  reports no history of drug use. Alcohol:  reports no history of alcohol use. Tobacco:  reports that she has never smoked. She has never used smokeless tobacco. Social: Ms. Payano  reports that she has never smoked. She has never used smokeless tobacco. She reports that she does not drink alcohol and does not use drugs. Medical:  has a past medical history of Abnormal heart rhythm, Abnormal mammogram (02/25/2014), Actinic keratosis, Anemia, Anxiety, Asthma, Awareness of heartbeats (06/29/2015), Basal cell carcinoma (04/24/2008), Basal cell carcinoma (04/09/2009), Basal cell carcinoma (BCC) (12/23/2014), Breath shortness (06/29/2015), CAD (coronary artery disease), Cancer (HCC), Cervical radicular pain (Location of Secondary source of pain) (Bilateral) (R>L) (C8 Dermatome) (06/29/2015), Chronic pain, COPD (chronic obstructive pulmonary disease) ([place]), DDD (degenerative disc disease), cervical, Degenerative disk disease, Depression, Dysrhythmia, Endometriosis, Family history of chronic pain (06/29/2015), Fibromyalgia, Fibromyalgia, GERD (gastroesophageal reflux disease), Heart murmur, Hiatal hernia, History of attempted suicide (06/29/2015), History of hiatal hernia (06/29/2015), History of psychiatric disorder (06/29/2015), History of suicidal ideation (06/29/2015), Hyperlipidemia, Insomnia, Insomnia, Irritable bowel syndrome, Squamous cell carcinoma in situ (10/14/2021), Squamous cell carcinoma of skin (03/08/2007), Squamous cell carcinoma of trunk  (04/24/2008), and Squamous cell carcinoma of trunk (05/28/2009). Surgical: Ms. Meador  has a past surgical history that includes Abdominal hysterectomy (1986); Tonsillectomy (1957); Skin lesion excision; Oophorectomy (1986); Breast surgery; ORIF ankle fracture (Left, 03/14/2018); ORIF ankle fracture (Left, 07/26/2018); Ankle surgery; Colonoscopy with propofol (N/A, 06/05/2019); Breast biopsy (Right, 2016); Fracture surgery; and Colonoscopy with propofol (N/A, 03/05/2020). Family: family history includes Breast cancer in her maternal aunt; Heart disease in her father; Hypertension in her father and mother.  Laboratory Chemistry Profile   Renal Lab Results  Component Value Date   BUN 21 04/26/2019   CREATININE 0.84 04/26/2019   GFRAA >60 04/26/2019   GFRNONAA >60 04/26/2019    Hepatic Lab Results  Component Value Date   AST 35 04/26/2019   ALT 22 04/26/2019   ALBUMIN 4.1 04/26/2019   ALKPHOS 71 04/26/2019    Electrolytes Lab Results  Component Value Date   NA 135 04/26/2019   K 4.5 04/26/2019   CL 103 04/26/2019   CALCIUM 9.4 04/26/2019   MG 1.8 04/26/2019    Bone Lab Results  Component Value Date   VD25OH 40.8 04/26/2019   25OHVITD1 49 03/09/2016   25OHVITD2 <1.0 03/09/2016   25OHVITD3 48 03/09/2016    Inflammation (CRP: Acute Phase) (ESR: Chronic Phase) Lab Results  Component Value Date   CRP 2.2 (H) 04/26/2019   ESRSEDRATE 39 (  H) 04/26/2019         Note: Above Lab results reviewed.  Recent Imaging Review  MR LUMBAR SPINE WO CONTRAST CLINICAL DATA:  Osteoarthritis, lumbosacral Low back pain, > 6 wks Lumbar radiculopathy, > 6 wks  EXAM: MRI LUMBAR SPINE WITHOUT CONTRAST  TECHNIQUE: Multiplanar, multisequence MR imaging of the lumbar spine was performed. No intravenous contrast was administered.  COMPARISON:  May 2018  FINDINGS: Segmentation:  Standard.  Alignment:  Levocurvature.  Anteroposterior alignment preserved.  Vertebrae: Stable vertebral body  heights. No marrow edema. No suspicious osseous lesion.  Conus medullaris and cauda equina: Conus extends to the L1 level. Conus and cauda equina appear normal.  Paraspinal and other soft tissues: Unremarkable.  Disc levels:  L1-L2:  Trace disc bulge.  No canal or foraminal stenosis.  L2-L3: Trace disc bulge with superimposed small left foraminal protrusion. No canal or foraminal stenosis.  L3-L4: Trace disc bulge with superimposed small left foraminal protrusion. Mild facet arthropathy with ligamentum flavum infolding. No canal stenosis. Minimal left subarticular recess narrowing. No right foraminal stenosis. Minimal left foraminal stenosis.  L4-L5: Moderate facet arthropathy with ligamentum flavum infolding. Facet synovial cyst is no longer present. No canal or foraminal stenosis.  L5-S1:  Moderate facet arthropathy.  No canal or foraminal stenosis.  IMPRESSION: Overall mild degenerative changes as detailed above without progression since 2018 examination. Lower lumbar facet arthropathy.  Electronically Signed   By: Macy Mis M.D.   On: 06/05/2021 15:05 Note: Reviewed        Physical Exam  General appearance: Well nourished, well developed, and well hydrated. In no apparent acute distress Mental status: Alert, oriented x 3 (person, place, & time)       Respiratory: No evidence of acute respiratory distress Eyes: PERLA Vitals: BP (!) 171/83    Pulse 72    Temp (!) 96.7 F (35.9 C)    Ht _0  (1.626 m)    Wt 165 lb (74.8 kg)    SpO2 99%    BMI 28.32 kg/m  BMI: Estimated body mass index is 28.32 kg/m as calculated from the following:   Height as of this encounter: _1  (1.626 m).   Weight as of this encounter: 165 lb (74.8 kg). Ideal: Ideal body weight: 54.7 kg (120 lb 9.5 oz) Adjusted ideal body weight: 62.8 kg (138 lb 5.7 oz)  Assessment   Status Diagnosis  Controlled Controlled Controlled 1. Chronic pain syndrome   2. Chronic low back pain (1ry area of  Pain) (Bilateral) (L>R) w/ sciatica (Bilateral) (L>R)   3. Chronic lower extremity pain (2ry area of Pain) (Bilateral) (L>R)   4. Chronic hip pain (3ry area of Pain) (Bilateral)   5. Chronic neck pain (4th area of Pain) (Bilateral) (R>L)   6. Chronic upper extremity pain (5th area of Pain) (Bilateral) (R>L)   7. Lumbar facet joint pain   8. Cervical facet syndrome (Bilateral) (R>L)   9. Fibromyalgia   10. Pharmacologic therapy   11. Chronic use of opiate for therapeutic purpose   12. Encounter for medication management   13. Encounter for chronic pain management      Updated Problems: No problems updated.  Plan of Care  Problem-specific:  No problem-specific Assessment & Plan notes found for this encounter.  Ms. TYKIA MELLONE has a current medication list which includes the following long-term medication(s): albuterol, amitriptyline, dicyclomine, estradiol, fluticasone, montelukast, omeprazole, propranolol, rosuvastatin, salmeterol, vitamin d3, cyanocobalamin, cyclobenzaprine, [START ON 11/21/2021] hydrocodone-acetaminophen, [START ON 12/21/2021] hydrocodone-acetaminophen, [  START ON 01/20/2022] hydrocodone-acetaminophen, magnesium gluconate, melatonin, and pregabalin.  Pharmacotherapy (Medications Ordered): Meds ordered this encounter  Medications   HYDROcodone-acetaminophen (NORCO/VICODIN) 5-325 MG tablet    Sig: Take 1 tablet by mouth every 4 (four) hours as needed for severe pain. Must last 30 days    Dispense:  180 tablet    Refill:  0    DO NOT: delete (not duplicate); no partial-fill (will deny script to complete), no refill request (F/U required). DISPENSE: 1 day early if closed on fill date. WARN: No CNS-depressants within 8 hrs of med.   HYDROcodone-acetaminophen (NORCO/VICODIN) 5-325 MG tablet    Sig: Take 1 tablet by mouth every 4 (four) hours as needed for severe pain. Must last 30 days    Dispense:  180 tablet    Refill:  0    DO NOT: delete (not duplicate); no  partial-fill (will deny script to complete), no refill request (F/U required). DISPENSE: 1 day early if closed on fill date. WARN: No CNS-depressants within 8 hrs of med.   HYDROcodone-acetaminophen (NORCO/VICODIN) 5-325 MG tablet    Sig: Take 1 tablet by mouth every 4 (four) hours as needed for severe pain. Must last 30 days    Dispense:  180 tablet    Refill:  0    DO NOT: delete (not duplicate); no partial-fill (will deny script to complete), no refill request (F/U required). DISPENSE: 1 day early if closed on fill date. WARN: No CNS-depressants within 8 hrs of med.   Orders:  No orders of the defined types were placed in this encounter.  Follow-up plan:   Return in about 3 months (around 02/19/2022) for Eval-day (M,W), (F2F), (MM).     Interventional Therapies  Risk   Complexity Considerations:   Estimated body mass index is 29.23 kg/m as calculated from the following:   Height as of this encounter: _0  (1.6 m).   Weight as of this encounter: 165 lb (74.8 kg). WNL   Planned   Pending:   none   Under consideration:   Diagnostic bilateral cervical facet MBB  Diagnostic right CESI  Diagnostic bilateral IA shoulder joint injection  Diagnostic bilateral suprascapular NB  Possible bilateral lumbar facet RFA  Diagnostic left carpal tunnel injection    Completed:   Diagnostic bilateral lumbar facet MBB x2 (w/o steroids) (04/20/2017) (100/60/60/<25)    Therapeutic   Palliative (PRN) options:   none    Recent Visits Date Type Provider Dept  08/16/21 Office Visit Milinda Pointer, MD [facility]  Showing recent visits within past 90 days and meeting all other requirements Today's Visits Date Type Provider Dept  11/10/21 Office Visit Milinda Pointer, MD [facility]  Showing today's visits and meeting all other requirements Future Appointments No visits were found meeting these conditions. Showing future appointments within next 90 days and meeting all  other requirements  I discussed the assessment and treatment plan with the patient. The patient was provided an opportunity to ask questions and all were answered. The patient agreed with the plan and demonstrated an understanding of the instructions.  Patient advised to call back or seek an in-person evaluation if the symptoms or condition worsens.  Duration of encounter: 30 minutes.  Note by: Gaspar Cola, MD Date: 11/10/2021; Time: 1:40 PM

## 2021-11-10 ENCOUNTER — Ambulatory Visit: Payer: Medicare Other | Attending: Pain Medicine | Admitting: Pain Medicine

## 2021-11-10 ENCOUNTER — Other Ambulatory Visit: Payer: Self-pay

## 2021-11-10 ENCOUNTER — Encounter: Payer: Self-pay | Admitting: Pain Medicine

## 2021-11-10 VITALS — BP 171/83 | HR 72 | Temp 96.7°F | Ht 64.0 in | Wt 165.0 lb

## 2021-11-10 DIAGNOSIS — M542 Cervicalgia: Secondary | ICD-10-CM | POA: Insufficient documentation

## 2021-11-10 DIAGNOSIS — M5459 Other low back pain: Secondary | ICD-10-CM | POA: Diagnosis present

## 2021-11-10 DIAGNOSIS — M5442 Lumbago with sciatica, left side: Secondary | ICD-10-CM | POA: Diagnosis present

## 2021-11-10 DIAGNOSIS — M25552 Pain in left hip: Secondary | ICD-10-CM | POA: Insufficient documentation

## 2021-11-10 DIAGNOSIS — M797 Fibromyalgia: Secondary | ICD-10-CM | POA: Insufficient documentation

## 2021-11-10 DIAGNOSIS — M79604 Pain in right leg: Secondary | ICD-10-CM | POA: Diagnosis not present

## 2021-11-10 DIAGNOSIS — M47812 Spondylosis without myelopathy or radiculopathy, cervical region: Secondary | ICD-10-CM | POA: Diagnosis present

## 2021-11-10 DIAGNOSIS — Z79899 Other long term (current) drug therapy: Secondary | ICD-10-CM | POA: Insufficient documentation

## 2021-11-10 DIAGNOSIS — M79605 Pain in left leg: Secondary | ICD-10-CM | POA: Insufficient documentation

## 2021-11-10 DIAGNOSIS — G8929 Other chronic pain: Secondary | ICD-10-CM | POA: Insufficient documentation

## 2021-11-10 DIAGNOSIS — M5441 Lumbago with sciatica, right side: Secondary | ICD-10-CM | POA: Diagnosis present

## 2021-11-10 DIAGNOSIS — M25551 Pain in right hip: Secondary | ICD-10-CM | POA: Insufficient documentation

## 2021-11-10 DIAGNOSIS — M79601 Pain in right arm: Secondary | ICD-10-CM | POA: Insufficient documentation

## 2021-11-10 DIAGNOSIS — Z79891 Long term (current) use of opiate analgesic: Secondary | ICD-10-CM | POA: Insufficient documentation

## 2021-11-10 DIAGNOSIS — G894 Chronic pain syndrome: Secondary | ICD-10-CM | POA: Insufficient documentation

## 2021-11-10 DIAGNOSIS — M79602 Pain in left arm: Secondary | ICD-10-CM | POA: Diagnosis present

## 2021-11-10 MED ORDER — HYDROCODONE-ACETAMINOPHEN 5-325 MG PO TABS: 1.0000 | ORAL_TABLET | ORAL | 0 refills | Status: DC | PRN

## 2021-11-10 NOTE — Patient Instructions (Signed)
____________________________________________________________________________________________ ° °Medication Rules ° °Purpose: To inform patients, and their family members, of our rules and regulations. ° °Applies to: All patients receiving prescriptions (written or electronic). ° °Pharmacy of record: Pharmacy where electronic prescriptions will be sent. If written prescriptions are taken to a different pharmacy, please inform the nursing staff. The pharmacy listed in the electronic medical record should be the one where you would like electronic prescriptions to be sent. ° °Electronic prescriptions: In compliance with the [place] Strengthen Opioid Misuse Prevention (STOP) Act of 2017 (Session Law 2017-74/H243), effective September 12, 2018, all controlled substances must be electronically prescribed. Calling prescriptions to the pharmacy will cease to exist. ° °Prescription refills: Only during scheduled appointments. Applies to all prescriptions. ° °NOTE: The following applies primarily to controlled substances (Opioid* Pain Medications).  ° °Type of encounter (visit): For patients receiving controlled substances, face-to-face visits are required. (Not an option or up to the patient.) ° °Patient's responsibilities: °Pain Pills: Bring all pain pills to every appointment (except for procedure appointments). °Pill Bottles: Bring pills in original pharmacy bottle. Always bring the newest bottle. Bring bottle, even if empty. °Medication refills: You are responsible for knowing and keeping track of what medications you take and those you need refilled. °The day before your appointment: write a list of all prescriptions that need to be refilled. °The day of the appointment: give the list to the admitting nurse. Prescriptions will be written only during appointments. No prescriptions will be written on procedure days. °If you forget a medication: it will not be "Called in", "Faxed", or "electronically sent". You will  need to get another appointment to get these prescribed. °No early refills. Do not call asking to have your prescription filled early. °Prescription Accuracy: You are responsible for carefully inspecting your prescriptions before leaving our office. Have the discharge nurse carefully go over each prescription with you, before taking them home. Make sure that your name is accurately spelled, that your address is correct. Check the name and dose of your medication to make sure it is accurate. Check the number of pills, and the written instructions to make sure they are clear and accurate. Make sure that you are given enough medication to last until your next medication refill appointment. °Taking Medication: Take medication as prescribed. When it comes to controlled substances, taking less pills or less frequently than prescribed is permitted and encouraged. °Never take more pills than instructed. °Never take medication more frequently than prescribed.  °Inform other Doctors: Always inform, all of your healthcare providers, of all the medications you take. °Pain Medication from other Providers: You are not allowed to accept any additional pain medication from any other Doctor or Healthcare provider. There are two exceptions to this rule. (see below) In the event that you require additional pain medication, you are responsible for notifying us, as stated below. °Cough Medicine: Often these contain an opioid, such as codeine or hydrocodone. Never accept or take cough medicine containing these opioids if you are already taking an opioid* medication. The combination may cause respiratory failure and death. °Medication Agreement: You are responsible for carefully reading and following our Medication Agreement. This must be signed before receiving any prescriptions from our practice. Safely store a copy of your signed Agreement. Violations to the Agreement will result in no further prescriptions. (Additional copies of our  Medication Agreement are available upon request.) °Laws, Rules, & Regulations: All patients are expected to follow all Federal and State Laws, Statutes, Rules, & Regulations. Ignorance of   the Laws does not constitute a valid excuse.  °Illegal drugs and Controlled Substances: The use of illegal substances (including, but not limited to marijuana and its derivatives) and/or the illegal use of any controlled substances is strictly prohibited. Violation of this rule may result in the immediate and permanent discontinuation of any and all prescriptions being written by our practice. The use of any illegal substances is prohibited. °Adopted CDC guidelines & recommendations: Target dosing levels will be at or below 60 MME/day. Use of benzodiazepines** is not recommended. ° °Exceptions: There are only two exceptions to the rule of not receiving pain medications from other Healthcare Providers. °Exception #1 (Emergencies): In the event of an emergency (i.e.: accident requiring emergency care), you are allowed to receive additional pain medication. However, you are responsible for: As soon as you are able, call our office [phone], at any time of the day or night, and leave a message stating your name, the date and nature of the emergency, and the name and dose of the medication prescribed. In the event that your call is answered by a member of our staff, make sure to document and save the date, time, and the name of the person that took your information.  °Exception #2 (Planned Surgery): In the event that you are scheduled by another doctor or dentist to have any type of surgery or procedure, you are allowed (for a period no longer than 30 days), to receive additional pain medication, for the acute post-op pain. However, in this case, you are responsible for picking up a copy of our "Post-op Pain Management for Surgeons" handout, and giving it to your surgeon or dentist. This document is available at our office, and  does not require an appointment to obtain it. Simply go to our office during business hours (Monday-Thursday from 8:00 AM to 4:00 PM) (Friday 8:00 AM to 12:00 Noon) or if you have a scheduled appointment with us, prior to your surgery, and ask for it by name. In addition, you are responsible for: calling our office [phone], at any time of the day or night, and leaving a message stating your name, name of your surgeon, type of surgery, and date of procedure or surgery. Failure to comply with your responsibilities may result in termination of therapy involving the controlled substances. °Medication Agreement Violation. Following the above rules, including your responsibilities will help you in avoiding a Medication Agreement Violation (“Breaking your Pain Medication Contract”). ° °*Opioid medications include: morphine, codeine, oxycodone, oxymorphone, hydrocodone, hydromorphone, meperidine, tramadol, tapentadol, buprenorphine, fentanyl, methadone. °**Benzodiazepine medications include: diazepam (Valium), alprazolam (Xanax), clonazepam (Klonopine), lorazepam (Ativan), clorazepate (Tranxene), chlordiazepoxide (Librium), estazolam (Prosom), oxazepam (Serax), temazepam (Restoril), triazolam (Halcion) °(Last updated: 06/09/2021) °____________________________________________________________________________________________ ° ____________________________________________________________________________________________ ° °Medication Recommendations and Reminders ° °Applies to: All patients receiving prescriptions (written and/or electronic). ° °Medication Rules & Regulations: These rules and regulations exist for your safety and that of others. They are not flexible and neither are we. Dismissing or ignoring them will be considered "non-compliance" with medication therapy, resulting in complete and irreversible termination of such therapy. (See document titled "Medication Rules" for more details.) In all conscience,  because of safety reasons, we cannot continue providing a therapy where the patient does not follow instructions. ° °Pharmacy of record:  °Definition: This is the pharmacy where your electronic prescriptions will be sent.  °We do not endorse any particular pharmacy, however, we have experienced problems with [facility] not securing enough medication supply for the community. °We do not restrict you   in your choice of pharmacy. However, once we write for your prescriptions, we will NOT be re-sending more prescriptions to fix restricted supply problems created by your pharmacy, or your insurance.  °The pharmacy listed in the electronic medical record should be the one where you want electronic prescriptions to be sent. °If you choose to change pharmacy, simply notify our nursing staff. ° °Recommendations: °Keep all of your pain medications in a safe place, under lock and key, even if you live alone. We will NOT replace lost, stolen, or damaged medication. °After you fill your prescription, take 1 week's worth of pills and put them away in a safe place. You should keep a separate, properly labeled bottle for this purpose. The remainder should be kept in the original bottle. Use this as your primary supply, until it runs out. Once it's gone, then you know that you have 1 week's worth of medicine, and it is time to come in for a prescription refill. If you do this correctly, it is unlikely that you will ever run out of medicine. °To make sure that the above recommendation works, it is very important that you make sure your medication refill appointments are scheduled at least 1 week before you run out of medicine. To do this in an effective manner, make sure that you do not leave the office without scheduling your next medication management appointment. Always ask the nursing staff to show you in your prescription , when your medication will be running out. Then arrange for the receptionist to get you a return appointment,  at least 7 days before you run out of medicine. Do not wait until you have 1 or 2 pills left, to come in. This is very poor planning and does not take into consideration that we may need to cancel appointments due to bad weather, sickness, or emergencies affecting our staff. °DO NOT ACCEPT A "Partial Fill": If for any reason your pharmacy does not have enough pills/tablets to completely fill or refill your prescription, do not allow for a "partial fill". The law allows the pharmacy to complete that prescription within 72 hours, without requiring a new prescription. If they do not fill the rest of your prescription within those 72 hours, you will need a separate prescription to fill the remaining amount, which we will NOT provide. If the reason for the partial fill is your insurance, you will need to talk to the pharmacist about payment alternatives for the remaining tablets, but again, DO NOT ACCEPT A PARTIAL FILL, unless you can trust your pharmacist to obtain the remainder of the pills within 72 hours. ° °Prescription refills and/or changes in medication(s):  °Prescription refills, and/or changes in dose or medication, will be conducted only during scheduled medication management appointments. (Applies to both, written and electronic prescriptions.) °No refills on procedure days. No medication will be changed or started on procedure days. No changes, adjustments, and/or refills will be conducted on a procedure day. Doing so will interfere with the diagnostic portion of the procedure. °No phone refills. No medications will be "called into the pharmacy". °No Fax refills. °No weekend refills. °No Holliday refills. °No after hours refills. ° °Remember:  °Business hours are:  °Monday to Thursday 8:00 AM to 4:00 PM °Provider's Schedule: °Carollyn Etcheverry, MD - Appointments are:  °Medication management: Monday and Wednesday 8:00 AM to 4:00 PM °Procedure day: Tuesday and Thursday 7:30 AM to 4:00 PM °Bilal Lateef, MD -  Appointments are:  °Medication management: Tuesday and Thursday 8:00   AM to 4:00 PM °Procedure day: Monday and Wednesday 7:30 AM to 4:00 PM °(Last update: 04/01/2020) °____________________________________________________________________________________________ ° ____________________________________________________________________________________________ ° °CBD (cannabidiol) & Delta-8 (Delta-8 tetrahydrocannabinol) WARNING ° °Intro: Cannabidiol (CBD) and tetrahydrocannabinol (THC), are two natural compounds found in plants of the Cannabis genus. They can both be extracted from hemp or cannabis. Hemp and cannabis come from the Cannabis sativa plant. Both compounds interact with your body’s endocannabinoid system, but they have very different effects. CBD does not produce the high sensation associated with cannabis. Delta-8 tetrahydrocannabinol, also known as delta-8 THC, is a psychoactive substance found in the Cannabis sativa plant, of which marijuana and hemp are two varieties. THC is responsible for the high associated with the illicit use of marijuana. ° °Applicable to: All individuals currently taking or considering taking CBD (cannabidiol) and, more important, all patients taking opioid analgesic controlled substances (pain medication). (Example: oxycodone; oxymorphone; hydrocodone; hydromorphone; morphine; methadone; tramadol; tapentadol; fentanyl; buprenorphine; butorphanol; dextromethorphan; meperidine; codeine; etc.) ° °Legal status: CBD remains a Schedule I drug prohibited for any use. CBD is illegal with one exception. In the [place], CBD has a limited Food and Drug Administration (FDA) approval for the treatment of two specific types of epilepsy disorders. Only one CBD product has been approved by the FDA for this purpose: "Epidiolex". FDA is aware that some companies are marketing products containing cannabis and cannabis-derived compounds in ways that violate the Federal Food, Drug and Cosmetic Act  (FD&C Act) and that may put the health and safety of consumers at risk. The FDA, a Federal agency, has not enforced the CBD status since 2018. UPDATE: (10/29/2021) The Drug Enforcement Agency (DEA) issued a letter stating that "delta" cannabinoids, including Delta-8-THCO and Delta-9-THCO, synthetically derived from hemp do not qualify as hemp and will be viewed as Schedule I drugs. (Schedule I drugs, substances, or chemicals are defined as drugs with no currently accepted medical use and a high potential for abuse. Some examples of Schedule I drugs are: heroin, lysergic acid diethylamide (LSD), marijuana (cannabis), 3,4-methylenedioxymethamphetamine (ecstasy), methaqualone, and peyote.) (https://www.dea.gov) ° °Legality: Some manufacturers ship CBD products nationally, which is illegal. Often such products are sold online and are therefore available throughout the country. CBD is openly sold in head shops and health food stores in some states where such sales have not been explicitly legalized. Selling unapproved products with unsubstantiated therapeutic claims is not only a violation of the law, but also can put patients at risk, as these products have not been proven to be safe or effective. Federal illegality makes it difficult to conduct research on CBD. ° °Reference: "FDA Regulation of Cannabis and Cannabis-Derived Products, Including Cannabidiol (CBD)" - https://www.fda.gov/news-events/public-health-focus/fda-regulation-cannabis-and-cannabis-derived-products-including-cannabidiol-cbd ° °Warning: CBD is not FDA approved and has not undergo the same manufacturing controls as prescription drugs.  This means that the purity and safety of available CBD may be questionable. Most of the time, despite manufacturer's claims, it is contaminated with THC (delta-9-tetrahydrocannabinol - the chemical in marijuana responsible for the "HIGH").  When this is the case, the THC contaminant will trigger a positive urine drug  screen (UDS) test for Marijuana (carboxy-THC). Because a positive UDS for any illicit substance is a violation of our medication agreement, your opioid analgesics (pain medicine) may be permanently discontinued. °The FDA recently put out a warning about 5 things that everyone should be aware of regarding Delta-8 THC: °Delta-8 THC products have not been evaluated or approved by the FDA for safe use and may be marketed in ways that put the   public health at risk. °The FDA has received adverse event reports involving delta-8 THC-containing products. °Delta-8 THC has psychoactive and intoxicating effects. °Delta-8 THC manufacturing often involve use of potentially harmful chemicals to create the concentrations of delta-8 THC claimed in the marketplace. The final delta-8 THC product may have potentially harmful by-products (contaminants) due to the chemicals used in the process. Manufacturing of delta-8 THC products may occur in uncontrolled or unsanitary settings, which may lead to the presence of unsafe contaminants or other potentially harmful substances. °Delta-8 THC products should be kept out of the reach of children and pets. ° °MORE ABOUT CBD ° °General Information: CBD was discovered in 1940 and it is a derivative of the cannabis sativa genus plants (Marijuana and Hemp). It is one of the 113 identified substances found in Marijuana. It accounts for up to 40% of the plant's extract. As of 2018, preliminary clinical studies on CBD included research for the treatment of anxiety, movement disorders, and pain. CBD is available and consumed in multiple forms, including inhalation of smoke or vapor, as an aerosol spray, and by mouth. It may be supplied as an oil containing CBD, capsules, dried cannabis, or as a liquid solution. CBD is thought not to be as psychoactive as THC (delta-9-tetrahydrocannabinol - the chemical in marijuana responsible for the "HIGH"). Studies suggest that CBD may interact with different  biological target receptors in the body, including cannabinoid and other neurotransmitter receptors. As of 2018 the mechanism of action for its biological effects has not been determined. ° °Side-effects   Adverse reactions: Dry mouth, diarrhea, decreased appetite, fatigue, drowsiness, malaise, weakness, sleep disturbances, and others. ° °Drug interactions: CBC may interact with other medications such as blood-thinners. Because CBD causes drowsiness on its own, it also increases the drowsiness caused by other medications, including antihistamines (such as Benadryl), benzodiazepines (Xanax, Ativan, Valium), antipsychotics, antidepressants and opioids, as well as alcohol and supplements such as kava, melatonin and St. John's Wort. Be cautious with the following combinations:  ° °Brivaracetam (Briviact) °Brivaracetam is changed and broken down by the body. CBD might decrease how quickly the body breaks down brivaracetam. This might increase levels of brivaracetam in the body. ° °Caffeine °Caffeine is changed and broken down by the body. CBD might decrease how quickly the body breaks down caffeine. This might increase levels of caffeine in the body. ° °Carbamazepine (Tegretol) °Carbamazepine is changed and broken down by the body. CBD might decrease how quickly the body breaks down carbamazepine. This might increase levels of carbamazepine in the body and increase its side effects. ° °Citalopram (Celexa) °Citalopram is changed and broken down by the body. CBD might decrease how quickly the body breaks down citalopram. This might increase levels of citalopram in the body and increase its side effects. ° °Clobazam (Onfi) °Clobazam is changed and broken down by the liver. CBD might decrease how quickly the liver breaks down clobazam. This might increase the effects and side effects of clobazam. ° °Eslicarbazepine (Aptiom) °Eslicarbazepine is changed and broken down by the body. CBD might decrease how quickly the body  breaks down eslicarbazepine. This might increase levels of eslicarbazepine in the body by a small amount. ° °Everolimus (Zostress) °Everolimus is changed and broken down by the body. CBD might decrease how quickly the body breaks down everolimus. This might increase levels of everolimus in the body. ° °Lithium °Taking higher doses of CBD might increase levels of lithium. This can increase the risk of lithium toxicity. ° °Medications changed by the   liver (Cytochrome P450 1A1 (CYP1A1) substrates) °Some medications are changed and broken down by the liver. CBD might change how quickly the liver breaks down these medications. This could change the effects and side effects of these medications. ° °Medications changed by the liver (Cytochrome P450 1A2 (CYP1A2) substrates) °Some medications are changed and broken down by the liver. CBD might change how quickly the liver breaks down these medications. This could change the effects and side effects of these medications. ° °Medications changed by the liver (Cytochrome P450 1B1 (CYP1B1) substrates) °Some medications are changed and broken down by the liver. CBD might change how quickly the liver breaks down these medications. This could change the effects and side effects of these medications. ° °Medications changed by the liver (Cytochrome P450 2A6 (CYP2A6) substrates) °Some medications are changed and broken down by the liver. CBD might change how quickly the liver breaks down these medications. This could change the effects and side effects of these medications. ° °Medications changed by the liver (Cytochrome P450 2B6 (CYP2B6) substrates) °Some medications are changed and broken down by the liver. CBD might change how quickly the liver breaks down these medications. This could change the effects and side effects of these medications. ° °Medications changed by the liver (Cytochrome P450 2C19 (CYP2C19) substrates) °Some medications are changed and broken down by the liver.  CBD might change how quickly the liver breaks down these medications. This could change the effects and side effects of these medications. ° °Medications changed by the liver (Cytochrome P450 2C8 (CYP2C8) substrates) °Some medications are changed and broken down by the liver. CBD might change how quickly the liver breaks down these medications. This could change the effects and side effects of these medications. ° °Medications changed by the liver (Cytochrome P450 2C9 (CYP2C9) substrates) °Some medications are changed and broken down by the liver. CBD might change how quickly the liver breaks down these medications. This could change the effects and side effects of these medications. ° °Medications changed by the liver (Cytochrome P450 2D6 (CYP2D6) substrates) °Some medications are changed and broken down by the liver. CBD might change how quickly the liver breaks down these medications. This could change the effects and side effects of these medications. ° °Medications changed by the liver (Cytochrome P450 2E1 (CYP2E1) substrates) °Some medications are changed and broken down by the liver. CBD might change how quickly the liver breaks down these medications. This could change the effects and side effects of these medications. ° °Medications changed by the liver (Cytochrome P450 3A4 (CYP3A4) substrates) °Some medications are changed and broken down by the liver. CBD might change how quickly the liver breaks down these medications. This could change the effects and side effects of these medications. ° °Medications changed by the liver (Glucuronidated drugs) °Some medications are changed and broken down by the liver. CBD might change how quickly the liver breaks down these medications. This could change the effects and side effects of these medications. ° °Medications that decrease the breakdown of other medications by the liver (Cytochrome P450 2C19 (CYP2C19) inhibitors) °CBD is changed and broken down by the liver.  Some drugs decrease how quickly the liver changes and breaks down CBD. This could change the effects and side effects of CBD. ° °Medications that decrease the breakdown of other medications in the liver (Cytochrome P450 3A4 (CYP3A4) inhibitors) °CBD is changed and broken down by the liver. Some drugs decrease how quickly the liver changes and breaks down CBD. This could change the   effects and side effects of CBD. ° °Medications that increase breakdown of other medications by the liver (Cytochrome P450 3A4 (CYP3A4) inducers) °CBD is changed and broken down by the liver. Some drugs increase how quickly the liver changes and breaks down CBD. This could change the effects and side effects of CBD. ° °Medications that increase the breakdown of other medications by the liver (Cytochrome P450 2C19 (CYP2C19) inducers) °CBD is changed and broken down by the liver. Some drugs increase how quickly the liver changes and breaks down CBD. This could change the effects and side effects of CBD. ° °Methadone (Dolophine) °Methadone is broken down by the liver. CBD might decrease how quickly the liver breaks down methadone. Taking cannabidiol along with methadone might increase the effects and side effects of methadone. ° °Rufinamide (Banzel) °Rufinamide is changed and broken down by the body. CBD might decrease how quickly the body breaks down rufinamide. This might increase levels of rufinamide in the body by a small amount. ° °Sedative medications (CNS depressants) °CBD might cause sleepiness and slowed breathing. Some medications, called sedatives, can also cause sleepiness and slowed breathing. Taking CBD with sedative medications might cause breathing problems and/or too much sleepiness. ° °Sirolimus (Rapamune) °Sirolimus is changed and broken down by the body. CBD might decrease how quickly the body breaks down sirolimus. This might increase levels of sirolimus in the body. ° °Stiripentol (Diacomit) °Stiripentol is changed and  broken down by the body. CBD might decrease how quickly the body breaks down stiripentol. This might increase levels of stiripentol in the body and increase its side effects. ° °Tacrolimus (Prograf) °Tacrolimus is changed and broken down by the body. CBD might decrease how quickly the body breaks down tacrolimus. This might increase levels of tacrolimus in the body. ° °Tamoxifen (Soltamox) °Tamoxifen is changed and broken down by the body. CBD might affect how quickly the body breaks down tamoxifen. This might affect levels of tamoxifen in the body. ° °Topiramate (Topamax) °Topiramate is changed and broken down by the body. CBD might decrease how quickly the body breaks down topiramate. This might increase levels of topiramate in the body by a small amount. ° °Valproate °Valproic acid can cause liver injury. Taking cannabidiol with valproic acid might increase the chance of liver injury. CBD and/or valproic acid might need to be stopped, or the dose might need to be reduced. ° °Warfarin (Coumadin) °CBD might increase levels of warfarin, which can increase the risk for bleeding. CBD and/or warfarin might need to be stopped, or the dose might need to be reduced. ° °Zonisamide °Zonisamide is changed and broken down by the body. CBD might decrease how quickly the body breaks down zonisamide. This might increase levels of zonisamide in the body by a small amount. °(Last update: 11/10/2021) °____________________________________________________________________________________________ ° ____________________________________________________________________________________________ ° °Drug Holidays (Slow) ° °What is a "Drug Holiday"? °Drug Holiday: is the name given to the period of time during which a patient stops taking a medication(s) for the purpose of eliminating tolerance to the drug. ° °Benefits °Improved effectiveness of opioids. °Decreased opioid dose needed to achieve benefits. °Improved pain with lesser  dose. ° °What is tolerance? °Tolerance: is the progressive decreased in effectiveness of a drug due to its repetitive use. With repetitive use, the body gets use to the medication and as a consequence, it loses its effectiveness. This is a common problem seen with opioid pain medications. As a result, a larger dose of the drug is needed to achieve the same effect   that used to be obtained with a smaller dose. ° °How long should a "Drug Holiday" last? °You should stay off of the pain medicine for at least 14 consecutive days. (2 weeks) ° °Should I stop the medicine "cold turkey"? °No. You should always coordinate with your Pain Specialist so that he/she can provide you with the correct medication dose to make the transition as smoothly as possible. ° °How do I stop the medicine? °Slowly. You will be instructed to decrease the daily amount of pills that you take by one (1) pill every seven (7) days. This is called a "slow downward taper" of your dose. For example: if you normally take four (4) pills per day, you will be asked to drop this dose to three (3) pills per day for seven (7) days, then to two (2) pills per day for seven (7) days, then to one (1) per day for seven (7) days, and at the end of those last seven (7) days, this is when the "Drug Holiday" would start.  ° °Will I have withdrawals? °By doing a "slow downward taper" like this one, it is unlikely that you will experience any significant withdrawal symptoms. Typically, what triggers withdrawals is the sudden stop of a high dose opioid therapy. Withdrawals can usually be avoided by slowly decreasing the dose over a prolonged period of time. If you do not follow these instructions and decide to stop your medication abruptly, withdrawals may be possible. ° °What are withdrawals? °Withdrawals: refers to the wide range of symptoms that occur after stopping or dramatically reducing opiate drugs after heavy and prolonged use. Withdrawal symptoms do not occur to  patients that use low dose opioids, or those who take the medication sporadically. Contrary to benzodiazepine (example: Valium, Xanax, etc.) or alcohol withdrawals (“Delirium Tremens”), opioid withdrawals are not lethal. Withdrawals are the physical manifestation of the body getting rid of the excess receptors. ° °Expected Symptoms °Early symptoms of withdrawal may include: °Agitation °Anxiety °Muscle aches °Increased tearing °Insomnia °Runny nose °Sweating °Yawning ° °Late symptoms of withdrawal may include: °Abdominal cramping °Diarrhea °Dilated pupils °Goose bumps °Nausea °Vomiting ° °Will I experience withdrawals? °Due to the slow nature of the taper, it is very unlikely that you will experience any. ° °What is a slow taper? °Taper: refers to the gradual decrease in dose.  °(Last update: 04/01/2020) °____________________________________________________________________________________________ ° °  °

## 2021-11-10 NOTE — Progress Notes (Signed)
Nursing Pain Medication Assessment:  ?Safety precautions to be maintained throughout the outpatient stay will include: orient to surroundings, keep bed in low position, maintain call bell within reach at all times, provide assistance with transfer out of bed and ambulation.  ?Medication Inspection Compliance: Pill count conducted under aseptic conditions, in front of the patient. Neither the pills nor the bottle was removed from the patient's sight at any time. Once count was completed pills were immediately returned to the patient in their original bottle. ? ?Medication: Hydrocodone/APAP ?Pill/Patch Count:  121 of 180 pills remain ?Pill/Patch Appearance: Markings consistent with prescribed medication ?Bottle Appearance: Standard pharmacy container. Clearly labeled. ?Filled Date: 2 / 10 / 2023 ?Last Medication intake:  TodaySafety precautions to be maintained throughout the outpatient stay will include: orient to surroundings, keep bed in low position, maintain call bell within reach at all times, provide assistance with transfer out of bed and ambulation.  ?

## 2021-12-09 IMAGING — MR MR LUMBAR SPINE W/O CM
4 of 5 series · 33 of 48 positions shown · non-contrast
Comparison: January 2017

CLINICAL DATA: Osteoarthritis, lumbosacral Low back pain, > 6 wks
Lumbar radiculopathy, > 6 wks

EXAM:
MRI LUMBAR SPINE WITHOUT CONTRAST
TECHNIQUE: Multiplanar, multisequence MR imaging of the lumbar spine was
performed. No intravenous contrast was administered.

[Series 5: T2 · sagittal · 4.0mm · 0.81mm/px · 8 of 17 slices shown (1 of 2)]
[im 1/17]
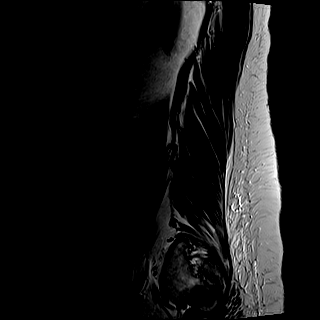
[im 3/17]
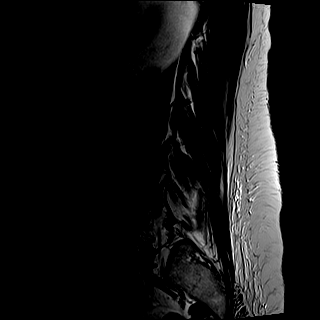
[im 5/17]
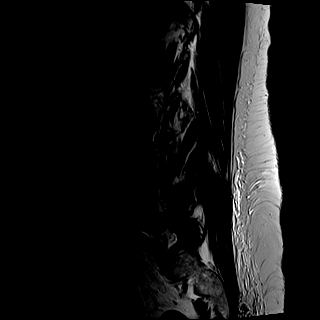
[im 7/17]
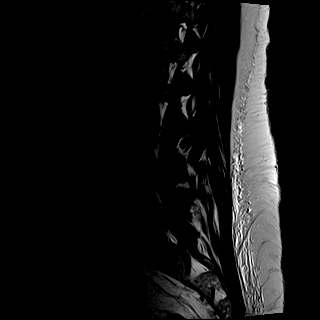
[im 10/17]
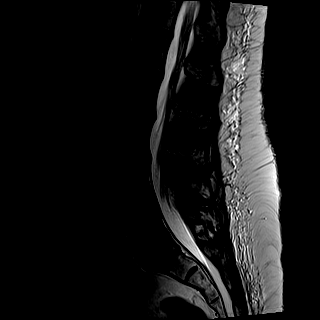
[im 12/17]
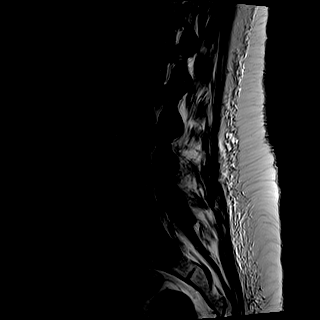
[im 14/17]
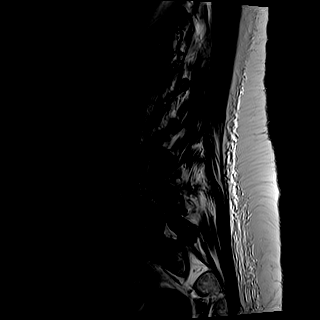
[im 17/17]
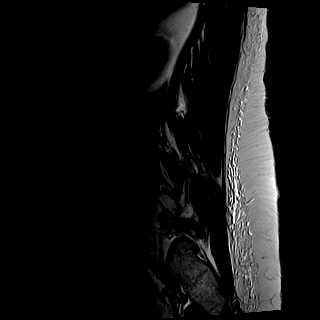

[Series 6: T1 · sagittal · 4.0mm · 0.81mm/px · 7 of 17 slices shown (1 of 2)]
[im 1/17]
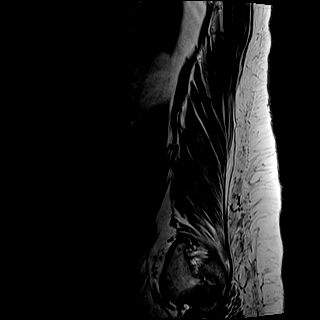
[im 3/17]
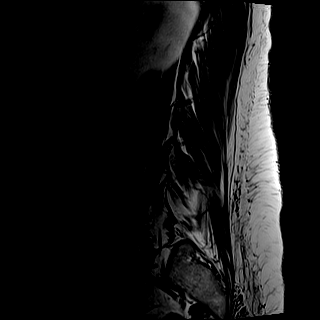
[im 6/17]
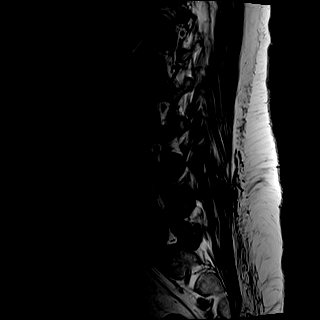
[im 9/17]
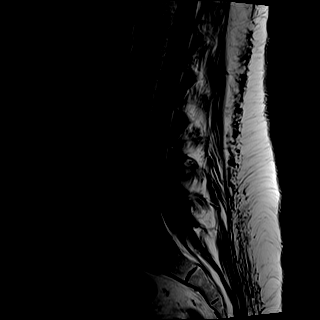
[im 11/17]
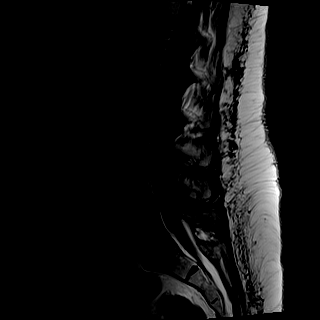
[im 14/17]
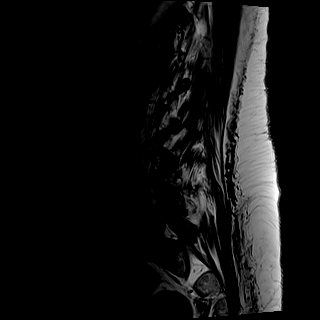
[im 17/17]
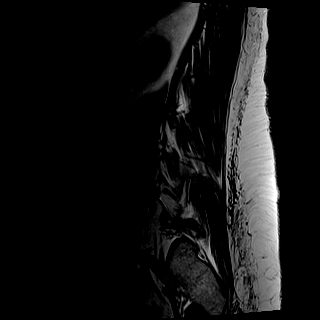

[Series 8: T2 · axial · 4.0mm · 0.78mm/px · z∈[-138,+53]mm · 9 of 29 slices shown (2 of 2)]
[im 1/29]
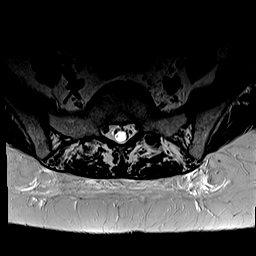
[im 5/29]
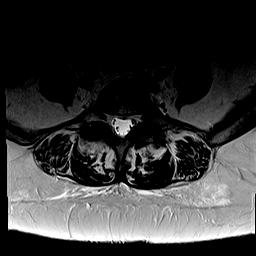
[im 10/29]
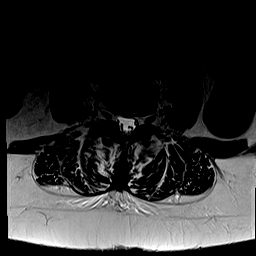
[im 12/29]
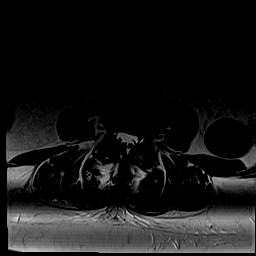
[im 15/29]
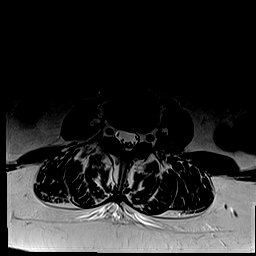
[im 17/29]
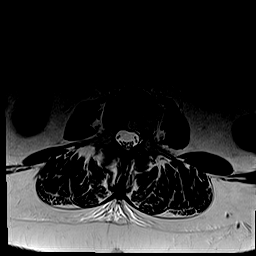
[im 19/29]
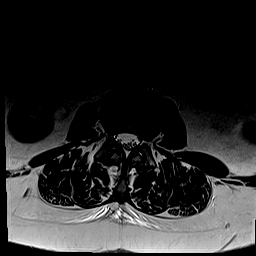
[im 24/29]
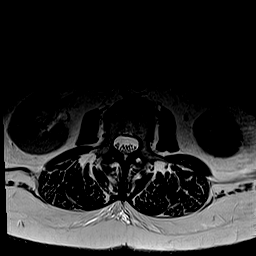
[im 29/29]
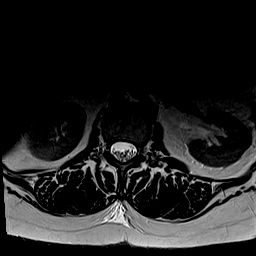

[Series 9: T1 · axial · 4.0mm · 0.39mm/px · z∈[-138,+53]mm · 9 of 29 slices shown (2 of 2)]
[im 1/29]
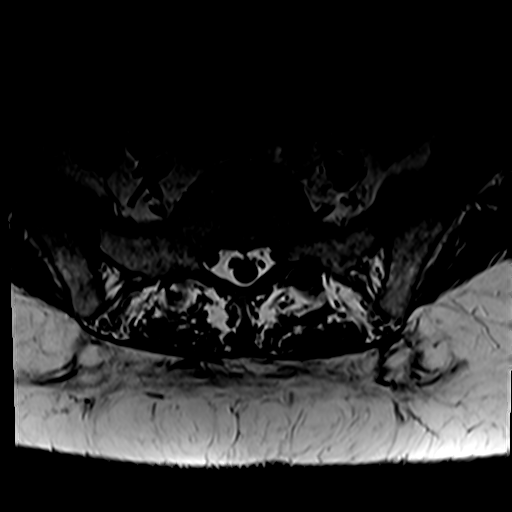
[im 5/29]
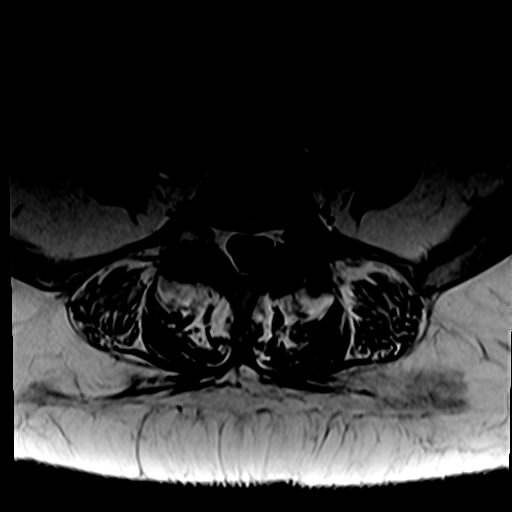
[im 10/29]
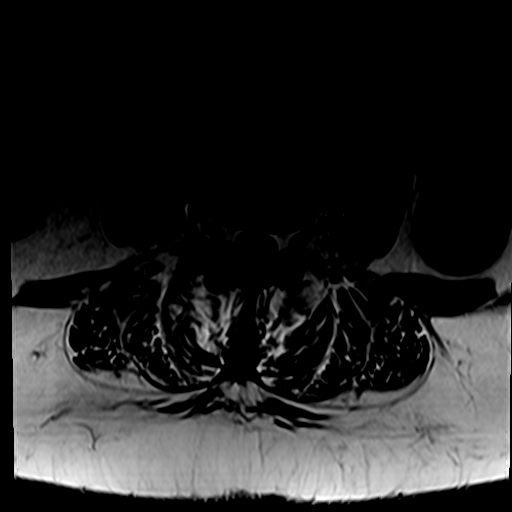
[im 12/29]
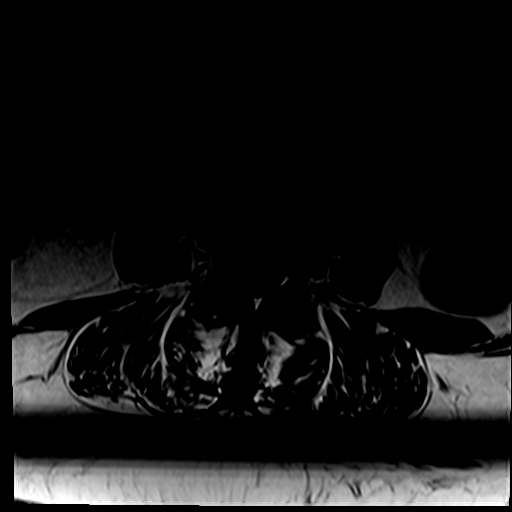
[im 15/29]
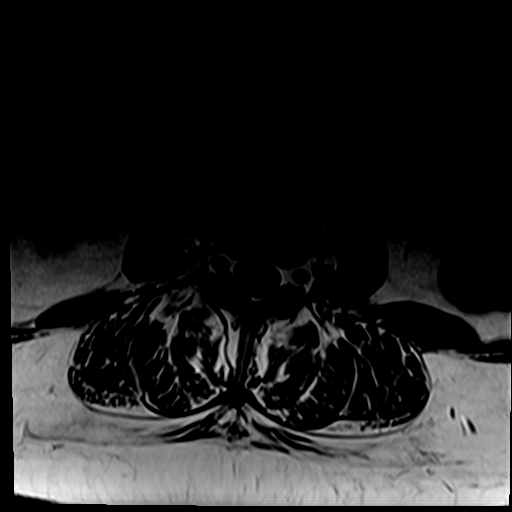
[im 17/29]
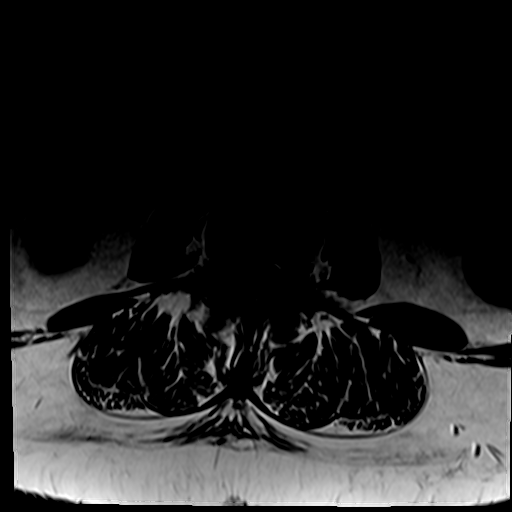
[im 19/29]
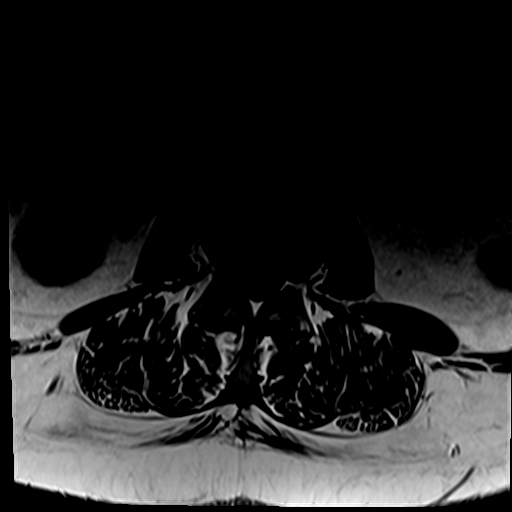
[im 24/29]
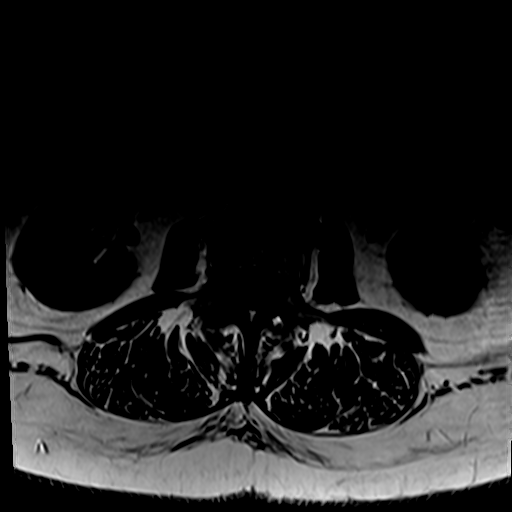
[im 29/29]
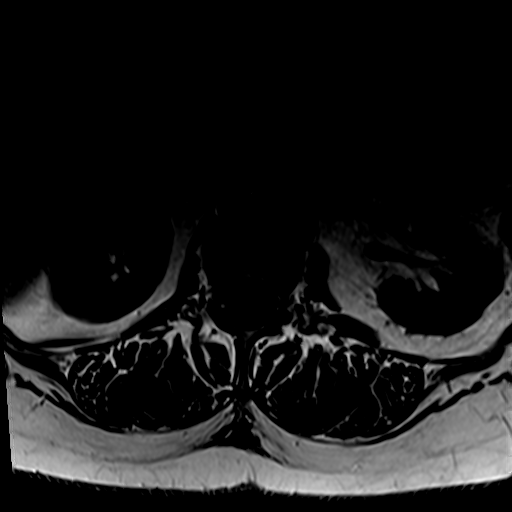

[33 of 48 positions shown; findings below may reference images not displayed]

FINDINGS: Segmentation:  Standard.

Alignment:  Levocurvature.  Anteroposterior alignment preserved.

Vertebrae: Stable vertebral body heights. No marrow edema. No
suspicious osseous lesion.

Conus medullaris and cauda equina: Conus extends to the L1 level.
Conus and cauda equina appear normal.

Paraspinal and other soft tissues: Unremarkable.

Disc levels:

L1-L2:  Trace disc bulge.  No canal or foraminal stenosis.

L2-L3: Trace disc bulge with superimposed small left foraminal
protrusion. No canal or foraminal stenosis.

L3-L4: Trace disc bulge with superimposed small left foraminal
protrusion. Mild facet arthropathy with ligamentum flavum infolding.
No canal stenosis. Minimal left subarticular recess narrowing. No
right foraminal stenosis. Minimal left foraminal stenosis.

L4-L5: Moderate facet arthropathy with ligamentum flavum infolding.
Facet synovial cyst is no longer present. No canal or foraminal
stenosis.

L5-S1:  Moderate facet arthropathy.  No canal or foraminal stenosis.
IMPRESSION: Overall mild degenerative changes as detailed above without
progression since 4370 examination. Lower lumbar facet arthropathy.

## 2022-02-03 ENCOUNTER — Telehealth: Payer: Self-pay

## 2022-02-03 MED ORDER — METRONIDAZOLE 500 MG PO TABS: 500.0000 mg | ORAL_TABLET | Freq: Two times a day (BID) | ORAL | 2 refills | Status: DC

## 2022-02-03 NOTE — Telephone Encounter (Signed)
Patient called asking for a RF of Flagyl RX.  She states she is currently on Amoxicillin but needs to take a break from medication due to yeast infections. Patient is currently taking '250mg'$  of amoxicillin 1 PO BID.  Past RX for Flagyl was '500mg'$  1 PO BID.   Okay to send in and change?

## 2022-02-03 NOTE — Telephone Encounter (Signed)
Prescription sent in. aw

## 2022-02-15 NOTE — Progress Notes (Unsigned)
PROVIDER NOTE: Information contained herein reflects review and annotations entered in association with encounter. Interpretation of such information and data should be left to medically-trained personnel. Information provided to patient can be located elsewhere in the medical record under "Patient Instructions". Document created using STT-dictation technology, any transcriptional errors that may result from process are unintentional.    Patient: Zoe Sanchez  Service Category: E/M  Provider: Gaspar Cola, MD  DOB: [birth year]  DOS: 02/16/2022  Specialty: Interventional Pain Management  MRN: [id number]  Setting: Ambulatory outpatient  PCP: Denton Lank, MD  Type: Established Patient    Referring Provider: Denton Lank, MD  Location: Office  Delivery: Face-to-face     HPI  Zoe Sanchez, a 66 y.o. year old female, is here today because of her No primary diagnosis found.. Zoe Sanchez primary complain today is No chief complaint on file. Last encounter: My last encounter with her was on 11/10/2021. Pertinent problems: Zoe Sanchez has Myofascial pain syndrome; Cervical facet syndrome (Bilateral) (R>L); Chronic neck pain (4th area of Pain) (Bilateral) (R>L); Chronic pain syndrome; Cervical spondylosis; Fibromyalgia; Cervical herniated disc (C5-6 and C6-7); Cervical foraminal stenosis (Bilateral) (C5-6); Chronic cervical radicular pain (Bilateral) (R>L) (C8 Dermatome); Chronic low back pain (1ry area of Pain) (Bilateral) (L>R) w/ sciatica (Bilateral) (L>R); Spondylosis of lumbar spine; Family history of chronic pain; Carpal tunnel syndrome (Left); Chronic shoulder pain (Bilateral); Cervical facet arthropathy; Lumbar facet arthropathy; Bimalleolar fracture of left ankle; Lumbar facet syndrome (Bilateral) (R>L); Chronic upper extremity pain (5th area of Pain) (Bilateral) (R>L); L4-5 Lumbar Facet joint Synovial cyst; Chronic lower extremity pain (2ry area of Pain) (Bilateral) (L>R); Chronic lumbar  radicular pain (Bilateral) (S1); Arthropathy of lumbar facet joint; Cervical syndrome; Lumbar spondylosis; Synovial cyst of lumbar spine; Prolapsed cervical intervertebral disc; Lumbar facet joint pain; Cervical radiculopathy; Stenosis of intervertebral foramina; Chronic hip pain (3ry area of Pain) (Bilateral); Shoulder pain; Chronic musculoskeletal pain; Neurogenic pain; Closed trimalleolar fracture of left ankle; Bilateral lower extremity edema; Chronic pain of multiple sites; Closed fracture of fifth metatarsal bone; Lumbosacral sensory radiculopathy at S1 (Bilateral); Decreased range of motion of lumbar spine; and Numbness and tingling of lower extremity (Bilateral) on their pertinent problem list. Pain Assessment: Severity of   is reported as a  /10. Location:    / . Onset:  . Quality:  . Timing:  . Modifying factor(s):  Marland Kitchen Vitals:  vitals were not taken for this visit.   Reason for encounter:  *** . ***  Pharmacotherapy Assessment  Analgesic: Hydrocodone/APAP 5/325, 1 tab PO q 4 hrs (30 mg/day of hydrocodone)(1,950 mg/day of acetaminophen) MME/day: 30 mg/day.   Monitoring: [place] PMP: PDMP reviewed during this encounter.       Pharmacotherapy: No side-effects or adverse reactions reported. Compliance: No problems identified. Effectiveness: Clinically acceptable.  No notes on file  UDS:  Summary  Date Value Ref Range Status  02/17/2021 Note  Final    Comment:    ==================================================================== [facility] (MW) ==================================================================== Test                             Result       Flag       Units  Drug Present and Declared for Prescription Verification   Hydrocodone                    >6803        EXPECTED   ng/mg creat  Hydromorphone                  477          EXPECTED   ng/mg creat   Dihydrocodeine                 1059         EXPECTED   ng/mg creat   Norhydrocodone                 2802          EXPECTED   ng/mg creat    Sources of hydrocodone include scheduled prescription medications.    Hydromorphone, dihydrocodeine and norhydrocodone are expected    metabolites of hydrocodone. Hydromorphone and dihydrocodeine are    also available as scheduled prescription medications.  Drug Present not Declared for Prescription Verification   Oxazepam                       18           UNEXPECTED ng/mg creat    Oxazepam may be administered as a scheduled prescription medication;    it is also an expected metabolite of other benzodiazepine drugs,    including diazepam, chlordiazepoxide, prazepam, clorazepate,    halazepam, and temazepam.  ==================================================================== Test                      Result    Flag   Units      Ref Range   Creatinine              147              mg/dL      >=20 ==================================================================== Declared Medications:  The flagging and interpretation on this report are based on the  following declared medications.  Unexpected results may arise from  inaccuracies in the declared medications.   **Note: The testing scope of this panel includes these medications:   Hydrocodone (Norco)   **Note: The testing scope of this panel does not include the  following reported medications:   Acetaminophen (Norco)  Albuterol  Amitriptyline (Elavil)  Amoxicillin  Cholecalciferol  Cyanocobalamin  Cyclobenzaprine (Flexeril)  Dicyclomine (Bentyl)  Estradiol  Fluticasone (Flonase)  Magnesium  Melatonin  Montelukast (Singulair)  Omeprazole  Pregabalin (Lyrica)  Propranolol  Rosuvastatin  Umeclidinium (Incruse Ellipta)  Zolpidem (Ambien) ==================================================================== For clinical consultation, please call [phone]. ====================================================================      ROS  Constitutional: Denies any fever or  chills Gastrointestinal: No reported hemesis, hematochezia, vomiting, or acute GI distress Musculoskeletal: Denies any acute onset joint swelling, redness, loss of ROM, or weakness Neurological: No reported episodes of acute onset apraxia, aphasia, dysarthria, agnosia, amnesia, paralysis, loss of coordination, or loss of consciousness  Medication Review  HYDROcodone-acetaminophen, Melatonin, Ruxolitinib Phosphate, Vitamin D3, albuterol, amitriptyline, amoxicillin, cyanocobalamin, cyclobenzaprine, dicyclomine, estradiol, fluticasone, magnesium gluconate, metroNIDAZOLE, montelukast, omeprazole, pregabalin, propranolol, rosuvastatin, salmeterol, umeclidinium bromide, and zolpidem  History Review  Allergy: Zoe Sanchez is allergic to prednisone. Drug: Zoe Sanchez  reports no history of drug use. Alcohol:  reports no history of alcohol use. Tobacco:  reports that she has never smoked. She has never used smokeless tobacco. Social: Zoe Sanchez  reports that she has never smoked. She has never used smokeless tobacco. She reports that she does not drink alcohol and does not use drugs. Medical:  has a past medical history of Abnormal heart rhythm, Abnormal mammogram (02/25/2014), Actinic keratosis, Anemia, Anxiety,  Asthma, Awareness of heartbeats (06/29/2015), Basal cell carcinoma (04/24/2008), Basal cell carcinoma (04/09/2009), Basal cell carcinoma (BCC) (12/23/2014), Breath shortness (06/29/2015), CAD (coronary artery disease), Cancer (HCC), Cervical radicular pain (Location of Secondary source of pain) (Bilateral) (R>L) (C8 Dermatome) (06/29/2015), Chronic pain, COPD (chronic obstructive pulmonary disease) ([place]), DDD (degenerative disc disease), cervical, Degenerative disk disease, Depression, Dysrhythmia, Endometriosis, Family history of chronic pain (06/29/2015), Fibromyalgia, Fibromyalgia, GERD (gastroesophageal reflux disease), Heart murmur, Hiatal hernia, History of attempted suicide (06/29/2015), History  of hiatal hernia (06/29/2015), History of psychiatric disorder (06/29/2015), History of suicidal ideation (06/29/2015), Hyperlipidemia, Insomnia, Insomnia, Irritable bowel syndrome, 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 Surgical: Zoe Sanchez  has a past surgical history that includes Abdominal hysterectomy (1986); Tonsillectomy (1957); Skin lesion excision; Oophorectomy (1986); Breast surgery; ORIF ankle fracture (Left, 03/14/2018); ORIF ankle fracture (Left, 07/26/2018); Ankle surgery; Colonoscopy with propofol (N/A, 06/05/2019); Breast biopsy (Right, 2016); Fracture surgery; and Colonoscopy with propofol (N/A, 03/05/2020). Family: family history includes Breast cancer in her maternal aunt; Heart disease in her father; Hypertension in her father and mother.  Laboratory Chemistry Profile   Renal Lab Results  Component Value Date   BUN 21 04/26/2019   CREATININE 0.84 04/26/2019   GFRAA >60 04/26/2019   GFRNONAA >60 04/26/2019    Hepatic Lab Results  Component Value Date   AST 35 04/26/2019   ALT 22 04/26/2019   ALBUMIN 4.1 04/26/2019   ALKPHOS 71 04/26/2019    Electrolytes Lab Results  Component Value Date   NA 135 04/26/2019   K 4.5 04/26/2019   CL 103 04/26/2019   CALCIUM 9.4 04/26/2019   MG 1.8 04/26/2019    Bone Lab Results  Component Value Date   VD25OH 40.8 04/26/2019   25OHVITD1 49 03/09/2016   25OHVITD2 <1.0 03/09/2016   25OHVITD3 48 03/09/2016    Inflammation (CRP: Acute Phase) (ESR: Chronic Phase) Lab Results  Component Value Date   CRP 2.2 (H) 04/26/2019   ESRSEDRATE 39 (H) 04/26/2019         Note: Above Lab results reviewed.  Recent Imaging Review  MR LUMBAR SPINE WO CONTRAST CLINICAL DATA:  Osteoarthritis, lumbosacral Low back pain, > 6 wks Lumbar radiculopathy, > 6 wks  EXAM: MRI LUMBAR SPINE WITHOUT  CONTRAST  TECHNIQUE: Multiplanar, multisequence MR imaging of the lumbar spine was performed. No intravenous contrast was administered.  COMPARISON:  May 2018  FINDINGS: Segmentation:  Standard.  Alignment:  Levocurvature.  Anteroposterior alignment preserved.  Vertebrae: Stable vertebral body heights. No marrow edema. No suspicious osseous lesion.  Conus medullaris and cauda equina: Conus extends to the L1 level. Conus and cauda equina appear normal.  Paraspinal and other soft tissues: Unremarkable.  Disc levels:  L1-L2:  Trace disc bulge.  No canal or foraminal stenosis.  L2-L3: Trace disc bulge with superimposed small left foraminal protrusion. No canal or foraminal stenosis.  L3-L4: Trace disc bulge with superimposed small left foraminal protrusion. Mild facet arthropathy with ligamentum flavum infolding. No canal stenosis. Minimal left subarticular recess narrowing. No right foraminal stenosis. Minimal left foraminal stenosis.  L4-L5: Moderate facet arthropathy with ligamentum flavum infolding. Facet synovial cyst is no longer present. No canal or foraminal stenosis.  L5-S1:  Moderate facet arthropathy.  No canal or foraminal stenosis.  IMPRESSION: Overall mild degenerative changes as detailed above without progression since 2018 examination. Lower lumbar facet arthropathy.  Electronically Signed   By: Malachi Carl  Patel M.D.   On: 06/05/2021 15:05 Note: Reviewed        Physical Exam  General appearance: Well nourished, well developed, and well hydrated. In no apparent acute distress Mental status: Alert, oriented x 3 (person, place, & time)       Respiratory: No evidence of acute respiratory distress Eyes: PERLA Vitals: There were no vitals taken for this visit. BMI: Estimated body mass index is 28.32 kg/m as calculated from the following:   Height as of 11/10/21: _0  (1.626 m).   Weight as of 11/10/21: 165 lb (74.8 kg). Ideal: Patient weight not  recorded  Assessment   Diagnosis Status  No diagnosis found. Controlled Controlled Controlled   Updated Problems: No problems updated.  Plan of Care  Problem-specific:  No problem-specific Assessment & Plan notes found for this encounter.  Zoe Sanchez has a current medication list which includes the following long-term medication(s): albuterol, amitriptyline, vitamin d3, cyanocobalamin, cyclobenzaprine, dicyclomine, estradiol, fluticasone, hydrocodone-acetaminophen, hydrocodone-acetaminophen, hydrocodone-acetaminophen, magnesium gluconate, melatonin, montelukast, omeprazole, pregabalin, propranolol, rosuvastatin, and salmeterol.  Pharmacotherapy (Medications Ordered): No orders of the defined types were placed in this encounter.  Orders:  No orders of the defined types were placed in this encounter.  Follow-up plan:   No follow-ups on file.     Interventional Therapies  Risk  Complexity Considerations:   Estimated body mass index is 29.23 kg/m as calculated from the following:   Height as of this encounter: _1  (1.6 m).   Weight as of this encounter: 165 lb (74.8 kg). WNL   Planned  Pending:   none   Under consideration:   Diagnostic bilateral cervical facet MBB  Diagnostic right CESI  Diagnostic bilateral IA shoulder joint injection  Diagnostic bilateral suprascapular NB  Possible bilateral lumbar facet RFA  Diagnostic left carpal tunnel injection    Completed:   Diagnostic bilateral lumbar facet MBB x2 (w/o steroids) (04/20/2017) (100/60/60/<25)    Therapeutic  Palliative (PRN) options:   none     Recent Visits No visits were found meeting these conditions. Showing recent visits within past 90 days and meeting all other requirements Future Appointments Date Type Provider Dept  02/16/22 Appointment Milinda Pointer, MD [facility]  Showing future appointments within next 90 days and meeting all other requirements  I discussed the  assessment and treatment plan with the patient. The patient was provided an opportunity to ask questions and all were answered. The patient agreed with the plan and demonstrated an understanding of the instructions.  Patient advised to call back or seek an in-person evaluation if the symptoms or condition worsens.  Duration of encounter: *** minutes.  Note by: Gaspar Cola, MD Date: 02/16/2022; Time: 4:48 PM

## 2022-02-16 ENCOUNTER — Ambulatory Visit: Payer: Medicare Other | Attending: Pain Medicine | Admitting: Pain Medicine

## 2022-02-16 ENCOUNTER — Encounter: Payer: Self-pay | Admitting: Pain Medicine

## 2022-02-16 VITALS — BP 167/80 | HR 72 | Temp 97.9°F | Ht 63.0 in | Wt 175.0 lb

## 2022-02-16 DIAGNOSIS — M5459 Other low back pain: Secondary | ICD-10-CM

## 2022-02-16 DIAGNOSIS — M797 Fibromyalgia: Secondary | ICD-10-CM

## 2022-02-16 DIAGNOSIS — G894 Chronic pain syndrome: Secondary | ICD-10-CM | POA: Diagnosis present

## 2022-02-16 DIAGNOSIS — G8929 Other chronic pain: Secondary | ICD-10-CM | POA: Insufficient documentation

## 2022-02-16 DIAGNOSIS — M25552 Pain in left hip: Secondary | ICD-10-CM | POA: Diagnosis present

## 2022-02-16 DIAGNOSIS — M542 Cervicalgia: Secondary | ICD-10-CM | POA: Insufficient documentation

## 2022-02-16 DIAGNOSIS — M7138 Other bursal cyst, other site: Secondary | ICD-10-CM | POA: Diagnosis present

## 2022-02-16 DIAGNOSIS — M47816 Spondylosis without myelopathy or radiculopathy, lumbar region: Secondary | ICD-10-CM

## 2022-02-16 DIAGNOSIS — Z79891 Long term (current) use of opiate analgesic: Secondary | ICD-10-CM | POA: Diagnosis present

## 2022-02-16 DIAGNOSIS — M79602 Pain in left arm: Secondary | ICD-10-CM | POA: Insufficient documentation

## 2022-02-16 DIAGNOSIS — Z79899 Other long term (current) drug therapy: Secondary | ICD-10-CM

## 2022-02-16 DIAGNOSIS — M79601 Pain in right arm: Secondary | ICD-10-CM | POA: Diagnosis present

## 2022-02-16 DIAGNOSIS — M25551 Pain in right hip: Secondary | ICD-10-CM | POA: Insufficient documentation

## 2022-02-16 DIAGNOSIS — M545 Low back pain, unspecified: Secondary | ICD-10-CM

## 2022-02-16 DIAGNOSIS — R937 Abnormal findings on diagnostic imaging of other parts of musculoskeletal system: Secondary | ICD-10-CM

## 2022-02-16 DIAGNOSIS — M47812 Spondylosis without myelopathy or radiculopathy, cervical region: Secondary | ICD-10-CM | POA: Diagnosis present

## 2022-02-16 MED ORDER — HYDROCODONE-ACETAMINOPHEN 5-325 MG PO TABS: 1.0000 | ORAL_TABLET | ORAL | 0 refills | Status: DC | PRN

## 2022-02-16 NOTE — Progress Notes (Signed)
Nursing Pain Medication Assessment:  Safety precautions to be maintained throughout the outpatient stay will include: orient to surroundings, keep bed in low position, maintain call bell within reach at all times, provide assistance with transfer out of bed and ambulation.  Medication Inspection Compliance: Pill count conducted under aseptic conditions, in front of the patient. Neither the pills nor the bottle was removed from the patient's sight at any time. Once count was completed pills were immediately returned to the patient in their original bottle.  Medication: Hydrocodone/APAP Pill/Patch Count:  24 of 180 pills remain Pill/Patch Appearance: Markings consistent with prescribed medication Bottle Appearance: Standard pharmacy container. Clearly labeled. Filled Date:  5  / 18 / 2023 Last Medication intake:  TodaySafety precautions to be maintained throughout the outpatient stay will include: orient to surroundings, keep bed in low position, maintain call bell within reach at all times, provide assistance with transfer out of bed and ambulation.

## 2022-02-16 NOTE — Patient Instructions (Signed)
____________________________________________________________________________________________  Medication Rules  Purpose: To inform patients, and their family members, of our rules and regulations.  Applies to: All patients receiving prescriptions (written or electronic).  Pharmacy of record: Pharmacy where electronic prescriptions will be sent. If written prescriptions are taken to a different pharmacy, please inform the nursing staff. The pharmacy listed in the electronic medical record should be the one where you would like electronic prescriptions to be sent.  Electronic prescriptions: In compliance with the [place] [employer]reng[employer] Opioid Misuse Prevention (STOP) Act of 2017 (Session Law 2017-74/H243), effective September 12, 2018, all controlled substances must be electronically prescribed. Calling prescriptions to the pharmacy will cease to exist.  Prescription refills: Only during scheduled appointments. Applies to all prescriptions.  NOTE: The following applies primarily to controlled substances (Opioid* Pain Medications).   Type of encounter (visit): For patients receiving controlled substances, face-to-face visits are required. (Not an option or up to the patient.)  Patient's responsibilities: Pain Pills: Bring all pain pills to every appointment (except for procedure appointments). Pill Bottles: Bring pills in original pharmacy bottle. Always bring the newest bottle. Bring bottle, even if empty. Medication refills: You are responsible for knowing and keeping track of what medications you take and those you need refilled. The day before your appointment: write a list of all prescriptions that need to be refilled. The day of the appointment: give the list to the admitting nurse. Prescriptions will be written only during appointments. No prescriptions will be written on procedure days. If you forget a medication: it will not be "Called in", "Faxed", or "electronically sent". You will  need to get another appointment to get these prescribed. No early refills. Do not call asking to have your prescription filled early. Prescription Accuracy: You are responsible for carefully inspecting your prescriptions before leaving our office. Have the discharge nurse carefully go over each prescription with you, before taking them home. Make sure that your name is accurately spelled, that your address is correct. Check the name and dose of your medication to make sure it is accurate. Check the number of pills, and the written instructions to make sure they are clear and accurate. Make sure that you are given enough medication to last until your next medication refill appointment. Taking Medication: Take medication as prescribed. When it comes to controlled substances, taking less pills or less frequently than prescribed is permitted and encouraged. Never take more pills than instructed. Never take medication more frequently than prescribed.  Inform other Doctors: Always inform, all of your healthcare providers, of all the medications you take. Pain Medication from other Providers: You are not allowed to accept any additional pain medication from any other Doctor or Healthcare provider. There are two exceptions to this rule. (see below) In the event that you require additional pain medication, you are responsible for notifying us, as stated below. Cough Medicine: Often these contain an opioid, such as codeine or hydrocodone. Never accept or take cough medicine containing these opioids if you are already taking an opioid* medication. The combination may cause respiratory failure and death. Medication Agreement: You are responsible for carefully reading and following our Medication Agreement. This must be signed before receiving any prescriptions from our practice. Safely store a copy of your signed Agreement. Violations to the Agreement will result in no further prescriptions. (Additional copies of our  Medication Agreement are available upon request.) Laws, Rules, & Regulations: All patients are expected to follow all Federal and State Laws, Statutes, Rules, & Regulations. Ignorance of   the Laws does not constitute a valid excuse.  Illegal drugs and Controlled Substances: The use of illegal substances (including, but not limited to marijuana and its derivatives) and/or the illegal use of any controlled substances is strictly prohibited. Violation of this rule may result in the immediate and permanent discontinuation of any and all prescriptions being written by our practice. The use of any illegal substances is prohibited. Adopted CDC guidelines & recommendations: Target dosing levels will be at or below 60 MME/day. Use of benzodiazepines** is not recommended.  Exceptions: There are only two exceptions to the rule of not receiving pain medications from other Healthcare Providers. Exception #1 (Emergencies): In the event of an emergency (i.e.: accident requiring emergency care), you are allowed to receive additional pain medication. However, you are responsible for: As soon as you are able, call our office [phone], at any time of the day or night, and leave a message stating your name, the date and nature of the emergency, and the name and dose of the medication prescribed. In the event that your call is answered by a member of our staff, make sure to document and save the date, time, and the name of the person that took your information.  Exception #2 (Planned Surgery): In the event that you are scheduled by another doctor or dentist to have any type of surgery or procedure, you are allowed (for a period no longer than 30 days), to receive additional pain medication, for the acute post-op pain. However, in this case, you are responsible for picking up a copy of our "Post-op Pain Management for Surgeons" handout, and giving it to your surgeon or dentist. This document is available at our office, and  does not require an appointment to obtain it. Simply go to our office during business hours (Monday-Thursday from 8:00 AM to 4:00 PM) (Friday 8:00 AM to 12:00 Noon) or if you have a scheduled appointment with us, prior to your surgery, and ask for it by name. In addition, you are responsible for: calling our office [phone], at any time of the day or night, and leaving a message stating your name, name of your surgeon, type of surgery, and date of procedure or surgery. Failure to comply with your responsibilities may result in termination of therapy involving the controlled substances. Medication Agreement Violation. Following the above rules, including your responsibilities will help you in avoiding a Medication Agreement Violation ("Breaking your Pain Medication Contract").  *Opioid medications include: morphine, codeine, oxycodone, oxymorphone, hydrocodone, hydromorphone, meperidine, tramadol, tapentadol, buprenorphine, fentanyl, methadone. **Benzodiazepine medications include: diazepam (Valium), alprazolam (Xanax), clonazepam (Klonopine), lorazepam (Ativan), clorazepate (Tranxene), chlordiazepoxide (Librium), estazolam (Prosom), oxazepam (Serax), temazepam (Restoril), triazolam (Halcion) (Last updated: 06/09/2021) ____________________________________________________________________________________________  ____________________________________________________________________________________________  Medication Recommendations and Reminders  Applies to: All patients receiving prescriptions (written and/or electronic).  Medication Rules & Regulations: These rules and regulations exist for your safety and that of others. They are not flexible and neither are we. Dismissing or ignoring them will be considered "non-compliance" with medication therapy, resulting in complete and irreversible termination of such therapy. (See document titled "Medication Rules" for more details.) In all conscience,  because of safety reasons, we cannot continue providing a therapy where the patient does not follow instructions.  Pharmacy of record:  Definition: This is the pharmacy where your electronic prescriptions will be sent.  We do not endorse any particular pharmacy, however, we have experienced problems with [facility] not securing enough medication supply for the community. We do not restrict you   in your choice of pharmacy. However, once we write for your prescriptions, we will NOT be re-sending more prescriptions to fix restricted supply problems created by your pharmacy, or your insurance.  The pharmacy listed in the electronic medical record should be the one where you want electronic prescriptions to be sent. If you choose to change pharmacy, simply notify our nursing staff.  Recommendations: Keep all of your pain medications in a safe place, under lock and key, even if you live alone. We will NOT replace lost, stolen, or damaged medication. After you fill your prescription, take 1 week's worth of pills and put them away in a safe place. You should keep a separate, properly labeled bottle for this purpose. The remainder should be kept in the original bottle. Use this as your primary supply, until it runs out. Once it's gone, then you know that you have 1 week's worth of medicine, and it is time to come in for a prescription refill. If you do this correctly, it is unlikely that you will ever run out of medicine. To make sure that the above recommendation works, it is very important that you make sure your medication refill appointments are scheduled at least 1 week before you run out of medicine. To do this in an effective manner, make sure that you do not leave the office without scheduling your next medication management appointment. Always ask the nursing staff to show you in your prescription , when your medication will be running out. Then arrange for the receptionist to get you a return appointment,  at least 7 days before you run out of medicine. Do not wait until you have 1 or 2 pills left, to come in. This is very poor planning and does not take into consideration that we may need to cancel appointments due to bad weather, sickness, or emergencies affecting our staff. DO NOT ACCEPT A "Partial Fill": If for any reason your pharmacy does not have enough pills/tablets to completely fill or refill your prescription, do not allow for a "partial fill". The law allows the pharmacy to complete that prescription within 72 hours, without requiring a new prescription. If they do not fill the rest of your prescription within those 72 hours, you will need a separate prescription to fill the remaining amount, which we will NOT provide. If the reason for the partial fill is your insurance, you will need to talk to the pharmacist about payment alternatives for the remaining tablets, but again, DO NOT ACCEPT A PARTIAL FILL, unless you can trust your pharmacist to obtain the remainder of the pills within 72 hours.  Prescription refills and/or changes in medication(s):  Prescription refills, and/or changes in dose or medication, will be conducted only during scheduled medication management appointments. (Applies to both, written and electronic prescriptions.) No refills on procedure days. No medication will be changed or started on procedure days. No changes, adjustments, and/or refills will be conducted on a procedure day. Doing so will interfere with the diagnostic portion of the procedure. No phone refills. No medications will be "called into the pharmacy". No Fax refills. No weekend refills. No Holliday refills. No after hours refills.  Remember:  Business hours are:  Monday to Thursday 8:00 AM to 4:00 PM Provider's Schedule: Tonye Tancredi, MD - Appointments are:  Medication management: Monday and Wednesday 8:00 AM to 4:00 PM Procedure day: Tuesday and Thursday 7:30 AM to 4:00 PM Bilal Lateef, MD -  Appointments are:  Medication management: Tuesday and Thursday 8:00   AM to 4:00 PM Procedure day: Monday and Wednesday 7:30 AM to 4:00 PM (Last update: 04/01/2020) ____________________________________________________________________________________________  ____________________________________________________________________________________________  CBD (cannabidiol) & Delta-8 (Delta-8 tetrahydrocannabinol) WARNING  Intro: Cannabidiol (CBD) and tetrahydrocannabinol (THC), are two natural compounds found in plants of the Cannabis genus. They can both be extracted from hemp or cannabis. Hemp and cannabis come from the Cannabis sativa plant. Both compounds interact with your body's endocannabinoid system, but they have very different effects. CBD does not produce the high sensation associated with cannabis. Delta-8 tetrahydrocannabinol, also known as delta-8 THC, is a psychoactive substance found in the Cannabis sativa plant, of which marijuana and hemp are two varieties. THC is responsible for the high associated with the illicit use of marijuana.  Applicable to: All individuals currently taking or considering taking CBD (cannabidiol) and, more important, all patients taking opioid analgesic controlled substances (pain medication). (Example: oxycodone; oxymorphone; hydrocodone; hydromorphone; morphine; methadone; tramadol; tapentadol; fentanyl; buprenorphine; butorphanol; dextromethorphan; meperidine; codeine; etc.)  Legal status: CBD remains a Schedule I drug prohibited for any use. CBD is illegal with one exception. In the [place], CBD has a limited Food and Drug Administration (FDA) approval for the treatment of two specific types of epilepsy disorders. Only one CBD product has been approved by the FDA for this purpose: "Epidiolex". FDA is aware that some companies are marketing products containing cannabis and cannabis-derived compounds in ways that violate the Federal Food, Drug and Cosmetic Act  (FD&C Act) and that may put the health and safety of consumers at risk. The FDA, a Federal agency, has not enforced the CBD status since 2018. UPDATE: (10/29/2021) The Drug Enforcement Agency (DEA) issued a letter stating that "delta" cannabinoids, including Delta-8-THCO and Delta-9-THCO, synthetically derived from hemp do not qualify as hemp and will be viewed as Schedule I drugs. (Schedule I drugs, substances, or chemicals are defined as drugs with no currently accepted medical use and a high potential for abuse. Some examples of Schedule I drugs are: heroin, lysergic acid diethylamide (LSD), marijuana (cannabis), 3,4-methylenedioxymethamphetamine (ecstasy), methaqualone, and peyote.) (https://www.dea.gov)  Legality: Some manufacturers ship CBD products nationally, which is illegal. Often such products are sold online and are therefore available throughout the country. CBD is openly sold in head shops and health food stores in some states where such sales have not been explicitly legalized. Selling unapproved products with unsubstantiated therapeutic claims is not only a violation of the law, but also can put patients at risk, as these products have not been proven to be safe or effective. Federal illegality makes it difficult to conduct research on CBD.  Reference: "FDA Regulation of Cannabis and Cannabis-Derived Products, Including Cannabidiol (CBD)" - https://www.fda.gov/news-events/public-health-focus/fda-regulation-cannabis-and-cannabis-derived-products-including-cannabidiol-cbd  Warning: CBD is not FDA approved and has not undergo the same manufacturing controls as prescription drugs.  This means that the purity and safety of available CBD may be questionable. Most of the time, despite manufacturer's claims, it is contaminated with THC (delta-9-tetrahydrocannabinol - the chemical in marijuana responsible for the "HIGH").  When this is the case, the THC contaminant will trigger a positive urine drug  screen (UDS) test for Marijuana (carboxy-THC). Because a positive UDS for any illicit substance is a violation of our medication agreement, your opioid analgesics (pain medicine) may be permanently discontinued. The FDA recently put out a warning about 5 things that everyone should be aware of regarding Delta-8 THC: Delta-8 THC products have not been evaluated or approved by the FDA for safe use and may be marketed in ways that put the   public health at risk. The FDA has received adverse event reports involving delta-8 THC-containing products. Delta-8 THC has psychoactive and intoxicating effects. Delta-8 THC manufacturing often involve use of potentially harmful chemicals to create the concentrations of delta-8 THC claimed in the marketplace. The final delta-8 THC product may have potentially harmful by-products (contaminants) due to the chemicals used in the process. Manufacturing of delta-8 THC products may occur in uncontrolled or unsanitary settings, which may lead to the presence of unsafe contaminants or other potentially harmful substances. Delta-8 THC products should be kept out of the reach of children and pets.  MORE ABOUT CBD  General Information: CBD was discovered in 1940 and it is a derivative of the cannabis sativa genus plants (Marijuana and Hemp). It is one of the 113 identified substances found in Marijuana. It accounts for up to 40% of the plant's extract. As of 2018, preliminary clinical studies on CBD included research for the treatment of anxiety, movement disorders, and pain. CBD is available and consumed in multiple forms, including inhalation of smoke or vapor, as an aerosol spray, and by mouth. It may be supplied as an oil containing CBD, capsules, dried cannabis, or as a liquid solution. CBD is thought not to be as psychoactive as THC (delta-9-tetrahydrocannabinol - the chemical in marijuana responsible for the "HIGH"). Studies suggest that CBD may interact with different  biological target receptors in the body, including cannabinoid and other neurotransmitter receptors. As of 2018 the mechanism of action for its biological effects has not been determined.  Side-effects  Adverse reactions: Dry mouth, diarrhea, decreased appetite, fatigue, drowsiness, malaise, weakness, sleep disturbances, and others.  Drug interactions: CBC may interact with other medications such as blood-thinners. Because CBD causes drowsiness on its own, it also increases the drowsiness caused by other medications, including antihistamines (such as Benadryl), benzodiazepines (Xanax, Ativan, Valium), antipsychotics, antidepressants and opioids, as well as alcohol and supplements such as kava, melatonin and St. John's Wort. Be cautious with the following combinations:   Brivaracetam (Briviact) Brivaracetam is changed and broken down by the body. CBD might decrease how quickly the body breaks down brivaracetam. This might increase levels of brivaracetam in the body.  Caffeine Caffeine is changed and broken down by the body. CBD might decrease how quickly the body breaks down caffeine. This might increase levels of caffeine in the body.  Carbamazepine (Tegretol) Carbamazepine is changed and broken down by the body. CBD might decrease how quickly the body breaks down carbamazepine. This might increase levels of carbamazepine in the body and increase its side effects.  Citalopram (Celexa) Citalopram is changed and broken down by the body. CBD might decrease how quickly the body breaks down citalopram. This might increase levels of citalopram in the body and increase its side effects.  Clobazam (Onfi) Clobazam is changed and broken down by the liver. CBD might decrease how quickly the liver breaks down clobazam. This might increase the effects and side effects of clobazam.  Eslicarbazepine (Aptiom) Eslicarbazepine is changed and broken down by the body. CBD might decrease how quickly the body  breaks down eslicarbazepine. This might increase levels of eslicarbazepine in the body by a small amount.  Everolimus (Zostress) Everolimus is changed and broken down by the body. CBD might decrease how quickly the body breaks down everolimus. This might increase levels of everolimus in the body.  Lithium Taking higher doses of CBD might increase levels of lithium. This can increase the risk of lithium toxicity.  Medications changed by the liver (  Cytochrome P450 1A1 (CYP1A1) substrates) Some medications are changed and broken down by the liver. CBD might change how quickly the liver breaks down these medications. This could change the effects and side effects of these medications.  Medications changed by the liver (Cytochrome P450 1A2 (CYP1A2) substrates) Some medications are changed and broken down by the liver. CBD might change how quickly the liver breaks down these medications. This could change the effects and side effects of these medications.  Medications changed by the liver (Cytochrome P450 1B1 (CYP1B1) substrates) Some medications are changed and broken down by the liver. CBD might change how quickly the liver breaks down these medications. This could change the effects and side effects of these medications.  Medications changed by the liver (Cytochrome P450 2A6 (CYP2A6) substrates) Some medications are changed and broken down by the liver. CBD might change how quickly the liver breaks down these medications. This could change the effects and side effects of these medications.  Medications changed by the liver (Cytochrome P450 2B6 (CYP2B6) substrates) Some medications are changed and broken down by the liver. CBD might change how quickly the liver breaks down these medications. This could change the effects and side effects of these medications.  Medications changed by the liver (Cytochrome P450 2C19 (CYP2C19) substrates) Some medications are changed and broken down by the liver.  CBD might change how quickly the liver breaks down these medications. This could change the effects and side effects of these medications.  Medications changed by the liver (Cytochrome P450 2C8 (CYP2C8) substrates) Some medications are changed and broken down by the liver. CBD might change how quickly the liver breaks down these medications. This could change the effects and side effects of these medications.  Medications changed by the liver (Cytochrome P450 2C9 (CYP2C9) substrates) Some medications are changed and broken down by the liver. CBD might change how quickly the liver breaks down these medications. This could change the effects and side effects of these medications.  Medications changed by the liver (Cytochrome P450 2D6 (CYP2D6) substrates) Some medications are changed and broken down by the liver. CBD might change how quickly the liver breaks down these medications. This could change the effects and side effects of these medications.  Medications changed by the liver (Cytochrome P450 2E1 (CYP2E1) substrates) Some medications are changed and broken down by the liver. CBD might change how quickly the liver breaks down these medications. This could change the effects and side effects of these medications.  Medications changed by the liver (Cytochrome P450 3A4 (CYP3A4) substrates) Some medications are changed and broken down by the liver. CBD might change how quickly the liver breaks down these medications. This could change the effects and side effects of these medications.  Medications changed by the liver (Glucuronidated drugs) Some medications are changed and broken down by the liver. CBD might change how quickly the liver breaks down these medications. This could change the effects and side effects of these medications.  Medications that decrease the breakdown of other medications by the liver (Cytochrome P450 2C19 (CYP2C19) inhibitors) CBD is changed and broken down by the liver.  Some drugs decrease how quickly the liver changes and breaks down CBD. This could change the effects and side effects of CBD.  Medications that decrease the breakdown of other medications in the liver (Cytochrome P450 3A4 (CYP3A4) inhibitors) CBD is changed and broken down by the liver. Some drugs decrease how quickly the liver changes and breaks down CBD. This could change the effects   and side effects of CBD.  Medications that increase breakdown of other medications by the liver (Cytochrome P450 3A4 (CYP3A4) inducers) CBD is changed and broken down by the liver. Some drugs increase how quickly the liver changes and breaks down CBD. This could change the effects and side effects of CBD.  Medications that increase the breakdown of other medications by the liver (Cytochrome P450 2C19 (CYP2C19) inducers) CBD is changed and broken down by the liver. Some drugs increase how quickly the liver changes and breaks down CBD. This could change the effects and side effects of CBD.  Methadone (Dolophine) Methadone is broken down by the liver. CBD might decrease how quickly the liver breaks down methadone. Taking cannabidiol along with methadone might increase the effects and side effects of methadone.  Rufinamide (Banzel) Rufinamide is changed and broken down by the body. CBD might decrease how quickly the body breaks down rufinamide. This might increase levels of rufinamide in the body by a small amount.  Sedative medications (CNS depressants) CBD might cause sleepiness and slowed breathing. Some medications, called sedatives, can also cause sleepiness and slowed breathing. Taking CBD with sedative medications might cause breathing problems and/or too much sleepiness.  Sirolimus (Rapamune) Sirolimus is changed and broken down by the body. CBD might decrease how quickly the body breaks down sirolimus. This might increase levels of sirolimus in the body.  Stiripentol (Diacomit) Stiripentol is changed and  broken down by the body. CBD might decrease how quickly the body breaks down stiripentol. This might increase levels of stiripentol in the body and increase its side effects.  Tacrolimus (Prograf) Tacrolimus is changed and broken down by the body. CBD might decrease how quickly the body breaks down tacrolimus. This might increase levels of tacrolimus in the body.  Tamoxifen (Soltamox) Tamoxifen is changed and broken down by the body. CBD might affect how quickly the body breaks down tamoxifen. This might affect levels of tamoxifen in the body.  Topiramate (Topamax) Topiramate is changed and broken down by the body. CBD might decrease how quickly the body breaks down topiramate. This might increase levels of topiramate in the body by a small amount.  Valproate Valproic acid can cause liver injury. Taking cannabidiol with valproic acid might increase the chance of liver injury. CBD and/or valproic acid might need to be stopped, or the dose might need to be reduced.  Warfarin (Coumadin) CBD might increase levels of warfarin, which can increase the risk for bleeding. CBD and/or warfarin might need to be stopped, or the dose might need to be reduced.  Zonisamide Zonisamide is changed and broken down by the body. CBD might decrease how quickly the body breaks down zonisamide. This might increase levels of zonisamide in the body by a small amount. (Last update: 11/10/2021) ____________________________________________________________________________________________  ____________________________________________________________________________________________  Drug Holidays (Slow)  What is a "Drug Holiday"? Drug Holiday: is the name given to the period of time during which a patient stops taking a medication(s) for the purpose of eliminating tolerance to the drug.  Benefits Improved effectiveness of opioids. Decreased opioid dose needed to achieve benefits. Improved pain with lesser  dose.  What is tolerance? Tolerance: is the progressive decreased in effectiveness of a drug due to its repetitive use. With repetitive use, the body gets use to the medication and as a consequence, it loses its effectiveness. This is a common problem seen with opioid pain medications. As a result, a larger dose of the drug is needed to achieve the same effect that   used to be obtained with a smaller dose.  How long should a "Drug Holiday" last? You should stay off of the pain medicine for at least 14 consecutive days. (2 weeks)  Should I stop the medicine "cold turkey"? No. You should always coordinate with your Pain Specialist so that he/she can provide you with the correct medication dose to make the transition as smoothly as possible.  How do I stop the medicine? Slowly. You will be instructed to decrease the daily amount of pills that you take by one (1) pill every seven (7) days. This is called a "slow downward taper" of your dose. For example: if you normally take four (4) pills per day, you will be asked to drop this dose to three (3) pills per day for seven (7) days, then to two (2) pills per day for seven (7) days, then to one (1) per day for seven (7) days, and at the end of those last seven (7) days, this is when the "Drug Holiday" would start.   Will I have withdrawals? By doing a "slow downward taper" like this one, it is unlikely that you will experience any significant withdrawal symptoms. Typically, what triggers withdrawals is the sudden stop of a high dose opioid therapy. Withdrawals can usually be avoided by slowly decreasing the dose over a prolonged period of time. If you do not follow these instructions and decide to stop your medication abruptly, withdrawals may be possible.  What are withdrawals? Withdrawals: refers to the wide range of symptoms that occur after stopping or dramatically reducing opiate drugs after heavy and prolonged use. Withdrawal symptoms do not occur to  patients that use low dose opioids, or those who take the medication sporadically. Contrary to benzodiazepine (example: Valium, Xanax, etc.) or alcohol withdrawals ("Delirium Tremens"), opioid withdrawals are not lethal. Withdrawals are the physical manifestation of the body getting rid of the excess receptors.  Expected Symptoms Early symptoms of withdrawal may include: Agitation Anxiety Muscle aches Increased tearing Insomnia Runny nose Sweating Yawning  Late symptoms of withdrawal may include: Abdominal cramping Diarrhea Dilated pupils Goose bumps Nausea Vomiting  Will I experience withdrawals? Due to the slow nature of the taper, it is very unlikely that you will experience any.  What is a slow taper? Taper: refers to the gradual decrease in dose.  (Last update: 04/01/2020) ____________________________________________________________________________________________    

## 2022-02-23 LAB — TOXASSURE SELECT 13 (MW), URINE

## 2022-04-18 ENCOUNTER — Other Ambulatory Visit: Payer: Self-pay | Admitting: Dermatology

## 2022-04-18 NOTE — Telephone Encounter (Signed)
Refill request for Metronidazole 500 mg tablets, should I refill this rx patient has an appt here August 24.

## 2022-04-28 ENCOUNTER — Ambulatory Visit: Payer: Medicare Other | Admitting: Dermatology

## 2022-05-05 ENCOUNTER — Ambulatory Visit: Payer: Medicare Other | Admitting: Dermatology

## 2022-05-08 NOTE — Progress Notes (Unsigned)
PROVIDER NOTE: Information contained herein reflects review and annotations entered in association with encounter. Interpretation of such information and data should be left to medically-trained personnel. Information provided to patient can be located elsewhere in the medical record under "Patient Instructions". Document created using STT-dictation technology, any transcriptional errors that may result from process are unintentional.    Patient: Zoe Sanchez  Service Category: E/M  Provider: Gaspar Cola, MD  DOB: [birth year]  DOS: 05/11/2022  Referring Provider: Denton Lank, MD  MRN: [id number]  Specialty: Interventional Pain Management  PCP: Zoe Lank, MD  Type: Established Patient  Setting: Ambulatory outpatient    Location: Office  Delivery: Face-to-face     HPI  [name], a 66 y.o. year old female, is here today because of her No primary diagnosis found.. [name]'s primary complain today is No chief complaint on file. Last encounter: My last encounter with her was on 02/16/2022. Pertinent problems: Ms. Boylen has Myofascial pain syndrome; Cervical facet syndrome (Bilateral) (R>L); Chronic neck pain (4th area of Pain) (Bilateral) (R>L); Chronic pain syndrome; Cervical spondylosis; Fibromyalgia; Cervical herniated disc (C5-6 and C6-7); Cervical foraminal stenosis (Bilateral) (C5-6); Chronic cervical radicular pain (Bilateral) (R>L) (C8 Dermatome); Chronic low back pain (Bilateral) (L>R) w/ sciatica (Bilateral) (L>R); Spondylosis of lumbar spine; Family history of chronic pain; Carpal tunnel syndrome (Left); Chronic shoulder pain (Bilateral); Cervical facet arthropathy; Lumbar facet arthropathy; Bimalleolar fracture of left ankle; Lumbar facet syndrome (Bilateral) (R>L); Chronic upper extremity pain (5th area of Pain) (Bilateral) (R>L); Chronic lower extremity pain (2ry area of Pain) (Bilateral) (L>R); Chronic lumbar radicular pain (Bilateral) (S1); Arthropathy of lumbar facet  joint; Cervical syndrome; Lumbar spondylosis; Synovial cyst of lumbar spine; Prolapsed cervical intervertebral disc; Lumbar facet joint pain; Cervical radiculopathy; Stenosis of intervertebral foramina; Chronic hip pain (3ry area of Pain) (Bilateral); Shoulder pain; Chronic musculoskeletal pain; Neurogenic pain; Closed trimalleolar fracture of left ankle; Bilateral lower extremity edema; Chronic pain of multiple sites; Closed fracture of fifth metatarsal bone; Lumbosacral sensory radiculopathy at S1 (Bilateral); Decreased range of motion of lumbar spine; Numbness and tingling of lower extremity (Bilateral); Chronic low back pain (1ry area of Pain) (Bilateral) w/o sciatica; and Abnormal MRI, lumbar spine (06/05/2021) on their pertinent problem list. Pain Assessment: Severity of   is reported as a  /10. Location:    / . Onset:  . Quality:  . Timing:  . Modifying factor(s):  Marland Kitchen Vitals:  vitals were not taken for this visit.   Reason for encounter:  *** . ***  Pharmacotherapy Assessment  Analgesic: Hydrocodone/APAP 5/325, 1 tab PO q 4 hrs (30 mg/day of hydrocodone)(1,950 mg/day of acetaminophen) MME/day: 30 mg/day.   Monitoring: [place] PMP: PDMP reviewed during this encounter.       Pharmacotherapy: No side-effects or adverse reactions reported. Compliance: No problems identified. Effectiveness: Clinically acceptable.  No notes on file  No results found for: "CBDTHCR" No results found for: "D8THCCBX" No results found for: "D9THCCBX"  UDS:  Summary  Date Value Ref Range Status  02/16/2022 Note  Final    Comment:    ==================================================================== [facility] (MW) ==================================================================== Test                             Result       Flag       Units  Drug Present and Declared for Prescription Verification   Hydrocodone  6353         EXPECTED   ng/mg creat   Hydromorphone                   284          EXPECTED   ng/mg creat   Dihydrocodeine                 1529         EXPECTED   ng/mg creat   Norhydrocodone                 2081         EXPECTED   ng/mg creat    Sources of hydrocodone include scheduled prescription medications.    Hydromorphone, dihydrocodeine and norhydrocodone are expected    metabolites of hydrocodone. Hydromorphone and dihydrocodeine are    also available as scheduled prescription medications.  Drug Present not Declared for Prescription Verification   Oxazepam                       23           UNEXPECTED ng/mg creat    Oxazepam may be administered as a scheduled prescription medication;    it is also an expected metabolite of other benzodiazepine drugs,    including diazepam, chlordiazepoxide, prazepam, clorazepate,    halazepam, and temazepam.  ==================================================================== Test                      Result    Flag   Units      Ref Range   Creatinine              124              mg/dL      >=20 ==================================================================== Declared Medications:  The flagging and interpretation on this report are based on the  following declared medications.  Unexpected results may arise from  inaccuracies in the declared medications.   **Note: The testing scope of this panel includes these medications:   Hydrocodone (Norco)   **Note: The testing scope of this panel does not include the  following reported medications:   Acetaminophen (Norco)  Albuterol  Amitriptyline (Elavil)  Amoxicillin  Cholecalciferol  Cyanocobalamin  Cyclobenzaprine (Flexeril)  Dicyclomine (Bentyl)  Estradiol (Estrace)  Fluticasone (Flonase)  Magnesium (Magonate)  Melatonin  Metronidazole (Flagyl)  Montelukast  Omeprazole  Pregabalin (Lyrica)  Propranolol (Inderal)  Rosuvastatin (Crestor)  Salmeterol  Topical  Umeclidinium (Incruse Ellipta)  Zolpidem  (Ambien) ==================================================================== For clinical consultation, please call [phone]. ====================================================================       ROS  Constitutional: Denies any fever or chills Gastrointestinal: No reported hemesis, hematochezia, vomiting, or acute GI distress Musculoskeletal: Denies any acute onset joint swelling, redness, loss of ROM, or weakness Neurological: No reported episodes of acute onset apraxia, aphasia, dysarthria, agnosia, amnesia, paralysis, loss of coordination, or loss of consciousness  Medication Review  HYDROcodone-acetaminophen, albuterol, amitriptyline, dicyclomine, estradiol, fluticasone, metroNIDAZOLE, montelukast, omeprazole, pregabalin, propranolol, rosuvastatin, salmeterol, umeclidinium bromide, and zolpidem  History Review  Allergy: [name] is allergic to prednisone. Drug: [name]  reports no history of drug use. Alcohol:  reports no history of alcohol use. Tobacco:  reports that she has never smoked. She has never used smokeless tobacco. Social: [name]  reports that she has never smoked. She has never used smokeless tobacco. She reports that she does not drink alcohol and does not use drugs. Medical:  has a past medical history of Abnormal heart rhythm, Abnormal mammogram (02/25/2014), Actinic keratosis, Anemia, Anxiety, Asthma, Awareness of heartbeats (06/29/2015), Basal cell carcinoma (04/24/2008), Basal cell carcinoma (04/09/2009), Basal cell carcinoma (BCC) (12/23/2014), Breath shortness (06/29/2015), CAD (coronary artery disease), Cancer (HCC), Cervical radicular pain (Location of Secondary source of pain) (Bilateral) (R>L) (C8 Dermatome) (06/29/2015), Chronic pain, COPD (chronic obstructive pulmonary disease) ([place]), DDD (degenerative disc disease), cervical, Degenerative disk disease, Depression, Dysrhythmia, Endometriosis, Family history of chronic pain (06/29/2015),  Fibromyalgia, Fibromyalgia, GERD (gastroesophageal reflux disease), Heart murmur, Hiatal hernia, History of attempted suicide (06/29/2015), History of hiatal hernia (06/29/2015), History of psychiatric disorder (06/29/2015), History of suicidal ideation (06/29/2015), Hyperlipidemia, Insomnia, Insomnia, Irritable bowel syndrome, 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 Surgical: [name]  has a past surgical history that includes Abdominal hysterectomy (1986); Tonsillectomy (1957); Skin lesion excision; Oophorectomy (1986); Breast surgery; ORIF ankle fracture (Left, 03/14/2018); ORIF ankle fracture (Left, 07/26/2018); Ankle surgery; Colonoscopy with propofol (N/A, 06/05/2019); Breast biopsy (Right, 2016); Fracture surgery; and Colonoscopy with propofol (N/A, 03/05/2020). Family: family history includes Breast cancer in her maternal aunt; Heart disease in her father; Hypertension in her father and mother.  Laboratory Chemistry Profile   Renal Lab Results  Component Value Date   BUN 21 04/26/2019   CREATININE 0.84 04/26/2019   GFRAA >60 04/26/2019   GFRNONAA >60 04/26/2019    Hepatic Lab Results  Component Value Date   AST 35 04/26/2019   ALT 22 04/26/2019   ALBUMIN 4.1 04/26/2019   ALKPHOS 71 04/26/2019    Electrolytes Lab Results  Component Value Date   NA 135 04/26/2019   K 4.5 04/26/2019   CL 103 04/26/2019   CALCIUM 9.4 04/26/2019   MG 1.8 04/26/2019    Bone Lab Results  Component Value Date   VD25OH 40.8 04/26/2019   25OHVITD1 49 03/09/2016   25OHVITD2 <1.0 03/09/2016   25OHVITD3 48 03/09/2016    Inflammation (CRP: Acute Phase) (ESR: Chronic Phase) Lab Results  Component Value Date   CRP 2.2 (H) 04/26/2019   ESRSEDRATE 39 (H) 04/26/2019         Note: Above Lab results reviewed.  Recent Imaging Review  MR LUMBAR SPINE WO  CONTRAST CLINICAL DATA:  Osteoarthritis, lumbosacral Low back pain, > 6 wks Lumbar radiculopathy, > 6 wks  EXAM: MRI LUMBAR SPINE WITHOUT CONTRAST  TECHNIQUE: Multiplanar, multisequence MR imaging of the lumbar spine was performed. No intravenous contrast was administered.  COMPARISON:  May 2018  FINDINGS: Segmentation:  Standard.  Alignment:  Levocurvature.  Anteroposterior alignment preserved.  Vertebrae: Stable vertebral body heights. No marrow edema. No suspicious osseous lesion.  Conus medullaris and cauda equina: Conus extends to the L1 level. Conus and cauda equina appear normal.  Paraspinal and other soft tissues: Unremarkable.  Disc levels:  L1-L2:  Trace disc bulge.  No canal or foraminal stenosis.  L2-L3: Trace disc bulge with superimposed small left foraminal protrusion. No canal or foraminal stenosis.  L3-L4: Trace disc bulge with superimposed small left foraminal protrusion. Mild facet arthropathy with ligamentum flavum infolding. No canal stenosis. Minimal left subarticular recess narrowing. No right foraminal stenosis. Minimal left foraminal stenosis.  L4-L5: Moderate facet arthropathy with ligamentum flavum infolding. Facet synovial cyst is no longer present. No canal or foraminal stenosis.  L5-S1:  Moderate facet arthropathy.  No canal or foraminal stenosis.  IMPRESSION: Overall mild degenerative changes as detailed above  without progression since 2018 examination. Lower lumbar facet arthropathy.  Electronically Signed   By: Macy Mis M.D.   On: 06/05/2021 15:05 Note: Reviewed        Physical Exam  General appearance: Well nourished, well developed, and well hydrated. In no apparent acute distress Mental status: Alert, oriented x 3 (person, place, & time)       Respiratory: No evidence of acute respiratory distress Eyes: PERLA Vitals: There were no vitals taken for this visit. BMI: Estimated body mass index is 31 kg/m as calculated  from the following:   Height as of 02/16/22: '5\' 3"'  (1.6 m).   Weight as of 02/16/22: 175 lb (79.4 kg). Ideal: Patient weight not recorded  Assessment   Diagnosis Status  No diagnosis found. Controlled Controlled Controlled   Updated Problems: No problems updated.  Plan of Care  Problem-specific:  No problem-specific Assessment & Plan notes found for this encounter.  Zoe Sanchez has a current medication list which includes the following long-term medication(s): albuterol, amitriptyline, dicyclomine, estradiol, fluticasone, hydrocodone-acetaminophen, hydrocodone-acetaminophen, hydrocodone-acetaminophen, montelukast, omeprazole, pregabalin, propranolol, rosuvastatin, and salmeterol.  Pharmacotherapy (Medications Ordered): No orders of the defined types were placed in this encounter.  Orders:  No orders of the defined types were placed in this encounter.  Follow-up plan:   No follow-ups on file.     Interventional Therapies  Risk  Complexity Considerations:   Estimated body mass index is 29.23 kg/m as calculated from the following:   Height as of this encounter: '5\' 3"'  (1.6 m).   Weight as of this encounter: 165 lb (74.8 kg). WNL   Planned  Pending:   Repeat x-rays of the lumbar spine with flexion and extension views.   Under consideration:   Diagnostic/therapeutic bilateral lumbar facet MBB #3  Diagnostic bilateral cervical facet MBB  Diagnostic right CESI  Diagnostic bilateral IA shoulder joint injection  Diagnostic bilateral suprascapular NB  Possible bilateral lumbar facet RFA  Diagnostic left carpal tunnel injection    Completed:   Diagnostic bilateral lumbar facet MBB x2 (w/o steroids) (04/20/2017) (100/60/60/<25)    Therapeutic  Palliative (PRN) options:   none     Recent Visits Date Type Provider Dept  02/16/22 Office Visit Milinda Pointer, MD [facility]  Showing recent visits within past 90 days and meeting all other  requirements Future Appointments Date Type Provider Dept  05/11/22 Appointment Milinda Pointer, MD [facility]  Showing future appointments within next 90 days and meeting all other requirements  I discussed the assessment and treatment plan with the patient. The patient was provided an opportunity to ask questions and all were answered. The patient agreed with the plan and demonstrated an understanding of the instructions.  Patient advised to call back or seek an in-person evaluation if the symptoms or condition worsens.  Duration of encounter: *** minutes.  Total time on encounter, as per AMA guidelines included both the face-to-face and non-face-to-face time personally spent by the physician and/or other qualified health care professional(s) on the day of the encounter (includes time in activities that require the physician or other qualified health care professional and does not include time in activities normally performed by clinical staff). Physician's time may include the following activities when performed: preparing to see the patient (eg, review of tests, pre-charting review of records) obtaining and/or reviewing separately obtained history performing a medically appropriate examination and/or evaluation counseling and educating the patient/family/caregiver ordering medications, tests, or procedures referring and communicating with other health  care professionals (when not separately reported) documenting clinical information in the electronic or other health record independently interpreting results (not separately reported) and communicating results to the patient/ family/caregiver care coordination (not separately reported)  Note by: Zoe Cola, MD Date: 05/11/2022; Time: 9:16 AM

## 2022-05-11 ENCOUNTER — Ambulatory Visit (HOSPITAL_BASED_OUTPATIENT_CLINIC_OR_DEPARTMENT_OTHER): Payer: Medicare Other | Admitting: Pain Medicine

## 2022-05-11 DIAGNOSIS — Z91199 Patient's noncompliance with other medical treatment and regimen due to unspecified reason: Secondary | ICD-10-CM

## 2022-05-11 NOTE — Patient Instructions (Signed)

## 2022-05-17 NOTE — Progress Notes (Unsigned)
PROVIDER NOTE: Information contained herein reflects review and annotations entered in association with encounter. Interpretation of such information and data should be left to medically-trained personnel. Information provided to patient can be located elsewhere in the medical record under "Patient Instructions". Document created using STT-dictation technology, any transcriptional errors that may result from process are unintentional.    Patient: Zoe Sanchez  Service Category: E/M  Provider: Francisco A Naveira, MD  DOB: 06/07/1956  DOS: 05/18/2022  Referring Provider: Patel, Sarah, MD  MRN: [id number]  Specialty: Interventional Pain Management  PCP: Patel, Sarah, MD  Type: Established Patient  Setting: Ambulatory outpatient    Location: Office  Delivery: Face-to-face     HPI  Zoe Sanchez, a 66 y.o. year old female, is here today because of her No primary diagnosis found.. Zoe Sanchez's primary complain today is No chief complaint on file. Last encounter: My last encounter with her was on 05/11/2022. Pertinent problems: Zoe Sanchez has Myofascial pain syndrome; Cervical facet syndrome (Bilateral) (R>L); Chronic neck pain (4th area of Pain) (Bilateral) (R>L); Chronic pain syndrome; Cervical spondylosis; Fibromyalgia; Cervical herniated disc (C5-6 and C6-7); Cervical foraminal stenosis (Bilateral) (C5-6); Chronic cervical radicular pain (Bilateral) (R>L) (C8 Dermatome); Chronic low back pain (Bilateral) (L>R) w/ sciatica (Bilateral) (L>R); Spondylosis of lumbar spine; Family history of chronic pain; Carpal tunnel syndrome (Left); Chronic shoulder pain (Bilateral); Cervical facet arthropathy; Lumbar facet arthropathy; Bimalleolar fracture of left ankle; Lumbar facet syndrome (Bilateral) (R>L); Chronic upper extremity pain (5th area of Pain) (Bilateral) (R>L); Chronic lower extremity pain (2ry area of Pain) (Bilateral) (L>R); Chronic lumbar radicular pain (Bilateral) (S1); Arthropathy of lumbar facet  joint; Cervical syndrome; Lumbar spondylosis; Synovial cyst of lumbar spine; Prolapsed cervical intervertebral disc; Lumbar facet joint pain; Cervical radiculopathy; Stenosis of intervertebral foramina; Chronic hip pain (3ry area of Pain) (Bilateral); Shoulder pain; Chronic musculoskeletal pain; Neurogenic pain; Closed trimalleolar fracture of left ankle; Bilateral lower extremity edema; Chronic pain of multiple sites; Closed fracture of fifth metatarsal bone; Lumbosacral sensory radiculopathy at S1 (Bilateral); Decreased range of motion of lumbar spine; Numbness and tingling of lower extremity (Bilateral); Chronic low back pain (1ry area of Pain) (Bilateral) w/o sciatica; and Abnormal MRI, lumbar spine (06/05/2021) on their pertinent problem list. Pain Assessment: Severity of   is reported as a  /10. Location:    / . Onset:  . Quality:  . Timing:  . Modifying factor(s):  . Vitals:  vitals were not taken for this visit.   Reason for encounter: medication management. ***  RTCB: 07/19/2022 Nonopioids transferred 08/24/2020: Magnesium, Flexeril, Lyrica, melatonin, vitamin B12, and vitamin D3.  Pharmacotherapy Assessment  Analgesic: Hydrocodone/APAP 5/325, 1 tab PO q 4 hrs (30 mg/day of hydrocodone)(1,950 mg/day of acetaminophen) MME/day: 30 mg/day.   Monitoring: [place] PMP: PDMP reviewed during this encounter.       Pharmacotherapy: No side-effects or adverse reactions reported. Compliance: No problems identified. Effectiveness: Clinically acceptable.  No notes on file  No results found for: "CBDTHCR" No results found for: "D8THCCBX" No results found for: "D9THCCBX"  UDS:  Summary  Date Value Ref Range Status  02/16/2022 Note  Final    Comment:    ==================================================================== [facility] (MW) ==================================================================== Test                             Result       Flag       Units  Drug Present and  Declared for   Prescription Verification   Hydrocodone                    6353         EXPECTED   ng/mg creat   Hydromorphone                  284          EXPECTED   ng/mg creat   Dihydrocodeine                 1529         EXPECTED   ng/mg creat   Norhydrocodone                 2081         EXPECTED   ng/mg creat    Sources of hydrocodone include scheduled prescription medications.    Hydromorphone, dihydrocodeine and norhydrocodone are expected    metabolites of hydrocodone. Hydromorphone and dihydrocodeine are    also available as scheduled prescription medications.  Drug Present not Declared for Prescription Verification   Oxazepam                       23           UNEXPECTED ng/mg creat    Oxazepam may be administered as a scheduled prescription medication;    it is also an expected metabolite of other benzodiazepine drugs,    including diazepam, chlordiazepoxide, prazepam, clorazepate,    halazepam, and temazepam.  ==================================================================== Test                      Result    Flag   Units      Ref Range   Creatinine              124              mg/dL      >=20 ==================================================================== Declared Medications:  The flagging and interpretation on this report are based on the  following declared medications.  Unexpected results may arise from  inaccuracies in the declared medications.   **Note: The testing scope of this panel includes these medications:   Hydrocodone (Norco)   **Note: The testing scope of this panel does not include the  following reported medications:   Acetaminophen (Norco)  Albuterol  Amitriptyline (Elavil)  Amoxicillin  Cholecalciferol  Cyanocobalamin  Cyclobenzaprine (Flexeril)  Dicyclomine (Bentyl)  Estradiol (Estrace)  Fluticasone (Flonase)  Magnesium (Magonate)  Melatonin  Metronidazole (Flagyl)  Montelukast  Omeprazole  Pregabalin (Lyrica)  Propranolol  (Inderal)  Rosuvastatin (Crestor)  Salmeterol  Topical  Umeclidinium (Incruse Ellipta)  Zolpidem (Ambien) ==================================================================== For clinical consultation, please call [phone]. ====================================================================       ROS  Constitutional: Denies any fever or chills Gastrointestinal: No reported hemesis, hematochezia, vomiting, or acute GI distress Musculoskeletal: Denies any acute onset joint swelling, redness, loss of ROM, or weakness Neurological: No reported episodes of acute onset apraxia, aphasia, dysarthria, agnosia, amnesia, paralysis, loss of coordination, or loss of consciousness  Medication Review  HYDROcodone-acetaminophen, Magnesium Gluconate, albuterol, amitriptyline, amoxicillin, aspirin EC, betamethasone dipropionate, cyclobenzaprine, dicyclomine, docusate sodium, doxycycline, estradiol, fluticasone, gemfibrozil, metoprolol tartrate, metroNIDAZOLE, montelukast, omeprazole, oxyCODONE-acetaminophen, potassium chloride, pregabalin, propranolol, propranolol ER, rosuvastatin, salmeterol, senna, umeclidinium bromide, and zolpidem  History Review  Allergy: Zoe Sanchez is allergic to prednisone. Drug: Zoe Sanchez  reports no history of drug use. Alcohol:  reports no history of alcohol use. Tobacco:    reports that she has never smoked. She has never used smokeless tobacco. Social: Zoe Sanchez  reports that she has never smoked. She has never used smokeless tobacco. She reports that she does not drink alcohol and does not use drugs. Medical:  has a past medical history of Abnormal heart rhythm, Abnormal mammogram (02/25/2014), Actinic keratosis, Anemia, Anxiety, Asthma, Awareness of heartbeats (06/29/2015), Basal cell carcinoma (04/24/2008), Basal cell carcinoma (04/09/2009), Basal cell carcinoma (BCC) (12/23/2014), Breath shortness (06/29/2015), CAD (coronary artery disease), Cancer (HCC), Cervical  radicular pain (Location of Secondary source of pain) (Bilateral) (R>L) (C8 Dermatome) (06/29/2015), Chronic pain, COPD (chronic obstructive pulmonary disease) (HCC), DDD (degenerative disc disease), cervical, Degenerative disk disease, Depression, Dysrhythmia, Endometriosis, Family history of chronic pain (06/29/2015), Fibromyalgia, Fibromyalgia, GERD (gastroesophageal reflux disease), Heart murmur, Hiatal hernia, History of attempted suicide (06/29/2015), History of hiatal hernia (06/29/2015), History of psychiatric disorder (06/29/2015), History of suicidal ideation (06/29/2015), Hyperlipidemia, Insomnia, Insomnia, Irritable bowel syndrome, 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 Surgical: Zoe Sanchez  has a past surgical history that includes Abdominal hysterectomy (1986); Tonsillectomy (1957); Skin lesion excision; Oophorectomy (1986); Breast surgery; ORIF ankle fracture (Left, 03/14/2018); ORIF ankle fracture (Left, 07/26/2018); Ankle surgery; Colonoscopy with propofol (N/A, 06/05/2019); Breast biopsy (Right, 2016); Fracture surgery; and Colonoscopy with propofol (N/A, 03/05/2020). Family: family history includes Breast cancer in her maternal aunt; Heart disease in her father; Hypertension in her father and mother.  Laboratory Chemistry Profile   Renal Lab Results  Component Value Date   BUN 21 04/26/2019   CREATININE 0.84 04/26/2019   GFRAA >60 04/26/2019   GFRNONAA >60 04/26/2019    Hepatic Lab Results  Component Value Date   AST 35 04/26/2019   ALT 22 04/26/2019   ALBUMIN 4.1 04/26/2019   ALKPHOS 71 04/26/2019    Electrolytes Lab Results  Component Value Date   NA 135 04/26/2019   K 4.5 04/26/2019   CL 103 04/26/2019   CALCIUM 9.4 04/26/2019   MG 1.8 04/26/2019    Bone Lab Results  Component Value Date   VD25OH 40.8 04/26/2019    25OHVITD1 49 03/09/2016   25OHVITD2 <1.0 03/09/2016   25OHVITD3 48 03/09/2016    Inflammation (CRP: Acute Phase) (ESR: Chronic Phase) Lab Results  Component Value Date   CRP 2.2 (H) 04/26/2019   ESRSEDRATE 39 (H) 04/26/2019         Note: Above Lab results reviewed.  Recent Imaging Review  MR LUMBAR SPINE WO CONTRAST CLINICAL DATA:  Osteoarthritis, lumbosacral Low back pain, > 6 wks Lumbar radiculopathy, > 6 wks  EXAM: MRI LUMBAR SPINE WITHOUT CONTRAST  TECHNIQUE: Multiplanar, multisequence MR imaging of the lumbar spine was performed. No intravenous contrast was administered.  COMPARISON:  May 2018  FINDINGS: Segmentation:  Standard.  Alignment:  Levocurvature.  Anteroposterior alignment preserved.  Vertebrae: Stable vertebral body heights. No marrow edema. No suspicious osseous lesion.  Conus medullaris and cauda equina: Conus extends to the L1 level. Conus and cauda equina appear normal.  Paraspinal and other soft tissues: Unremarkable.  Disc levels:  L1-L2:  Trace disc bulge.  No canal or foraminal stenosis.  L2-L3: Trace disc bulge with superimposed small left foraminal protrusion. No canal or foraminal stenosis.  L3-L4: Trace disc bulge with superimposed small left foraminal protrusion. Mild facet arthropathy with ligamentum flavum infolding. No canal stenosis. Minimal left subarticular recess narrowing. No right foraminal stenosis. Minimal left foraminal   stenosis.  L4-L5: Moderate facet arthropathy with ligamentum flavum infolding. Facet synovial cyst is no longer present. No canal or foraminal stenosis.  L5-S1:  Moderate facet arthropathy.  No canal or foraminal stenosis.  IMPRESSION: Overall mild degenerative changes as detailed above without progression since 2018 examination. Lower lumbar facet arthropathy.  Electronically Signed   By: Macy Mis M.D.   On: 06/05/2021 15:05 Note: Reviewed        Physical Exam  General appearance: Well  nourished, well developed, and well hydrated. In no apparent acute distress Mental status: Alert, oriented x 3 (person, place, & time)       Respiratory: No evidence of acute respiratory distress Eyes: PERLA Vitals: There were no vitals taken for this visit. BMI: Estimated body mass index is 31 kg/m as calculated from the following:   Height as of 02/16/22: 5' 3" (1.6 m).   Weight as of 02/16/22: 175 lb (79.4 kg). Ideal: Patient weight not recorded  Assessment   Diagnosis Status  No diagnosis found. Controlled Controlled Controlled   Updated Problems: No problems updated.  Plan of Care  Problem-specific:  No problem-specific Assessment & Plan notes found for this encounter.  Zoe Sanchez has a current medication list which includes the following long-term medication(s): metoprolol tartrate, propranolol er, albuterol, amitriptyline, dicyclomine, estradiol, fluticasone, hydrocodone-acetaminophen, metoprolol tartrate, montelukast, omeprazole, pregabalin, pregabalin, propranolol, propranolol er, propranolol er, rosuvastatin, serevent diskus, and salmeterol.  Pharmacotherapy (Medications Ordered): No orders of the defined types were placed in this encounter.  Orders:  No orders of the defined types were placed in this encounter.  Follow-up plan:   No follow-ups on file.     Interventional Therapies  Risk  Complexity Considerations:   Estimated body mass index is 29.23 kg/m as calculated from the following:   Height as of this encounter: 5' 3" (1.6 m).   Weight as of this encounter: 165 lb (74.8 kg). WNL   Planned  Pending:   Repeat x-rays of the lumbar spine with flexion and extension views.   Under consideration:   Diagnostic/therapeutic bilateral lumbar facet MBB #3  Diagnostic bilateral cervical facet MBB  Diagnostic right CESI  Diagnostic bilateral IA shoulder joint injection  Diagnostic bilateral suprascapular NB  Possible bilateral lumbar facet RFA   Diagnostic left carpal tunnel injection    Completed:   Diagnostic bilateral lumbar facet MBB x2 (w/o steroids) (04/20/2017) (100/60/60/<25)    Therapeutic  Palliative (PRN) options:   none     Recent Visits Date Type Provider Dept  02/16/22 Office Visit Milinda Pointer, MD [facility]  Showing recent visits within past 90 days and meeting all other requirements Future Appointments Date Type Provider Dept  05/18/22 Appointment Milinda Pointer, MD [facility]  Showing future appointments within next 90 days and meeting all other requirements  I discussed the assessment and treatment plan with the patient. The patient was provided an opportunity to ask questions and all were answered. The patient agreed with the plan and demonstrated an understanding of the instructions.  Patient advised to call back or seek an in-person evaluation if the symptoms or condition worsens.  Duration of encounter: *** minutes.  Total time on encounter, as per AMA guidelines included both the face-to-face and non-face-to-face time personally spent by the physician and/or other qualified health care professional(s) on the day of the encounter (includes time in activities that require the physician or other qualified health care professional and does not include time in activities normally performed  by clinical staff). Physician's time may include the following activities when performed: preparing to see the patient (eg, review of tests, pre-charting review of records) obtaining and/or reviewing separately obtained history performing a medically appropriate examination and/or evaluation counseling and educating the patient/family/caregiver ordering medications, tests, or procedures referring and communicating with other health care professionals (when not separately reported) documenting clinical information in the electronic or other health record independently interpreting results  (not separately reported) and communicating results to the patient/ family/caregiver care coordination (not separately reported)  Note by: Gaspar Cola, MD Date: 05/18/2022; Time: 12:14 PM

## 2022-05-17 NOTE — Patient Instructions (Signed)

## 2022-05-18 ENCOUNTER — Encounter: Payer: Self-pay | Admitting: Pain Medicine

## 2022-05-18 ENCOUNTER — Ambulatory Visit: Payer: Medicare Other | Attending: Pain Medicine | Admitting: Pain Medicine

## 2022-05-18 VITALS — BP 118/82 | HR 81 | Temp 97.4°F | Ht 64.0 in | Wt 170.0 lb

## 2022-05-18 DIAGNOSIS — M25551 Pain in right hip: Secondary | ICD-10-CM | POA: Diagnosis present

## 2022-05-18 DIAGNOSIS — M79602 Pain in left arm: Secondary | ICD-10-CM | POA: Diagnosis present

## 2022-05-18 DIAGNOSIS — Z79899 Other long term (current) drug therapy: Secondary | ICD-10-CM | POA: Insufficient documentation

## 2022-05-18 DIAGNOSIS — M542 Cervicalgia: Secondary | ICD-10-CM | POA: Diagnosis present

## 2022-05-18 DIAGNOSIS — M47812 Spondylosis without myelopathy or radiculopathy, cervical region: Secondary | ICD-10-CM | POA: Diagnosis present

## 2022-05-18 DIAGNOSIS — M797 Fibromyalgia: Secondary | ICD-10-CM | POA: Diagnosis present

## 2022-05-18 DIAGNOSIS — G8929 Other chronic pain: Secondary | ICD-10-CM | POA: Diagnosis present

## 2022-05-18 DIAGNOSIS — M79604 Pain in right leg: Secondary | ICD-10-CM | POA: Insufficient documentation

## 2022-05-18 DIAGNOSIS — Z79891 Long term (current) use of opiate analgesic: Secondary | ICD-10-CM | POA: Insufficient documentation

## 2022-05-18 DIAGNOSIS — M25552 Pain in left hip: Secondary | ICD-10-CM | POA: Diagnosis present

## 2022-05-18 DIAGNOSIS — M79601 Pain in right arm: Secondary | ICD-10-CM | POA: Diagnosis present

## 2022-05-18 DIAGNOSIS — G894 Chronic pain syndrome: Secondary | ICD-10-CM | POA: Diagnosis present

## 2022-05-18 DIAGNOSIS — M79605 Pain in left leg: Secondary | ICD-10-CM | POA: Diagnosis present

## 2022-05-18 DIAGNOSIS — M5459 Other low back pain: Secondary | ICD-10-CM | POA: Insufficient documentation

## 2022-05-18 DIAGNOSIS — M545 Low back pain, unspecified: Secondary | ICD-10-CM | POA: Diagnosis present

## 2022-05-18 MED ORDER — HYDROCODONE-ACETAMINOPHEN 5-325 MG PO TABS: 1.0000 | ORAL_TABLET | ORAL | 0 refills | Status: DC | PRN

## 2022-05-18 NOTE — Progress Notes (Signed)
Nursing Pain Medication Assessment:  Safety precautions to be maintained throughout the outpatient stay will include: orient to surroundings, keep bed in low position, maintain call bell within reach at all times, provide assistance with transfer out of bed and ambulation.  Medication Inspection Compliance: Pill count conducted under aseptic conditions, in front of the patient. Neither the pills nor the bottle was removed from the patient's sight at any time. Once count was completed pills were immediately returned to the patient in their original bottle.  Medication: Hydrocodone/APAP Pill/Patch Count:  51 of 180 pills remain Pill/Patch Appearance: Markings consistent with prescribed medication Bottle Appearance: Standard pharmacy container. Clearly labeled. Filled Date: 8 / 9 / 2023 Last Medication intake:  TodaySafety precautions to be maintained throughout the outpatient stay will include: orient to surroundings, keep bed in low position, maintain call bell within reach at all times, provide assistance with transfer out of bed and ambulation.

## 2022-07-01 ENCOUNTER — Other Ambulatory Visit: Payer: Self-pay | Admitting: Dermatology

## 2022-07-01 DIAGNOSIS — L439 Lichen planus, unspecified: Secondary | ICD-10-CM

## 2022-07-04 NOTE — Telephone Encounter (Signed)
Patient advised that she will need a follow up appointment in three months for Dr. Nehemiah Massed to continue prescribing medication. She states she will call back to schedule follow up appointment.

## 2022-07-29 ENCOUNTER — Other Ambulatory Visit: Payer: Self-pay | Admitting: Family Medicine

## 2022-07-29 DIAGNOSIS — Z1231 Encounter for screening mammogram for malignant neoplasm of breast: Secondary | ICD-10-CM

## 2022-08-06 NOTE — Progress Notes (Signed)
NO-SHOW to medication management appointment (08/08/2022).

## 2022-08-07 NOTE — Patient Instructions (Signed)
____________________________________________________________________________________________  Patient Information update  To: All of our patients.  Re: Name change.  It has been made official that our current name, "[facility]"   will soon be changed to "[facility]".   The purpose of this change is to eliminate any confusion created by the concept of our practice being a "Medication Management Pain Clinic". In the past this has led to the misconception that we treat pain primarily by the use of prescription medications.  Nothing can be farther from the truth.   Understanding PAIN MANAGEMENT: To further understand what our practice does, you first have to understand that "Pain Management" is a subspecialty that requires additional training once a physician has completed their specialty training, which can be in either Anesthesia, Neurology, Psychiatry, or Physical Medicine and Rehabilitation (PMR). Each one of these contributes to the final approach taken by each physician to the management of their patient's pain. To be a "Pain Management Specialist" you must have first completed one of the specialty trainings below.  Anesthesiologists - trained in clinical pharmacology and interventional techniques such as nerve blockade and regional as well as central neuroanatomy. They are trained to block pain before, during, and after surgical interventions.  Neurologists - trained in the diagnosis and pharmacological treatment of complex neurological conditions, such as Multiple Sclerosis, Parkinson's, spinal cord injuries, and other systemic conditions that may be associated with symptoms that may include but are not limited to pain. They tend to rely primarily on the treatment of chronic pain using prescription medications.  Psychiatrist - trained in conditions affecting the psychosocial  wellbeing of patients including but not limited to depression, anxiety, schizophrenia, personality disorders, addiction, and other substance use disorders that may be associated with chronic pain. They tend to rely primarily on the treatment of chronic pain using prescription medications.   Physical Medicine and Rehabilitation (PMR) physicians, also known as physiatrists - trained to treat a wide variety of medical conditions affecting the brain, spinal cord, nerves, bones, joints, ligaments, muscles, and tendons. Their training is primarily aimed at treating patients that have suffered injuries that have caused severe physical impairment. Their training is primarily aimed at the physical therapy and rehabilitation of those patients. They may also work alongside orthopedic surgeons or neurosurgeons using their expertise in assisting surgical patients to recover after their surgeries.  INTERVENTIONAL PAIN MANAGEMENT is sub-subspecialty of Pain Management.  Our physicians are Board-certified in Anesthesia, Pain Management, and Interventional Pain Management.  This meaning that not only have they been trained and Board-certified in their specialty of Anesthesia, and subspecialty of Pain Management, but they have also received further training in the sub-subspecialty of Interventional Pain Management, in order to become Board-certified as INTERVENTIONAL PAIN MANAGEMENT SPECIALIST.    Mission: Our goal is to use our skills in  INTERVENTIONAL PAIN MANAGEMENT as alternatives to the chronic use of prescription opioid medications for the treatment of pain. To make this more clear, we have changed our name to reflect what we do and offer. We will continue to offer medication management assessment and recommendations, but we will not be taking over any patient's medication management.  ____________________________________________________________________________________________      _______________________________________________________________________  Medication Rules  Purpose: To inform patients, and their family members, of our medication rules and regulations.  Applies to: All patients receiving prescriptions from our practice (written or electronic).  Pharmacy of record: This is the pharmacy where your electronic prescriptions   will be sent. Make sure we have the correct one.  Electronic prescriptions: In compliance with the [place]rengthen Opioid Misuse Prevention (STOP) Act of 2017 (Session Law 2017-74/H243), effective September 12, 2018, all controlled substances must be electronically prescribed. Written prescriptions, faxing, or calling prescriptions to a pharmacy will no longer be done.  Prescription refills: These will be provided only during in-person appointments. No medications will be renewed without a "face-to-face" evaluation with your provider. Applies to all prescriptions.  NOTE: The following applies primarily to controlled substances (Opioid* Pain Medications).   Type of encounter (visit): For patients receiving controlled substances, face-to-face visits are required. (Not an option and not up to the patient.)  Patient's responsibilities: Pain Pills: Bring all pain pills to every appointment (except for procedure appointments). Pill Bottles: Bring pills in original pharmacy bottle. Bring bottle, even if empty. Always bring the bottle of the most recent fill.  Medication refills: You are responsible for knowing and keeping track of what medications you are taking and when is it that you will need a refill. The day before your appointment: write a list of all prescriptions that need to be refilled. The day of the appointment: give the list to the admitting nurse. Prescriptions will be written only during appointments. No prescriptions will be written on procedure days. If you forget a medication: it will not be "Called in", "Faxed", or  "electronically sent". You will need to get another appointment to get these prescribed. No early refills. Do not call asking to have your prescription filled early. Partial  or short prescriptions: Occasionally your pharmacy may not have enough pills to fill your prescription.  NEVER ACCEPT a partial fill or a prescription that is short of the total amount of pills that you were prescribed.  With controlled substances the law allows 72 hours for the pharmacy to complete the prescription.  If the prescription is not completed within 72 hours, the pharmacist will require a new prescription to be written. This means that you will be short on your medicine and we WILL NOT send another prescription to complete your original prescription.  Instead, request the pharmacy to send a carrier to a nearby branch to get enough medication to provide you with your full prescription. Prescription Accuracy: You are responsible for carefully inspecting your prescriptions before leaving our office. Have the discharge nurse carefully go over each prescription with you, before taking them home. Make sure that your name is accurately spelled, that your address is correct. Check the name and dose of your medication to make sure it is accurate. Check the number of pills, and the written instructions to make sure they are clear and accurate. Make sure that you are given enough medication to last until your next medication refill appointment. Taking Medication: Take medication as prescribed. When it comes to controlled substances, taking less pills or less frequently than prescribed is permitted and encouraged. Never take more pills than instructed. Never take the medication more frequently than prescribed.  Inform other Doctors: Always inform, all of your healthcare providers, of all the medications you take. Pain Medication from other Providers: You are not allowed to accept any additional pain medication from any other Doctor or  Healthcare provider. There are two exceptions to this rule. (see below) In the event that you require additional pain medication, you are responsible for notifying us, as stated below. Cough Medicine: Often these contain an opioid, such as codeine or hydrocodone. Never accept or take cough medicine containing   these opioids if you are already taking an opioid* medication. The combination may cause respiratory failure and death. Medication Agreement: You are responsible for carefully reading and following our Medication Agreement. This must be signed before receiving any prescriptions from our practice. Safely store a copy of your signed Agreement. Violations to the Agreement will result in no further prescriptions. (Additional copies of our Medication Agreement are available upon request.) Laws, Rules, & Regulations: All patients are expected to follow all Federal and State Laws, Statutes, Rules, & Regulations. Ignorance of the Laws does not constitute a valid excuse.  Illegal drugs and Controlled Substances: The use of illegal substances (including, but not limited to marijuana and its derivatives) and/or the illegal use of any controlled substances is strictly prohibited. Violation of this rule may result in the immediate and permanent discontinuation of any and all prescriptions being written by our practice. The use of any illegal substances is prohibited. Adopted CDC guidelines & recommendations: Target dosing levels will be at or below 60 MME/day. Use of benzodiazepines** is not recommended.  Exceptions: There are only two exceptions to the rule of not receiving pain medications from other Healthcare Providers. Exception #1 (Emergencies): In the event of an emergency (i.e.: accident requiring emergency care), you are allowed to receive additional pain medication. However, you are responsible for: As soon as you are able, call our office [phone], at any time of the day or night, and leave a  message stating your name, the date and nature of the emergency, and the name and dose of the medication prescribed. In the event that your call is answered by a member of our staff, make sure to document and save the date, time, and the name of the person that took your information.  Exception #2 (Planned Surgery): In the event that you are scheduled by another doctor or dentist to have any type of surgery or procedure, you are allowed (for a period no longer than 30 days), to receive additional pain medication, for the acute post-op pain. However, in this case, you are responsible for picking up a copy of our "Post-op Pain Management for Surgeons" handout, and giving it to your surgeon or dentist. This document is available at our office, and does not require an appointment to obtain it. Simply go to our office during business hours (Monday-Thursday from 8:00 AM to 4:00 PM) (Friday 8:00 AM to 12:00 Noon) or if you have a scheduled appointment with us, prior to your surgery, and ask for it by name. In addition, you are responsible for: calling our office [phone], at any time of the day or night, and leaving a message stating your name, name of your surgeon, type of surgery, and date of procedure or surgery. Failure to comply with your responsibilities may result in termination of therapy involving the controlled substances. Medication Agreement Violation. Following the above rules, including your responsibilities will help you in avoiding a Medication Agreement Violation ("Breaking your Pain Medication Contract").  Consequences:  Not following the above rules may result in permanent discontinuation of medication prescription therapy.  *Opioid medications include: morphine, codeine, oxycodone, oxymorphone, hydrocodone, hydromorphone, meperidine, tramadol, tapentadol, buprenorphine, fentanyl, methadone. **Benzodiazepine medications include: diazepam (Valium), alprazolam (Xanax), clonazepam (Klonopine),  lorazepam (Ativan), clorazepate (Tranxene), chlordiazepoxide (Librium), estazolam (Prosom), oxazepam (Serax), temazepam (Restoril), triazolam (Halcion) (Last updated: 07/05/2022) ______________________________________________________________________    ______________________________________________________________________  Medication Recommendations and Reminders  Applies to: All patients receiving prescriptions (written and/or electronic).  Medication Rules & Regulations: You are responsible   for reading, knowing, and following our "Medication Rules" document. These exist for your safety and that of others. They are not flexible and neither are we. Dismissing or ignoring them is an act of "non-compliance" that may result in complete and irreversible termination of such medication therapy. For safety reasons, "non-compliance" will not be tolerated. As with the [place] fundamental legal principle of "ignorance of the law is no defense", we will accept no excuses for not having read and knowing the content of documents provided to you by our practice.  Pharmacy of record:  Definition: This is the pharmacy where your electronic prescriptions will be sent.  We do not endorse any particular pharmacy. It is up to you and your insurance to decide what pharmacy to use.  We do not restrict you in your choice of pharmacy. However, once we write for your prescriptions, we will NOT be re-sending more prescriptions to fix restricted supply problems created by your pharmacy, or your insurance.  The pharmacy listed in the electronic medical record should be the one where you want electronic prescriptions to be sent. If you choose to change pharmacy, simply notify our nursing staff. Changes will be made only during your regular appointments and not over the phone.  Recommendations: Keep all of your pain medications in a safe place, under lock and key, even if you live alone. We will NOT replace lost, stolen, or  damaged medication. We do not accept "Police Reports" as proof of medications having been stolen. After you fill your prescription, take 1 week's worth of pills and put them away in a safe place. You should keep a separate, properly labeled bottle for this purpose. The remainder should be kept in the original bottle. Use this as your primary supply, until it runs out. Once it's gone, then you know that you have 1 week's worth of medicine, and it is time to come in for a prescription refill. If you do this correctly, it is unlikely that you will ever run out of medicine. To make sure that the above recommendation works, it is very important that you make sure your medication refill appointments are scheduled at least 1 week before you run out of medicine. To do this in an effective manner, make sure that you do not leave the office without scheduling your next medication management appointment. Always ask the nursing staff to show you in your prescription , when your medication will be running out. Then arrange for the receptionist to get you a return appointment, at least 7 days before you run out of medicine. Do not wait until you have 1 or 2 pills left, to come in. This is very poor planning and does not take into consideration that we may need to cancel appointments due to bad weather, sickness, or emergencies affecting our staff. DO NOT ACCEPT A "Partial Fill": If for any reason your pharmacy does not have enough pills/tablets to completely fill or refill your prescription, do not allow for a "partial fill". The law allows the pharmacy to complete that prescription within 72 hours, without requiring a new prescription. If they do not fill the rest of your prescription within those 72 hours, you will need a separate prescription to fill the remaining amount, which we will NOT provide. If the reason for the partial fill is your insurance, you will need to talk to the pharmacist about payment alternatives for  the remaining tablets, but again, DO NOT ACCEPT A PARTIAL FILL, unless you can trust   your pharmacist to obtain the remainder of the pills within 72 hours.  Prescription refills and/or changes in medication(s):  Prescription refills, and/or changes in dose or medication, will be conducted only during scheduled medication management appointments. (Applies to both, written and electronic prescriptions.) No refills on procedure days. No medication will be changed or started on procedure days. No changes, adjustments, and/or refills will be conducted on a procedure day. Doing so will interfere with the diagnostic portion of the procedure. No phone refills. No medications will be "called into the pharmacy". No Fax refills. No weekend refills. No Holliday refills. No after hours refills.  Remember:  Business hours are:  Monday to Thursday 8:00 AM to 4:00 PM Provider's Schedule: Audrie Kuri, MD - Appointments are:  Medication management: Monday and Wednesday 8:00 AM to 4:00 PM Procedure day: Tuesday and Thursday 7:30 AM to 4:00 PM Bilal Lateef, MD - Appointments are:  Medication management: Tuesday and Thursday 8:00 AM to 4:00 PM Procedure day: Monday and Wednesday 7:30 AM to 4:00 PM (Last update: 07/05/2022) ______________________________________________________________________    ____________________________________________________________________________________________  Pharmacy Shortages of Pain Medication   Introduction Shockingly as it may seem, .  "No U.[place] Supreme Court decision has ever interpreted the Constitution as guaranteeing a right to health care for all Americans." - https://www.healthequityandpolicylab.com/elusive-right-to-health-care-under-us-law  "With respect to human rights, the [place] has no formally codified right to health, nor does it participate in a human rights treaty that specifies a right to health." - Scott J. Schweikart, JD,  MBE  Situation By now, most of our patients have had the experience of being told by their pharmacist that they do not have enough medication to cover their prescription. If you have not had this experience, just know that you soon will.  Problem There appears to be a shortage of these medications, either at the national level or locally. This is happening with all pharmacies. When there is not enough medication, patients are offered a partial fill and they are told that they will try to get the rest of the medicine for them at a later time. If they do not have enough for even a partial fill, the pharmacists are telling the patients to call us (the prescribing physicians) to request that we send another prescription to another pharmacy to get the medicine.   This reordering of a controlled substance creates documentation problems where additional paperwork needs to be created to explain why two prescriptions for the same period of time and the same medicine are being prescribed to the same patient. It also creates situations where the last appointment note does not accurately reflect when and what prescriptions were given to a patient. This leads to prescribing errors down the line, in subsequent follow-up visits.   [place] Board of Pharmacy ([place]BOP) Research revealed that Board of Pharmacy Rule .1806 (21 NCAC 46.1806) authorizes pharmacists to the transfer of prescriptions among pharmacies, and it sets forth procedural and recordkeeping requirements for doing so. However, this requires the pharmacist to complete the previously mentioned procedural paperwork to accomplish the transfer. As it turns out, it is much easier for them to have the prescribing physicians do the work.   Possible solutions 1. You can ask your physician to assist you in weaning yourself off these medications. 2. Ask your pharmacy if the medication is in stock, 3 days prior to your refill. 3. If you need a pharmacy change,  let us know at your medication management visit. Prescriptions that have already been   electronically sent to a pharmacy will not be re-sent to a different pharmacy if your pharmacy of record does not have it in stock. Proper stocking of medication is a pharmacy problem, not a prescriber problem. Work with your pharmacist to solve the problem. 4. Have the [place] Assembly add a provision to the "STOP ACT" (the law that mandates how controlled substances are prescribed) where there is an exception to the electronic prescribing rule that states that in the event there are shortages of medications the physicians are allowed to use written prescriptions as opposed to electronic ones. This would allow patients to take their prescriptions to a different pharmacy that may have enough medication available to fill the prescription. The problem is that currently there is a law that does not allow for written prescriptions, with the exception of instances where the electronic medical record is down due to technical issues.  5. Have [place] Congress ease the pressure on pharmaceutical companies, allowing them to produce enough quantities of the medication to adequately supply the population. 6. Have pharmacies keep enough stocks of these medications to cover their client base.  7. Have the [place] State Assembly add a provision to the "STOP ACT" where they ease the regulations surrounding the transfer of controlled substances between pharmacies, so as to simplify the transfer of supplies. As an alternative, develop a system to allow patients to obtain the remainder of their prescription at another one of their pharmacies or at an associate pharmacy.   How this shortage will affect you.  Understand that this is a pharmacy supply problem, not a prescriber problem. Work with your pharmacy to solve it. The job of the prescriber is to evaluate and monitor the patient for the appropriate indications and use of  these medicines. It is not the job of the prescriber to supply the medication or to solve problems with that supply. The responsibility and the choice to obtain the medication resides on the patient. By law, supplying the medication is the job of the pharmacy. It is certainly not the job of the prescriber to solve supply problems.   Due to the above problems we are no longer taking patients to write for their pain medication. Future discussions with your physician may include potentially weaning medications or transitioning to alternatives.  We will be focusing primarily on interventional based pain management. We will continue to evaluate for appropriate indications and we may provide recommendations regarding medication, dose, and schedule, as well as monitoring recommendations, however, we will not be taking over the actual prescribing of these substances. On those patients where we are treating their chronic pain with interventional therapies, exceptions will be considered on a case by case basis. At this time, we will try to continue providing this supplemental service to those patients we have been managing in the past. However, as of August 1st, 2023, we no longer will be sending additional prescriptions to other pharmacies for the purpose of solving their supply problems. Once we send a prescription to a pharmacy, we will not be resending it again to another pharmacy to cover for their shortages.   What to do. Write as many letters as you can. Recruit the help of family members in writing these letters. Below are some of the places where you can write to make your voice heard. Let them know what the problem is and push them to look for solutions.   Search internet for: "[place] find your legislators" https://www.ncleg.gov/findyourlegislators  Search   internet for: "[employer] commissioner  complaints" Starlas.fi  Search internet for: "[place] complaints" https://www.hernandez-brewer.com/.htm  Search internet for: "[facility] pharmacy complaints" Email [facility] Customer Relations woondaal.com.jsp?callType=store  Search internet for: Programme researcher, broadcasting/film/video customer service complaints" https://www.[facility].com/topic/marketing/contactus/contactus_customerservice.jsp  ____________________________________________________________________________________________     ____________________________________________________________________________________________  Drug Holidays (Slow)  What is a "Drug Holiday"? Drug Holiday: is the name given to the period of time during which a patient stops taking a medication(s) for the purpose of eliminating tolerance to the drug.  Benefits Improved effectiveness of opioids. Decreased opioid dose needed to achieve benefits. Improved pain with lesser dose.  What is tolerance? Tolerance: is the progressive decreased in effectiveness of a drug due to its repetitive use. With repetitive use, the body gets use to the medication and as a consequence, it loses its effectiveness. This is a common problem seen with opioid pain medications. As a result, a larger dose of the drug is needed to achieve the same effect that used to be obtained with a smaller dose.  How long should a "Drug Holiday" last? You should stay off of the pain medicine for at least 14 consecutive days. (2 weeks)  Should I stop the medicine "cold [place]"? No. You should always coordinate with your Pain Specialist so that he/she can provide you with the correct medication dose to make the transition as smoothly as possible.  How do I stop the medicine? Slowly. You will be instructed to decrease the daily amount of pills that you take by one (1) pill every seven (7) days.  This is called a "slow downward taper" of your dose. For example: if you normally take four (4) pills per day, you will be asked to drop this dose to three (3) pills per day for seven (7) days, then to two (2) pills per day for seven (7) days, then to one (1) per day for seven (7) days, and at the end of those last seven (7) days, this is when the "Drug Holiday" would start.   Will I have withdrawals? By doing a "slow downward taper" like this one, it is unlikely that you will experience any significant withdrawal symptoms. Typically, what triggers withdrawals is the sudden stop of a high dose opioid therapy. Withdrawals can usually be avoided by slowly decreasing the dose over a prolonged period of time. If you do not follow these instructions and decide to stop your medication abruptly, withdrawals may be possible.  What are withdrawals? Withdrawals: refers to the wide range of symptoms that occur after stopping or dramatically reducing opiate drugs after heavy and prolonged use. Withdrawal symptoms do not occur to patients that use low dose opioids, or those who take the medication sporadically. Contrary to benzodiazepine (example: Valium, Xanax, etc.) or alcohol withdrawals ("Delirium Tremens"), opioid withdrawals are not lethal. Withdrawals are the physical manifestation of the body getting rid of the excess receptors.  Expected Symptoms Early symptoms of withdrawal may include: Agitation Anxiety Muscle aches Increased tearing Insomnia Runny nose Sweating Yawning  Late symptoms of withdrawal may include: Abdominal cramping Diarrhea Dilated pupils Goose bumps Nausea Vomiting  Will I experience withdrawals? Due to the slow nature of the taper, it is very unlikely that you will experience any.  What is a slow taper? Taper: refers to the gradual decrease in dose.  (Last update:  04/01/2020) ____________________________________________________________________________________________    ____________________________________________________________________________________________  CBD (cannabidiol) & Delta-8 (Delta-8 tetrahydrocannabinol) WARNING  Intro: Cannabidiol (CBD) and tetrahydrocannabinol (THC), are two natural compounds found in  plants of the Cannabis genus. They can both be extracted from hemp or cannabis. Hemp and cannabis come from the Cannabis sativa plant. Both compounds interact with your body's endocannabinoid system, but they have very different effects. CBD does not produce the high sensation associated with cannabis. Delta-8 tetrahydrocannabinol, also known as delta-8 THC, is a psychoactive substance found in the Cannabis sativa plant, of which marijuana and hemp are two varieties. THC is responsible for the high associated with the illicit use of marijuana.  Applicable to: All individuals currently taking or considering taking CBD (cannabidiol) and, more important, all patients taking opioid analgesic controlled substances (pain medication). (Example: oxycodone; oxymorphone; hydrocodone; hydromorphone; morphine; methadone; tramadol; tapentadol; fentanyl; buprenorphine; butorphanol; dextromethorphan; meperidine; codeine; etc.)  Legal status: CBD remains a Schedule I drug prohibited for any use. CBD is illegal with one exception. In the [place], CBD has a limited Transport planner (FDA) approval for the treatment of two specific types of epilepsy disorders. Only one CBD product has been approved by the FDA for this purpose: "Epidiolex". FDA is aware that some companies are marketing products containing cannabis and cannabis-derived compounds in ways that violate the [employer], Drug and Cosmetic Act [facility] Act) and that may put the health and safety of consumers at risk. The FDA, a Federal agency, has not enforced the CBD status since 2018.  UPDATE: (10/29/2021) The Drug Enforcement Agency ([place]) issued a letter stating that "delta" cannabinoids, including Delta-8-THCO and Delta-9-THCO, synthetically derived from hemp do not qualify as hemp and will be viewed as Schedule I drugs. (Schedule I drugs, substances, or chemicals are defined as drugs with no currently accepted medical use and a high potential for abuse. Some examples of Schedule I drugs are: heroin, lysergic acid diethylamide (LSD), marijuana (cannabis), 3,4-methylenedioxymethamphetamine (ecstasy), methaqualone, and peyote.) (https://jennings.com/)  Legality: Some manufacturers ship CBD products nationally, which is illegal. Often such products are sold online and are therefore available throughout the country. CBD is openly sold in head shops and health food stores in some states where such sales have not been explicitly legalized. Selling unapproved products with unsubstantiated therapeutic claims is not only a violation of the law, but also can put patients at risk, as these products have not been proven to be safe or effective. Federal illegality makes it difficult to conduct research on CBD.  Reference: "FDA Regulation of Cannabis and Cannabis-Derived Products, Including Cannabidiol (CBD)" - [employer]Artists.[employer].pt  Warning: CBD is not FDA approved and has not undergo the same manufacturing controls as prescription drugs.  This means that the purity and safety of available CBD may be questionable. Most of the time, despite manufacturer's claims, it is contaminated with THC (delta-9-tetrahydrocannabinol - the chemical in marijuana responsible for the "HIGH").  When this is the case, the [facility] contaminant will trigger a positive urine drug screen (UDS) test for Marijuana (carboxy-THC). Because a positive UDS for any illicit substance is a violation of our medication agreement, your  opioid analgesics (pain medicine) may be permanently discontinued. The FDA recently put out a warning about 5 things that everyone should be aware of regarding Delta-8 THC: Delta-8 THC products have not been evaluated or approved by the FDA for safe use and may be marketed in ways that put the public health at risk. The FDA has received adverse event reports involving delta-8 THC-containing products. Delta-8 THC has psychoactive and intoxicating effects. Delta-8 THC manufacturing often involve use of potentially harmful chemicals to create the concentrations of delta-8 THC claimed in  the marketplace. The final delta-8 THC product may have potentially harmful by-products (contaminants) due to the chemicals used in the process. Manufacturing of delta-8 THC products may occur in uncontrolled or unsanitary settings, which may lead to the presence of unsafe contaminants or other potentially harmful substances. Delta-8 THC products should be kept out of the reach of children and pets.  MORE ABOUT CBD  General Information: CBD was discovered in [birth year] and it is a derivative of the cannabis sativa genus plants (Marijuana and Hemp). It is one of the 113 identified substances found in Marijuana. It accounts for up to 40% of the plant's extract. As of 2018, preliminary clinical studies on CBD included research for the treatment of anxiety, movement disorders, and pain. CBD is available and consumed in multiple forms, including inhalation of smoke or vapor, as an aerosol spray, and by mouth. It may be supplied as an oil containing CBD, capsules, dried cannabis, or as a liquid solution. CBD is thought not to be as psychoactive as THC (delta-9-tetrahydrocannabinol - the chemical in marijuana responsible for the "HIGH"). Studies suggest that CBD may interact with different biological target receptors in the body, including cannabinoid and other neurotransmitter receptors. As of 2018 the mechanism of action for its  biological effects has not been determined.  Side-effects  Adverse reactions: Dry mouth, diarrhea, decreased appetite, fatigue, drowsiness, malaise, weakness, sleep disturbances, and others.  Drug interactions: CBC may interact with other medications such as blood-thinners. Because CBD causes drowsiness on its own, it also increases the drowsiness caused by other medications, including antihistamines (such as Benadryl), benzodiazepines (Xanax, Ativan, Valium), antipsychotics, antidepressants and opioids, as well as alcohol and supplements such as kava, melatonin and St. John's Wort. Be cautious with the following combinations:   Brivaracetam (Briviact) Brivaracetam is changed and broken down by the body. CBD might decrease how quickly the body breaks down brivaracetam. This might increase levels of brivaracetam in the body.  Caffeine Caffeine is changed and broken down by the body. CBD might decrease how quickly the body breaks down caffeine. This might increase levels of caffeine in the body.  Carbamazepine (Tegretol) Carbamazepine is changed and broken down by the body. CBD might decrease how quickly the body breaks down carbamazepine. This might increase levels of carbamazepine in the body and increase its side effects.  Citalopram (Celexa) Citalopram is changed and broken down by the body. CBD might decrease how quickly the body breaks down citalopram. This might increase levels of citalopram in the body and increase its side effects.  Clobazam (Onfi) Clobazam is changed and broken down by the liver. CBD might decrease how quickly the liver breaks down clobazam. This might increase the effects and side effects of clobazam.  Eslicarbazepine (Aptiom) Eslicarbazepine is changed and broken down by the body. CBD might decrease how quickly the body breaks down eslicarbazepine. This might increase levels of eslicarbazepine in the body by a small amount.  Everolimus (Zostress) Everolimus is  changed and broken down by the body. CBD might decrease how quickly the body breaks down everolimus. This might increase levels of everolimus in the body.  Lithium Taking higher doses of CBD might increase levels of lithium. This can increase the risk of lithium toxicity.  Medications changed by the liver (Cytochrome P450 1A1 (CYP1A1) substrates) Some medications are changed and broken down by the liver. CBD might change how quickly the liver breaks down these medications. This could change the effects and side effects of these medications.  Medications changed by  the liver (Cytochrome P450 1A2 (CYP1A2) substrates) Some medications are changed and broken down by the liver. CBD might change how quickly the liver breaks down these medications. This could change the effects and side effects of these medications.  Medications changed by the liver (Cytochrome P450 1B1 (CYP1B1) substrates) Some medications are changed and broken down by the liver. CBD might change how quickly the liver breaks down these medications. This could change the effects and side effects of these medications.  Medications changed by the liver (Cytochrome P450 2A6 (CYP2A6) substrates) Some medications are changed and broken down by the liver. CBD might change how quickly the liver breaks down these medications. This could change the effects and side effects of these medications.  Medications changed by the liver (Cytochrome P450 2B6 (CYP2B6) substrates) Some medications are changed and broken down by the liver. CBD might change how quickly the liver breaks down these medications. This could change the effects and side effects of these medications.  Medications changed by the liver (Cytochrome P450 2C19 (CYP2C19) substrates) Some medications are changed and broken down by the liver. CBD might change how quickly the liver breaks down these medications. This could change the effects and side effects of these  medications.  Medications changed by the liver (Cytochrome P450 2C8 (CYP2C8) substrates) Some medications are changed and broken down by the liver. CBD might change how quickly the liver breaks down these medications. This could change the effects and side effects of these medications.  Medications changed by the liver (Cytochrome P450 2C9 (CYP2C9) substrates) Some medications are changed and broken down by the liver. CBD might change how quickly the liver breaks down these medications. This could change the effects and side effects of these medications.  Medications changed by the liver (Cytochrome P450 2D6 (CYP2D6) substrates) Some medications are changed and broken down by the liver. CBD might change how quickly the liver breaks down these medications. This could change the effects and side effects of these medications.  Medications changed by the liver (Cytochrome P450 2E1 (CYP2E1) substrates) Some medications are changed and broken down by the liver. CBD might change how quickly the liver breaks down these medications. This could change the effects and side effects of these medications.  Medications changed by the liver (Cytochrome P450 3A4 (CYP3A4) substrates) Some medications are changed and broken down by the liver. CBD might change how quickly the liver breaks down these medications. This could change the effects and side effects of these medications.  Medications changed by the liver (Glucuronidated drugs) Some medications are changed and broken down by the liver. CBD might change how quickly the liver breaks down these medications. This could change the effects and side effects of these medications.  Medications that decrease the breakdown of other medications by the liver (Cytochrome P450 2C19 (CYP2C19) inhibitors) CBD is changed and broken down by the liver. Some drugs decrease how quickly the liver changes and breaks down CBD. This could change the effects and side effects of  CBD.  Medications that decrease the breakdown of other medications in the liver (Cytochrome P450 3A4 (CYP3A4) inhibitors) CBD is changed and broken down by the liver. Some drugs decrease how quickly the liver changes and breaks down CBD. This could change the effects and side effects of CBD.  Medications that increase breakdown of other medications by the liver (Cytochrome P450 3A4 (CYP3A4) inducers) CBD is changed and broken down by the liver. Some drugs increase how quickly the liver changes and breaks down  CBD. This could change the effects and side effects of CBD.  Medications that increase the breakdown of other medications by the liver (Cytochrome P450 2C19 (CYP2C19) inducers) CBD is changed and broken down by the liver. Some drugs increase how quickly the liver changes and breaks down CBD. This could change the effects and side effects of CBD.  Methadone (Dolophine) Methadone is broken down by the liver. CBD might decrease how quickly the liver breaks down methadone. Taking cannabidiol along with methadone might increase the effects and side effects of methadone.  Rufinamide (Banzel) Rufinamide is changed and broken down by the body. CBD might decrease how quickly the body breaks down rufinamide. This might increase levels of rufinamide in the body by a small amount.  Sedative medications (CNS depressants) CBD might cause sleepiness and slowed breathing. Some medications, called sedatives, can also cause sleepiness and slowed breathing. Taking CBD with sedative medications might cause breathing problems and/or too much sleepiness.  Sirolimus (Rapamune) Sirolimus is changed and broken down by the body. CBD might decrease how quickly the body breaks down sirolimus. This might increase levels of sirolimus in the body.  Stiripentol (Diacomit) Stiripentol is changed and broken down by the body. CBD might decrease how quickly the body breaks down stiripentol. This might increase levels of  stiripentol in the body and increase its side effects.  Tacrolimus (Prograf) Tacrolimus is changed and broken down by the body. CBD might decrease how quickly the body breaks down tacrolimus. This might increase levels of tacrolimus in the body.  Tamoxifen (Soltamox) Tamoxifen is changed and broken down by the body. CBD might affect how quickly the body breaks down tamoxifen. This might affect levels of tamoxifen in the body.  Topiramate (Topamax) Topiramate is changed and broken down by the body. CBD might decrease how quickly the body breaks down topiramate. This might increase levels of topiramate in the body by a small amount.  Valproate Valproic acid can cause liver injury. Taking cannabidiol with valproic acid might increase the chance of liver injury. CBD and/or valproic acid might need to be stopped, or the dose might need to be reduced.  Warfarin (Coumadin) CBD might increase levels of warfarin, which can increase the risk for bleeding. CBD and/or warfarin might need to be stopped, or the dose might need to be reduced.  Zonisamide Zonisamide is changed and broken down by the body. CBD might decrease how quickly the body breaks down zonisamide. This might increase levels of zonisamide in the body by a small amount. (Last update: 11/10/2021) ____________________________________________________________________________________________   ____________________________________________________________________________________________  Naloxone Nasal Spray  Why am I receiving this medication? [place] STOP ACT requires that all patients taking high dose opioids or at risk of opioids respiratory depression, be prescribed an opioid reversal agent, such as Naloxone (AKA: Narcan).  What is this medication? NALOXONE (nal OX one) treats opioid overdose, which causes slow or shallow breathing, severe drowsiness, or trouble staying awake. Call emergency services after using this medication.  You may need additional treatment. Naloxone works by reversing the effects of opioids. It belongs to a group of medications called opioid blockers.  COMMON BRAND NAME(S): Kloxxado, Narcan  What should I tell my care team before I take this medication? They need to know if you have any of these conditions: Heart disease Substance use disorder An unusual or allergic reaction to naloxone, other medications, foods, dyes, or preservatives Pregnant or trying to get pregnant Breast-feeding  When to use this medication? This medication is to  be used for the treatment of respiratory depression (less than 8 breaths per minute) secondary to opioid overdose.   How to use this medication? This medication is for use in the nose. Lay the person on their back. Support their neck with your hand and allow the head to tilt back before giving the medication. The nasal spray should be given into 1 nostril. After giving the medication, move the person onto their side. Do not remove or test the nasal spray until ready to use. Get emergency medical help right away after giving the first dose of this medication, even if the person wakes up. You should be familiar with how to recognize the signs and symptoms of a narcotic overdose. If more doses are needed, give the additional dose in the other nostril. Talk to your care team about the use of this medication in children. While this medication may be prescribed for children as young as newborns for selected conditions, precautions do apply.  Naloxone Overdosage: If you think you have taken too much of this medicine contact a poison control center or emergency room at once.  NOTE: This medicine is only for you. Do not share this medicine with others.  What if I miss a dose? This does not apply.  What may interact with this medication? This is only used during an emergency. No interactions are expected during emergency use. This list may not describe all possible  interactions. Give your health care provider a list of all the medicines, herbs, non-prescription drugs, or dietary supplements you use. Also tell them if you smoke, drink alcohol, or use illegal drugs. Some items may interact with your medicine.  What should I watch for while using this medication? Keep this medication ready for use in the case of an opioid overdose. Make sure that you have the phone number of your care team and local hospital ready. You may need to have additional doses of this medication. Each nasal spray contains a single dose. Some emergencies may require additional doses. After use, bring the treated person to the nearest hospital or call 911. Make sure the treating care team knows that the person has received a dose of this medication. You will receive additional instructions on what to do during and after use of this medication before an emergency occurs.  What side effects may I notice from receiving this medication? Side effects that you should report to your care team as soon as possible: Allergic reactions--skin rash, itching, hives, swelling of the face, lips, tongue, or throat Side effects that usually do not require medical attention (report these to your care team if they continue or are bothersome): Constipation Dryness or irritation inside the nose Headache Increase in blood pressure Muscle spasms Stuffy nose Toothache This list may not describe all possible side effects. Call your doctor for medical advice about side effects. You may report side effects to FDA at 1-800-FDA-1088.  Where should I keep my medication? Because this is an emergency medication, you should keep it with you at all times.  Keep out of the reach of children and pets. Store between 20 and 25 degrees C (68 and 77 degrees F). Do not freeze. Throw away any unused medication after the expiration date. Keep in original box until ready to use.  NOTE: This sheet is a summary. It may not cover  all possible information. If you have questions about this medicine, talk to your doctor, pharmacist, or health care provider.   2023 Elsevier/Gold Standard (  2021-05-07 00:00:00)  ____________________________________________________________________________________________   

## 2022-08-08 ENCOUNTER — Ambulatory Visit (HOSPITAL_BASED_OUTPATIENT_CLINIC_OR_DEPARTMENT_OTHER): Payer: Medicare Other | Admitting: Pain Medicine

## 2022-08-08 DIAGNOSIS — M5459 Other low back pain: Secondary | ICD-10-CM

## 2022-08-08 DIAGNOSIS — M47812 Spondylosis without myelopathy or radiculopathy, cervical region: Secondary | ICD-10-CM

## 2022-08-08 DIAGNOSIS — M797 Fibromyalgia: Secondary | ICD-10-CM

## 2022-08-08 DIAGNOSIS — G894 Chronic pain syndrome: Secondary | ICD-10-CM

## 2022-08-08 DIAGNOSIS — Z79899 Other long term (current) drug therapy: Secondary | ICD-10-CM

## 2022-08-08 DIAGNOSIS — G8929 Other chronic pain: Secondary | ICD-10-CM

## 2022-08-08 DIAGNOSIS — Z79891 Long term (current) use of opiate analgesic: Secondary | ICD-10-CM

## 2022-08-08 DIAGNOSIS — Z91199 Patient's noncompliance with other medical treatment and regimen due to unspecified reason: Secondary | ICD-10-CM

## 2022-08-08 DIAGNOSIS — M545 Low back pain, unspecified: Secondary | ICD-10-CM

## 2022-08-21 NOTE — Progress Notes (Signed)
PROVIDER NOTE: Information contained herein reflects review and annotations entered in association with encounter. Interpretation of such information and data should be left to medically-trained personnel. Information provided to patient can be located elsewhere in the medical record under "Patient Instructions". Document created using STT-dictation technology, any transcriptional errors that may result from process are unintentional.    Patient: Zoe Sanchez  Service Category: E/M  Provider: Gaspar Cola, MD  DOB: October 09, 1955  DOS: 08/22/2022  Referring Provider: Denton Lank, MD  MRN: [id number]  Specialty: Interventional Pain Management  PCP: Denton Lank, MD  Type: Established Patient  Setting: Ambulatory outpatient    Location: Office  Delivery: Face-to-face     HPI  Zoe Sanchez, a 66 y.o. year old female, is here today because of her Chronic pain syndrome [G89.4]. Zoe Sanchez primary complain today is No chief complaint on file. Last encounter: My last encounter with her was on 08/08/2022. Pertinent problems: Zoe Sanchez has Myofascial pain syndrome; Cervical facet syndrome (Bilateral) (R>L); Chronic neck pain (4th area of Pain) (Bilateral) (R>L); Chronic pain syndrome; Cervical spondylosis; Fibromyalgia; Cervical herniated disc (C5-6 and C6-7); Cervical foraminal stenosis (Bilateral) (C5-6); Chronic cervical radicular pain (Bilateral) (R>L) (C8 Dermatome); Chronic low back pain (Bilateral) (L>R) w/ sciatica (Bilateral) (L>R); Spondylosis of lumbar spine; Family history of chronic pain; Carpal tunnel syndrome (Left); Chronic shoulder pain (Bilateral); Cervical facet arthropathy; Lumbar facet arthropathy; Bimalleolar fracture of left ankle; Lumbar facet syndrome (Bilateral) (R>L); Chronic upper extremity pain (5th area of Pain) (Bilateral) (R>L); Chronic lower extremity pain (2ry area of Pain) (Bilateral) (L>R); Chronic lumbar radicular pain (Bilateral) (S1); Arthropathy of lumbar  facet joint; Cervical syndrome; Lumbar spondylosis; Synovial cyst of lumbar spine; Prolapsed cervical intervertebral disc; Lumbar facet joint pain; Cervical radiculopathy; Stenosis of intervertebral foramina; Chronic hip pain (3ry area of Pain) (Bilateral); Shoulder pain; Chronic musculoskeletal pain; Neurogenic pain; Closed trimalleolar fracture of left ankle; Bilateral lower extremity edema; Chronic pain of multiple sites; Closed fracture of fifth metatarsal bone; Lumbosacral sensory radiculopathy at S1 (Bilateral); Decreased range of motion of lumbar spine; Numbness and tingling of lower extremity (Bilateral); Chronic low back pain (1ry area of Pain) (Bilateral) w/o sciatica; and Abnormal MRI, lumbar spine (06/05/2021) on their pertinent problem list. Pain Assessment: Severity of   is reported as a  /10. Location:    / . Onset:  . Quality:  . Timing:  . Modifying factor(s):  Marland Kitchen Vitals:  vitals were not taken for this visit.  BMI: Estimated body mass index is 29.18 kg/m as calculated from the following:   Height as of 05/18/22: _0  (1.626 m).   Weight as of 05/18/22: 170 lb (77.1 kg).  Reason for encounter: medication management. ***  R3/06/2023  Nonopioids transferred 08/24/2020: Magnesium, Flexeril, Lyrica, melatonin, vitamin B12, and vitamin D3.  Pharmacotherapy Assessment  Analgesic: Hydrocodone/APAP 5/325, 1 tab PO q 4 hrs (30 mg/day of hydrocodone)(1,950 mg/day of acetaminophen) MME/day: 30 mg/day.   Monitoring: [place] PMP: PDMP reviewed during this encounter.       Pharmacotherapy: No side-effects or adverse reactions reported. Compliance: No problems identified. Effectiveness: Clinically acceptable.  No notes on file  No results found for: "CBDTHCR" No results found for: "D8THCCBX" No results found for: "D9THCCBX"  UDS:  Summary  Date Value Ref Range Status  02/16/2022 Note  Final    Comment:    ==================================================================== [facility]  13 (MW) ==================================================================== Test  Result       Flag       Units  Drug Present and Declared for Prescription Verification   Hydrocodone                    6353         EXPECTED   ng/mg creat   Hydromorphone                  284          EXPECTED   ng/mg creat   Dihydrocodeine                 1529         EXPECTED   ng/mg creat   Norhydrocodone                 2081         EXPECTED   ng/mg creat    Sources of hydrocodone include scheduled prescription medications.    Hydromorphone, dihydrocodeine and norhydrocodone are expected    metabolites of hydrocodone. Hydromorphone and dihydrocodeine are    also available as scheduled prescription medications.  Drug Present not Declared for Prescription Verification   Oxazepam                       23           UNEXPECTED ng/mg creat    Oxazepam may be administered as a scheduled prescription medication;    it is also an expected metabolite of other benzodiazepine drugs,    including diazepam, chlordiazepoxide, prazepam, clorazepate,    halazepam, and temazepam.  ==================================================================== Test                      Result    Flag   Units      Ref Range   Creatinine              124              mg/dL      >=20 ==================================================================== Declared Medications:  The flagging and interpretation on this report are based on the  following declared medications.  Unexpected results may arise from  inaccuracies in the declared medications.   **Note: The testing scope of this panel includes these medications:   Hydrocodone (Norco)   **Note: The testing scope of this panel does not include the  following reported medications:   Acetaminophen (Norco)  Albuterol  Amitriptyline (Elavil)  Amoxicillin  Cholecalciferol  Cyanocobalamin  Cyclobenzaprine (Flexeril)  Dicyclomine (Bentyl)   Estradiol (Estrace)  Fluticasone (Flonase)  Magnesium (Magonate)  Melatonin  Metronidazole (Flagyl)  Montelukast  Omeprazole  Pregabalin (Lyrica)  Propranolol (Inderal)  Rosuvastatin (Crestor)  Salmeterol  Topical  Umeclidinium (Incruse Ellipta)  Zolpidem (Ambien) ==================================================================== For clinical consultation, please call [phone]. ====================================================================       ROS  Constitutional: Denies any fever or chills Gastrointestinal: No reported hemesis, hematochezia, vomiting, or acute GI distress Musculoskeletal: Denies any acute onset joint swelling, redness, loss of ROM, or weakness Neurological: No reported episodes of acute onset apraxia, aphasia, dysarthria, agnosia, amnesia, paralysis, loss of coordination, or loss of consciousness  Medication Review  HYDROcodone-acetaminophen, Magnesium Gluconate, albuterol, amitriptyline, aspirin EC, betamethasone dipropionate, cyclobenzaprine, dicyclomine, docusate sodium, estradiol, fluticasone, gemfibrozil, metoprolol tartrate, metroNIDAZOLE, montelukast, omeprazole, potassium chloride, pregabalin, propranolol ER, rosuvastatin, salmeterol, senna, umeclidinium bromide, and zolpidem  History Review  Allergy: Zoe Sanchez is allergic to prednisone. Drug: Ms.  Sanchez  reports no history of drug use. Alcohol:  reports no history of alcohol use. Tobacco:  reports that she has never smoked. She has never used smokeless tobacco. Social: Zoe Sanchez  reports that she has never smoked. She has never used smokeless tobacco. She reports that she does not drink alcohol and does not use drugs. Medical:  has a past medical history of Abnormal heart rhythm, Abnormal mammogram (02/25/2014), Actinic keratosis, Anemia, Anxiety, Asthma, Awareness of heartbeats (06/29/2015), Basal cell carcinoma (04/24/2008), Basal cell carcinoma (04/09/2009), Basal cell carcinoma  (BCC) (12/23/2014), Breath shortness (06/29/2015), CAD (coronary artery disease), Cancer (HCC), Cervical radicular pain (Location of Secondary source of pain) (Bilateral) (R>L) (C8 Dermatome) (06/29/2015), Chronic pain, COPD (chronic obstructive pulmonary disease) ([place]), DDD (degenerative disc disease), cervical, Degenerative disk disease, Depression, Dysrhythmia, Endometriosis, Family history of chronic pain (06/29/2015), Fibromyalgia, Fibromyalgia, GERD (gastroesophageal reflux disease), Heart murmur, Hiatal hernia, History of attempted suicide (06/29/2015), History of hiatal hernia (06/29/2015), History of psychiatric disorder (06/29/2015), History of suicidal ideation (06/29/2015), Hyperlipidemia, Insomnia, Insomnia, Irritable bowel syndrome, 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 Surgical: Zoe Sanchez  has a past surgical history that includes Abdominal hysterectomy (1986); Tonsillectomy (1957); Skin lesion excision; Oophorectomy (1986); Breast surgery; ORIF ankle fracture (Left, 03/14/2018); ORIF ankle fracture (Left, 07/26/2018); Ankle surgery; Colonoscopy with propofol (N/A, 06/05/2019); Breast biopsy (Right, 2016); Fracture surgery; and Colonoscopy with propofol (N/A, 03/05/2020). Family: family history includes Breast cancer in her maternal aunt; Heart disease in her father; Hypertension in her father and mother.  Laboratory Chemistry Profile   Renal Lab Results  Component Value Date   BUN 21 04/26/2019   CREATININE 0.84 04/26/2019   GFRAA >60 04/26/2019   GFRNONAA >60 04/26/2019    Hepatic Lab Results  Component Value Date   AST 35 04/26/2019   ALT 22 04/26/2019   ALBUMIN 4.1 04/26/2019   ALKPHOS 71 04/26/2019    Electrolytes Lab Results  Component Value Date   NA 135 04/26/2019   K 4.5 04/26/2019   CL 103 04/26/2019   CALCIUM 9.4  04/26/2019   MG 1.8 04/26/2019    Bone Lab Results  Component Value Date   VD25OH 40.8 04/26/2019   25OHVITD1 49 03/09/2016   25OHVITD2 <1.0 03/09/2016   25OHVITD3 48 03/09/2016    Inflammation (CRP: Acute Phase) (ESR: Chronic Phase) Lab Results  Component Value Date   CRP 2.2 (H) 04/26/2019   ESRSEDRATE 39 (H) 04/26/2019         Note: Above Lab results reviewed.  Recent Imaging Review  MR LUMBAR SPINE WO CONTRAST CLINICAL DATA:  Osteoarthritis, lumbosacral Low back pain, > 6 wks Lumbar radiculopathy, > 6 wks  EXAM: MRI LUMBAR SPINE WITHOUT CONTRAST  TECHNIQUE: Multiplanar, multisequence MR imaging of the lumbar spine was performed. No intravenous contrast was administered.  COMPARISON:  May 2018  FINDINGS: Segmentation:  Standard.  Alignment:  Levocurvature.  Anteroposterior alignment preserved.  Vertebrae: Stable vertebral body heights. No marrow edema. No suspicious osseous lesion.  Conus medullaris and cauda equina: Conus extends to the L1 level. Conus and cauda equina appear normal.  Paraspinal and other soft tissues: Unremarkable.  Disc levels:  L1-L2:  Trace disc bulge.  No canal or foraminal stenosis.  L2-L3: Trace disc bulge with superimposed small left foraminal protrusion. No canal or foraminal stenosis.  L3-L4: Trace disc bulge with superimposed small left foraminal protrusion. Mild facet arthropathy with  ligamentum flavum infolding. No canal stenosis. Minimal left subarticular recess narrowing. No right foraminal stenosis. Minimal left foraminal stenosis.  L4-L5: Moderate facet arthropathy with ligamentum flavum infolding. Facet synovial cyst is no longer present. No canal or foraminal stenosis.  L5-S1:  Moderate facet arthropathy.  No canal or foraminal stenosis.  IMPRESSION: Overall mild degenerative changes as detailed above without progression since 2018 examination. Lower lumbar facet arthropathy.  Electronically Signed   By:  Macy Mis M.D.   On: 06/05/2021 15:05 Note: Reviewed        Physical Exam  General appearance: Well nourished, well developed, and well hydrated. In no apparent acute distress Mental status: Alert, oriented x 3 (person, place, & time)       Respiratory: No evidence of acute respiratory distress Eyes: PERLA Vitals: There were no vitals taken for this visit. BMI: Estimated body mass index is 29.18 kg/m as calculated from the following:   Height as of 05/18/22: _0  (1.626 m).   Weight as of 05/18/22: 170 lb (77.1 kg). Ideal: Patient weight not recorded  Assessment   Diagnosis Status  1. Chronic pain syndrome   2. Chronic low back pain (1ry area of Pain) (Bilateral) w/o sciatica   3. Chronic lower extremity pain (2ry area of Pain) (Bilateral) (L>R)   4. Chronic hip pain (3ry area of Pain) (Bilateral)   5. Chronic neck pain (4th area of Pain) (Bilateral) (R>L)   6. Chronic upper extremity pain (5th area of Pain) (Bilateral) (R>L)   7. Fibromyalgia   8. Cervical facet syndrome (Bilateral) (R>L)   9. Lumbar facet joint pain   10. Pharmacologic therapy   11. Chronic use of opiate for therapeutic purpose   12. Encounter for medication management   13. Encounter for chronic pain management    Controlled Controlled Controlled   Updated Problems: No problems updated.  Plan of Care  Problem-specific:  No problem-specific Assessment & Plan notes found for this encounter.  Zoe Sanchez has a current medication list which includes the following long-term medication(s): albuterol, amitriptyline, dicyclomine, estradiol, fluticasone, hydrocodone-acetaminophen, metoprolol tartrate, metoprolol tartrate, montelukast, omeprazole, pregabalin, pregabalin, propranolol er, rosuvastatin, and salmeterol.  Pharmacotherapy (Medications Ordered): No orders of the defined types were placed in this encounter.  Orders:  No orders of the defined types were placed in this  encounter.  Follow-up plan:   No follow-ups on file.     Interventional Therapies  Risk  Complexity Considerations:   Estimated body mass index is 29.23 kg/m as calculated from the following:   Height as of this encounter: _1  (1.6 m).   Weight as of this encounter: 165 lb (74.8 kg). WNL   Planned  Pending:   Repeat x-rays of the lumbar spine with flexion and extension views.   Under consideration:   Diagnostic/therapeutic bilateral lumbar facet MBB #3  Diagnostic bilateral cervical facet MBB  Diagnostic right CESI  Diagnostic bilateral IA shoulder joint injection  Diagnostic bilateral suprascapular NB  Possible bilateral lumbar facet RFA  Diagnostic left carpal tunnel injection    Completed:   Diagnostic bilateral lumbar facet MBB x2 (w/o steroids) (04/20/2017) (100/60/60/<25)    Therapeutic  Palliative (PRN) options:   none   Pharmacotherapy:  Nonopioids transferred 08/24/2020: Magnesium, Flexeril, Lyrica, melatonin, vitamin B12, and vitamin D3. Recommendations:   None at this time.      Recent Visits Date Type Provider Dept  08/08/22 Office Visit Milinda Pointer, MD [facility]  Showing recent visits within past  90 days and meeting all other requirements Future Appointments Date Type Provider Dept  08/22/22 Appointment Milinda Pointer, MD [facility]  Showing future appointments within next 90 days and meeting all other requirements  I discussed the assessment and treatment plan with the patient. The patient was provided an opportunity to ask questions and all were answered. The patient agreed with the plan and demonstrated an understanding of the instructions.  Patient advised to call back or seek an in-person evaluation if the symptoms or condition worsens.  Duration of encounter: *** minutes.  Total time on encounter, as per AMA guidelines included both the face-to-face and non-face-to-face time personally spent by the physician  and/or other qualified health care professional(s) on the day of the encounter (includes time in activities that require the physician or other qualified health care professional and does not include time in activities normally performed by clinical staff). Physician's time may include the following activities when performed: preparing to see the patient (eg, review of tests, pre-charting review of records) obtaining and/or reviewing separately obtained history performing a medically appropriate examination and/or evaluation counseling and educating the patient/family/caregiver ordering medications, tests, or procedures referring and communicating with other health care professionals (when not separately reported) documenting clinical information in the electronic or other health record independently interpreting results (not separately reported) and communicating results to the patient/ family/caregiver care coordination (not separately reported)  Note by: Gaspar Cola, MD Date: 08/22/2022; Time: 3:45 PM

## 2022-08-21 NOTE — Patient Instructions (Signed)
____________________________________________________________________________________________  Patient Information update  To: All of our patients.  Re: Name change.  It has been made official that our current name, "[facility]"   will soon be changed to "[facility]".   The purpose of this change is to eliminate any confusion created by the concept of our practice being a "Medication Management Pain Clinic". In the past this has led to the misconception that we treat pain primarily by the use of prescription medications.  Nothing can be farther from the truth.   Understanding PAIN MANAGEMENT: To further understand what our practice does, you first have to understand that "Pain Management" is a subspecialty that requires additional training once a physician has completed their specialty training, which can be in either Anesthesia, Neurology, Psychiatry, or Physical Medicine and Rehabilitation (PMR). Each one of these contributes to the final approach taken by each physician to the management of their patient's pain. To be a "Pain Management Specialist" you must have first completed one of the specialty trainings below.  Anesthesiologists - trained in clinical pharmacology and interventional techniques such as nerve blockade and regional as well as central neuroanatomy. They are trained to block pain before, during, and after surgical interventions.  Neurologists - trained in the diagnosis and pharmacological treatment of complex neurological conditions, such as Multiple Sclerosis, Parkinson's, spinal cord injuries, and other systemic conditions that may be associated with symptoms that may include but are not limited to pain. They tend to rely primarily on the treatment of chronic pain using prescription medications.  Psychiatrist - trained in conditions affecting the psychosocial  wellbeing of patients including but not limited to depression, anxiety, schizophrenia, personality disorders, addiction, and other substance use disorders that may be associated with chronic pain. They tend to rely primarily on the treatment of chronic pain using prescription medications.   Physical Medicine and Rehabilitation (PMR) physicians, also known as physiatrists - trained to treat a wide variety of medical conditions affecting the brain, spinal cord, nerves, bones, joints, ligaments, muscles, and tendons. Their training is primarily aimed at treating patients that have suffered injuries that have caused severe physical impairment. Their training is primarily aimed at the physical therapy and rehabilitation of those patients. They may also work alongside orthopedic surgeons or neurosurgeons using their expertise in assisting surgical patients to recover after their surgeries.  INTERVENTIONAL PAIN MANAGEMENT is sub-subspecialty of Pain Management.  Our physicians are Board-certified in Anesthesia, Pain Management, and Interventional Pain Management.  This meaning that not only have they been trained and Board-certified in their specialty of Anesthesia, and subspecialty of Pain Management, but they have also received further training in the sub-subspecialty of Interventional Pain Management, in order to become Board-certified as INTERVENTIONAL PAIN MANAGEMENT SPECIALIST.    Mission: Our goal is to use our skills in  INTERVENTIONAL PAIN MANAGEMENT as alternatives to the chronic use of prescription opioid medications for the treatment of pain. To make this more clear, we have changed our name to reflect what we do and offer. We will continue to offer medication management assessment and recommendations, but we will not be taking over any patient's medication management.  ____________________________________________________________________________________________      ____________________________________________________________________________________________  National Pain Medication Shortage  The [place] is experiencing worsening drug shortages. These have had a negative widespread effect on patient care and treatment. Not expected to improve any time soon. Predicted to last past 2029.   Drug shortage list (generic   names) Oxycodone IR Oxycodone/APAP Oxymorphone IR Hydromorphone Hydrocodone/APAP Morphine  Where is the problem?  Manufacturing and supply level.  Will this shortage affect you?  Only if you take any of the above pain medications.  How? You may be unable to fill your prescription.  Your pharmacist may offer a "partial fill" of your prescription. (Warning: Do not accept partial fills.) Read our Medication Rules and Regulation. Depending on how much medicine you are dependent on, you may experience withdrawals when unable to get the medication.  Recommendations: Consider ending your dependence on opioid pain medications. Ask your pain specialist to assist you with the process. Consider switching to a medication currently not in shortage, such as Buprenorphine. Talk to your pain specialist about this option. Consider decreasing your pain medication requirements by managing tolerance thru "Drug Holidays". This may help minimize withdrawals, should you run out of medicine. Control your pain thru the use of non-pharmacological interventional therapies.   Your prescriber: Prescribers cannot be blamed for shortages. Medication manufacturing and supply issues cannot be fixed by the prescriber.   NOTE: The prescriber is not responsible for supplying the medication, or solving supply issues. Work with your pharmacist to solve it. The patient is responsible for the decision to take or continue taking the medication and for identifying and securing a legal supply source. By law, supplying the medication is the job and responsibility of the  pharmacy. The prescriber is responsible for the evaluation, monitoring, and prescribing of these medications.   Prescribers will NOT: Re-issue prescriptions that have been partially filled. Re-issue prescriptions already sent to a pharmacy.  Re-send prescriptions to a different pharmacy because yours did not have your medication. Ask pharmacist to order the medicine or transfer it from one of their other pharmacies.  New 2023 regulation: "May 13, 2022 Revised Regulation Allows DEA-Registered Pharmacies to Transfer Electronic Prescriptions at a Patient's Request [place] Patients now have the ability to request their electronic prescription be transferred to another pharmacy without having to go back to their practitioner to initiate the request. This revised regulation went into effect on Monday, May 09, 2022.     At a patient's request, a DEA-registered retail pharmacy can now transfer an electronic prescription for a controlled substance (schedules II-V) to another DEA-registered retail pharmacy. Prior to this change, patients would have to go through their practitioner to cancel their prescription and have it re-issued to a different pharmacy. The process was taxing and time consuming for both patients and practitioners.    The Drug Enforcement Administration [facility]) published its intent to revise the process for transferring electronic prescriptions on July 31, 2020.  The final rule was published in the federal register on April 07, 2022 and went into effect 30 days later.  Under the final rule, a prescription can only be transferred once between pharmacies, and only if allowed under existing state or other applicable law. The prescription must remain in its electronic form; may not be altered in any way; and the transfer must be communicated directly between two licensed pharmacists. It's important to note, any authorized refills transfer  with the original prescription, which means the entire prescription will be filled at the same pharmacy".   [email or web address]  ____________________________________________________________________________________________     _______________________________________________________________________  Medication Rules  Purpose: To inform patients, and their family members, of our medication rules and regulations.  Applies to: All patients receiving prescriptions from our practice (written or electronic).  Pharmacy of record: This is  the pharmacy where your electronic prescriptions will be sent. Make sure we have the correct one.  Electronic prescriptions: In compliance with the [place] (STOP) Act of 2017 (Session Lanny Cramp [phone]), effective September 12, 2018, all controlled substances must be electronically prescribed. Written prescriptions, faxing, or calling prescriptions to a pharmacy will no longer be done.  Prescription refills: These will be provided only during in-person appointments. No medications will be renewed without a "face-to-face" evaluation with your provider. Applies to all prescriptions.  NOTE: The following applies primarily to controlled substances (Opioid* Pain Medications).   Type of encounter (visit): For patients receiving controlled substances, face-to-face visits are required. (Not an option and not up to the patient.)  Patient's responsibilities: Pain Pills: Bring all pain pills to every appointment (except for procedure appointments). Pill Bottles: Bring pills in original pharmacy bottle. Bring bottle, even if empty. Always bring the bottle of the most recent fill.  Medication refills: You are responsible for knowing and keeping track of what medications you are taking and when is it that you will need a refill. The day before  your appointment: write a list of all prescriptions that need to be refilled. The day of the appointment: give the list to the admitting nurse. Prescriptions will be written only during appointments. No prescriptions will be written on procedure days. If you forget a medication: it will not be "Called in", "Faxed", or "electronically sent". You will need to get another appointment to get these prescribed. No early refills. Do not call asking to have your prescription filled early. Partial  or short prescriptions: Occasionally your pharmacy may not have enough pills to fill your prescription.  NEVER ACCEPT a partial fill or a prescription that is short of the total amount of pills that you were prescribed.  With controlled substances the law allows 72 hours for the pharmacy to complete the prescription.  If the prescription is not completed within 72 hours, the pharmacist will require a new prescription to be written. This means that you will be short on your medicine and we WILL NOT send another prescription to complete your original prescription.  Instead, request the pharmacy to send a carrier to a nearby branch to get enough medication to provide you with your full prescription. Prescription Accuracy: You are responsible for carefully inspecting your prescriptions before leaving our office. Have the discharge nurse carefully go over each prescription with you, before taking them home. Make sure that your name is accurately spelled, that your address is correct. Check the name and dose of your medication to make sure it is accurate. Check the number of pills, and the written instructions to make sure they are clear and accurate. Make sure that you are given enough medication to last until your next medication refill appointment. Taking Medication: Take medication as prescribed. When it comes to controlled substances, taking less pills or less frequently than prescribed is permitted and encouraged. Never  take more pills than instructed. Never take the medication more frequently than prescribed.  Inform other Doctors: Always inform, all of your healthcare providers, of all the medications you take. Pain Medication from other Providers: You are not allowed to accept any additional pain medication from any other Doctor or Healthcare provider. There are two exceptions to this rule. (see below) In the event that you require additional pain medication, you are responsible for notifying us, as stated below. Cough Medicine: Often these contain an opioid, such as codeine or hydrocodone. Never  accept or take cough medicine containing these opioids if you are already taking an opioid* medication. The combination may cause respiratory failure and death. Medication Agreement: You are responsible for carefully reading and following our Medication Agreement. This must be signed before receiving any prescriptions from our practice. Safely store a copy of your signed Agreement. Violations to the Agreement will result in no further prescriptions. (Additional copies of our Medication Agreement are available upon request.) Laws, Rules, & Regulations: All patients are expected to follow all Federal and [employer], Rules, [employer]. Ignorance of the Laws does not constitute a valid excuse.  Illegal drugs and Controlled Substances: The use of illegal substances (including, but not limited to marijuana and its derivatives) and/or the illegal use of any controlled substances is strictly prohibited. Violation of this rule may result in the immediate and permanent discontinuation of any and all prescriptions being written by our practice. The use of any illegal substances is prohibited. Adopted CDC guidelines & recommendations: Target dosing levels will be at or below 60 MME/day. Use of benzodiazepines** is not recommended.  Exceptions: There are only two exceptions to the rule of not receiving pain medications from  other Healthcare Providers. Exception #1 (Emergencies): In the event of an emergency (i.e.: accident requiring emergency care), you are allowed to receive additional pain medication. However, you are responsible for: As soon as you are able, call our office [phone], at any time of the day or night, and leave a message stating your name, the date and nature of the emergency, and the name and dose of the medication prescribed. In the event that your call is answered by a member of our staff, make sure to document and save the date, time, and the name of the person that took your information.  Exception #2 (Planned Surgery): In the event that you are scheduled by another doctor or dentist to have any type of surgery or procedure, you are allowed (for a period no longer than 30 days), to receive additional pain medication, for the acute post-op pain. However, in this case, you are responsible for picking up a copy of our "Post-op Pain Management for Surgeons" handout, and giving it to your surgeon or dentist. This document is available at our office, and does not require an appointment to obtain it. Simply go to our office during business hours (Monday-Thursday from 8:00 AM to 4:00 PM) (Friday 8:00 AM to 12:00 Noon) or if you have a scheduled appointment with [place], prior to your surgery, and ask for it by name. In addition, you are responsible for: calling our office [phone], at any time of the day or night, and leaving a message stating your name, name of your surgeon, type of surgery, and date of procedure or surgery. Failure to comply with your responsibilities may result in termination of therapy involving the controlled substances. Medication Agreement Violation. Following the above rules, including your responsibilities will help you in avoiding a Medication Agreement Violation ("Breaking your Pain Medication Contract").  Consequences:  Not following the above rules may result in permanent  discontinuation of medication prescription therapy.  *Opioid medications include: morphine, codeine, oxycodone, oxymorphone, hydrocodone, hydromorphone, meperidine, tramadol, tapentadol, buprenorphine, fentanyl, methadone. **Benzodiazepine medications include: diazepam (Valium), alprazolam (Xanax), clonazepam (Klonopine), lorazepam (Ativan), clorazepate (Tranxene), chlordiazepoxide (Librium), estazolam (Prosom), oxazepam (Serax), temazepam (Restoril), triazolam (Halcion) (Last updated: 07/05/2022) ______________________________________________________________________    ______________________________________________________________________  Medication Recommendations and Reminders  Applies to: All patients receiving prescriptions (written and/or electronic).  Medication  Rules & Regulations: You are responsible for reading, knowing, and following our "Medication Rules" document. These exist for your safety and that of others. They are not flexible and neither are we. Dismissing or ignoring them is an act of "non-compliance" that may result in complete and irreversible termination of such medication therapy. For safety reasons, "non-compliance" will not be tolerated. As with the [place] fundamental legal principle of "ignorance of the law is no defense", we will accept no excuses for not having read and knowing the content of documents provided to you by our practice.  Pharmacy of record:  Definition: This is the pharmacy where your electronic prescriptions will be sent.  We do not endorse any particular pharmacy. It is up to you and your insurance to decide what pharmacy to use.  We do not restrict you in your choice of pharmacy. However, once we write for your prescriptions, we will NOT be re-sending more prescriptions to fix restricted supply problems created by your pharmacy, or your insurance.  The pharmacy listed in the electronic medical record should be the one where you want electronic  prescriptions to be sent. If you choose to change pharmacy, simply notify our nursing staff. Changes will be made only during your regular appointments and not over the phone.  Recommendations: Keep all of your pain medications in a safe place, under lock and key, even if you live alone. We will NOT replace lost, stolen, or damaged medication. We do not accept "Police Reports" as proof of medications having been stolen. After you fill your prescription, take 1 week's worth of pills and put them away in a safe place. You should keep a separate, properly labeled bottle for this purpose. The remainder should be kept in the original bottle. Use this as your primary supply, until it runs out. Once it's gone, then you know that you have 1 week's worth of medicine, and it is time to come in for a prescription refill. If you do this correctly, it is unlikely that you will ever run out of medicine. To make sure that the above recommendation works, it is very important that you make sure your medication refill appointments are scheduled at least 1 week before you run out of medicine. To do this in an effective manner, make sure that you do not leave the office without scheduling your next medication management appointment. Always ask the nursing staff to show you in your prescription , when your medication will be running out. Then arrange for the receptionist to get you a return appointment, at least 7 days before you run out of medicine. Do not wait until you have 1 or 2 pills left, to come in. This is very poor planning and does not take into consideration that we may need to cancel appointments due to bad weather, sickness, or emergencies affecting our staff. DO NOT ACCEPT A "Partial Fill": If for any reason your pharmacy does not have enough pills/tablets to completely fill or refill your prescription, do not allow for a "partial fill". The law allows the pharmacy to complete that prescription within 72 hours,  without requiring a new prescription. If they do not fill the rest of your prescription within those 72 hours, you will need a separate prescription to fill the remaining amount, which we will NOT provide. If the reason for the partial fill is your insurance, you will need to talk to the pharmacist about payment alternatives for the remaining tablets, but again, DO NOT ACCEPT A  PARTIAL FILL, unless you can trust your pharmacist to obtain the remainder of the pills within 72 hours.  Prescription refills and/or changes in medication(s):  Prescription refills, and/or changes in dose or medication, will be conducted only during scheduled medication management appointments. (Applies to both, written and electronic prescriptions.) No refills on procedure days. No medication will be changed or started on procedure days. No changes, adjustments, and/or refills will be conducted on a procedure day. Doing so will interfere with the diagnostic portion of the procedure. No phone refills. No medications will be "called into the pharmacy". No Fax refills. No weekend refills. No Holliday refills. No after hours refills.  Remember:  Business hours are:  Monday to Thursday 8:00 AM to 4:00 PM Provider's Schedule: Milinda Pointer, MD - Appointments are:  Medication management: Monday and Wednesday 8:00 AM to 4:00 PM Procedure day: Tuesday and Thursday 7:30 AM to 4:00 PM Gillis Santa, MD - Appointments are:  Medication management: Tuesday and Thursday 8:00 AM to 4:00 PM Procedure day: Monday and Wednesday 7:30 AM to 4:00 PM (Last update: 07/05/2022) ______________________________________________________________________    ____________________________________________________________________________________________  Drug Holidays  What is a "Drug Holiday"? Drug Holiday: is the name given to the process of slowly tapering down and temporarily stopping the pain medication for the purpose of decreasing or  eliminating tolerance to the drug.  Benefits Improved effectiveness Decreased required effective dose Improved pain control End dependence on high dose therapy Decrease cost of therapy Uncovering "opioid-induced hyperalgesia". (OIH)  What is "opioid hyperalgesia"? It is a paradoxical increase in pain caused by exposure to opioids. Stopping the opioid pain medication, contrary to the expected, it actually decreases or completely eliminates the pain. Ref.: "A comprehensive review of opioid-induced hyperalgesia". Brion Aliment, et.al. Pain Physician. 2011 Mar-Apr;14(2):145-61.  What is tolerance? Tolerance: the progressive loss of effectiveness of a pain medicine due to repetitive use. A common problem of opioid pain medications.  How long should a "Drug Holiday" last? Effectiveness depends on the patient staying off all opioid pain medicines for a minimum of 14 consecutive days. (2 weeks)  How about just taking less of the medicine? Does not work. Will not accomplish goal of eliminating the excess receptors.  How about switching to a different pain medicine? (AKA. "Opioid rotation") Does not work. Creates the illusion of effectiveness by taking advantage of inaccurate equivalent dose calculations between different opioids. -This "technique" was promoted by studies funded by [employer], such as [employer], creators of "OxyContin".  Can I stop the medicine "cold [place]"? Depends. You should always coordinate with your Pain Specialist to make the transition as smoothly as possible. Avoid stopping the medicine abruptly without consulting. We recommend a "slow taper".  What is a slow taper? Taper: refers to the gradual decrease in dose.   How do I stop/taper the dose? Slowly. Decrease the daily amount of pills that you take by one (1) pill every seven (7) days. This is called a "slow downward taper". Example: if you normally take four (4) pills per day, drop it to three (3)  pills per day for seven (7) days, then to two (2) pills per day for seven (7) days, then to one (1) per day for seven (7) days, and then stop the medicine. The 14 day "Drug Holiday" starts on the first day without medicine.   Will I experience withdrawals? Unlikely with a slow taper.  What triggers withdrawals? Withdrawals are triggered by the sudden/abrupt stop of high dose opioids. Withdrawals can be  avoided by slowly decreasing the dose over a prolonged period of time.  What are withdrawals? Symptoms associated with sudden/abrupt reduction/stopping of high-dose, long-term use of pain medication. Withdrawal are seldom seen on low dose therapy, or patients rarely taking opioid medication.  Early Withdrawal Symptoms may include: Agitation Anxiety Muscle aches Increased tearing Insomnia Runny nose Sweating Yawning  Late symptoms may include: Abdominal cramping Diarrhea Dilated pupils Goose bumps Nausea Vomiting  (Last update: 08/21/2022) ____________________________________________________________________________________________    ____________________________________________________________________________________________  CBD (cannabidiol) & Delta-8 (Delta-8 tetrahydrocannabinol) WARNING  Intro: Cannabidiol (CBD) and tetrahydrocannabinol (THC), are two natural compounds found in plants of the Cannabis genus. They can both be extracted from hemp or cannabis. Hemp and cannabis come from the Cannabis sativa plant. Both compounds interact with your body's endocannabinoid system, but they have very different effects. CBD does not produce the high sensation associated with cannabis. Delta-8 tetrahydrocannabinol, also known as delta-8 THC, is a psychoactive substance found in the Cannabis sativa plant, of which marijuana and hemp are two varieties. THC is responsible for the high associated with the illicit use of marijuana.  Applicable to: All individuals currently taking or  considering taking CBD (cannabidiol) and, more important, all patients taking opioid analgesic controlled substances (pain medication). (Example: oxycodone; oxymorphone; hydrocodone; hydromorphone; morphine; methadone; tramadol; tapentadol; fentanyl; buprenorphine; butorphanol; dextromethorphan; meperidine; codeine; etc.)  Legal status: CBD remains a Schedule I drug prohibited for any use. CBD is illegal with one exception. In the [place], CBD has a limited Transport planner (FDA) approval for the treatment of two specific types of epilepsy disorders. Only one CBD product has been approved by the FDA for this purpose: "Epidiolex". FDA is aware that some companies are marketing products containing cannabis and cannabis-derived compounds in ways that violate the [employer], Drug and Cosmetic Act [facility] Act) and that may put the health and safety of consumers at risk. The FDA, a Federal agency, has not enforced the CBD status since 2018. UPDATE: (10/29/2021) The Drug Enforcement Agency ([place]) issued a letter stating that "delta" cannabinoids, including Delta-8-THCO and Delta-9-THCO, synthetically derived from hemp do not qualify as hemp and will be viewed as Schedule I drugs. (Schedule I drugs, substances, or chemicals are defined as drugs with no currently accepted medical use and a high potential for abuse. Some examples of Schedule I drugs are: heroin, lysergic acid diethylamide (LSD), marijuana (cannabis), 3,4-methylenedioxymethamphetamine (ecstasy), methaqualone, and peyote.) (https://jennings.com/)  Legality: Some manufacturers ship CBD products nationally, which is illegal. Often such products are sold online and are therefore available throughout the country. CBD is openly sold in head shops and health food stores in some states where such sales have not been explicitly legalized. Selling unapproved products with unsubstantiated therapeutic claims is not only a violation of the law, but also  can put patients at risk, as these products have not been proven to be safe or effective. Federal illegality makes it difficult to conduct research on CBD.  Reference: "FDA Regulation of Cannabis and Cannabis-Derived Products, Including Cannabidiol (CBD)" - SeekArtists.[employer].pt  Warning: CBD is not FDA approved and has not undergo the same manufacturing controls as prescription drugs.  This means that the purity and safety of available CBD may be questionable. Most of the time, despite manufacturer's claims, it is contaminated with THC (delta-9-tetrahydrocannabinol - the chemical in marijuana responsible for the "HIGH").  When this is the case, the [facility] contaminant will trigger a positive urine drug screen (UDS) test for Marijuana (carboxy-THC). Because a positive UDS for  any illicit substance is a violation of our medication agreement, your opioid analgesics (pain medicine) may be permanently discontinued. The FDA recently put out a warning about 5 things that everyone should be aware of regarding Delta-8 THC: Delta-8 THC products have not been evaluated or approved by the FDA for safe use and may be marketed in ways that put the public health at risk. The FDA has received adverse event reports involving delta-8 THC-containing products. Delta-8 THC has psychoactive and intoxicating effects. Delta-8 THC manufacturing often involve use of potentially harmful chemicals to create the concentrations of delta-8 THC claimed in the marketplace. The final delta-8 THC product may have potentially harmful by-products (contaminants) due to the chemicals used in the process. Manufacturing of delta-8 THC products may occur in uncontrolled or unsanitary settings, which may lead to the presence of unsafe contaminants or other potentially harmful substances. Delta-8 THC products should be kept out of the reach of  children and pets.  MORE ABOUT CBD  General Information: CBD was discovered in [birth year] and it is a derivative of the cannabis sativa genus plants (Marijuana and Hemp). It is one of the 113 identified substances found in Marijuana. It accounts for up to 40% of the plant's extract. As of 2018, preliminary clinical studies on CBD included research for the treatment of anxiety, movement disorders, and pain. CBD is available and consumed in multiple forms, including inhalation of smoke or vapor, as an aerosol spray, and by mouth. It may be supplied as an oil containing CBD, capsules, dried cannabis, or as a liquid solution. CBD is thought not to be as psychoactive as THC (delta-9-tetrahydrocannabinol - the chemical in marijuana responsible for the "HIGH"). Studies suggest that CBD may interact with different biological target receptors in the body, including cannabinoid and other neurotransmitter receptors. As of 2018 the mechanism of action for its biological effects has not been determined.  Side-effects  Adverse reactions: Dry mouth, diarrhea, decreased appetite, fatigue, drowsiness, malaise, weakness, sleep disturbances, and others.  Drug interactions: CBC may interact with other medications such as blood-thinners. Because CBD causes drowsiness on its own, it also increases the drowsiness caused by other medications, including antihistamines (such as Benadryl), benzodiazepines (Xanax, Ativan, Valium), antipsychotics, antidepressants and opioids, as well as alcohol and supplements such as kava, melatonin and St. John's Wort. Be cautious with the following combinations:   Brivaracetam (Briviact) Brivaracetam is changed and broken down by the body. CBD might decrease how quickly the body breaks down brivaracetam. This might increase levels of brivaracetam in the body.  Caffeine Caffeine is changed and broken down by the body. CBD might decrease how quickly the body breaks down caffeine. This might increase  levels of caffeine in the body.  Carbamazepine (Tegretol) Carbamazepine is changed and broken down by the body. CBD might decrease how quickly the body breaks down carbamazepine. This might increase levels of carbamazepine in the body and increase its side effects.  Citalopram (Celexa) Citalopram is changed and broken down by the body. CBD might decrease how quickly the body breaks down citalopram. This might increase levels of citalopram in the body and increase its side effects.  Clobazam (Onfi) Clobazam is changed and broken down by the liver. CBD might decrease how quickly the liver breaks down clobazam. This might increase the effects and side effects of clobazam.  Eslicarbazepine (Aptiom) Eslicarbazepine is changed and broken down by the body. CBD might decrease how quickly the body breaks down eslicarbazepine. This might increase levels of eslicarbazepine in the  body by a small amount.  Everolimus (Zostress) Everolimus is changed and broken down by the body. CBD might decrease how quickly the body breaks down everolimus. This might increase levels of everolimus in the body.  Lithium Taking higher doses of CBD might increase levels of lithium. This can increase the risk of lithium toxicity.  Medications changed by the liver (Cytochrome P450 1A1 (CYP1A1) substrates) Some medications are changed and broken down by the liver. CBD might change how quickly the liver breaks down these medications. This could change the effects and side effects of these medications.  Medications changed by the liver (Cytochrome P450 1A2 (CYP1A2) substrates) Some medications are changed and broken down by the liver. CBD might change how quickly the liver breaks down these medications. This could change the effects and side effects of these medications.  Medications changed by the liver (Cytochrome P450 1B1 (CYP1B1) substrates) Some medications are changed and broken down by the liver. CBD might change how  quickly the liver breaks down these medications. This could change the effects and side effects of these medications.  Medications changed by the liver (Cytochrome P450 2A6 (CYP2A6) substrates) Some medications are changed and broken down by the liver. CBD might change how quickly the liver breaks down these medications. This could change the effects and side effects of these medications.  Medications changed by the liver (Cytochrome P450 2B6 (CYP2B6) substrates) Some medications are changed and broken down by the liver. CBD might change how quickly the liver breaks down these medications. This could change the effects and side effects of these medications.  Medications changed by the liver (Cytochrome P450 2C19 (CYP2C19) substrates) Some medications are changed and broken down by the liver. CBD might change how quickly the liver breaks down these medications. This could change the effects and side effects of these medications.  Medications changed by the liver (Cytochrome P450 2C8 (CYP2C8) substrates) Some medications are changed and broken down by the liver. CBD might change how quickly the liver breaks down these medications. This could change the effects and side effects of these medications.  Medications changed by the liver (Cytochrome P450 2C9 (CYP2C9) substrates) Some medications are changed and broken down by the liver. CBD might change how quickly the liver breaks down these medications. This could change the effects and side effects of these medications.  Medications changed by the liver (Cytochrome P450 2D6 (CYP2D6) substrates) Some medications are changed and broken down by the liver. CBD might change how quickly the liver breaks down these medications. This could change the effects and side effects of these medications.  Medications changed by the liver (Cytochrome P450 2E1 (CYP2E1) substrates) Some medications are changed and broken down by the liver. CBD might change how quickly  the liver breaks down these medications. This could change the effects and side effects of these medications.  Medications changed by the liver (Cytochrome P450 3A4 (CYP3A4) substrates) Some medications are changed and broken down by the liver. CBD might change how quickly the liver breaks down these medications. This could change the effects and side effects of these medications.  Medications changed by the liver (Glucuronidated drugs) Some medications are changed and broken down by the liver. CBD might change how quickly the liver breaks down these medications. This could change the effects and side effects of these medications.  Medications that decrease the breakdown of other medications by the liver (Cytochrome P450 2C19 (CYP2C19) inhibitors) CBD is changed and broken down by the liver. Some drugs decrease  how quickly the liver changes and breaks down CBD. This could change the effects and side effects of CBD.  Medications that decrease the breakdown of other medications in the liver (Cytochrome P450 3A4 (CYP3A4) inhibitors) CBD is changed and broken down by the liver. Some drugs decrease how quickly the liver changes and breaks down CBD. This could change the effects and side effects of CBD.  Medications that increase breakdown of other medications by the liver (Cytochrome P450 3A4 (CYP3A4) inducers) CBD is changed and broken down by the liver. Some drugs increase how quickly the liver changes and breaks down CBD. This could change the effects and side effects of CBD.  Medications that increase the breakdown of other medications by the liver (Cytochrome P450 2C19 (CYP2C19) inducers) CBD is changed and broken down by the liver. Some drugs increase how quickly the liver changes and breaks down CBD. This could change the effects and side effects of CBD.  Methadone (Dolophine) Methadone is broken down by the liver. CBD might decrease how quickly the liver breaks down methadone. Taking  cannabidiol along with methadone might increase the effects and side effects of methadone.  Rufinamide (Banzel) Rufinamide is changed and broken down by the body. CBD might decrease how quickly the body breaks down rufinamide. This might increase levels of rufinamide in the body by a small amount.  Sedative medications (CNS depressants) CBD might cause sleepiness and slowed breathing. Some medications, called sedatives, can also cause sleepiness and slowed breathing. Taking CBD with sedative medications might cause breathing problems and/or too much sleepiness.  Sirolimus (Rapamune) Sirolimus is changed and broken down by the body. CBD might decrease how quickly the body breaks down sirolimus. This might increase levels of sirolimus in the body.  Stiripentol (Diacomit) Stiripentol is changed and broken down by the body. CBD might decrease how quickly the body breaks down stiripentol. This might increase levels of stiripentol in the body and increase its side effects.  Tacrolimus (Prograf) Tacrolimus is changed and broken down by the body. CBD might decrease how quickly the body breaks down tacrolimus. This might increase levels of tacrolimus in the body.  Tamoxifen (Soltamox) Tamoxifen is changed and broken down by the body. CBD might affect how quickly the body breaks down tamoxifen. This might affect levels of tamoxifen in the body.  Topiramate (Topamax) Topiramate is changed and broken down by the body. CBD might decrease how quickly the body breaks down topiramate. This might increase levels of topiramate in the body by a small amount.  Valproate Valproic acid can cause liver injury. Taking cannabidiol with valproic acid might increase the chance of liver injury. CBD and/or valproic acid might need to be stopped, or the dose might need to be reduced.  Warfarin (Coumadin) CBD might increase levels of warfarin, which can increase the risk for bleeding. CBD and/or warfarin might need to  be stopped, or the dose might need to be reduced.  Zonisamide Zonisamide is changed and broken down by the body. CBD might decrease how quickly the body breaks down zonisamide. This might increase levels of zonisamide in the body by a small amount. (Last update: 11/10/2021) ____________________________________________________________________________________________   ____________________________________________________________________________________________  Naloxone Nasal Spray  Why am I receiving this medication? [place] STOP ACT requires that all patients taking high dose opioids or at risk of opioids respiratory depression, be prescribed an opioid reversal agent, such as Naloxone (AKA: Narcan).  What is this medication? NALOXONE (nal OX one) treats opioid overdose, which causes slow or  shallow breathing, severe drowsiness, or trouble staying awake. Call emergency services after using this medication. You may need additional treatment. Naloxone works by reversing the effects of opioids. It belongs to a group of medications called opioid blockers.  COMMON BRAND NAME(S): Kloxxado, Narcan  What should I tell my care team before I take this medication? They need to know if you have any of these conditions: Heart disease Substance use disorder An unusual or allergic reaction to naloxone, other medications, foods, dyes, or preservatives Pregnant or trying to get pregnant Breast-feeding  When to use this medication? This medication is to be used for the treatment of respiratory depression (less than 8 breaths per minute) secondary to opioid overdose.   How to use this medication? This medication is for use in the nose. Lay the person on their back. Support their neck with your hand and allow the head to tilt back before giving the medication. The nasal spray should be given into 1 nostril. After giving the medication, move the person onto their side. Do not remove or test the nasal  spray until ready to use. Get emergency medical help right away after giving the first dose of this medication, even if the person wakes up. You should be familiar with how to recognize the signs and symptoms of a narcotic overdose. If more doses are needed, give the additional dose in the other nostril. Talk to your care team about the use of this medication in children. While this medication may be prescribed for children as young as newborns for selected conditions, precautions do apply.  Naloxone Overdosage: If you think you have taken too much of this medicine contact a poison control center or emergency room at once.  NOTE: This medicine is only for you. Do not share this medicine with others.  What if I miss a dose? This does not apply.  What may interact with this medication? This is only used during an emergency. No interactions are expected during emergency use. This list may not describe all possible interactions. Give your health care provider a list of all the medicines, herbs, non-prescription drugs, or dietary supplements you use. Also tell them if you smoke, drink alcohol, or use illegal drugs. Some items may interact with your medicine.  What should I watch for while using this medication? Keep this medication ready for use in the case of an opioid overdose. Make sure that you have the phone number of your care team and local hospital ready. You may need to have additional doses of this medication. Each nasal spray contains a single dose. Some emergencies may require additional doses. After use, bring the treated person to the nearest hospital or call 911. Make sure the treating care team knows that the person has received a dose of this medication. You will receive additional instructions on what to do during and after use of this medication before an emergency occurs.  What side effects may I notice from receiving this medication? Side effects that you should report to your  care team as soon as possible: Allergic reactions--skin rash, itching, hives, swelling of the face, lips, tongue, or throat Side effects that usually do not require medical attention (report these to your care team if they continue or are bothersome): Constipation Dryness or irritation inside the nose Headache Increase in blood pressure Muscle spasms Stuffy nose Toothache This list may not describe all possible side effects. Call your doctor for medical advice about side effects. You may report side effects  to FDA at 1-800-FDA-1088.  Where should I keep my medication? Because this is an emergency medication, you should keep it with you at all times.  Keep out of the reach of children and pets. Store between 20 and 25 degrees C (68 and 77 degrees F). Do not freeze. Throw away any unused medication after the expiration date. Keep in original box until ready to use.  NOTE: This sheet is a summary. It may not cover all possible information. If you have questions about this medicine, talk to your doctor, pharmacist, or health care provider.   2023 Elsevier/Gold Standard (2021-05-07 00:00:00)  ____________________________________________________________________________________________

## 2022-08-22 ENCOUNTER — Ambulatory Visit: Payer: Medicare Other | Attending: Pain Medicine | Admitting: Pain Medicine

## 2022-08-22 ENCOUNTER — Encounter: Payer: Self-pay | Admitting: Pain Medicine

## 2022-08-22 ENCOUNTER — Other Ambulatory Visit: Payer: Self-pay

## 2022-08-22 VITALS — BP 139/69 | HR 82 | Temp 97.2°F | Resp 18 | Ht 62.5 in | Wt 180.0 lb

## 2022-08-22 DIAGNOSIS — M25551 Pain in right hip: Secondary | ICD-10-CM

## 2022-08-22 DIAGNOSIS — G8929 Other chronic pain: Secondary | ICD-10-CM | POA: Diagnosis present

## 2022-08-22 DIAGNOSIS — Z79891 Long term (current) use of opiate analgesic: Secondary | ICD-10-CM | POA: Diagnosis present

## 2022-08-22 DIAGNOSIS — M79602 Pain in left arm: Secondary | ICD-10-CM

## 2022-08-22 DIAGNOSIS — M79605 Pain in left leg: Secondary | ICD-10-CM | POA: Diagnosis present

## 2022-08-22 DIAGNOSIS — Z79899 Other long term (current) drug therapy: Secondary | ICD-10-CM | POA: Diagnosis present

## 2022-08-22 DIAGNOSIS — M25552 Pain in left hip: Secondary | ICD-10-CM

## 2022-08-22 DIAGNOSIS — G894 Chronic pain syndrome: Secondary | ICD-10-CM

## 2022-08-22 DIAGNOSIS — M5459 Other low back pain: Secondary | ICD-10-CM

## 2022-08-22 DIAGNOSIS — M79601 Pain in right arm: Secondary | ICD-10-CM | POA: Diagnosis present

## 2022-08-22 DIAGNOSIS — M797 Fibromyalgia: Secondary | ICD-10-CM

## 2022-08-22 DIAGNOSIS — M79604 Pain in right leg: Secondary | ICD-10-CM | POA: Diagnosis not present

## 2022-08-22 DIAGNOSIS — M545 Low back pain, unspecified: Secondary | ICD-10-CM

## 2022-08-22 DIAGNOSIS — M47812 Spondylosis without myelopathy or radiculopathy, cervical region: Secondary | ICD-10-CM

## 2022-08-22 DIAGNOSIS — M542 Cervicalgia: Secondary | ICD-10-CM

## 2022-08-22 MED ORDER — NALOXONE HCL 4 MG/0.1ML NA LIQD: 1.0000 | NASAL | 0 refills | Status: DC | PRN

## 2022-08-22 MED ORDER — HYDROCODONE-ACETAMINOPHEN 5-325 MG PO TABS: 1.0000 | ORAL_TABLET | ORAL | 0 refills | Status: DC | PRN

## 2022-08-22 NOTE — Progress Notes (Signed)
Nursing Pain Medication Assessment:  Safety precautions to be maintained throughout the outpatient stay will include: orient to surroundings, keep bed in low position, maintain call bell within reach at all times, provide assistance with transfer out of bed and ambulation.  Medication Inspection Compliance: Pill count conducted under aseptic conditions, in front of the patient. Neither the pills nor the bottle was removed from the patient's sight at any time. Once count was completed pills were immediately returned to the patient in their original bottle.  Medication: Hydrocodone/APAP Pill/Patch Count:  22 of 180 pills remain Pill/Patch Appearance: Markings consistent with prescribed medication Bottle Appearance: Standard pharmacy container. Clearly labeled. Filled Date: 62 / 07 / 2023 Last Medication intake:  Today

## 2022-09-19 ENCOUNTER — Other Ambulatory Visit: Payer: Self-pay | Admitting: Dermatology

## 2022-09-19 DIAGNOSIS — L439 Lichen planus, unspecified: Secondary | ICD-10-CM

## 2022-10-17 ENCOUNTER — Other Ambulatory Visit: Payer: Self-pay | Admitting: Dermatology

## 2022-10-17 DIAGNOSIS — L439 Lichen planus, unspecified: Secondary | ICD-10-CM

## 2022-10-18 ENCOUNTER — Other Ambulatory Visit: Payer: Self-pay | Admitting: Dermatology

## 2022-10-18 DIAGNOSIS — L439 Lichen planus, unspecified: Secondary | ICD-10-CM

## 2022-10-24 ENCOUNTER — Telehealth: Payer: Self-pay

## 2022-10-24 DIAGNOSIS — L439 Lichen planus, unspecified: Secondary | ICD-10-CM

## 2022-10-24 NOTE — Telephone Encounter (Signed)
Patient called and scheduled f/u appt for next week with Dr. Nehemiah Massed. 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 Patient uses [facility] Please advise.

## 2022-10-25 MED ORDER — METRONIDAZOLE 500 MG PO TABS: 500.0000 mg | ORAL_TABLET | Freq: Two times a day (BID) | ORAL | 0 refills | Status: DC

## 2022-10-25 NOTE — Telephone Encounter (Signed)
Medication sent electronically. No answer at number listed in patient's chart.

## 2022-11-02 ENCOUNTER — Ambulatory Visit (INDEPENDENT_AMBULATORY_CARE_PROVIDER_SITE_OTHER): Payer: 59 | Admitting: Dermatology

## 2022-11-02 DIAGNOSIS — C44619 Basal cell carcinoma of skin of left upper limb, including shoulder: Secondary | ICD-10-CM | POA: Diagnosis not present

## 2022-11-02 DIAGNOSIS — L578 Other skin changes due to chronic exposure to nonionizing radiation: Secondary | ICD-10-CM | POA: Diagnosis not present

## 2022-11-02 DIAGNOSIS — L814 Other melanin hyperpigmentation: Secondary | ICD-10-CM | POA: Diagnosis not present

## 2022-11-02 DIAGNOSIS — L439 Lichen planus, unspecified: Secondary | ICD-10-CM | POA: Diagnosis not present

## 2022-11-02 DIAGNOSIS — Z1283 Encounter for screening for malignant neoplasm of skin: Secondary | ICD-10-CM | POA: Diagnosis not present

## 2022-11-02 DIAGNOSIS — L820 Inflamed seborrheic keratosis: Secondary | ICD-10-CM | POA: Diagnosis not present

## 2022-11-02 DIAGNOSIS — D485 Neoplasm of uncertain behavior of skin: Secondary | ICD-10-CM

## 2022-11-02 NOTE — Patient Instructions (Addendum)
Cryotherapy Aftercare  Wash gently with soap and water everyday.   Apply Vaseline and Band-Aid daily until healed.     Shave Excision Benign Lesion Wound Care Instructions  Leave the original bandage on for 24 hours if possible.  If the bandage becomes soaked or soiled before that time, it is OK to remove it and examine the wound.  A small amount of post-operative bleeding is normal.  If excessive bleeding occurs, remove the bandage, place gauze over the site and apply continuous pressure (no peeking) over the area for 20-30 minutes.  If this does not stop the bleeding, try again for 40 minutes.  If this does not work, please call our clinic as soon as possible (even if after-hours).    Twice a day, cleanse the wound with soap and water.  If a thick crust develops you may use a Q-tip dipped into dilute hydrogen peroxide (mix 1:1 with water) to dissolve it.  Hydrogen peroxide can slow the healing process, so use it only as needed.  After washing, apply Vaseline jelly or Polysporin ointment.  For best healing, the wound should be covered with a layer of ointment at all times.  This may mean re-applying the ointment several times a day.  For open wounds, continue until it has healed.    If you have any swelling, keep the area elevated.  Some redness, tenderness and white or yellow material in the wound is normal healing.  If the area becomes very sore and red, or develops a thick yellow-green material (pus), it may be infected; please notify us.    Wound healing continues for up to one year following surgery.  It is not unusual to experience pain in the scar from time to time during the interval.  If the pain becomes severe or the scar thickens, you should notify the office.  A slight amount of redness in a scar is expected for the first six months.  After six months, the redness subsides and the scar will soften and fade.  The color difference becomes less noticeable with time.  If there are any  problems, return for a post-op surgery check at your earliest convenience.  Please call our office for any questions or concerns.   Due to recent changes in healthcare laws, you may see results of your pathology and/or laboratory studies on MyChart before the doctors have had a chance to review them. We understand that in some cases there may be results that are confusing or concerning to you. Please understand that not all results are received at the same time and often the doctors may need to interpret multiple results in order to provide you with the best plan of care or course of treatment. Therefore, we ask that you please give [place] 2 business days to thoroughly review all your results before contacting the office for clarification. Should we see a critical lab result, you will be contacted sooner.   If You Need Anything After Your Visit  If you have any questions or concerns for your doctor, please call our main line at [phone] and press option 4 to reach your doctor's medical assistant. If no one answers, please leave a voicemail as directed and we will return your call as soon as possible. Messages left after 4 pm will be answered the following business day.   You may also send [place] a message via [place]. We typically respond to MyChart messages within 1-2 business days.  For prescription refills, please ask  your pharmacy to contact our office. Our fax number is [phone].  If you have an urgent issue when the clinic is closed that cannot wait until the next business day, you can page your doctor at the number below.    Please note that while we do our best to be available for urgent issues outside of office hours, we are not available 24/7.   If you have an urgent issue and are unable to reach [place], you may choose to seek medical care at your doctor's office, retail clinic, urgent care center, or emergency room.  If you have a medical emergency, please immediately call 911 or go to  the emergency department.  Pager Numbers  - Dr. Nehemiah Massed: [phone]  - Dr. Laurence Ferrari: [phone]  - Dr. Nicole Kindred: [phone]  In the event of inclement weather, please call our main line at [phone] for an update on the status of any delays or closures.  Dermatology Medication Tips: Please keep the boxes that topical medications come in in order to help keep track of the instructions about where and how to use these. Pharmacies typically print the medication instructions only on the boxes and not directly on the medication tubes.   If your medication is too expensive, please contact our office at [phone] option 4 or send [place] a message through [place].   We are unable to tell what your co-pay for medications will be in advance as this is different depending on your insurance coverage. However, we may be able to find a substitute medication at lower cost or fill out paperwork to get insurance to cover a needed medication.   If a prior authorization is required to get your medication covered by your insurance company, please allow [place] 1-2 business days to complete this process.  Drug prices often vary depending on where the prescription is filled and some pharmacies may offer cheaper prices.  The website www.goodrx.com contains coupons for medications through different pharmacies. The prices here do not account for what the cost may be with help from insurance (it may be cheaper with your insurance), but the website can give you the price if you did not use any insurance.  - You can print the associated coupon and take it with your prescription to the pharmacy.  - You may also stop by our office during regular business hours and pick up a GoodRx coupon card.  - If you need your prescription sent electronically to a different pharmacy, notify our office through [facility] or by phone at [phone] option 4.     Si Usted Necesita Algo Despus de Su Visita  Tambin puede  enviarnos un mensaje a travs de Pharmacist, community. Por lo general respondemos a los mensajes de MyChart en el transcurso de 1 a 2 das hbiles.  Para renovar recetas, por favor pida a su farmacia que se ponga en contacto con nuestra oficina. Harland Dingwall de fax es [place] [phone].  Si tiene un asunto urgente cuando la clnica est cerrada y que no puede esperar hasta el siguiente da hbil, puede llamar/localizar a su doctor(a) al nmero que aparece a continuacin.   Por favor, tenga en cuenta que aunque hacemos todo lo posible para estar disponibles para asuntos urgentes fuera del horario de [place], no estamos disponibles las 24 horas del da, los 7 das de la [place].   Si tiene un problema urgente y no puede comunicarse con nosotros, puede optar por buscar atencin mdica  en el consultorio de su doctor(a),  en una clnica privada, en un centro de atencin urgente o en una sala de emergencias.  Si tiene Engineering geologist, por favor llame inmediatamente al 911 o vaya a la sala de emergencias.  Nmeros de bper  - Dr. Nehemiah Massed: [phone]  - Dra. Moye: [phone]  - Dra. Nicole Kindred: [phone]  En caso de inclemencias del [place], por favor llame a Johnsie Kindred principal al [phone] para una actualizacin sobre el [place] de cualquier retraso o cierre.  Consejos para la medicacin en dermatologa: Por favor, guarde las cajas en las que vienen los medicamentos de uso tpico para ayudarle a seguir las instrucciones sobre dnde y cmo usarlos. Las farmacias generalmente imprimen las instrucciones del medicamento slo en las cajas y no directamente en los tubos del [place].   Si su medicamento es muy caro, por favor, pngase en contacto con Zigmund Daniel llamando al [phone] y presione la opcin 4 o envenos un mensaje a travs de Pharmacist, community.   No podemos decirle cul ser su copago por los medicamentos por adelantado ya que esto es diferente dependiendo de la cobertura de su seguro.  Sin embargo, es posible que podamos encontrar un medicamento sustituto a Electrical engineer un formulario para que el seguro cubra el medicamento que se considera necesario.   Si se requiere una autorizacin previa para que su compaa de seguros [place] su medicamento, por favor permtanos de 1 a 2 das hbiles para completar este proceso.  Los precios de los medicamentos varan con frecuencia dependiendo del Environmental consultant de dnde se surte la receta y alguna farmacias pueden ofrecer precios ms baratos.  El sitio web www.goodrx.com tiene cupones para medicamentos de Airline pilot. Los precios aqu no tienen en cuenta lo que podra costar con la ayuda del seguro (puede ser ms barato con su seguro), pero el sitio web puede darle el precio si no utiliz Research scientist (physical sciences).  - Puede imprimir el cupn correspondiente y llevarlo con su receta a la farmacia.  - Tambin puede pasar por nuestra oficina durante el horario de atencin regular y Charity fundraiser una tarjeta de cupones de GoodRx.  - Si necesita que su receta se enve electrnicamente a una farmacia diferente, informe a nuestra oficina a travs de MyChart de [place] o por telfono llamando al [phone] y presione la opcin 4.

## 2022-11-02 NOTE — Progress Notes (Unsigned)
Follow-Up Visit   Subjective  Zoe Sanchez is a 67 y.o. female who presents for the following: Follow-up (Lichen planus follow up - Amoxicillin 250 mg 1 po bid, Opzelura cream did not help) and Other (Spots - one on each leg and one on chest that she thinks may be skin cancer). The patient has spots, moles and lesions to be evaluated, some may be new or changing and the patient has concerns that these could be cancer.  The following portions of the chart were reviewed this encounter and updated as appropriate:   Tobacco  Allergies  Meds  Problems  Med Hx  Surg Hx  Fam Hx     Review of Systems:  No other skin or systemic complaints except as noted in HPI or Assessment and Plan.  Objective  Well appearing patient in no apparent distress; mood and affect are within normal limits.  A focused examination was performed including chest, legs. Relevant physical exam findings are noted in the Assessment and Plan.  Left pretibial x 1, right pretibial x 1 (2) Erythematous stuck-on, waxy papule or plaque  Left Shoulder - Anterior 0.7 cm pink papule        Assessment & Plan  Inflamed seborrheic keratosis (2) Left pretibial x 1, right pretibial x 1 If not resolved after 6 weeks, RTC. Destruction of lesion - Left pretibial x 1, right pretibial x 1 Complexity: simple   Destruction method: cryotherapy   Informed consent: discussed and consent obtained   Timeout:  patient name, date of birth, surgical site, and procedure verified Lesion destroyed using liquid nitrogen: Yes   Region frozen until ice ball extended beyond lesion: Yes   Outcome: patient tolerated procedure well with no complications   Post-procedure details: wound care instructions given    Neoplasm of uncertain behavior of skin Left Shoulder - Anterior Epidermal / dermal shaving  Lesion diameter (cm):  0.7 Informed consent: discussed and consent obtained   Timeout: patient name, date of birth, surgical site,  and procedure verified   Procedure prep:  Patient was prepped and draped in usual sterile fashion Prep type:  Isopropyl alcohol Anesthesia: the lesion was anesthetized in a standard fashion   Anesthetic:  1% lidocaine w/ epinephrine 1-100,000 buffered w/ 8.4% NaHCO3 Instrument used: flexible razor blade   Hemostasis achieved with: pressure, aluminum chloride and electrodesiccation   Outcome: patient tolerated procedure well   Post-procedure details: sterile dressing applied and wound care instructions given   Dressing type: bandage and petrolatum    Destruction of lesion Complexity: extensive   Destruction method: electrodesiccation and curettage   Informed consent: discussed and consent obtained   Timeout:  patient name, date of birth, surgical site, and procedure verified Procedure prep:  Patient was prepped and draped in usual sterile fashion Prep type:  Isopropyl alcohol Anesthesia: the lesion was anesthetized in a standard fashion   Anesthetic:  1% lidocaine w/ epinephrine 1-100,000 buffered w/ 8.4% NaHCO3 Curettage performed in three different directions: Yes   Electrodesiccation performed over the curetted area: Yes   Lesion length (cm):  0.7 Lesion width (cm):  0.7 Margin per side (cm):  0.2 Final wound size (cm):  1.1 Hemostasis achieved with:  pressure and aluminum chloride Outcome: patient tolerated procedure well with no complications   Post-procedure details: sterile dressing applied and wound care instructions given   Dressing type: bandage and petrolatum    Specimen 1 - Surgical pathology Differential Diagnosis: BCC vs other  Check Margins: No EDC  today  Lichen planus generalized Chronic and persistent condition with duration or expected duration over one year. Condition is symptomatic / bothersome to patient. Not to goal. Better controlled on oral Amoxicillin.  She would like to continue this.  Continue Amoxicillin 250 mg 1 po bid Long term medication  management.  Patient is using long term (months to years) prescription medication  to control their dermatologic condition.  These medications require periodic monitoring to evaluate for efficacy and side effects and may require periodic laboratory monitoring.  Lentigines - Scattered tan macules - Due to sun exposure - Benign-appearing, observe - Recommend daily broad spectrum sunscreen SPF 30+ to sun-exposed areas, reapply every 2 hours as needed. - Call for any changes  Actinic Damage - chronic, secondary to cumulative UV radiation exposure/sun exposure over time - diffuse scaly erythematous macules with underlying dyspigmentation - Recommend daily broad spectrum sunscreen SPF 30+ to sun-exposed areas, reapply every 2 hours as needed.  - Recommend staying in the shade or wearing long sleeves, sun glasses (UVA+UVB protection) and wide brim hats (4-inch brim around the entire circumference of the hat). - Call for new or changing lesions.  Return in about 6 months (around 05/03/2023) for Follow up.  I, Ashok Cordia, CMA, am acting as scribe for Sarina Ser, MD . Documentation: I have reviewed the above documentation for accuracy and completeness, and I agree with the above.  Sarina Ser, MD

## 2022-11-07 ENCOUNTER — Encounter: Payer: Self-pay | Admitting: Dermatology

## 2022-11-08 ENCOUNTER — Telehealth: Payer: Self-pay

## 2022-11-08 NOTE — Telephone Encounter (Signed)
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

## 2022-11-08 NOTE — Telephone Encounter (Signed)
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

## 2022-11-15 ENCOUNTER — Telehealth: Payer: Self-pay

## 2022-11-15 ENCOUNTER — Other Ambulatory Visit: Payer: Self-pay

## 2022-11-15 MED ORDER — METRONIDAZOLE 500 MG PO TABS: 500.0000 mg | ORAL_TABLET | Freq: Two times a day (BID) | ORAL | 6 refills | Status: DC

## 2022-11-15 NOTE — Telephone Encounter (Signed)
PT states that she is out of refills of Metronidazole 500 mg po BID and would like it refilled as it is the only thing that keeps her lichen planus under control.

## 2022-11-15 NOTE — Telephone Encounter (Signed)
Metronidazole 500 mg #60 take bid with 6 RF sent to [place]

## 2022-11-15 NOTE — Telephone Encounter (Signed)
Called to make sure she is not taking Amoxicillin tablets, pt report she has never taken Amoxicillin for her Lichen planus she has always took Flagyl.

## 2022-11-18 ENCOUNTER — Telehealth: Payer: Self-pay

## 2022-11-18 NOTE — Telephone Encounter (Signed)
Patient left nurse VM about not getting medication or hearing back from our office. Metronidazole was sent in by another CMA to [facility]. Called patient today but no answer and unable to leave VM with out a access code. aw

## 2022-11-21 ENCOUNTER — Encounter: Payer: Self-pay | Admitting: Pain Medicine

## 2022-11-21 ENCOUNTER — Ambulatory Visit: Payer: 59 | Attending: Pain Medicine | Admitting: Pain Medicine

## 2022-11-21 DIAGNOSIS — M79605 Pain in left leg: Secondary | ICD-10-CM | POA: Diagnosis present

## 2022-11-21 DIAGNOSIS — Z79899 Other long term (current) drug therapy: Secondary | ICD-10-CM

## 2022-11-21 DIAGNOSIS — M79601 Pain in right arm: Secondary | ICD-10-CM | POA: Diagnosis present

## 2022-11-21 DIAGNOSIS — M542 Cervicalgia: Secondary | ICD-10-CM | POA: Insufficient documentation

## 2022-11-21 DIAGNOSIS — M5459 Other low back pain: Secondary | ICD-10-CM | POA: Diagnosis present

## 2022-11-21 DIAGNOSIS — G894 Chronic pain syndrome: Secondary | ICD-10-CM | POA: Diagnosis present

## 2022-11-21 DIAGNOSIS — M797 Fibromyalgia: Secondary | ICD-10-CM | POA: Diagnosis present

## 2022-11-21 DIAGNOSIS — M79602 Pain in left arm: Secondary | ICD-10-CM | POA: Diagnosis present

## 2022-11-21 DIAGNOSIS — M25552 Pain in left hip: Secondary | ICD-10-CM | POA: Insufficient documentation

## 2022-11-21 DIAGNOSIS — M79604 Pain in right leg: Secondary | ICD-10-CM | POA: Diagnosis present

## 2022-11-21 DIAGNOSIS — M545 Low back pain, unspecified: Secondary | ICD-10-CM

## 2022-11-21 DIAGNOSIS — G8929 Other chronic pain: Secondary | ICD-10-CM | POA: Diagnosis present

## 2022-11-21 DIAGNOSIS — Z79891 Long term (current) use of opiate analgesic: Secondary | ICD-10-CM | POA: Diagnosis present

## 2022-11-21 DIAGNOSIS — M25551 Pain in right hip: Secondary | ICD-10-CM | POA: Insufficient documentation

## 2022-11-21 DIAGNOSIS — M47812 Spondylosis without myelopathy or radiculopathy, cervical region: Secondary | ICD-10-CM | POA: Diagnosis present

## 2022-11-21 MED ORDER — HYDROCODONE-ACETAMINOPHEN 5-325 MG PO TABS: 1.0000 | ORAL_TABLET | ORAL | 0 refills | Status: DC | PRN

## 2022-11-21 NOTE — Progress Notes (Signed)
PROVIDER NOTE: Information contained herein reflects review and annotations entered in association with encounter. Interpretation of such information and data should be left to medically-trained personnel. Information provided to patient can be located elsewhere in the medical record under "Patient Instructions". Document created using STT-dictation technology, any transcriptional errors that may result from process are unintentional.    Patient: Zoe Sanchez  Service Category: E/M  Provider: Gaspar Cola, MD  DOB: 1956/08/07  DOS: 11/21/2022  Referring Provider: Denton Lank, MD  MRN: [id number]  Specialty: Interventional Pain Management  PCP: Denton Lank, MD  Type: Established Patient  Setting: Ambulatory outpatient    Location: Office  Delivery: Face-to-face     HPI  [name], a 67 y.o. year old female, is here today because of her No primary diagnosis found.. Zoe Sanchez's primary complain today is Back Pain (Lower right side worse)  Pertinent problems: Zoe Sanchez has Myofascial pain syndrome; Cervical facet syndrome (Bilateral) (R>L); Chronic neck pain (4th area of Pain) (Bilateral) (R>L); Chronic pain syndrome; Cervical spondylosis; Fibromyalgia; Cervical herniated disc (C5-6 and C6-7); Cervical foraminal stenosis (Bilateral) (C5-6); Chronic cervical radicular pain (Bilateral) (R>L) (C8 Dermatome); Chronic low back pain (Bilateral) (L>R) w/ sciatica (Bilateral) (L>R); Spondylosis of lumbar spine; Family history of chronic pain; Carpal tunnel syndrome (Left); Chronic shoulder pain (Bilateral); Cervical facet arthropathy; Lumbar facet arthropathy; Bimalleolar fracture of left ankle; Lumbar facet syndrome (Bilateral) (R>L); Chronic upper extremity pain (5th area of Pain) (Bilateral) (R>L); Chronic lower extremity pain (2ry area of Pain) (Bilateral) (L>R); Chronic lumbar radicular pain (Bilateral) (S1); Arthropathy of lumbar facet joint; Cervical syndrome; Lumbar spondylosis; Synovial  cyst of lumbar spine; Prolapsed cervical intervertebral disc; Lumbar facet joint pain; Cervical radiculopathy; Stenosis of intervertebral foramina; Chronic hip pain (3ry area of Pain) (Bilateral); Shoulder pain; Chronic musculoskeletal pain; Neurogenic pain; Closed trimalleolar fracture of left ankle; Bilateral lower extremity edema; Chronic pain of multiple sites; Closed fracture of fifth metatarsal bone; Lumbosacral sensory radiculopathy at S1 (Bilateral); Decreased range of motion of lumbar spine; Numbness and tingling of lower extremity (Bilateral); Chronic low back pain (1ry area of Pain) (Bilateral) w/o sciatica; and Abnormal MRI, lumbar spine (06/05/2021) on their pertinent problem list. Pain Assessment: Severity of Chronic pain is reported as a 3 /10. Location: Back Right, Lower/denies to right knee. Onset: More than a month ago. Quality: Constant, Sharp. Timing: Constant. Modifying factor(s): meds. Vitals:  height is '5\' 3"'$  (1.6 m) and weight is 165 lb (74.8 kg). Her temporal temperature is 96.8 F (36 C) (abnormal). Her blood pressure is 119/54 (abnormal) and her pulse is 79. Her oxygen saturation is 100%.  BMI: Estimated body mass index is 29.23 kg/m as calculated from the following:   Height as of this encounter: '5\' 3"'$  (1.6 m).   Weight as of this encounter: 165 lb (74.8 kg). Last encounter: 08/22/2022. Last procedure: 04/20/2017.  Reason for encounter: medication management.  The patient indicates doing well with the current medication regimen. No adverse reactions or side effects reported to the medications.   The patient comes in indicating worsening of her low back pain and lower extremity pain.  Today she states that her worst pain is in the right side of her lower back and will occasionally go down the right lower extremity all the way down into her foot.  She describes having occasionally sharp stabbing pains in that right leg.  She also describes having bilateral hip pain.  Provocative  Patrick maneuver was positive bilaterally for pain coming from  the sacroiliac joint area as well as the hip joints with decreased range of motion of both hips but the worst pain seems to be on the left hip.  The patient wants to have updated imaging studies of her hips and lower back.  She describes thinking about the possibility of surgery.  Today I have ordered bilateral hip joint diagnostic x-rays as well as x-rays of the lumbar spine on flexion and extension.  Due to the radicular nature of her right lower extremity pain, I have also ordered an MRI of the lumbar spine.  The patient has 1 that was done in 2022, but she states that things have changed and this pain seems to be worse than what she had before.  RTCB: 02/21/2023   Pharmacotherapy Assessment  Analgesic: Hydrocodone/APAP 5/325, 1 tab PO q 4 hrs (30 mg/day of hydrocodone)(1,950 mg/day of acetaminophen) MME/day: 30 mg/day.   Monitoring: [place] PMP: PDMP reviewed during this encounter.       Pharmacotherapy: No side-effects or adverse reactions reported. Compliance: No problems identified. Effectiveness: Clinically acceptable.  Zoe Colt, RN  11/21/2022  2:18 PM  Sign when Signing Visit Nursing Pain Medication Assessment:  Safety precautions to be maintained throughout the outpatient stay will include: orient to surroundings, keep bed in low position, maintain call bell within reach at all times, provide assistance with transfer out of bed and ambulation.  Medication Inspection Compliance: Pill count conducted under aseptic conditions, in front of the patient. Neither the pills nor the bottle was removed from the patient's sight at any time. Once count was completed pills were immediately returned to the patient in their original bottle.  Medication: Hydrocodone/APAP Pill/Patch Count:  85 of 180 pills remain Pill/Patch Appearance: Markings consistent with prescribed medication Bottle Appearance: Standard pharmacy container. Clearly  labeled. Filled Date: 02 / 12 / 2024 Last Medication intake:  Today    No results found for: "CBDTHCR" No results found for: "D8THCCBX" No results found for: "D9THCCBX"  UDS:  Summary  Date Value Ref Range Status  02/16/2022 Note  Final    Comment:    ==================================================================== [employer] Select 13 (MW) ==================================================================== Test                             Result       Flag       Units  Drug Present and Declared for Prescription Verification   Hydrocodone                    6353         EXPECTED   ng/mg creat   Hydromorphone                  284          EXPECTED   ng/mg creat   Dihydrocodeine                 1529         EXPECTED   ng/mg creat   Norhydrocodone                 2081         EXPECTED   ng/mg creat    Sources of hydrocodone include scheduled prescription medications.    Hydromorphone, dihydrocodeine and norhydrocodone are expected    metabolites of hydrocodone. Hydromorphone and dihydrocodeine are    also available as scheduled prescription medications.  Drug Present not Declared  for Prescription Verification   Oxazepam                       23           UNEXPECTED ng/mg creat    Oxazepam may be administered as a scheduled prescription medication;    it is also an expected metabolite of other benzodiazepine drugs,    including diazepam, chlordiazepoxide, prazepam, clorazepate,    halazepam, and temazepam.  ==================================================================== Test                      Result    Flag   Units      Ref Range   Creatinine              124              mg/dL      >=20 ==================================================================== Declared Medications:  The flagging and interpretation on this report are based on the  following declared medications.  Unexpected results may arise from  inaccuracies in the declared medications.   **Note: The  testing scope of this panel includes these medications:   Hydrocodone (Norco)   **Note: The testing scope of this panel does not include the  following reported medications:   Acetaminophen (Norco)  Albuterol  Amitriptyline (Elavil)  Amoxicillin  Cholecalciferol  Cyanocobalamin  Cyclobenzaprine (Flexeril)  Dicyclomine (Bentyl)  Estradiol (Estrace)  Fluticasone (Flonase)  Magnesium (Magonate)  Melatonin  Metronidazole (Flagyl)  Montelukast  Omeprazole  Pregabalin (Lyrica)  Propranolol (Inderal)  Rosuvastatin (Crestor)  Salmeterol  Topical  Umeclidinium (Incruse Ellipta)  Zolpidem (Ambien) ==================================================================== For clinical consultation, please call [phone]. ====================================================================       ROS  Constitutional: Denies any fever or chills Gastrointestinal: No reported hemesis, hematochezia, vomiting, or acute GI distress Musculoskeletal: Denies any acute onset joint swelling, redness, loss of ROM, or weakness Neurological: No reported episodes of acute onset apraxia, aphasia, dysarthria, agnosia, amnesia, paralysis, loss of coordination, or loss of consciousness  Medication Review  HYDROcodone-acetaminophen, Magnesium Gluconate, albuterol, amitriptyline, betamethasone dipropionate, cyclobenzaprine, dicyclomine, docusate sodium, estradiol, fluticasone, gemfibrozil, metoprolol tartrate, metroNIDAZOLE, montelukast, naloxone, omeprazole, potassium chloride, pregabalin, propranolol ER, rosuvastatin, salmeterol, senna, umeclidinium bromide, and zolpidem  History Review  Allergy: Zoe Sanchez is allergic to prednisone. Drug: Zoe Sanchez  reports no history of drug use. Alcohol:  reports no history of alcohol use. Tobacco:  reports that she has never smoked. She has never used smokeless tobacco. Social: Zoe Sanchez  reports that she has never smoked. She has never used smokeless  tobacco. She reports that she does not drink alcohol and does not use drugs. Medical:  has a past medical history of Abnormal heart rhythm, Abnormal mammogram (02/25/2014), Actinic keratosis, Anemia, Anxiety, Asthma, Awareness of heartbeats (06/29/2015), Basal cell carcinoma (04/24/2008), Basal cell carcinoma (04/09/2009), Basal cell carcinoma (11/02/2022), Basal cell carcinoma (BCC) (12/23/2014), Breath shortness (06/29/2015), CAD (coronary artery disease), Cancer (HCC), Cervical radicular pain (Location of Secondary source of pain) (Bilateral) (R>L) (C8 Dermatome) (06/29/2015), Chronic pain, COPD (chronic obstructive pulmonary disease) ([place]), DDD (degenerative disc disease), cervical, Degenerative disk disease, Depression, Dysrhythmia, Endometriosis, Family history of chronic pain (06/29/2015), Fibromyalgia, Fibromyalgia, GERD (gastroesophageal reflux disease), Heart murmur, Hiatal hernia, History of attempted suicide (06/29/2015), History of hiatal hernia (06/29/2015), History of psychiatric disorder (06/29/2015), History of suicidal ideation (06/29/2015), Hyperlipidemia, Insomnia, Insomnia, Irritable bowel syndrome, Squamous cell carcinoma in situ (10/14/2021), Squamous cell carcinoma of skin (03/08/2007), 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 Surgical: Zoe Sanchez  has a past surgical history that includes Abdominal hysterectomy (1986); Tonsillectomy (1957); Skin lesion excision; Oophorectomy (1986); Breast surgery; ORIF ankle fracture (Left, 03/14/2018); ORIF ankle fracture (Left, 07/26/2018); Ankle surgery; Colonoscopy with propofol (N/A, 06/05/2019); Breast biopsy (Right, 2016); Fracture surgery; and Colonoscopy with propofol (N/A, 03/05/2020). Family: family history includes Breast cancer in her maternal aunt; Heart disease in her father; Hypertension in her father and mother.  Laboratory Chemistry Profile   Renal Lab Results  Component Value  Date   BUN 21 04/26/2019   CREATININE 0.84 04/26/2019   GFRAA >60 04/26/2019   GFRNONAA >60 04/26/2019    Hepatic Lab Results  Component Value Date   AST 35 04/26/2019   ALT 22 04/26/2019   ALBUMIN 4.1 04/26/2019   ALKPHOS 71 04/26/2019    Electrolytes Lab Results  Component Value Date   NA 135 04/26/2019   K 4.5 04/26/2019   CL 103 04/26/2019   CALCIUM 9.4 04/26/2019   MG 1.8 04/26/2019    Bone Lab Results  Component Value Date   VD25OH 40.8 04/26/2019   25OHVITD1 49 03/09/2016   25OHVITD2 <1.0 03/09/2016   25OHVITD3 48 03/09/2016    Inflammation (CRP: Acute Phase) (ESR: Chronic Phase) Lab Results  Component Value Date   CRP 2.2 (H) 04/26/2019   ESRSEDRATE 39 (H) 04/26/2019         Note: Above Lab results reviewed.  Recent Imaging Review  MR LUMBAR SPINE WO CONTRAST CLINICAL DATA:  Osteoarthritis, lumbosacral Low back pain, > 6 wks Lumbar radiculopathy, > 6 wks  EXAM: MRI LUMBAR SPINE WITHOUT CONTRAST  TECHNIQUE: Multiplanar, multisequence MR imaging of the lumbar spine was performed. No intravenous contrast was administered.  COMPARISON:  May 2018  FINDINGS: Segmentation:  Standard.  Alignment:  Levocurvature.  Anteroposterior alignment preserved.  Vertebrae: Stable vertebral body heights. No marrow edema. No suspicious osseous lesion.  Conus medullaris and cauda equina: Conus extends to the L1 level. Conus and cauda equina appear normal.  Paraspinal and other soft tissues: Unremarkable.  Disc levels:  L1-L2:  Trace disc bulge.  No canal or foraminal stenosis.  L2-L3: Trace disc bulge with superimposed small left foraminal protrusion. No canal or foraminal stenosis.  L3-L4: Trace disc bulge with superimposed small left foraminal protrusion. Mild facet arthropathy with ligamentum flavum infolding. No canal stenosis. Minimal left subarticular recess narrowing. No right foraminal stenosis. Minimal left foraminal stenosis.  L4-L5: Moderate  facet arthropathy with ligamentum flavum infolding. Facet synovial cyst is no longer present. No canal or foraminal stenosis.  L5-S1:  Moderate facet arthropathy.  No canal or foraminal stenosis.  IMPRESSION: Overall mild degenerative changes as detailed above without progression since 2018 examination. Lower lumbar facet arthropathy.  Electronically Signed   By: Macy Mis M.D.   On: 06/05/2021 15:05 Note: Reviewed        Physical Exam  General appearance: Well nourished, well developed, and well hydrated. In no apparent acute distress Mental status: Alert, oriented x 3 (person, place, & time)       Respiratory: No evidence of acute respiratory distress Eyes: PERLA Vitals: BP (!) 119/54 (BP Location: Right Arm, Patient Position: Sitting, Cuff Size: Normal)   Pulse 79   Temp (!) 96.8 F (36 C) (Temporal)   Ht '5\' 3"'$  (1.6 m)   Wt 165 lb (74.8 kg)   SpO2 100%   BMI 29.23 kg/m  BMI: Estimated body mass index is 29.23 kg/m as calculated from the  following:   Height as of this encounter: '5\' 3"'$  (1.6 m).   Weight as of this encounter: 165 lb (74.8 kg). Ideal: Ideal body weight: 52.4 kg (115 lb 8.3 oz) Adjusted ideal body weight: 61.4 kg (135 lb 5 oz)  Assessment   Diagnosis Status  1. Chronic pain syndrome   2. Fibromyalgia   3. Pharmacologic therapy   4. Chronic neck pain (4th area of Pain) (Bilateral) (R>L)   5. Cervical facet syndrome (Bilateral) (R>L)   6. Lumbar facet joint pain   7. Encounter for medication management   8. Encounter for chronic pain management   9. Chronic upper extremity pain (5th area of Pain) (Bilateral) (R>L)   10. Chronic use of opiate for therapeutic purpose   11. Chronic lower extremity pain (2ry area of Pain) (Bilateral) (L>R)   12. Chronic low back pain (1ry area of Pain) (Bilateral) w/o sciatica   13. Chronic hip pain (3ry area of Pain) (Bilateral)    Controlled Controlled Controlled   Updated Problems: No problems updated.  Plan  of Care  Problem-specific:  No problem-specific Assessment & Plan notes found for this encounter.  Zoe Sanchez has a current medication list which includes the following long-term medication(s): albuterol, amitriptyline, dicyclomine, estradiol, fluticasone, [START ON 11/23/2022] hydrocodone-acetaminophen, [START ON 12/23/2022] hydrocodone-acetaminophen, [START ON 01/22/2023] hydrocodone-acetaminophen, metoprolol tartrate, montelukast, omeprazole, pregabalin, pregabalin, propranolol er, rosuvastatin, and salmeterol.  Pharmacotherapy (Medications Ordered): Meds ordered this encounter  Medications   HYDROcodone-acetaminophen (NORCO/VICODIN) 5-325 MG tablet    Sig: Take 1 tablet by mouth every 4 (four) hours as needed for severe pain. Must last 30 days    Dispense:  180 tablet    Refill:  0    DO NOT: delete (not duplicate); no partial-fill (will deny script to complete), no refill request (F/U required). DISPENSE: 1 day early if closed on fill date. WARN: No CNS-depressants within 8 hrs of med.   HYDROcodone-acetaminophen (NORCO/VICODIN) 5-325 MG tablet    Sig: Take 1 tablet by mouth every 4 (four) hours as needed for severe pain. Must last 30 days    Dispense:  180 tablet    Refill:  0    DO NOT: delete (not duplicate); no partial-fill (will deny script to complete), no refill request (F/U required). DISPENSE: 1 day early if closed on fill date. WARN: No CNS-depressants within 8 hrs of med.   HYDROcodone-acetaminophen (NORCO/VICODIN) 5-325 MG tablet    Sig: Take 1 tablet by mouth every 4 (four) hours as needed for severe pain. Must last 30 days    Dispense:  180 tablet    Refill:  0    DO NOT: delete (not duplicate); no partial-fill (will deny script to complete), no refill request (F/U required). DISPENSE: 1 day early if closed on fill date. WARN: No CNS-depressants within 8 hrs of med.   Orders:  Orders Placed This Encounter  Procedures   DG HIP UNILAT W OR W/O PELVIS 2-3 VIEWS RIGHT     Please describe any evidence of DJD, such as joint narrowing, asymmetry, cysts, or any anomalies in bone density, production, or erosion.    Standing Status:   Future    Standing Expiration Date:   12/22/2022    Scheduling Instructions:     Please make sure that the patient understands that this needs to be done as soon as possible. Never have the patient do the imaging "just before the next appointment". Inform patient that having the imaging done within the [facility]  Network will expedite the availability of the results and will provide      imaging availability to the requesting physician. In addition inform the patient that the imaging order has an expiration date and will not be renewed if not done within the active period.    Order Specific Question:   Reason for Exam (SYMPTOM  OR DIAGNOSIS REQUIRED)    Answer:   Right hip pain/arthralgia    Order Specific Question:   Preferred imaging location?    Answer:   [facility]    Order Specific Question:   Call Results- Best Contact Number?    Answer:   [phone] ([facility])    Order Specific Question:   Release to patient    Answer:   Immediate   DG HIP UNILAT W OR W/O PELVIS 2-3 VIEWS LEFT    Please describe any evidence of DJD, such as joint narrowing, asymmetry, cysts, or any anomalies in bone density, production, or erosion.    Standing Status:   Future    Standing Expiration Date:   12/22/2022    Scheduling Instructions:     Please make sure that the patient understands that this needs to be done as soon as possible. Never have the patient do the imaging "just before the next appointment". Inform patient that having the imaging done within the [facility] will expedite the availability of the results and will provide      imaging availability to the requesting physician. In addition inform the patient that the imaging order has an expiration date and will not be renewed if not done within the active period.    Order Specific  Question:   Reason for Exam (SYMPTOM  OR DIAGNOSIS REQUIRED)    Answer:   Right hip pain/arthralgia    Order Specific Question:   Preferred imaging location?    Answer:   [facility]    Order Specific Question:   Call Results- Best Contact Number?    Answer:   [phone] ([facility])    Order Specific Question:   Release to patient    Answer:   Immediate   MR LUMBAR SPINE WO CONTRAST    Patient presents with axial pain with possible radicular component. Please assist [place] in identifying specific level(s) and laterality of any additional findings such as: 1. Facet (Zygapophyseal) joint DJD (Hypertrophy, space narrowing, subchondral sclerosis, and/or osteophyte formation) 2. DDD and/or IVDD (Loss of disc height, desiccation, gas patterns, osteophytes, endplate sclerosis, or "Black disc disease") 3. Pars defects 4. Spondylolisthesis, spondylosis, and/or spondyloarthropathies (include Degree/Grade of displacement in mm) (stability) 5. Vertebral body Fractures (acute/chronic) (state percentage of collapse) 6. Demineralization (osteopenia/osteoporotic) 7. Bone pathology 8. Foraminal narrowing  9. Surgical changes 10. Central, Lateral Recess, and/or Foraminal Stenosis (include AP diameter of stenosis in mm) 11. Surgical changes (hardware type, status, and presence of fibrosis) 12. Modic Type Changes (MRI only) 13. IVDD (Disc bulge, protrusion, herniation, extrusion) (Level, laterality, extent)    Standing Status:   Future    Standing Expiration Date:   12/22/2022    Scheduling Instructions:     Please make sure that the patient understands that this needs to be done as soon as possible. Never have the patient do the imaging "just before the next appointment". Inform patient that having the imaging done within the [facility] will expedite the availability of the results and will provide      imaging availability to the requesting physician. In addition inform the patient  that the  imaging order has an expiration date and will not be renewed if not done within the active period.    Order Specific Question:   What is the patient's sedation requirement?    Answer:   No Sedation    Order Specific Question:   Does the patient have a pacemaker or implanted devices?    Answer:   No    Order Specific Question:   Preferred imaging location?    Answer:   [facility] (table limit-350lbs)    Order Specific Question:   Call Results- Best Contact Number?    Answer:   [phone] ([facility])    Order Specific Question:   Radiology Contrast Protocol - do NOT remove file path    Answer:   \\[email or web address]   DG Lumbar Spine Complete W/Bend    Patient presents with axial pain with possible radicular component. Please assist [place] in identifying specific level(s) and laterality of any additional findings such as: 1. Facet (Zygapophyseal) joint DJD (Hypertrophy, space narrowing, subchondral sclerosis, and/or osteophyte formation) 2. DDD and/or IVDD (Loss of disc height, desiccation, gas patterns, osteophytes, endplate sclerosis, or "Black disc disease") 3. Pars defects 4. Spondylolisthesis, spondylosis, and/or spondyloarthropathies (include Degree/Grade of displacement in mm) (stability) 5. Vertebral body Fractures (acute/chronic) (state percentage of collapse) 6. Demineralization (osteopenia/osteoporotic) 7. Bone pathology 8. Foraminal narrowing  9. Surgical changes    Standing Status:   Future    Standing Expiration Date:   12/22/2022    Scheduling Instructions:     Please make sure that the patient understands that this needs to be done as soon as possible. Never have the patient do the imaging "just before the next appointment". Inform patient that having the imaging done within the [facility] will expedite the availability of the results and will provide      imaging availability to the requesting physician. In addition inform the  patient that the imaging order has an expiration date and will not be renewed if not done within the active period.    Order Specific Question:   Reason for Exam (SYMPTOM  OR DIAGNOSIS REQUIRED)    Answer:   Low back pain    Order Specific Question:   Preferred imaging location?    Answer:   [facility]    Order Specific Question:   Call Results- Best Contact Number?    Answer:   [phone] ([facility])    Order Specific Question:   Radiology Contrast Protocol - do NOT remove file path    Answer:   \\[email or web address]    Order Specific Question:   Release to patient    Answer:   Immediate   Follow-up plan:   Return in about 3 months (around 02/21/2023) for Eval-day (M,W), (F2F), (MM).      Interventional Therapies  Risk  Complexity Considerations:   Estimated body mass index is 29.23 kg/m as calculated from the following:   Height as of this encounter: '5\' 3"'$  (1.6 m).   Weight as of this encounter: 165 lb (74.8 kg). WNL   Planned  Pending:   Repeat x-rays of the lumbar spine with flexion and extension views.   Under consideration:   Diagnostic/therapeutic bilateral lumbar facet MBB #3  Diagnostic bilateral cervical facet MBB  Diagnostic right CESI  Diagnostic bilateral IA shoulder joint injection  Diagnostic bilateral suprascapular NB  Possible bilateral lumbar facet RFA  Diagnostic left carpal tunnel injection    Completed:   Diagnostic bilateral lumbar facet  MBB x2 (w/o steroids) (04/20/2017) (100/60/60/<25)    Therapeutic  Palliative (PRN) options:   none   Pharmacotherapy  Nonopioids transferred 08/24/2020: Magnesium, Flexeril, Lyrica, melatonin, vitamin B12, and vitamin D3.       Recent Visits No visits were found meeting these conditions. Showing recent visits within past 90 days and meeting all other requirements Today's Visits Date Type Provider Dept  11/21/22 Office Visit Milinda Pointer, MD  [facility]  Showing today's visits and meeting all other requirements Future Appointments No visits were found meeting these conditions. Showing future appointments within next 90 days and meeting all other requirements  I discussed the assessment and treatment plan with the patient. The patient was provided an opportunity to ask questions and all were answered. The patient agreed with the plan and demonstrated an understanding of the instructions.  Patient advised to call back or seek an in-person evaluation if the symptoms or condition worsens.  Duration of encounter: 30 minutes.  Total time on encounter, as per AMA guidelines included both the face-to-face and non-face-to-face time personally spent by the physician and/or other qualified health care professional(s) on the day of the encounter (includes time in activities that require the physician or other qualified health care professional and does not include time in activities normally performed by clinical staff). Physician's time may include the following activities when performed: Preparing to see the patient (e.g., pre-charting review of records, searching for previously ordered imaging, lab work, and nerve conduction tests) Review of prior analgesic pharmacotherapies. Reviewing PMP Interpreting ordered tests (e.g., lab work, imaging, nerve conduction tests) Performing post-procedure evaluations, including interpretation of diagnostic procedures Obtaining and/or reviewing separately obtained history Performing a medically appropriate examination and/or evaluation Counseling and educating the patient/family/caregiver Ordering medications, tests, or procedures Referring and communicating with other health care professionals (when not separately reported) Documenting clinical information in the electronic or other health record Independently interpreting results (not separately reported) and communicating results to the  patient/ family/caregiver Care coordination (not separately reported)  Note by: Gaspar Cola, MD Date: 11/21/2022; Time: 2:47 PM

## 2022-11-21 NOTE — Patient Instructions (Signed)
____________________________________________________________________________________________  Opioid Pain Medication Update  To: All patients taking opioid pain medications. (I.e.: hydrocodone, hydromorphone, oxycodone, oxymorphone, morphine, codeine, methadone, tapentadol, tramadol, buprenorphine, fentanyl, etc.)  Re: Updated review of side effects and adverse reactions of opioid analgesics, as well as new information about long term effects of this class of medications.  Direct risks of long-term opioid therapy are not limited to opioid addiction and overdose. Potential medical risks include serious fractures, breathing problems during sleep, hyperalgesia, immunosuppression, chronic constipation, bowel obstruction, myocardial infarction, and tooth decay secondary to xerostomia.  Unpredictable adverse effects that can occur even if you take your medication correctly: Cognitive impairment, respiratory depression, and death. Most people think that if they take their medication "correctly", and "as instructed", that they will be safe. Nothing could be farther from the truth. In reality, a significant amount of recorded deaths associated with the use of opioids has occurred in individuals that had taken the medication for a long time, and were taking their medication correctly. The following are examples of how this can happen: Patient taking his/her medication for a long time, as instructed, without any side effects, is given a certain antibiotic or another unrelated medication, which in turn triggers a "Drug-to-drug interaction" leading to disorientation, cognitive impairment, impaired reflexes, respiratory depression or an untoward event leading to serious bodily harm or injury, including death.  Patient taking his/her medication for a long time, as instructed, without any side effects, develops an acute impairment of liver and/or kidney function. This will lead to a rapid inability of the body to  breakdown and eliminate their pain medication, which will result in effects similar to an "overdose", but with the same medicine and dose that they had always taken. This again may lead to disorientation, cognitive impairment, impaired reflexes, respiratory depression or an untoward event leading to serious bodily harm or injury, including death.  A similar problem will occur with patients as they grow older and their liver and kidney function begins to decrease as part of the aging process.  Background information: Historically, the original case for using long-term opioid therapy to treat chronic noncancer pain was based on safety assumptions that subsequent experience has called into question. In 1996, the American Pain Society and the [place] [employer] issued a consensus statement supporting long-term opioid therapy. This statement acknowledged the dangers of opioid prescribing but concluded that the risk for addiction was low; respiratory depression induced by opioids was short-lived, occurred mainly in opioid-naive patients, and was antagonized by pain; tolerance was not a common problem; and efforts to control diversion should not constrain opioid prescribing. This has now proven to be wrong. Experience regarding the risks for opioid addiction, misuse, and overdose in community practice has failed to support these assumptions.  According to the Centers for Disease Control and Prevention, fatal overdoses involving opioid analgesics have increased sharply over the past decade. Currently, more than 96,700 people die from drug overdoses every year. Opioids are a factor in 7 out of every 10 overdose deaths. Deaths from drug overdose have surpassed motor vehicle accidents as the leading cause of death for individuals between the ages of 3 and 36.  Clinical data suggest that neuroendocrine dysfunction may be very common in both men and women, potentially causing hypogonadism, erectile  dysfunction, infertility, decreased libido, osteoporosis, and depression. Recent studies linked higher opioid dose to increased opioid-related mortality. Controlled observational studies reported that long-term opioid therapy may be associated with increased risk for cardiovascular events. Subsequent meta-analysis concluded  that the safety of long-term opioid therapy in elderly patients has not been proven.   Side Effects and adverse reactions: Common side effects: Drowsiness (sedation). Dizziness. Nausea and vomiting. Constipation. Physical dependence -- Dependence often manifests with withdrawal symptoms when opioids are discontinued or decreased. Tolerance -- As you take repeated doses of opioids, you require increased medication to experience the same effect of pain relief. Respiratory depression -- This can occur in healthy people, especially with higher doses. However, people with COPD, asthma or other lung conditions may be even more susceptible to fatal respiratory impairment.  Uncommon side effects: An increased sensitivity to feeling pain and extreme response to pain (hyperalgesia). Chronic use of opioids can lead to this. Delayed gastric emptying (the process by which the contents of your stomach are moved into your small intestine). Muscle rigidity. Immune system and hormonal dysfunction. Quick, involuntary muscle jerks (myoclonus). Arrhythmia. Itchy skin (pruritus). Dry mouth (xerostomia).  Long-term side effects: Chronic constipation. Sleep-disordered breathing (SDB). Increased risk of bone fractures. Hypothalamic-pituitary-adrenal dysregulation. Increased risk of overdose.  RISKS: Fractures and Falls:  Opioids increase the risk and incidence of falls. This is of particular importance in elderly patients.  Endocrine System:  Long-term administration is associated with endocrine abnormalities (endocrinopathies). (Also known as Opioid-induced Endocrinopathy) Influences  on both the hypothalamic-pituitary-adrenal axis?and the hypothalamic-pituitary-gonadal axis have been demonstrated with consequent hypogonadism and adrenal insufficiency in both sexes. Hypogonadism and decreased levels of dehydroepiandrosterone sulfate have been reported in men and women. Endocrine effects include: Amenorrhoea in women (abnormal absence of menstruation) Reduced libido in both sexes Decreased sexual function Erectile dysfunction in men Hypogonadisms (decreased testicular function with shrinkage of testicles) Infertility Depression and fatigue Loss of muscle mass Anxiety Depression Immune suppression Hyperalgesia Weight gain Anemia Osteoporosis Patients (particularly women of childbearing age) should avoid opioids. There is insufficient evidence to recommend routine monitoring of asymptomatic patients taking opioids in the long-term for hormonal deficiencies.  Immune System: Human studies have demonstrated that opioids have an immunomodulating effect. These effects are mediated via opioid receptors both on immune effector cells and in the central nervous system. Opioids have been demonstrated to have adverse effects on antimicrobial response and anti-tumour surveillance. Buprenorphine has been demonstrated to have no impact on immune function.  Opioid Induced Hyperalgesia: Human studies have demonstrated that prolonged use of opioids can lead to a state of abnormal pain sensitivity, sometimes called opioid induced hyperalgesia (OIH). Opioid induced hyperalgesia is not usually seen in the absence of tolerance to opioid analgesia. Clinically, hyperalgesia may be diagnosed if the patient on long-term opioid therapy presents with increased pain. This might be qualitatively and anatomically distinct from pain related to disease progression or to breakthrough pain resulting from development of opioid tolerance. Pain associated with hyperalgesia tends to be more diffuse than the  pre-existing pain and less defined in quality. Management of opioid induced hyperalgesia requires opioid dose reduction.  Cancer: Chronic opioid therapy has been associated with an increased risk of cancer among noncancer patients with chronic pain. This association was more evident in chronic strong opioid users. Chronic opioid consumption causes significant pathological changes in the small intestine and colon. Epidemiological studies have found that there is a link between opium dependence and initiation of gastrointestinal cancers. Cancer is the second leading cause of death after cardiovascular disease. Chronic use of opioids can cause multiple conditions such as GERD, immunosuppression and renal damage as well as carcinogenic effects, which are associated with the incidence of cancers.   Mortality: Long-term opioid use  has been associated with increased mortality among patients with chronic non-cancer pain (CNCP).  Prescription of long-acting opioids for chronic noncancer pain was associated with a significantly increased risk of all-cause mortality, including deaths from causes other than overdose.  Reference: Von Korff M, Kolodny A, Deyo RA, Chou R. Long-term opioid therapy reconsidered. Ann Intern Med. 2011 Sep 6;155(5):325-8. doi: 10.7326/[id number]. PMID: [id number]; PMCIDXX:1631110. Morley Kos, Hayward RA, Dunn KM, [place] KP. Risk of adverse events in patients prescribed long-term opioids: A cohort study in the [place] Clinical Practice Research D[employer]. Eur J Pain. 2019 May;23(5):908-922. doi: 10.1002/ejp.1357. Epub 2019 Jan 31. PMID: [id number]. Colameco S, Coren JS, Ciervo CA. Continuous opioid treatment for chronic noncancer pain: a time for moderation in prescribing. Postgrad Med. 2009 Jul;121(4):61-6. doi: 10.3810/pgm.2009.07.2032. PMID: [id number]. Heywood Bene RN, [place] SD, Blazina I, Rosalio Loud, Bougatsos C, Deyo RA. The  effectiveness and risks of long-term opioid therapy for chronic pain: a systematic review for a [employer] of Health Pathways to [employer]. Ann Intern Med. 2015 Feb 17;162(4):276-86. doi: [id number]. PMID: [id number]. Marjory Sneddon [facility], [place] DM. NCHS Data Brief No. 22. [place]: Centers for Disease Control and Prevention; 2009. Sep, Increase in Fatal Poisonings Involving Opioid Analgesics in the [place], 1999-2006. Song IA, Choi HR, Oh TK. Long-term opioid use and mortality in patients with chronic non-cancer pain: Ten-year follow-up study in [place] from 2010 through 2019. EClinicalMedicine. 2022 Jul 18;51:101558. doi: 10.1016/j.eclinm.2022.UB:[id number]. PMID: [id number]; PMCIDOX:8550940. Huser, W., Schubert, T., Vogelmann, T. et al. All-cause mortality in patients with long-term opioid therapy compared with non-opioid analgesics for chronic non-cancer pain: a database study. [place] Med 18, 162 (2020). [email or web address] Rashidian H, Roxy Cedar, Malekzadeh R, Haghdoost AA. An Ecological Study of the Association between Opiate Use and Incidence of Cancers. Addict Health. 2016 Fall;8(4):252-260. PMID: [id number]; PMCIDQI:9185013.  Our Goal: Our goal is to control your pain with means other than the use of opioid pain medications.  Our Recommendation: Talk to your physician about coming off of these medications. We can assist you with the tapering down and stopping these medicines. Based on the new information, even if you cannot completely stop the medication, a decrease in the dose may be associated with a lesser risk. Ask for other means of controlling the pain. Decrease or eliminate those factors that significantly contribute to your pain such as smoking, obesity, and a diet heavily tilted towards "inflammatory" nutrients.  Last Updated: 11/09/2022    ____________________________________________________________________________________________     ____________________________________________________________________________________________  Patient Information update  To: All of our patients.  Re: Name change.  It has been made official that our current name, "[facility]"   will soon be changed to "[facility]".   The purpose of this change is to eliminate any confusion created by the concept of our practice being a "Medication Management Pain Clinic". In the past this has led to the misconception that we treat pain primarily by the use of prescription medications.  Nothing can be farther from the truth.   Understanding PAIN MANAGEMENT: To further understand what our practice does, you first have to understand that "Pain Management" is a subspecialty that requires additional training once a physician has completed their specialty training, which can be in either Anesthesia, Neurology, Psychiatry, or Physical Medicine and Rehabilitation (PMR). Each one of these contributes to the final approach taken by each physician to  the management of their patient's pain. To be a "Pain Management Specialist" you must have first completed one of the specialty trainings below.  Anesthesiologists - trained in clinical pharmacology and interventional techniques such as nerve blockade and regional as well as central neuroanatomy. They are trained to block pain before, during, and after surgical interventions.  Neurologists - trained in the diagnosis and pharmacological treatment of complex neurological conditions, such as Multiple Sclerosis, Parkinson's, spinal cord injuries, and other systemic conditions that may be associated with symptoms that may include but are not limited to pain. They tend to rely primarily on the treatment of chronic pain  using prescription medications.  Psychiatrist - trained in conditions affecting the psychosocial wellbeing of patients including but not limited to depression, anxiety, schizophrenia, personality disorders, addiction, and other substance use disorders that may be associated with chronic pain. They tend to rely primarily on the treatment of chronic pain using prescription medications.   Physical Medicine and Rehabilitation (PMR) physicians, also known as physiatrists - trained to treat a wide variety of medical conditions affecting the brain, spinal cord, nerves, bones, joints, ligaments, muscles, and tendons. Their training is primarily aimed at treating patients that have suffered injuries that have caused severe physical impairment. Their training is primarily aimed at the physical therapy and rehabilitation of those patients. They may also work alongside orthopedic surgeons or neurosurgeons using their expertise in assisting surgical patients to recover after their surgeries.  INTERVENTIONAL PAIN MANAGEMENT is sub-subspecialty of Pain Management.  Our physicians are Board-certified in Anesthesia, Pain Management, and Interventional Pain Management.  This meaning that not only have they been trained and Board-certified in their specialty of Anesthesia, and subspecialty of Pain Management, but they have also received further training in the sub-subspecialty of Interventional Pain Management, in order to become Board-certified as INTERVENTIONAL PAIN MANAGEMENT SPECIALIST.    Mission: Our goal is to use our skills in  [place] as alternatives to the chronic use of prescription opioid medications for the treatment of pain. To make this more clear, we have changed our name to reflect what we do and offer. We will continue to offer medication management assessment and recommendations, but we will not be taking over any patient's medication  management.  ____________________________________________________________________________________________     ____________________________________________________________________________________________  National Pain Medication Shortage  The [place] is experiencing worsening drug shortages. These have had a negative widespread effect on patient care and treatment. Not expected to improve any time soon. Predicted to last past 2029.   Drug shortage list (generic names) Oxycodone IR Oxycodone/APAP Oxymorphone IR Hydromorphone Hydrocodone/APAP Morphine  Where is the problem?  Manufacturing and supply level.  Will this shortage affect you?  Only if you take any of the above pain medications.  How? You may be unable to fill your prescription.  Your pharmacist may offer a "partial fill" of your prescription. (Warning: Do not accept partial fills.) Prescriptions partially filled cannot be transferred to another pharmacy. Read our Medication Rules and Regulation. Depending on how much medicine you are dependent on, you may experience withdrawals when unable to get the medication.  Recommendations: Consider ending your dependence on opioid pain medications. Ask your pain specialist to assist you with the process. Consider switching to a medication currently not in shortage, such as Buprenorphine. Talk to your pain specialist about this option. Consider decreasing your pain medication requirements by managing tolerance thru "Drug Holidays". This may help minimize withdrawals, should you run out of medicine. Control your pain thru  the use of non-pharmacological interventional therapies.   Your prescriber: Prescribers cannot be blamed for shortages. Medication manufacturing and supply issues cannot be fixed by the prescriber.   NOTE: The prescriber is not responsible for supplying the medication, or solving supply issues. Work with your pharmacist to solve it. The patient is responsible for  the decision to take or continue taking the medication and for identifying and securing a legal supply source. By law, supplying the medication is the job and responsibility of the pharmacy. The prescriber is responsible for the evaluation, monitoring, and prescribing of these medications.   Prescribers will NOT: Re-issue prescriptions that have been partially filled. Re-issue prescriptions already sent to a pharmacy.  Re-send prescriptions to a different pharmacy because yours did not have your medication. Ask pharmacist to order more medicine or transfer the prescription to another pharmacy. (Read below.)  New 2023 regulation: "May 13, 2022 Revised Regulation Allows DEA-Registered Pharmacies to Transfer Electronic Prescriptions at a Patient's Request [place] Patients now have the ability to request their electronic prescription be transferred to another pharmacy without having to go back to their practitioner to initiate the request. This revised regulation went into effect on Monday, May 09, 2022.     At a patient's request, a DEA-registered retail pharmacy can now transfer an electronic prescription for a controlled substance (schedules II-V) to another DEA-registered retail pharmacy. Prior to this change, patients would have to go through their practitioner to cancel their prescription and have it re-issued to a different pharmacy. The process was taxing and time consuming for both patients and practitioners.    The Drug Enforcement Administration [facility]) published its intent to revise the process for transferring electronic prescriptions on July 31, 2020.  The final rule was published in the federal register on April 07, 2022 and went into effect 30 days later.  Under the final rule, a prescription can only be transferred once between pharmacies, and only if allowed under existing state or other applicable law. The prescription must  remain in its electronic form; may not be altered in any way; and the transfer must be communicated directly between two licensed pharmacists. It's important to note, any authorized refills transfer with the original prescription, which means the entire prescription will be filled at the same pharmacy".  Reference: [email or web address] [facility] website announcement)  [email or web address].pdf ([place])   [employer] / Vol. 88, No. 143 / Thursday, April 07, 2022 / Rules and Regulations DEPARTMENT OF JUSTICE  Drug Enforcement Administration  21 CFR Part 1306  [Docket No. [id number]]  RIN [id number] Transfer of Electronic Prescriptions for Schedules II-V Controlled Substances Between Pharmacies for Initial Filling  ____________________________________________________________________________________________     ____________________________________________________________________________________________  Transfer of Pain Medication between Pharmacies  Re: 2023 DEA Clarification on existing regulation  Published on DEA Website: May 13, 2022  Title: Revised Regulation Allows DEA-Registered Pharmacies to Conservator, museum/gallery Prescriptions at a Patient's Request [place]  "Patients now have the ability to request their electronic prescription be transferred to another pharmacy without having to go back to their practitioner to initiate the request. This revised regulation went into effect on Monday, May 09, 2022.     At a patient's request, a DEA-registered retail pharmacy can now transfer an electronic prescription for a controlled substance (schedules II-V) to another DEA-registered retail pharmacy. Prior to this change, patients would have to go through their practitioner to  cancel their prescription  and have it re-issued to a different pharmacy. The process was taxing and time consuming for both patients and practitioners.    The Drug Enforcement Administration [facility]) published its intent to revise the process for transferring electronic prescriptions on July 31, 2020.  The final rule was published in the federal register on April 07, 2022 and went into effect 30 days later.  Under the final rule, a prescription can only be transferred once between pharmacies, and only if allowed under existing state or other applicable law. The prescription must remain in its electronic form; may not be altered in any way; and the transfer must be communicated directly between two licensed pharmacists. It's important to note, any authorized refills transfer with the original prescription, which means the entire prescription will be filled at the same pharmacy."    REFERENCES: 1. DEA website announcement [email or web address]  2. Department of Justice website  [email or web address]  3. DEPARTMENT OF JUSTICE Drug Enforcement Administration 21 CFR Part 1306 [Docket No. [id number]] RIN [id number] "Transfer of Electronic Prescriptions for Schedules II-V Controlled Substances Between Pharmacies for Initial Filling"  ____________________________________________________________________________________________     _______________________________________________________________________  Medication Rules  Purpose: To inform patients, and their family members, of our medication rules and regulations.  Applies to: All patients receiving prescriptions from our practice (written or electronic).  Pharmacy of record: This is the pharmacy where your electronic prescriptions will be sent. Make sure we have the correct one.  Electronic prescriptions: In  compliance with the [place] (STOP) Act of 2017 (Session Lanny Cramp [phone]), effective September 12, 2018, all controlled substances must be electronically prescribed. Written prescriptions, faxing, or calling prescriptions to a pharmacy will no longer be done.  Prescription refills: These will be provided only during in-person appointments. No medications will be renewed without a "face-to-face" evaluation with your provider. Applies to all prescriptions.  NOTE: The following applies primarily to controlled substances (Opioid* Pain Medications).   Type of encounter (visit): For patients receiving controlled substances, face-to-face visits are required. (Not an option and not up to the patient.)  Patient's responsibilities: Pain Pills: Bring all pain pills to every appointment (except for procedure appointments). Pill Bottles: Bring pills in original pharmacy bottle. Bring bottle, even if empty. Always bring the bottle of the most recent fill.  Medication refills: You are responsible for knowing and keeping track of what medications you are taking and when is it that you will need a refill. The day before your appointment: write a list of all prescriptions that need to be refilled. The day of the appointment: give the list to the admitting nurse. Prescriptions will be written only during appointments. No prescriptions will be written on procedure days. If you forget a medication: it will not be "Called in", "Faxed", or "electronically sent". You will need to get another appointment to get these prescribed. No early refills. Do not call asking to have your prescription filled early. Partial  or short prescriptions: Occasionally your pharmacy may not have enough pills to fill your prescription.  NEVER ACCEPT a partial fill or a prescription that is short of the total amount of pills that you were prescribed.  With controlled substances the law allows 72 hours for  the pharmacy to complete the prescription.  If the prescription is not completed within 72 hours, the pharmacist will require a new prescription to be written. This means that you will be short on your medicine and we WILL NOT send another prescription to complete your original  prescription.  Instead, request the pharmacy to send a carrier to a nearby branch to get enough medication to provide you with your full prescription. Prescription Accuracy: You are responsible for carefully inspecting your prescriptions before leaving our office. Have the discharge nurse carefully go over each prescription with you, before taking them home. Make sure that your name is accurately spelled, that your address is correct. Check the name and dose of your medication to make sure it is accurate. Check the number of pills, and the written instructions to make sure they are clear and accurate. Make sure that you are given enough medication to last until your next medication refill appointment. Taking Medication: Take medication as prescribed. When it comes to controlled substances, taking less pills or less frequently than prescribed is permitted and encouraged. Never take more pills than instructed. Never take the medication more frequently than prescribed.  Inform other Doctors: Always inform, all of your healthcare providers, of all the medications you take. Pain Medication from other Providers: You are not allowed to accept any additional pain medication from any other Doctor or Healthcare provider. There are two exceptions to this rule. (see below) In the event that you require additional pain medication, you are responsible for notifying us, as stated below. Cough Medicine: Often these contain an opioid, such as codeine or hydrocodone. Never accept or take cough medicine containing these opioids if you are already taking an opioid* medication. The combination may cause respiratory failure and death. Medication Agreement:  You are responsible for carefully reading and following our Medication Agreement. This must be signed before receiving any prescriptions from our practice. Safely store a copy of your signed Agreement. Violations to the Agreement will result in no further prescriptions. (Additional copies of our Medication Agreement are available upon request.) Laws, Rules, & Regulations: All patients are expected to follow all Federal and [employer], [employer], Rules, [facility]. Ignorance of the Laws does not constitute a valid excuse.  Illegal drugs and Controlled Substances: The use of illegal substances (including, but not limited to marijuana and its derivatives) and/or the illegal use of any controlled substances is strictly prohibited. Violation of this rule may result in the immediate and permanent discontinuation of any and all prescriptions being written by our practice. The use of any illegal substances is prohibited. Adopted CDC guidelines & recommendations: Target dosing levels will be at or below 60 MME/day. Use of benzodiazepines** is not recommended.  Exceptions: There are only two exceptions to the rule of not receiving pain medications from other Healthcare Providers. Exception #1 (Emergencies): In the event of an emergency (i.e.: accident requiring emergency care), you are allowed to receive additional pain medication. However, you are responsible for: As soon as you are able, call our office [phone], at any time of the day or night, and leave a message stating your name, the date and nature of the emergency, and the name and dose of the medication prescribed. In the event that your call is answered by a member of our staff, make sure to document and save the date, time, and the name of the person that took your information.  Exception #2 (Planned Surgery): In the event that you are scheduled by another doctor or dentist to have any type of surgery or procedure, you are allowed (for a period no  longer than 30 days), to receive additional pain medication, for the acute post-op pain. However, in this case, you are responsible for picking up a copy of  our "Post-op Pain Management for Surgeons" handout, and giving it to your surgeon or dentist. This document is available at our office, and does not require an appointment to obtain it. Simply go to our office during business hours (Monday-Thursday from 8:00 AM to 4:00 PM) (Friday 8:00 AM to 12:00 Noon) or if you have a scheduled appointment with [place], prior to your surgery, and ask for it by name. In addition, you are responsible for: calling our office [phone], at any time of the day or night, and leaving a message stating your name, name of your surgeon, type of surgery, and date of procedure or surgery. Failure to comply with your responsibilities may result in termination of therapy involving the controlled substances. Medication Agreement Violation. Following the above rules, including your responsibilities will help you in avoiding a Medication Agreement Violation ("Breaking your Pain Medication Contract").  Consequences:  Not following the above rules may result in permanent discontinuation of medication prescription therapy.  *Opioid medications include: morphine, codeine, oxycodone, oxymorphone, hydrocodone, hydromorphone, meperidine, tramadol, tapentadol, buprenorphine, fentanyl, methadone. **Benzodiazepine medications include: diazepam (Valium), alprazolam (Xanax), clonazepam (Klonopine), lorazepam (Ativan), clorazepate (Tranxene), chlordiazepoxide (Librium), estazolam (Prosom), oxazepam (Serax), temazepam (Restoril), triazolam (Halcion) (Last updated: 07/05/2022) ______________________________________________________________________    ______________________________________________________________________  Medication Recommendations and Reminders  Applies to: All patients receiving prescriptions (written and/or  electronic).  Medication Rules & Regulations: You are responsible for reading, knowing, and following our "Medication Rules" document. These exist for your safety and that of others. They are not flexible and neither are we. Dismissing or ignoring them is an act of "non-compliance" that may result in complete and irreversible termination of such medication therapy. For safety reasons, "non-compliance" will not be tolerated. As with the [place] fundamental legal principle of "ignorance of the law is no defense", we will accept no excuses for not having read and knowing the content of documents provided to you by our practice.  Pharmacy of record:  Definition: This is the pharmacy where your electronic prescriptions will be sent.  We do not endorse any particular pharmacy. It is up to you and your insurance to decide what pharmacy to use.  We do not restrict you in your choice of pharmacy. However, once we write for your prescriptions, we will NOT be re-sending more prescriptions to fix restricted supply problems created by your pharmacy, or your insurance.  The pharmacy listed in the electronic medical record should be the one where you want electronic prescriptions to be sent. If you choose to change pharmacy, simply notify our nursing staff. Changes will be made only during your regular appointments and not over the phone.  Recommendations: Keep all of your pain medications in a safe place, under lock and key, even if you live alone. We will NOT replace lost, stolen, or damaged medication. We do not accept "Police Reports" as proof of medications having been stolen. After you fill your prescription, take 1 week's worth of pills and put them away in a safe place. You should keep a separate, properly labeled bottle for this purpose. The remainder should be kept in the original bottle. Use this as your primary supply, until it runs out. Once it's gone, then you know that you have 1 week's worth of medicine,  and it is time to come in for a prescription refill. If you do this correctly, it is unlikely that you will ever run out of medicine. To make sure that the above recommendation works, it is very important that you make  sure your medication refill appointments are scheduled at least 1 week before you run out of medicine. To do this in an effective manner, make sure that you do not leave the office without scheduling your next medication management appointment. Always ask the nursing staff to show you in your prescription , when your medication will be running out. Then arrange for the receptionist to get you a return appointment, at least 7 days before you run out of medicine. Do not wait until you have 1 or 2 pills left, to come in. This is very poor planning and does not take into consideration that we may need to cancel appointments due to bad weather, sickness, or emergencies affecting our staff. DO NOT ACCEPT A "Partial Fill": If for any reason your pharmacy does not have enough pills/tablets to completely fill or refill your prescription, do not allow for a "partial fill". The law allows the pharmacy to complete that prescription within 72 hours, without requiring a new prescription. If they do not fill the rest of your prescription within those 72 hours, you will need a separate prescription to fill the remaining amount, which we will NOT provide. If the reason for the partial fill is your insurance, you will need to talk to the pharmacist about payment alternatives for the remaining tablets, but again, DO NOT ACCEPT A PARTIAL FILL, unless you can trust your pharmacist to obtain the remainder of the pills within 72 hours.  Prescription refills and/or changes in medication(s):  Prescription refills, and/or changes in dose or medication, will be conducted only during scheduled medication management appointments. (Applies to both, written and electronic prescriptions.) No refills on procedure days. No  medication will be changed or started on procedure days. No changes, adjustments, and/or refills will be conducted on a procedure day. Doing so will interfere with the diagnostic portion of the procedure. No phone refills. No medications will be "called into the pharmacy". No Fax refills. No weekend refills. No Holliday refills. No after hours refills.  Remember:  Business hours are:  Monday to Thursday 8:00 AM to 4:00 PM Provider's Schedule: Milinda Pointer, MD - Appointments are:  Medication management: Monday and Wednesday 8:00 AM to 4:00 PM Procedure day: Tuesday and Thursday 7:30 AM to 4:00 PM Gillis Santa, MD - Appointments are:  Medication management: Tuesday and Thursday 8:00 AM to 4:00 PM Procedure day: Monday and Wednesday 7:30 AM to 4:00 PM (Last update: 07/05/2022) ______________________________________________________________________    ____________________________________________________________________________________________  Drug Holidays  What is a "Drug Holiday"? Drug Holiday: is the name given to the process of slowly tapering down and temporarily stopping the pain medication for the purpose of decreasing or eliminating tolerance to the drug.  Benefits Improved effectiveness Decreased required effective dose Improved pain control End dependence on high dose therapy Decrease cost of therapy Uncovering "opioid-induced hyperalgesia". (OIH)  What is "opioid hyperalgesia"? It is a paradoxical increase in pain caused by exposure to opioids. Stopping the opioid pain medication, contrary to the expected, it actually decreases or completely eliminates the pain. Ref.: "A comprehensive review of opioid-induced hyperalgesia". Brion Aliment, et.al. Pain Physician. 2011 Mar-Apr;14(2):145-61.  What is tolerance? Tolerance: the progressive loss of effectiveness of a pain medicine due to repetitive use. A common problem of opioid pain medications.  How long should a "Drug  Holiday" last? Effectiveness depends on the patient staying off all opioid pain medicines for a minimum of 14 consecutive days. (2 weeks)  How about just taking less of the medicine? Does not  work. Will not accomplish goal of eliminating the excess receptors.  How about switching to a different pain medicine? (AKA. "Opioid rotation") Does not work. Creates the illusion of effectiveness by taking advantage of inaccurate equivalent dose calculations between different opioids. -This "technique" was promoted by studies funded by [employer], such as [employer], creators of "OxyContin".  Can I stop the medicine "cold [place]"? Depends. You should always coordinate with your Pain Specialist to make the transition as smoothly as possible. Avoid stopping the medicine abruptly without consulting. We recommend a "slow taper".  What is a slow taper? Taper: refers to the gradual decrease in dose.   How do I stop/taper the dose? Slowly. Decrease the daily amount of pills that you take by one (1) pill every seven (7) days. This is called a "slow downward taper". Example: if you normally take four (4) pills per day, drop it to three (3) pills per day for seven (7) days, then to two (2) pills per day for seven (7) days, then to one (1) per day for seven (7) days, and then stop the medicine. The 14 day "Drug Holiday" starts on the first day without medicine.   Will I experience withdrawals? Unlikely with a slow taper.  What triggers withdrawals? Withdrawals are triggered by the sudden/abrupt stop of high dose opioids. Withdrawals can be avoided by slowly decreasing the dose over a prolonged period of time.  What are withdrawals? Symptoms associated with sudden/abrupt reduction/stopping of high-dose, long-term use of pain medication. Withdrawal are seldom seen on low dose therapy, or patients rarely taking opioid medication.  Early Withdrawal Symptoms may include: Agitation Anxiety Muscle  aches Increased tearing Insomnia Runny nose Sweating Yawning  Late symptoms may include: Abdominal cramping Diarrhea Dilated pupils Goose bumps Nausea Vomiting  (Last update: 08/21/2022) ____________________________________________________________________________________________    ____________________________________________________________________________________________  WARNING: CBD (cannabidiol) & Delta (Delta-8 tetrahydrocannabinol) products.   Applicable to:  All individuals currently taking or considering taking CBD (cannabidiol) and, more important, all patients taking opioid analgesic controlled substances (pain medication). (Example: oxycodone; oxymorphone; hydrocodone; hydromorphone; morphine; methadone; tramadol; tapentadol; fentanyl; buprenorphine; butorphanol; dextromethorphan; meperidine; codeine; etc.)  Introduction:  Recently there has been a drive towards the use of "natural" products for the treatment of different conditions, including pain anxiety and sleep disorders. Marijuana and hemp are two varieties of the cannabis genus plants. Marijuana and its derivatives are illegal, while hemp and its derivatives are not. Cannabidiol (CBD) and tetrahydrocannabinol (THC), are two natural compounds found in plants of the Cannabis genus. They can both be extracted from hemp or marijuana. Both compounds interact with your body's endocannabinoid system in very different ways. CBD is associated with pain relief (analgesia) while THC is associated with the psychoactive effects ("the high") obtained from the use of marijuana products. There are two main types of THC: Delta-9, which comes from the marijuana plant and it is illegal, and Delta-8, which comes from the hemp plant, and it is legal. (Both, Delta-9-THC and Delta-8-THC are psychoactive and give you "the high".)   Legality:  Marijuana and its derivatives: illegal Hemp and its derivatives: Legal (State dependent) UPDATE:  (10/29/2021) The Drug Enforcement Agency ([place]) issued a letter stating that "delta" cannabinoids, including Delta-8-THCO and Delta-9-THCO, synthetically derived from hemp do not qualify as hemp and will be viewed as Schedule I drugs. (Schedule I drugs, substances, or chemicals are defined as drugs with no currently accepted medical use and a high potential for abuse. Some examples of Schedule I drugs are: heroin,  lysergic acid diethylamide (LSD), marijuana (cannabis), 3,4-methylenedioxymethamphetamine (ecstasy), methaqualone, and peyote.) (https://jennings.com/)  Legal status of CBD in [place]:  "Conditionally Legal"  Reference: "FDA Regulation of Cannabis and Cannabis-Derived Products, Including Cannabidiol (CBD)" - SeekArt[employer].com.pt  Warning:  CBD is not FDA approved and has not undergo the same manufacturing controls as prescription drugs.  This means that the purity and safety of available CBD may be questionable. Most of the time, despite manufacturer's claims, it is contaminated with THC (delta-9-tetrahydrocannabinol - the chemical in marijuana responsible for the "HIGH").  When this is the case, the [facility] contaminant will trigger a positive urine drug screen (UDS) test for Marijuana (carboxy-THC).   The FDA recently put out a warning about 5 things that everyone should be aware of regarding Delta-8 THC: Delta-8 THC products have not been evaluated or approved by the FDA for safe use and may be marketed in ways that put the public health at risk. The FDA has received adverse event reports involving delta-8 THC-containing products. Delta-8 THC has psychoactive and intoxicating effects. Delta-8 THC manufacturing often involve use of potentially harmful chemicals to create the concentrations of delta-8 THC claimed in the marketplace. The final delta-8 THC product may have potentially harmful  by-products (contaminants) due to the chemicals used in the process. Manufacturing of delta-8 THC products may occur in uncontrolled or unsanitary settings, which may lead to the presence of unsafe contaminants or other potentially harmful substances. Delta-8 THC products should be kept out of the reach of children and pets.  NOTE: Because a positive UDS for any illicit substance is a violation of our medication agreement, your opioid analgesics (pain medicine) may be permanently discontinued.  MORE ABOUT CBD  General Information: CBD was discovered in [birth year] and it is a derivative of the cannabis sativa genus plants (Marijuana and Hemp). It is one of the 113 identified substances found in Marijuana. It accounts for up to 40% of the plant's extract. As of 2018, preliminary clinical studies on CBD included research for the treatment of anxiety, movement disorders, and pain. CBD is available and consumed in multiple forms, including inhalation of smoke or vapor, as an aerosol spray, and by mouth. It may be supplied as an oil containing CBD, capsules, dried cannabis, or as a liquid solution. CBD is thought not to be as psychoactive as THC (delta-9-tetrahydrocannabinol - the chemical in marijuana responsible for the "HIGH"). Studies suggest that CBD may interact with different biological target receptors in the body, including cannabinoid and other neurotransmitter receptors. As of 2018 the mechanism of action for its biological effects has not been determined.  Side-effects  Adverse reactions: Dry mouth, diarrhea, decreased appetite, fatigue, drowsiness, malaise, weakness, sleep disturbances, and others.  Drug interactions:  CBD may interact with medications such as blood-thinners. CBD causes drowsiness on its own and it will increase drowsiness caused by other medications, including antihistamines (such as Benadryl), benzodiazepines (Xanax, Ativan, Valium), antipsychotics, antidepressants, opioids, alcohol  and supplements such as kava, melatonin and St. John's Wort.  Other drug interactions: Brivaracetam (Briviact); Caffeine; Carbamazepine (Tegretol); Citalopram (Celexa); Clobazam (Onfi); Eslicarbazepine (Aptiom); Everolimus (Zostress); Lithium; Methadone (Dolophine); Rufinamide (Banzel); Sedative medications (CNS depressants); Sirolimus (Rapamune); Stiripentol (Diacomit); Tacrolimus (Prograf); Tamoxifen ; Soltamox); Topiramate (Topamax); Valproate; Warfarin (Coumadin); Zonisamide. (Last update: 08/22/2022) ____________________________________________________________________________________________   ____________________________________________________________________________________________  Naloxone Nasal Spray  Why am I receiving this medication? [place] STOP ACT requires that all patients taking high dose opioids or at risk of opioids respiratory depression, be prescribed an opioid reversal agent, such as  Naloxone (AKA: Narcan).  What is this medication? NALOXONE (nal OX one) treats opioid overdose, which causes slow or shallow breathing, severe drowsiness, or trouble staying awake. Call emergency services after using this medication. You may need additional treatment. Naloxone works by reversing the effects of opioids. It belongs to a group of medications called opioid blockers.  COMMON BRAND NAME(S): Kloxxado, Narcan  What should I tell my care team before I take this medication? They need to know if you have any of these conditions: Heart disease Substance use disorder An unusual or allergic reaction to naloxone, other medications, foods, dyes, or preservatives Pregnant or trying to get pregnant Breast-feeding  When to use this medication? This medication is to be used for the treatment of respiratory depression (less than 8 breaths per minute) secondary to opioid overdose.   How to use this medication? This medication is for use in the nose. Lay the person on their back.  Support their neck with your hand and allow the head to tilt back before giving the medication. The nasal spray should be given into 1 nostril. After giving the medication, move the person onto their side. Do not remove or test the nasal spray until ready to use. Get emergency medical help right away after giving the first dose of this medication, even if the person wakes up. You should be familiar with how to recognize the signs and symptoms of a narcotic overdose. If more doses are needed, give the additional dose in the other nostril. Talk to your care team about the use of this medication in children. While this medication may be prescribed for children as young as newborns for selected conditions, precautions do apply.  Naloxone Overdosage: If you think you have taken too much of this medicine contact a poison control center or emergency room at once.  NOTE: This medicine is only for you. Do not share this medicine with others.  What if I miss a dose? This does not apply.  What may interact with this medication? This is only used during an emergency. No interactions are expected during emergency use. This list may not describe all possible interactions. Give your health care provider a list of all the medicines, herbs, non-prescription drugs, or dietary supplements you use. Also tell them if you smoke, drink alcohol, or use illegal drugs. Some items may interact with your medicine.  What should I watch for while using this medication? Keep this medication ready for use in the case of an opioid overdose. Make sure that you have the phone number of your care team and local hospital ready. You may need to have additional doses of this medication. Each nasal spray contains a single dose. Some emergencies may require additional doses. After use, bring the treated person to the nearest hospital or call 911. Make sure the treating care team knows that the person has received a dose of this medication.  You will receive additional instructions on what to do during and after use of this medication before an emergency occurs.  What side effects may I notice from receiving this medication? Side effects that you should report to your care team as soon as possible: Allergic reactions--skin rash, itching, hives, swelling of the face, lips, tongue, or throat Side effects that usually do not require medical attention (report these to your care team if they continue or are bothersome): Constipation Dryness or irritation inside the nose Headache Increase in blood pressure Muscle spasms Stuffy nose Toothache This list may not  describe all possible side effects. Call your doctor for medical advice about side effects. You may report side effects to FDA at 1-800-FDA-1088.  Where should I keep my medication? Because this is an emergency medication, you should keep it with you at all times.  Keep out of the reach of children and pets. Store between 20 and 25 degrees C (68 and 77 degrees F). Do not freeze. Throw away any unused medication after the expiration date. Keep in original box until ready to use.  NOTE: This sheet is a summary. It may not cover all possible information. If you have questions about this medicine, talk to your doctor, pharmacist, or health care provider.   2023 [employer]/Gold Standard (2021-05-07 00:00:00)  ____________________________________________________________________________________________

## 2022-11-21 NOTE — Progress Notes (Signed)
Nursing Pain Medication Assessment:  Safety precautions to be maintained throughout the outpatient stay will include: orient to surroundings, keep bed in low position, maintain call bell within reach at all times, provide assistance with transfer out of bed and ambulation.  Medication Inspection Compliance: Pill count conducted under aseptic conditions, in front of the patient. Neither the pills nor the bottle was removed from the patient's sight at any time. Once count was completed pills were immediately returned to the patient in their original bottle.  Medication: Hydrocodone/APAP Pill/Patch Count:  85 of 180 pills remain Pill/Patch Appearance: Markings consistent with prescribed medication Bottle Appearance: Standard pharmacy container. Clearly labeled. Filled Date: 02 / 12 / 2024 Last Medication intake:  Today

## 2022-12-01 ENCOUNTER — Ambulatory Visit: Admission: RE | Admit: 2022-12-01 | Payer: 59 | Source: Ambulatory Visit

## 2022-12-22 ENCOUNTER — Ambulatory Visit
Admission: RE | Admit: 2022-12-22 | Discharge: 2022-12-22 | Disposition: A | Discharge status: Home / Self Care | Payer: 59 | Source: Ambulatory Visit | Attending: Pain Medicine | Admitting: Pain Medicine

## 2022-12-22 ENCOUNTER — Other Ambulatory Visit: Payer: 59

## 2022-12-22 DIAGNOSIS — M79604 Pain in right leg: Secondary | ICD-10-CM | POA: Insufficient documentation

## 2022-12-22 DIAGNOSIS — M545 Low back pain, unspecified: Secondary | ICD-10-CM | POA: Insufficient documentation

## 2022-12-22 DIAGNOSIS — M25551 Pain in right hip: Secondary | ICD-10-CM | POA: Diagnosis present

## 2022-12-22 DIAGNOSIS — M25552 Pain in left hip: Secondary | ICD-10-CM | POA: Insufficient documentation

## 2022-12-22 DIAGNOSIS — G8929 Other chronic pain: Secondary | ICD-10-CM | POA: Diagnosis present

## 2022-12-22 DIAGNOSIS — M79605 Pain in left leg: Secondary | ICD-10-CM | POA: Diagnosis present

## 2022-12-22 DIAGNOSIS — M5459 Other low back pain: Secondary | ICD-10-CM

## 2023-02-15 ENCOUNTER — Ambulatory Visit: Payer: 59 | Attending: Pain Medicine | Admitting: Pain Medicine

## 2023-02-15 ENCOUNTER — Encounter: Payer: Self-pay | Admitting: Pain Medicine

## 2023-02-15 VITALS — BP 128/63 | HR 82 | Temp 97.8°F | Resp 16 | Ht 63.0 in | Wt 165.0 lb

## 2023-02-15 DIAGNOSIS — M79605 Pain in left leg: Secondary | ICD-10-CM | POA: Diagnosis present

## 2023-02-15 DIAGNOSIS — M79602 Pain in left arm: Secondary | ICD-10-CM

## 2023-02-15 DIAGNOSIS — M25552 Pain in left hip: Secondary | ICD-10-CM | POA: Diagnosis present

## 2023-02-15 DIAGNOSIS — G894 Chronic pain syndrome: Secondary | ICD-10-CM

## 2023-02-15 DIAGNOSIS — Z79891 Long term (current) use of opiate analgesic: Secondary | ICD-10-CM

## 2023-02-15 DIAGNOSIS — M79604 Pain in right leg: Secondary | ICD-10-CM | POA: Diagnosis present

## 2023-02-15 DIAGNOSIS — M25551 Pain in right hip: Secondary | ICD-10-CM | POA: Diagnosis present

## 2023-02-15 DIAGNOSIS — G8929 Other chronic pain: Secondary | ICD-10-CM | POA: Diagnosis present

## 2023-02-15 DIAGNOSIS — M47812 Spondylosis without myelopathy or radiculopathy, cervical region: Secondary | ICD-10-CM | POA: Diagnosis not present

## 2023-02-15 DIAGNOSIS — M797 Fibromyalgia: Secondary | ICD-10-CM

## 2023-02-15 DIAGNOSIS — M542 Cervicalgia: Secondary | ICD-10-CM | POA: Insufficient documentation

## 2023-02-15 DIAGNOSIS — M5459 Other low back pain: Secondary | ICD-10-CM

## 2023-02-15 DIAGNOSIS — M79601 Pain in right arm: Secondary | ICD-10-CM

## 2023-02-15 DIAGNOSIS — Z79899 Other long term (current) drug therapy: Secondary | ICD-10-CM

## 2023-02-15 DIAGNOSIS — M545 Low back pain, unspecified: Secondary | ICD-10-CM | POA: Diagnosis present

## 2023-02-15 MED ORDER — NALOXONE HCL 4 MG/0.1ML NA LIQD
1.0000 | NASAL | 0 refills | Status: AC | PRN
Start: 2023-02-15 — End: 2024-02-15

## 2023-02-15 MED ORDER — HYDROCODONE-ACETAMINOPHEN 5-325 MG PO TABS: 1.0000 | ORAL_TABLET | ORAL | 0 refills | Status: DC | PRN

## 2023-02-15 NOTE — Progress Notes (Signed)
Nursing Pain Medication Assessment:  Safety precautions to be maintained throughout the outpatient stay will include: orient to surroundings, keep bed in low position, maintain call bell within reach at all times, provide assistance with transfer out of bed and ambulation.  Medication Inspection Compliance: Pill count conducted under aseptic conditions, in front of the patient. Neither the pills nor the bottle was removed from the patient's sight at any time. Once count was completed pills were immediately returned to the patient in their original bottle.  Medication: Hydrocodone/APAP Pill/Patch Count:  111 of 180 pills remain Pill/Patch Appearance: Markings consistent with prescribed medication Bottle Appearance: Standard pharmacy container. Clearly labeled. Filled Date: 05 / 17 / 2024 Last Medication intake:  Today

## 2023-02-15 NOTE — Patient Instructions (Signed)
____________________________________________________________________________________________  Naloxone Nasal Spray  Why am I receiving this medication? [place] STOP ACT requires that all patients taking high dose opioids or at risk of opioids respiratory depression, be prescribed an opioid reversal agent, such as Naloxone (AKA: Narcan).  What is this medication? NALOXONE (nal OX one) treats opioid overdose, which causes slow or shallow breathing, severe drowsiness, or trouble staying awake. Call emergency services after using this medication. You may need additional treatment. Naloxone works by reversing the effects of opioids. It belongs to a group of medications called opioid blockers.  COMMON BRAND NAME(S): Kloxxado, Narcan  What should I tell my care team before I take this medication? They need to know if you have any of these conditions: Heart disease Substance use disorder An unusual or allergic reaction to naloxone, other medications, foods, dyes, or preservatives Pregnant or trying to get pregnant Breast-feeding  When to use this medication? This medication is to be used for the treatment of respiratory depression (less than 8 breaths per minute) secondary to opioid overdose.   How to use this medication? This medication is for use in the nose. Lay the person on their back. Support their neck with your hand and allow the head to tilt back before giving the medication. The nasal spray should be given into 1 nostril. After giving the medication, move the person onto their side. Do not remove or test the nasal spray until ready to use. Get emergency medical help right away after giving the first dose of this medication, even if the person wakes up. You should be familiar with how to recognize the signs and symptoms of a narcotic overdose. If more doses are needed, give the additional dose in the other nostril. Talk to your care team about the use of this medication in children.  While this medication may be prescribed for children as young as newborns for selected conditions, precautions do apply.  Naloxone Overdosage: If you think you have taken too much of this medicine contact a poison control center or emergency room at once.  NOTE: This medicine is only for you. Do not share this medicine with others.  What if I miss a dose? This does not apply.  What may interact with this medication? This is only used during an emergency. No interactions are expected during emergency use. This list may not describe all possible interactions. Give your health care provider a list of all the medicines, herbs, non-prescription drugs, or dietary supplements you use. Also tell them if you smoke, drink alcohol, or use illegal drugs. Some items may interact with your medicine.  What should I watch for while using this medication? Keep this medication ready for use in the case of an opioid overdose. Make sure that you have the phone number of your care team and local hospital ready. You may need to have additional doses of this medication. Each nasal spray contains a single dose. Some emergencies may require additional doses. After use, bring the treated person to the nearest hospital or call 911. Make sure the treating care team knows that the person has received a dose of this medication. You will receive additional instructions on what to do during and after use of this medication before an emergency occurs.  What side effects may I notice from receiving this medication? Side effects that you should report to your care team as soon as possible: Allergic reactions--skin rash, itching, hives, swelling of the face, lips, tongue, or throat Side effects that usually  do not require medical attention (report these to your care team if they continue or are bothersome): Constipation Dryness or irritation inside the nose Headache Increase in blood pressure Muscle spasms Stuffy  nose Toothache This list may not describe all possible side effects. Call your doctor for medical advice about side effects. You may report side effects to FDA at 1-800-FDA-1088.  Where should I keep my medication? Because this is an emergency medication, you should keep it with you at all times.  Keep out of the reach of children and pets. Store between 20 and 25 degrees C (68 and 77 degrees F). Do not freeze. Throw away any unused medication after the expiration date. Keep in original box until ready to use.  NOTE: This sheet is a summary. It may not cover all possible information. If you have questions about this medicine, talk to your doctor, pharmacist, or health care provider.   2023 Elsevier/Gold Standard (2021-05-07 00:00:00)  ____________________________________________________________________________________________   ____________________________________________________________________________________________  Transfer of Pain Medication between Pharmacies  Re: 2023 DEA Clarification on existing regulation  Published on DEA Website: May 13, 2022  Title: Revised Regulation Allows DEA-Registered Pharmacies to Electrical engineer Prescriptions at a Patient's Request DEA Headquarters Division - [employer]  "Patients now have the ability to request their electronic prescription be transferred to another pharmacy without having to go back to their practitioner to initiate the request. This revised regulation went into effect on Monday, May 09, 2022.     At a patient's request, a DEA-registered retail pharmacy can now transfer an electronic prescription for a controlled substance (schedules II-V) to another DEA-registered retail pharmacy. Prior to this change, patients would have to go through their practitioner to cancel their prescription and have it re-issued to a different pharmacy. The process was taxing and time consuming for both patients and  practitioners.    The Drug Enforcement Administration [facility]) published its intent to revise the process for transferring electronic prescriptions on July 31, 2020.  The final rule was published in the federal register on April 07, 2022 and went into effect 30 days later.  Under the final rule, a prescription can only be transferred once between pharmacies, and only if allowed under existing state or other applicable law. The prescription must remain in its electronic form; may not be altered in any way; and the transfer must be communicated directly between two licensed pharmacists. It's important to note, any authorized refills transfer with the original prescription, which means the entire prescription will be filled at the same pharmacy."    REFERENCES: 1. DEA website announcement HugeHand.is  2. Department of Justice website  CheapWipes.at.pdf  3. DEPARTMENT OF JUSTICE Drug Enforcement Administration 21 CFR Part 1306 [Docket No. [id number]-637] RIN [id number]-[id number] "Transfer of Electronic Prescriptions for Schedules II-V Controlled Substances Between Pharmacies for Initial Filling"  ____________________________________________________________________________________________     ____________________________________________________________________________________________  Patient Information update  To: All of our patients.  Re: Name change.  It has been made official that our current name, "[facility]"   will soon be changed to "[facility]".   The purpose of this change is to eliminate any confusion created by the concept of our practice being a "Medication Management Pain Clinic". In the past this has led to the misconception that  we treat pain primarily by the use of prescription medications.  Nothing can be farther from the truth.   Understanding PAIN MANAGEMENT: To further  understand what our practice does, you first have to understand that "Pain Management" is a subspecialty that requires additional training once a physician has completed their specialty training, which can be in either Anesthesia, Neurology, Psychiatry, or Physical Medicine and Rehabilitation (PMR). Each one of these contributes to the final approach taken by each physician to the management of their patient's pain. To be a "Pain Management Specialist" you must have first completed one of the specialty trainings below.  Anesthesiologists - trained in clinical pharmacology and interventional techniques such as nerve blockade and regional as well as central neuroanatomy. They are trained to block pain before, during, and after surgical interventions.  Neurologists - trained in the diagnosis and pharmacological treatment of complex neurological conditions, such as Multiple Sclerosis, Parkinson's, spinal cord injuries, and other systemic conditions that may be associated with symptoms that may include but are not limited to pain. They tend to rely primarily on the treatment of chronic pain using prescription medications.  Psychiatrist - trained in conditions affecting the psychosocial wellbeing of patients including but not limited to depression, anxiety, schizophrenia, personality disorders, addiction, and other substance use disorders that may be associated with chronic pain. They tend to rely primarily on the treatment of chronic pain using prescription medications.   Physical Medicine and Rehabilitation (PMR) physicians, also known as physiatrists - trained to treat a wide variety of medical conditions affecting the brain, spinal cord, nerves, bones, joints, ligaments, muscles, and tendons. Their training is primarily aimed at treating patients that have  suffered injuries that have caused severe physical impairment. Their training is primarily aimed at the physical therapy and rehabilitation of those patients. They may also work alongside orthopedic surgeons or neurosurgeons using their expertise in assisting surgical patients to recover after their surgeries.  INTERVENTIONAL PAIN MANAGEMENT is sub-subspecialty of Pain Management.  Our physicians are Board-certified in Anesthesia, Pain Management, and Interventional Pain Management.  This meaning that not only have they been trained and Board-certified in their specialty of Anesthesia, and subspecialty of Pain Management, but they have also received further training in the sub-subspecialty of Interventional Pain Management, in order to become Board-certified as INTERVENTIONAL PAIN MANAGEMENT SPECIALIST.    Mission: Our goal is to use our skills in  INTERVENTIONAL PAIN MANAGEMENT as alternatives to the chronic use of prescription opioid medications for the treatment of pain. To make this more clear, we have changed our name to reflect what we do and offer. We will continue to offer medication management assessment and recommendations, but we will not be taking over any patient's medication management.  ____________________________________________________________________________________________   ____________________________________________________________________________________________  Opioid Pain Medication Update  To: All patients taking opioid pain medications. (I.e.: hydrocodone, hydromorphone, oxycodone, oxymorphone, morphine, codeine, methadone, tapentadol, tramadol, buprenorphine, fentanyl, etc.)  Re: Updated review of side effects and adverse reactions of opioid analgesics, as well as new information about long term effects of this class of medications.  Direct risks of long-term opioid therapy are not limited to opioid addiction and overdose. Potential medical risks include serious  fractures, breathing problems during sleep, hyperalgesia, immunosuppression, chronic constipation, bowel obstruction, myocardial infarction, and tooth decay secondary to xerostomia.  Unpredictable adverse effects that can occur even if you take your medication correctly: Cognitive impairment, respiratory depression, and death. Most people think that if they take their medication "correctly", and "as instructed", that they will be safe. Nothing could be farther from the truth. In reality, a significant amount of recorded deaths associated with the use of opioids has occurred  in individuals that had taken the medication for a long time, and were taking their medication correctly. The following are examples of how this can happen: Patient taking his/her medication for a long time, as instructed, without any side effects, is given a certain antibiotic or another unrelated medication, which in turn triggers a "Drug-to-drug interaction" leading to disorientation, cognitive impairment, impaired reflexes, respiratory depression or an untoward event leading to serious bodily harm or injury, including death.  Patient taking his/her medication for a long time, as instructed, without any side effects, develops an acute impairment of liver and/or kidney function. This will lead to a rapid inability of the body to breakdown and eliminate their pain medication, which will result in effects similar to an "overdose", but with the same medicine and dose that they had always taken. This again may lead to disorientation, cognitive impairment, impaired reflexes, respiratory depression or an untoward event leading to serious bodily harm or injury, including death.  A similar problem will occur with patients as they grow older and their liver and kidney function begins to decrease as part of the aging process.  Background information: Historically, the original case for using long-term opioid therapy to treat chronic noncancer  pain was based on safety assumptions that subsequent experience has called into question. In 1996, the American Pain Society and the American Academy of Pain Medicine issued a consensus statement supporting long-term opioid therapy. This statement acknowledged the dangers of opioid prescribing but concluded that the risk for addiction was low; respiratory depression induced by opioids was short-lived, occurred mainly in opioid-naive patients, and was antagonized by pain; tolerance was not a common problem; and efforts to control diversion should not constrain opioid prescribing. This has now proven to be wrong. Experience regarding the risks for opioid addiction, misuse, and overdose in community practice has failed to support these assumptions.  According to the Centers for Disease Control and Prevention, fatal overdoses involving opioid analgesics have increased sharply over the past decade. Currently, more than 96,700 people die from drug overdoses every year. Opioids are a factor in 7 out of every 10 overdose deaths. Deaths from drug overdose have surpassed motor vehicle accidents as the leading cause of death for individuals between the ages of 51 and 80.  Clinical data suggest that neuroendocrine dysfunction may be very common in both men and women, potentially causing hypogonadism, erectile dysfunction, infertility, decreased libido, osteoporosis, and depression. Recent studies linked higher opioid dose to increased opioid-related mortality. Controlled observational studies reported that long-term opioid therapy may be associated with increased risk for cardiovascular events. Subsequent meta-analysis concluded that the safety of long-term opioid therapy in elderly patients has not been proven.   Side Effects and adverse reactions: Common side effects: Drowsiness (sedation). Dizziness. Nausea and vomiting. Constipation. Physical dependence -- Dependence often manifests with withdrawal symptoms when  opioids are discontinued or decreased. Tolerance -- As you take repeated doses of opioids, you require increased medication to experience the same effect of pain relief. Respiratory depression -- This can occur in healthy people, especially with higher doses. However, people with COPD, asthma or other lung conditions may be even more susceptible to fatal respiratory impairment.  Uncommon side effects: An increased sensitivity to feeling pain and extreme response to pain (hyperalgesia). Chronic use of opioids can lead to this. Delayed gastric emptying (the process by which the contents of your stomach are moved into your small intestine). Muscle rigidity. Immune system and hormonal dysfunction. Quick, involuntary muscle jerks (myoclonus). Arrhythmia. Itchy  skin (pruritus). Dry mouth (xerostomia).  Long-term side effects: Chronic constipation. Sleep-disordered breathing (SDB). Increased risk of bone fractures. Hypothalamic-pituitary-adrenal dysregulation. Increased risk of overdose.  RISKS: Fractures and Falls:  Opioids increase the risk and incidence of falls. This is of particular importance in elderly patients.  Endocrine System:  Long-term administration is associated with endocrine abnormalities (endocrinopathies). (Also known as Opioid-induced Endocrinopathy) Influences on both the hypothalamic-pituitary-adrenal axis?and the hypothalamic-pituitary-gonadal axis have been demonstrated with consequent hypogonadism and adrenal insufficiency in both sexes. Hypogonadism and decreased levels of dehydroepiandrosterone sulfate have been reported in men and women. Endocrine effects include: Amenorrhoea in women (abnormal absence of menstruation) Reduced libido in both sexes Decreased sexual function Erectile dysfunction in men Hypogonadisms (decreased testicular function with shrinkage of testicles) Infertility Depression and fatigue Loss of muscle mass Anxiety Depression Immune  suppression Hyperalgesia Weight gain Anemia Osteoporosis Patients (particularly women of childbearing age) should avoid opioids. There is insufficient evidence to recommend routine monitoring of asymptomatic patients taking opioids in the long-term for hormonal deficiencies.  Immune System: Human studies have demonstrated that opioids have an immunomodulating effect. These effects are mediated via opioid receptors both on immune effector cells and in the central nervous system. Opioids have been demonstrated to have adverse effects on antimicrobial response and anti-tumour surveillance. Buprenorphine has been demonstrated to have no impact on immune function.  Opioid Induced Hyperalgesia: Human studies have demonstrated that prolonged use of opioids can lead to a state of abnormal pain sensitivity, sometimes called opioid induced hyperalgesia (OIH). Opioid induced hyperalgesia is not usually seen in the absence of tolerance to opioid analgesia. Clinically, hyperalgesia may be diagnosed if the patient on long-term opioid therapy presents with increased pain. This might be qualitatively and anatomically distinct from pain related to disease progression or to breakthrough pain resulting from development of opioid tolerance. Pain associated with hyperalgesia tends to be more diffuse than the pre-existing pain and less defined in quality. Management of opioid induced hyperalgesia requires opioid dose reduction.  Cancer: Chronic opioid therapy has been associated with an increased risk of cancer among noncancer patients with chronic pain. This association was more evident in chronic strong opioid users. Chronic opioid consumption causes significant pathological changes in the small intestine and colon. Epidemiological studies have found that there is a link between opium dependence and initiation of gastrointestinal cancers. Cancer is the second leading cause of death after cardiovascular disease.  Chronic use of opioids can cause multiple conditions such as GERD, immunosuppression and renal damage as well as carcinogenic effects, which are associated with the incidence of cancers.   Mortality: Long-term opioid use has been associated with increased mortality among patients with chronic non-cancer pain (CNCP).  Prescription of long-acting opioids for chronic noncancer pain was associated with a significantly increased risk of all-cause mortality, including deaths from causes other than overdose.  Reference: Von Korff M, Kolodny A, Deyo RA, Chou R. Long-term opioid therapy reconsidered. Ann Intern Med. 2011 Sep 6;155(5):325-8. doi: 10.7326/[id number]. PMID: 09811914; PMCID: [id number]. Randon Goldsmith, Hayward RA, Dunn KM, [place] KP. Risk of adverse events in patients prescribed long-term opioids: A cohort study in the [place] [facility] [employer]. Eur J Pain. 2019 May;23(5):908-922. doi: 10.1002/ejp.1357. Epub 2019 Jan 31. PMID: 86578469. Colameco S, Coren JS, Ciervo CA. Continuous opioid treatment for chronic noncancer pain: a time for moderation in prescribing. Postgrad Med. 2009 Jul;121(4):61-6. doi: 10.3810/pgm.2009.07.2032. PMID: 62952841. William Hamburger RN, Stites SD, Joanna Puff, Bougatsos C, [place]  RA. The effectiveness and risks of long-term opioid therapy for chronic pain: a systematic review for a [employer] of [employer] Pathways to [employer]. Ann Intern Med. 2015 Feb 17;162(4):276-86. doi: 10.7326/M14-2559. PMID: 16109604. Caryl Bis [facility], [place] DM. NCHS Data Brief No. 22. [place]: Centers for Disease Control and Prevention; 2009. Sep, Increase in Fatal Poisonings Involving Opioid Analgesics in the [place], 1999-2006. Song IA, Choi HR, Oh TK. Long-term opioid use and mortality in patients with chronic non-cancer pain: Ten-year follow-up study in [place] from 2010 through 2019.  EClinicalMedicine. 2022 Jul 18;51:101558. doi: 10.1016/j.eclinm.2022.540981. PMID: 19147829; PMCID: [id number]. Huser, W., Schubert, T., Vogelmann, T. et al. All-cause mortality in patients with long-term opioid therapy compared with non-opioid analgesics for chronic non-cancer pain: a database study. BMC Med 18, 162 (2020). http://lester.info/ Rashidian H, Karie Kirks, Malekzadeh R, Haghdoost AA. An Ecological Study of the Association between Opiate Use and Incidence of Cancers. Addict Health. 2016 Fall;8(4):252-260. PMID: 84696295; PMCID: [id number].  Our Goal: Our goal is to control your pain with means other than the use of opioid pain medications.  Our Recommendation: Talk to your physician about coming off of these medications. We can assist you with the tapering down and stopping these medicines. Based on the new information, even if you cannot completely stop the medication, a decrease in the dose may be associated with a lesser risk. Ask for other means of controlling the pain. Decrease or eliminate those factors that significantly contribute to your pain such as smoking, obesity, and a diet heavily tilted towards "inflammatory" nutrients.  Last Updated: 11/09/2022   ____________________________________________________________________________________________

## 2023-02-15 NOTE — Progress Notes (Signed)
PROVIDER NOTE: Information contained herein reflects review and annotations entered in association with encounter. Interpretation of such information and data should be left to medically-trained personnel. Information provided to patient can be located elsewhere in the medical record under "Patient Instructions". Document created using STT-dictation technology, any transcriptional errors that may result from process are unintentional.    Patient: Zoe Sanchez  Service Category: E/M  Provider: Oswaldo Done, MD  DOB: 1955-09-20  DOS: 02/15/2023  Referring Provider: Hillery Aldo, MD  MRN: [id number]  Specialty: Interventional Pain Management  PCP: Hillery Aldo, MD  Type: Established Patient  Setting: Ambulatory outpatient    Location: Office  Delivery: Face-to-face     HPI  [name], a 67 y.o. year old female, is here today because of her Chronic pain syndrome [G89.4]. [name] primary complain today is Back Pain and Neck Pain  Pertinent problems: [name] has Myofascial pain syndrome; Cervical facet syndrome (Bilateral) (R>L); Chronic neck pain (4th area of Pain) (Bilateral) (R>L); Chronic pain syndrome; Cervical spondylosis; Fibromyalgia; Cervical herniated disc (C5-6 and C6-7); Cervical foraminal stenosis (Bilateral) (C5-6); Chronic cervical radicular pain (Bilateral) (R>L) (C8 Dermatome); Chronic low back pain (Bilateral) (L>R) w/ sciatica (Bilateral) (L>R); Spondylosis of lumbar spine; Family history of chronic pain; Carpal tunnel syndrome (Left); Chronic shoulder pain (Bilateral); Cervical facet arthropathy; Lumbar facet arthropathy; Bimalleolar fracture of left ankle; Lumbar facet syndrome (Bilateral) (R>L); Chronic upper extremity pain (5th area of Pain) (Bilateral) (R>L); Chronic lower extremity pain (2ry area of Pain) (Bilateral) (L>R); Chronic lumbar radicular pain (Bilateral) (S1); Arthropathy of lumbar facet joint; Cervical syndrome; Lumbar spondylosis; Synovial cyst of  lumbar spine; Prolapsed cervical intervertebral disc; Lumbar facet joint pain; Cervical radiculopathy; Stenosis of intervertebral foramina; Chronic hip pain (3ry area of Pain) (Bilateral); Shoulder pain; Chronic musculoskeletal pain; Neurogenic pain; Closed trimalleolar fracture of left ankle; Bilateral lower extremity edema; Chronic pain of multiple sites; Closed fracture of fifth metatarsal bone; Lumbosacral sensory radiculopathy at S1 (Bilateral); Decreased range of motion of lumbar spine; Numbness and tingling of lower extremity (Bilateral); Chronic low back pain (1ry area of Pain) (Bilateral) w/o sciatica; and Abnormal MRI, lumbar spine (06/05/2021) on their pertinent problem list. Pain Assessment: Severity of Chronic pain is reported as a 2 /10. Location: Back Lower/radiates downt the side of left leg to below the knee. Onset: More than a month ago. Quality: Stabbing, Sharp. Timing: Constant. Modifying factor(s): lying on back with feet propped up. Vitals:  height is 5\' 3"  (1.6 m) and weight is 165 lb (74.8 kg). Her temperature is 97.8 F (36.6 C). Her blood pressure is 128/63 and her pulse is 82. Her respiration is 16 and oxygen saturation is 99%.  BMI: Estimated body mass index is 29.23 kg/m as calculated from the following:   Height as of this encounter: 5\' 3"  (1.6 m).   Weight as of this encounter: 165 lb (74.8 kg). Last encounter: 11/21/2022. Last procedure: Visit date not found.  Reason for encounter: medication management.  The patient indicates doing well with the current medication regimen. No adverse reactions or side effects reported to the medications.  Today I went over the results of her lumbar MRI, lumbar x-ray films, and hip x-rays.  The patient has evidence of an interval development of a left-sided disc extrusion at the L3-4 level with inferior migration of a disc fragment behind the L4 vertebral body associated with mass effect on the left L4 nerve root.  Today I have offered the  patient an epidural  steroid injection with to assist with that pain and discomfort.  She is trying to avoid everything and has indicated that should the pain get any worse, she will give me a call.  I informed the patient that should a series of 3 epidural steroid injections not improve her condition, the neck step would be to consult a neurosurgeon for possible excision of the vertebral body extrusion/fragment.  She understood and indicated that she wants to try to avoid that is much as possible.  Routine UDS ordered today.   RTCB: 05/22/2023   Pharmacotherapy Assessment  Analgesic: Hydrocodone/APAP 5/325, 1 tab PO q 4 hrs (30 mg/day of hydrocodone)(1,950 mg/day of acetaminophen) MME/day: 30 mg/day.   Monitoring: [place] PMP: PDMP reviewed during this encounter.       Pharmacotherapy: No side-effects or adverse reactions reported. Compliance: No problems identified. Effectiveness: Clinically acceptable.  Valerie Salts, RN  02/15/2023  2:07 PM  Sign when Signing Visit Nursing Pain Medication Assessment:  Safety precautions to be maintained throughout the outpatient stay will include: orient to surroundings, keep bed in low position, maintain call bell within reach at all times, provide assistance with transfer out of bed and ambulation.  Medication Inspection Compliance: Pill count conducted under aseptic conditions, in front of the patient. Neither the pills nor the bottle was removed from the patient's sight at any time. Once count was completed pills were immediately returned to the patient in their original bottle.  Medication: Hydrocodone/APAP Pill/Patch Count:  111 of 180 pills remain Pill/Patch Appearance: Markings consistent with prescribed medication Bottle Appearance: Standard pharmacy container. Clearly labeled. Filled Date: 05 / 17 / 2024 Last Medication intake:  Today    No results found for: "CBDTHCR" No results found for: "D8THCCBX" No results found for: "D9THCCBX"  UDS:  Summary   Date Value Ref Range Status  02/16/2022 Note  Final    Comment:    ==================================================================== [employer] Select 13 (MW) ==================================================================== Test                             Result       Flag       Units  Drug Present and Declared for Prescription Verification   Hydrocodone                    6353         EXPECTED   ng/mg creat   Hydromorphone                  284          EXPECTED   ng/mg creat   Dihydrocodeine                 1529         EXPECTED   ng/mg creat   Norhydrocodone                 2081         EXPECTED   ng/mg creat    Sources of hydrocodone include scheduled prescription medications.    Hydromorphone, dihydrocodeine and norhydrocodone are expected    metabolites of hydrocodone. Hydromorphone and dihydrocodeine are    also available as scheduled prescription medications.  Drug Present not Declared for Prescription Verification   Oxazepam                       23  UNEXPECTED ng/mg creat    Oxazepam may be administered as a scheduled prescription medication;    it is also an expected metabolite of other benzodiazepine drugs,    including diazepam, chlordiazepoxide, prazepam, clorazepate,    halazepam, and temazepam.  ==================================================================== Test                      Result    Flag   Units      Ref Range   Creatinine              124              mg/dL      >=40 ==================================================================== Declared Medications:  The flagging and interpretation on this report are based on the  following declared medications.  Unexpected results may arise from  inaccuracies in the declared medications.   **Note: The testing scope of this panel includes these medications:   Hydrocodone (Norco)   **Note: The testing scope of this panel does not include the  following reported medications:    Acetaminophen (Norco)  Albuterol  Amitriptyline (Elavil)  Amoxicillin  Cholecalciferol  Cyanocobalamin  Cyclobenzaprine (Flexeril)  Dicyclomine (Bentyl)  Estradiol (Estrace)  Fluticasone (Flonase)  Magnesium (Magonate)  Melatonin  Metronidazole (Flagyl)  Montelukast  Omeprazole  Pregabalin (Lyrica)  Propranolol (Inderal)  Rosuvastatin (Crestor)  Salmeterol  Topical  Umeclidinium (Incruse Ellipta)  Zolpidem (Ambien) ==================================================================== For clinical consultation, please call [phone]. ====================================================================       ROS  Constitutional: Denies any fever or chills Gastrointestinal: No reported hemesis, hematochezia, vomiting, or acute GI distress Musculoskeletal: Denies any acute onset joint swelling, redness, loss of ROM, or weakness Neurological: No reported episodes of acute onset apraxia, aphasia, dysarthria, agnosia, amnesia, paralysis, loss of coordination, or loss of consciousness  Medication Review  HYDROcodone-acetaminophen, Magnesium Gluconate, albuterol, amitriptyline, betamethasone dipropionate, cyclobenzaprine, dicyclomine, docusate sodium, estradiol, fluticasone, gemfibrozil, metFORMIN, metoprolol tartrate, metroNIDAZOLE, montelukast, naloxone, omeprazole, potassium chloride, pregabalin, propranolol ER, rosuvastatin, salmeterol, senna, umeclidinium bromide, and zolpidem  History Review  Allergy: Ms. Sane is allergic to prednisone. Drug: Ms. Perron  reports no history of drug use. Alcohol:  reports no history of alcohol use. Tobacco:  reports that she has never smoked. She has never used smokeless tobacco. Social: Ms. Barse  reports that she has never smoked. She has never used smokeless tobacco. She reports that she does not drink alcohol and does not use drugs. Medical:  has a past medical history of Abnormal heart rhythm, Abnormal mammogram (02/25/2014),  Actinic keratosis, Anemia, Anxiety, Asthma, Awareness of heartbeats (06/29/2015), Basal cell carcinoma (04/24/2008), Basal cell carcinoma (04/09/2009), Basal cell carcinoma (11/02/2022), Basal cell carcinoma (BCC) (12/23/2014), Breath shortness (06/29/2015), CAD (coronary artery disease), Cancer (HCC), Cervical radicular pain (Location of Secondary source of pain) (Bilateral) (R>L) (C8 Dermatome) (06/29/2015), Chronic pain, COPD (chronic obstructive pulmonary disease) (HCC), DDD (degenerative disc disease), cervical, Degenerative disk disease, Depression, Dysrhythmia, Endometriosis, Family history of chronic pain (06/29/2015), Fibromyalgia, Fibromyalgia, GERD (gastroesophageal reflux disease), Heart murmur, Hiatal hernia, History of attempted suicide (06/29/2015), History of hiatal hernia (06/29/2015), History of psychiatric disorder (06/29/2015), History of suicidal ideation (06/29/2015), Hyperlipidemia, Insomnia, Insomnia, Irritable bowel syndrome, 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 Surgical: Ms. Krenek  has a past surgical history that includes Abdominal hysterectomy (1986); Tonsillectomy (1957); Skin lesion excision; Oophorectomy (1986); Breast surgery; ORIF ankle fracture (Left, 03/14/2018); ORIF ankle fracture (Left, 07/26/2018);  Ankle surgery; Colonoscopy with propofol (N/A, 06/05/2019); Breast biopsy (Right, 2016); Fracture surgery; and Colonoscopy with propofol (N/A, 03/05/2020). Family: family history includes Breast cancer in her maternal aunt; Heart disease in her father; Hypertension in her father and mother.  Laboratory Chemistry Profile   Renal Lab Results  Component Value Date   BUN 21 04/26/2019   CREATININE 0.84 04/26/2019   GFRAA >60 04/26/2019   GFRNONAA >60 04/26/2019    Hepatic Lab Results  Component Value Date   AST 35  04/26/2019   ALT 22 04/26/2019   ALBUMIN 4.1 04/26/2019   ALKPHOS 71 04/26/2019    Electrolytes Lab Results  Component Value Date   NA 135 04/26/2019   K 4.5 04/26/2019   CL 103 04/26/2019   CALCIUM 9.4 04/26/2019   MG 1.8 04/26/2019    Bone Lab Results  Component Value Date   VD25OH 40.8 04/26/2019   25OHVITD1 49 03/09/2016   25OHVITD2 <1.0 03/09/2016   25OHVITD3 48 03/09/2016    Inflammation (CRP: Acute Phase) (ESR: Chronic Phase) Lab Results  Component Value Date   CRP 2.2 (H) 04/26/2019   ESRSEDRATE 39 (H) 04/26/2019         Note: Above Lab results reviewed.  Recent Imaging Review  MR LUMBAR SPINE WO CONTRAST CLINICAL DATA:  Low back pain with bilateral radiculopathy.  EXAM: MRI LUMBAR SPINE WITHOUT CONTRAST  TECHNIQUE: Multiplanar, multisequence MR imaging of the lumbar spine was performed. No intravenous contrast was administered.  COMPARISON:  Lumbar spine radiographs 12/22/2022. Lumbar MRI 06/04/2021.  FINDINGS: Segmentation: Conventional anatomy assumed, with the last open disc space designated L5-S1.Concordant with prior imaging.  Alignment:  Physiologic.  Vertebrae: No worrisome osseous lesion, acute fracture or pars defect. The visualized sacroiliac joints appear unremarkable.  Conus medullaris: Extends to the L1 level and appears normal.  Paraspinal and other soft tissues: No significant paraspinal findings. The common bile duct measures up to 10 mm in diameter with smooth distal tapering, not previously imaged.  Disc levels:  Sagittal images demonstrate no significant disc space findings within the visualized lower thoracic spine.  L1-2: Minimal disc bulging and facet hypertrophy. No spinal stenosis or nerve root encroachment.  L2-3: Mild disc bulging and facet hypertrophy. No spinal stenosis or nerve root encroachment.  L3-4: Mildly progressive loss of disc height with annular disc bulging and interval development of a disc  extrusion in the left subarticular zone. There is inferior migration of a moderate-sized disc fragment behind the L4 vertebral body with associated mass effect on the thecal sac and left L4 nerve root encroachment. There is mild facet and ligamentous hypertrophy. The left foramen is mildly narrowed. The spinal canal and right foramen are patent.  L4-5: Preserved disc height with mild disc bulging and moderate facet/ligamentous hypertrophy. Mild lateral recess narrowing bilaterally without nerve root encroachment.  L5-S1: Disc height and hydration are maintained. Moderate bilateral facet hypertrophy. No spinal stenosis or nerve root encroachment.  IMPRESSION: 1. Interval development of a disc extrusion in the left subarticular zone at L3-4 with inferior migration of a disc fragment behind the L4 vertebral body. There is associated mass effect on the thecal sac and left L4 nerve root encroachment. 2. No other significant spinal stenosis or nerve root encroachment. Stable moderate facet hypertrophy at L4-5 and L5-S1. 3. Mild dilatation of the common bile duct, not previously imaged; correlate with liver function studies.  Electronically Signed   By: Carey Bullocks M.D.   On: 12/25/2022 09:40 Note: Reviewed  Physical Exam  General appearance: Well nourished, well developed, and well hydrated. In no apparent acute distress Mental status: Alert, oriented x 3 (person, place, & time)       Respiratory: No evidence of acute respiratory distress Eyes: PERLA Vitals: BP 128/63   Pulse 82   Temp 97.8 F (36.6 C)   Resp 16   Ht 5\' 3"  (1.6 m)   Wt 165 lb (74.8 kg)   SpO2 99%   BMI 29.23 kg/m  BMI: Estimated body mass index is 29.23 kg/m as calculated from the following:   Height as of this encounter: 5\' 3"  (1.6 m).   Weight as of this encounter: 165 lb (74.8 kg). Ideal: Ideal body weight: 52.4 kg (115 lb 8.3 oz) Adjusted ideal body weight: 61.4 kg (135 lb 5 oz)  Assessment    Diagnosis Status  1. Chronic pain syndrome   2. Fibromyalgia   3. Pharmacologic therapy   4. Chronic neck pain (4th area of Pain) (Bilateral) (R>L)   5. Cervical facet syndrome (Bilateral) (R>L)   6. Lumbar facet joint pain   7. Encounter for medication management   8. Encounter for chronic pain management   9. Chronic upper extremity pain (5th area of Pain) (Bilateral) (R>L)   10. Chronic use of opiate for therapeutic purpose   11. Chronic lower extremity pain (2ry area of Pain) (Bilateral) (L>R)   12. Chronic low back pain (1ry area of Pain) (Bilateral) w/o sciatica   13. Chronic hip pain (3ry area of Pain) (Bilateral)    Controlled Controlled Controlled   Updated Problems: No problems updated.  Plan of Care  Problem-specific:  No problem-specific Assessment & Plan notes found for this encounter.  Ms. KAIYANA HOLTAN has a current medication list which includes the following long-term medication(s): albuterol, amitriptyline, dicyclomine, estradiol, fluticasone, hydrocodone-acetaminophen, metformin, metoprolol tartrate, montelukast, omeprazole, pregabalin, pregabalin, propranolol er, rosuvastatin, salmeterol, [START ON 02/21/2023] hydrocodone-acetaminophen, [START ON 03/23/2023] hydrocodone-acetaminophen, and [START ON 04/22/2023] hydrocodone-acetaminophen.  Pharmacotherapy (Medications Ordered): Meds ordered this encounter  Medications   HYDROcodone-acetaminophen (NORCO/VICODIN) 5-325 MG tablet    Sig: Take 1 tablet by mouth every 4 (four) hours as needed for severe pain. Must last 30 days    Dispense:  180 tablet    Refill:  0    DO NOT: delete (not duplicate); no partial-fill (will deny script to complete), no refill request (F/U required). DISPENSE: 1 day early if closed on fill date. WARN: No CNS-depressants within 8 hrs of med.   HYDROcodone-acetaminophen (NORCO/VICODIN) 5-325 MG tablet    Sig: Take 1 tablet by mouth every 4 (four) hours as needed for severe pain. Must  last 30 days    Dispense:  180 tablet    Refill:  0    DO NOT: delete (not duplicate); no partial-fill (will deny script to complete), no refill request (F/U required). DISPENSE: 1 day early if closed on fill date. WARN: No CNS-depressants within 8 hrs of med.   HYDROcodone-acetaminophen (NORCO/VICODIN) 5-325 MG tablet    Sig: Take 1 tablet by mouth every 4 (four) hours as needed for severe pain. Must last 30 days    Dispense:  180 tablet    Refill:  0    DO NOT: delete (not duplicate); no partial-fill (will deny script to complete), no refill request (F/U required). DISPENSE: 1 day early if closed on fill date. WARN: No CNS-depressants within 8 hrs of med.   naloxone (NARCAN) nasal spray 4 mg/0.1 mL    Sig:  Place 1 spray into the nose as needed for up to 365 doses (for opioid-induced respiratory depresssion). In case of emergency (overdose), spray once into each nostril. If no response within 3 minutes, repeat application and call 911.    Dispense:  1 each    Refill:  0    Instruct patient in proper use of device.   Orders:  Orders Placed This Encounter  Procedures   Tox[employer]SU[employer] Select 13 (MW), Urine    Volume: 30 ml(s). Minimum 3 ml of urine is needed. Document temperature of fresh sample. Indications: Long term (current) use of opiate analgesic (R60.454)    Order Specific Question:   Release to patient    Answer:   Immediate   Nursing Instructions:    1). STAT: UDS required today. 2). Make sure to document all opioids and benzodiazepines taken, including time of last intake. 3). If order is entered on a procedure day, make sure sample is obtained before any medications are administered.   Follow-up plan:   Return in about 3 months (around 05/22/2023) for Eval-day (M,W), (F2F), (MM).      Interventional Therapies  Risk  Complexity Considerations:      Planned  Pending:   See results of MRI done on 12/22/2022. Diagnostic/therapeutic left L3-4 LESI #1  Diagnostic/therapeutic left  L3-4 vs L4-5 TFESI #1    Under consideration:   Diagnostic/therapeutic bilateral lumbar facet MBB #3  Diagnostic bilateral cervical facet MBB  Diagnostic right CESI  Diagnostic bilateral IA shoulder joint injection  Diagnostic bilateral suprascapular NB  Possible bilateral lumbar facet RFA  Diagnostic left carpal tunnel injection    Completed:   Diagnostic bilateral lumbar facet MBB x2 (w/o steroids) (04/20/2017) (100/60/60/<25)    Therapeutic  Palliative (PRN) options:   none   Pharmacotherapy  Nonopioids transferred 08/24/2020: Magnesium, Flexeril, Lyrica, melatonin, vitamin B12, and vitamin D3.       Recent Visits Date Type Provider Dept  11/21/22 Office Visit Delano Metz, MD [facility]  Showing recent visits within past 90 days and meeting all other requirements Today's Visits Date Type Provider Dept  02/15/23 Office Visit Delano Metz, MD [facility]  Showing today's visits and meeting all other requirements Future Appointments No visits were found meeting these conditions. Showing future appointments within next 90 days and meeting all other requirements  I discussed the assessment and treatment plan with the patient. The patient was provided an opportunity to ask questions and all were answered. The patient agreed with the plan and demonstrated an understanding of the instructions.  Patient advised to call back or seek an in-person evaluation if the symptoms or condition worsens.  Duration of encounter: 30 minutes.  Total time on encounter, as per AMA guidelines included both the face-to-face and non-face-to-face time personally spent by the physician and/or other qualified health care professional(s) on the day of the encounter (includes time in activities that require the physician or other qualified health care professional and does not include time in activities normally performed by clinical staff). Physician's time may include the  following activities when performed: Preparing to see the patient (e.g., pre-charting review of records, searching for previously ordered imaging, lab work, and nerve conduction tests) Review of prior analgesic pharmacotherapies. Reviewing PMP Interpreting ordered tests (e.g., lab work, imaging, nerve conduction tests) Performing post-procedure evaluations, including interpretation of diagnostic procedures Obtaining and/or reviewing separately obtained history Performing a medically appropriate examination and/or evaluation Counseling and educating the patient/family/caregiver Ordering medications, tests, or procedures Referring  and communicating with other health care professionals (when not separately reported) Documenting clinical information in the electronic or other health record Independently interpreting results (not separately reported) and communicating results to the patient/ family/caregiver Care coordination (not separately reported)  Note by: Oswaldo Done, MD Date: 02/15/2023; Time: 3:11 PM

## 2023-02-20 LAB — TOXASSURE SELECT 13 (MW), URINE

## 2023-04-26 ENCOUNTER — Other Ambulatory Visit: Payer: Self-pay | Admitting: Pain Medicine

## 2023-04-26 DIAGNOSIS — Z79899 Other long term (current) drug therapy: Secondary | ICD-10-CM

## 2023-04-26 DIAGNOSIS — Z79891 Long term (current) use of opiate analgesic: Secondary | ICD-10-CM

## 2023-04-26 DIAGNOSIS — G894 Chronic pain syndrome: Secondary | ICD-10-CM

## 2023-04-26 DIAGNOSIS — G8929 Other chronic pain: Secondary | ICD-10-CM

## 2023-04-26 DIAGNOSIS — M47812 Spondylosis without myelopathy or radiculopathy, cervical region: Secondary | ICD-10-CM

## 2023-04-26 DIAGNOSIS — M5459 Other low back pain: Secondary | ICD-10-CM

## 2023-04-26 DIAGNOSIS — M797 Fibromyalgia: Secondary | ICD-10-CM

## 2023-04-26 DIAGNOSIS — M545 Low back pain, unspecified: Secondary | ICD-10-CM

## 2023-05-05 ENCOUNTER — Telehealth: Payer: Self-pay | Admitting: Pain Medicine

## 2023-05-05 NOTE — Telephone Encounter (Signed)
Wants to speak with a nurse, no reason given

## 2023-05-05 NOTE — Telephone Encounter (Signed)
Voicemail left in attempt to reach patient.

## 2023-05-11 NOTE — Progress Notes (Unsigned)
PROVIDER NOTE: Information contained herein reflects review and annotations entered in association with encounter. Interpretation of such information and data should be left to medically-trained personnel. Information provided to patient can be located elsewhere in the medical record under "Patient Instructions". Document created using STT-dictation technology, any transcriptional errors that may result from process are unintentional.    Patient: Zoe Sanchez  Service Category: E/M  Provider: Oswaldo Done, MD  DOB: 05/11/1956  DOS: 05/17/2023  Referring Provider: Hillery Aldo, MD  MRN: [id number]  Specialty: Interventional Pain Management  PCP: Hillery Aldo, MD  Type: Established Patient  Setting: Ambulatory outpatient    Location: Office  Delivery: Face-to-face     HPI  Zoe Sanchez, a 67 y.o. year old female, is here today because of her Chronic pain syndrome [G89.4]. Zoe Sanchez primary complain today is No chief complaint on file.  Pertinent problems: Zoe Sanchez has Myofascial pain syndrome; Cervical facet syndrome (Bilateral) (R>L); Chronic neck pain (4th area of Pain) (Bilateral) (R>L); Chronic pain syndrome; Cervical spondylosis; Fibromyalgia; Cervical herniated disc (C5-6 and C6-7); Cervical foraminal stenosis (Bilateral) (C5-6); Chronic cervical radicular pain (Bilateral) (R>L) (C8 Dermatome); Chronic low back pain (Bilateral) (L>R) w/ sciatica (Bilateral) (L>R); Spondylosis of lumbar spine; Family history of chronic pain; Carpal tunnel syndrome (Left); Chronic shoulder pain (Bilateral); Cervical facet arthropathy; Lumbar facet arthropathy; Bimalleolar fracture of left ankle; Lumbar facet syndrome (Bilateral) (R>L); Chronic upper extremity pain (5th area of Pain) (Bilateral) (R>L); Chronic lower extremity pain (2ry area of Pain) (Bilateral) (L>R); Chronic lumbar radicular pain (Bilateral) (S1); Arthropathy of lumbar facet joint; Cervical syndrome; Lumbar spondylosis; Synovial cyst  of lumbar spine; Prolapsed cervical intervertebral disc; Lumbar facet joint pain; Cervical radiculopathy; Stenosis of intervertebral foramina; Chronic hip pain (3ry area of Pain) (Bilateral); Shoulder pain; Chronic musculoskeletal pain; Neurogenic pain; Closed trimalleolar fracture of left ankle; Bilateral lower extremity edema; Chronic pain of multiple sites; Closed fracture of fifth metatarsal bone; Lumbosacral sensory radiculopathy at S1 (Bilateral); Decreased range of motion of lumbar spine; Numbness and tingling of lower extremity (Bilateral); Chronic low back pain (1ry area of Pain) (Bilateral) w/o sciatica; and Abnormal MRI, lumbar spine (06/05/2021) on their pertinent problem list. Pain Assessment: Severity of   is reported as a  /10. Location:    / . Onset:  . Quality:  . Timing:  . Modifying factor(s):  Marland Kitchen Vitals:  vitals were not taken for this visit.  BMI: Estimated body mass index is 29.23 kg/m as calculated from the following:   Height as of 02/15/23: 5\' 3"  (1.6 m).   Weight as of 02/15/23: 165 lb (74.8 kg). Last encounter: 02/15/2023. Last procedure: Visit date not found.  Reason for encounter: medication management. ***  RTCB: 08/20/2023   Pharmacotherapy Assessment  Analgesic: Hydrocodone/APAP 5/325, 1 tab PO q 4 hrs (30 mg/day of hydrocodone)(1,950 mg/day of acetaminophen) MME/day: 30 mg/day.   Monitoring: [place] PMP: PDMP reviewed during this encounter.       Pharmacotherapy: No side-effects or adverse reactions reported. Compliance: No problems identified. Effectiveness: Clinically acceptable.  No notes on file  No results found for: "CBDTHCR" No results found for: "D8THCCBX" No results found for: "D9THCCBX"  UDS:  Summary  Date Value Ref Range Status  02/15/2023 Note  Final    Comment:    ==================================================================== [facility] (MW) ==================================================================== Test  Result       Flag       Units  Drug Present and Declared for Prescription Verification   Hydrocodone                    5627         EXPECTED   ng/mg creat   Hydromorphone                  234          EXPECTED   ng/mg creat   Dihydrocodeine                 398          EXPECTED   ng/mg creat   Norhydrocodone                 1403         EXPECTED   ng/mg creat    Sources of hydrocodone include scheduled prescription medications.    Hydromorphone, dihydrocodeine and norhydrocodone are expected    metabolites of hydrocodone. Hydromorphone and dihydrocodeine are    also available as scheduled prescription medications.  ==================================================================== Test                      Result    Flag   Units      Ref Range   Creatinine              190              mg/dL      >=65 ==================================================================== Declared Medications:  The flagging and interpretation on this report are based on the  following declared medications.  Unexpected results may arise from  inaccuracies in the declared medications.   **Note: The testing scope of this panel includes these medications:   Hydrocodone (Norco)   **Note: The testing scope of this panel does not include the  following reported medications:   Acetaminophen (Norco)  Albuterol (Ventolin HFA)  Amitriptyline (Elavil)  Betamethasone  Cyclobenzaprine (Flexeril)  Dicyclomine (Bentyl)  Docusate (Colace)  Estradiol (Estrace)  Fluticasone (Flonase)  Gemfibrozil (Lopid)  Magnesium  Metformin (Glucophage)  Metoprolol (Lopressor)  Metronidazole (Flagyl)  Montelukast (Singulair)  Naloxone (Narcan)  Omeprazole (Prilosec)  Potassium (Klor-Con)  Pregabalin (Lyrica)  Propranolol (Inderal)  Rosuvastatin (Crestor)  Salmeterol  Sennosides (Senokot)  Umeclidinium (Incruse Ellipta)  Zolpidem  (Ambien) ==================================================================== For clinical consultation, please call [phone]. ====================================================================       ROS  Constitutional: Denies any fever or chills Gastrointestinal: No reported hemesis, hematochezia, vomiting, or acute GI distress Musculoskeletal: Denies any acute onset joint swelling, redness, loss of ROM, or weakness Neurological: No reported episodes of acute onset apraxia, aphasia, dysarthria, agnosia, amnesia, paralysis, loss of coordination, or loss of consciousness  Medication Review  HYDROcodone-acetaminophen, Magnesium Gluconate, albuterol, amitriptyline, betamethasone dipropionate, cyclobenzaprine, dicyclomine, docusate sodium, estradiol, fluticasone, gemfibrozil, metFORMIN, metoprolol tartrate, metroNIDAZOLE, montelukast, naloxone, omeprazole, potassium chloride, pregabalin, propranolol ER, rosuvastatin, salmeterol, senna, umeclidinium bromide, and zolpidem  History Review  Allergy: Zoe Sanchez is allergic to prednisone. Drug: Zoe Sanchez  reports no history of drug use. Alcohol:  reports no history of alcohol use. Tobacco:  reports that she has never smoked. She has never used smokeless tobacco. Social: Zoe Sanchez  reports that she has never smoked. She has never used smokeless tobacco. She reports that she does not drink alcohol and does not use drugs. Medical:  has a past medical history of Abnormal heart  rhythm, Abnormal mammogram (02/25/2014), Actinic keratosis, Anemia, Anxiety, Asthma, Awareness of heartbeats (06/29/2015), Basal cell carcinoma (04/24/2008), Basal cell carcinoma (04/09/2009), Basal cell carcinoma (11/02/2022), Basal cell carcinoma (BCC) (12/23/2014), Breath shortness (06/29/2015), CAD (coronary artery disease), Cancer (HCC), Cervical radicular pain (Location of Secondary source of pain) (Bilateral) (R>L) (C8 Dermatome) (06/29/2015), Chronic pain, COPD  (chronic obstructive pulmonary disease) (HCC), DDD (degenerative disc disease), cervical, Degenerative disk disease, Depression, Dysrhythmia, Endometriosis, Family history of chronic pain (06/29/2015), Fibromyalgia, Fibromyalgia, GERD (gastroesophageal reflux disease), Heart murmur, Hiatal hernia, History of attempted suicide (06/29/2015), History of hiatal hernia (06/29/2015), History of psychiatric disorder (06/29/2015), History of suicidal ideation (06/29/2015), Hyperlipidemia, Insomnia, Insomnia, Irritable bowel syndrome, 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 Surgical: Zoe Sanchez  has a past surgical history that includes Abdominal hysterectomy (1986); Tonsillectomy (1957); Skin lesion excision; Oophorectomy (1986); Breast surgery; ORIF ankle fracture (Left, 03/14/2018); ORIF ankle fracture (Left, 07/26/2018); Ankle surgery; Colonoscopy with propofol (N/A, 06/05/2019); Breast biopsy (Right, 2016); Fracture surgery; and Colonoscopy with propofol (N/A, 03/05/2020). Family: family history includes Breast cancer in her maternal aunt; Heart disease in her father; Hypertension in her father and mother.  Laboratory Chemistry Profile   Renal Lab Results  Component Value Date   BUN 21 04/26/2019   CREATININE 0.84 04/26/2019   GFRAA >60 04/26/2019   GFRNONAA >60 04/26/2019    Hepatic Lab Results  Component Value Date   AST 35 04/26/2019   ALT 22 04/26/2019   ALBUMIN 4.1 04/26/2019   ALKPHOS 71 04/26/2019    Electrolytes Lab Results  Component Value Date   NA 135 04/26/2019   K 4.5 04/26/2019   CL 103 04/26/2019   CALCIUM 9.4 04/26/2019   MG 1.8 04/26/2019    Bone Lab Results  Component Value Date   VD25OH 40.8 04/26/2019   25OHVITD1 49 03/09/2016   25OHVITD2 <1.0 03/09/2016   25OHVITD3 48 03/09/2016    Inflammation (CRP: Acute Phase) (ESR:  Chronic Phase) Lab Results  Component Value Date   CRP 2.2 (H) 04/26/2019   ESRSEDRATE 39 (H) 04/26/2019         Note: Above Lab results reviewed.  Recent Imaging Review  MR LUMBAR SPINE WO CONTRAST CLINICAL DATA:  Low back pain with bilateral radiculopathy.  EXAM: MRI LUMBAR SPINE WITHOUT CONTRAST  TECHNIQUE: Multiplanar, multisequence MR imaging of the lumbar spine was performed. No intravenous contrast was administered.  COMPARISON:  Lumbar spine radiographs 12/22/2022. Lumbar MRI 06/04/2021.  FINDINGS: Segmentation: Conventional anatomy assumed, with the last open disc space designated L5-S1.Concordant with prior imaging.  Alignment:  Physiologic.  Vertebrae: No worrisome osseous lesion, acute fracture or pars defect. The visualized sacroiliac joints appear unremarkable.  Conus medullaris: Extends to the L1 level and appears normal.  Paraspinal and other soft tissues: No significant paraspinal findings. The common bile duct measures up to 10 mm in diameter with smooth distal tapering, not previously imaged.  Disc levels:  Sagittal images demonstrate no significant disc space findings within the visualized lower thoracic spine.  L1-2: Minimal disc bulging and facet hypertrophy. No spinal stenosis or nerve root encroachment.  L2-3: Mild disc bulging and facet hypertrophy. No spinal stenosis or nerve root encroachment.  L3-4: Mildly progressive loss of disc height with annular disc bulging and interval development of a disc extrusion in the left subarticular zone. There is inferior migration of a moderate-sized disc fragment behind the L4 vertebral body with  associated mass effect on the thecal sac and left L4 nerve root encroachment. There is mild facet and ligamentous hypertrophy. The left foramen is mildly narrowed. The spinal canal and right foramen are patent.  L4-5: Preserved disc height with mild disc bulging and moderate facet/ligamentous  hypertrophy. Mild lateral recess narrowing bilaterally without nerve root encroachment.  L5-S1: Disc height and hydration are maintained. Moderate bilateral facet hypertrophy. No spinal stenosis or nerve root encroachment.  IMPRESSION: 1. Interval development of a disc extrusion in the left subarticular zone at L3-4 with inferior migration of a disc fragment behind the L4 vertebral body. There is associated mass effect on the thecal sac and left L4 nerve root encroachment. 2. No other significant spinal stenosis or nerve root encroachment. Stable moderate facet hypertrophy at L4-5 and L5-S1. 3. Mild dilatation of the common bile duct, not previously imaged; correlate with liver function studies.  Electronically Signed   By: Carey Bullocks M.D.   On: 12/25/2022 09:40 Note: Reviewed        Physical Exam  General appearance: Well nourished, well developed, and well hydrated. In no apparent acute distress Mental status: Alert, oriented x 3 (person, place, & time)       Respiratory: No evidence of acute respiratory distress Eyes: PERLA Vitals: There were no vitals taken for this visit. BMI: Estimated body mass index is 29.23 kg/m as calculated from the following:   Height as of 02/15/23: 5\' 3"  (1.6 m).   Weight as of 02/15/23: 165 lb (74.8 kg). Ideal: Patient weight not recorded  Assessment   Diagnosis Status  1. Chronic pain syndrome   2. Chronic low back pain (1ry area of Pain) (Bilateral) w/o sciatica   3. Chronic lower extremity pain (2ry area of Pain) (Bilateral) (L>R)   4. Chronic hip pain (3ry area of Pain) (Bilateral)   5. Chronic neck pain (4th area of Pain) (Bilateral) (R>L)   6. Chronic upper extremity pain (5th area of Pain) (Bilateral) (R>L)   7. Cervical facet syndrome (Bilateral) (R>L)   8. Lumbar facet joint pain   9. Pharmacologic therapy   10. Chronic use of opiate for therapeutic purpose   11. Encounter for medication management   12. Encounter for chronic  pain management    Controlled Controlled Controlled   Updated Problems: No problems updated.  Plan of Care  Problem-specific:  No problem-specific Assessment & Plan notes found for this encounter.  Zoe Sanchez has a current medication list which includes the following long-term medication(s): albuterol, amitriptyline, dicyclomine, estradiol, fluticasone, hydrocodone-acetaminophen, metformin, metoprolol tartrate, montelukast, omeprazole, pregabalin, pregabalin, propranolol er, rosuvastatin, and salmeterol.  Pharmacotherapy (Medications Ordered): No orders of the defined types were placed in this encounter.  Orders:  No orders of the defined types were placed in this encounter.  Follow-up plan:   No follow-ups on file.      Interventional Therapies  Risk  Complexity Considerations:      Planned  Pending:   See results of MRI done on 12/22/2022. Diagnostic/therapeutic left L3-4 LESI #1  Diagnostic/therapeutic left L3-4 vs L4-5 TFESI #1    Under consideration:   Diagnostic/therapeutic bilateral lumbar facet MBB #3  Diagnostic bilateral cervical facet MBB  Diagnostic right CESI  Diagnostic bilateral IA shoulder joint injection  Diagnostic bilateral suprascapular NB  Possible bilateral lumbar facet RFA  Diagnostic left carpal tunnel injection    Completed:   Diagnostic bilateral lumbar facet MBB x2 (w/o steroids) (04/20/2017) (100/60/60/<25)    Therapeutic  Palliative (PRN)  options:   none   Pharmacotherapy  Nonopioids transferred 08/24/2020: Magnesium, Flexeril, Lyrica, melatonin, vitamin B12, and vitamin D3.        Recent Visits Date Type Provider Dept  02/15/23 Office Visit Delano Metz, MD [facility]  Showing recent visits within past 90 days and meeting all other requirements Future Appointments Date Type Provider Dept  05/17/23 Appointment Delano Metz, MD [facility]  Showing future appointments within next 90  days and meeting all other requirements  I discussed the assessment and treatment plan with the patient. The patient was provided an opportunity to ask questions and all were answered. The patient agreed with the plan and demonstrated an understanding of the instructions.  Patient advised to call back or seek an in-person evaluation if the symptoms or condition worsens.  Duration of encounter: *** minutes.  Total time on encounter, as per AMA guidelines included both the face-to-face and non-face-to-face time personally spent by the physician and/or other qualified health care professional(s) on the day of the encounter (includes time in activities that require the physician or other qualified health care professional and does not include time in activities normally performed by clinical staff). Physician's time may include the following activities when performed: Preparing to see the patient (e.g., pre-charting review of records, searching for previously ordered imaging, lab work, and nerve conduction tests) Review of prior analgesic pharmacotherapies. Reviewing PMP Interpreting ordered tests (e.g., lab work, imaging, nerve conduction tests) Performing post-procedure evaluations, including interpretation of diagnostic procedures Obtaining and/or reviewing separately obtained history Performing a medically appropriate examination and/or evaluation Counseling and educating the patient/family/caregiver Ordering medications, tests, or procedures Referring and communicating with other health care professionals (when not separately reported) Documenting clinical information in the electronic or other health record Independently interpreting results (not separately reported) and communicating results to the patient/ family/caregiver Care coordination (not separately reported)  Note by: Oswaldo Done, MD Date: 05/17/2023; Time: 2:40 PM

## 2023-05-14 NOTE — Patient Instructions (Incomplete)
 ____________________________________________________________________________________________  Opioid Pain Medication Update  To: All patients taking opioid pain medications. (I.e.: hydrocodone, hydromorphone, oxycodone, oxymorphone, morphine, codeine, methadone, tapentadol, tramadol, buprenorphine, fentanyl, etc.)  Re: Updated review of side effects and adverse reactions of opioid analgesics, as well as new information about long term effects of this class of medications.  Direct risks of long-term opioid therapy are not limited to opioid addiction and overdose. Potential medical risks include serious fractures, breathing problems during sleep, hyperalgesia, immunosuppression, chronic constipation, bowel obstruction, myocardial infarction, and tooth decay secondary to xerostomia.  Unpredictable adverse effects that can occur even if you take your medication correctly: Cognitive impairment, respiratory depression, and death. Most people think that if they take their medication "correctly", and "as instructed", that they will be safe. Nothing could be farther from the truth. In reality, a significant amount of recorded deaths associated with the use of opioids has occurred in individuals that had taken the medication for a long time, and were taking their medication correctly. The following are examples of how this can happen: Patient taking his/her medication for a long time, as instructed, without any side effects, is given a certain antibiotic or another unrelated medication, which in turn triggers a "Drug-to-drug interaction" leading to disorientation, cognitive impairment, impaired reflexes, respiratory depression or an untoward event leading to serious bodily harm or injury, including death.  Patient taking his/her medication for a long time, as instructed, without any side effects, develops an acute impairment of liver and/or kidney function. This will lead to a rapid inability of the body to  breakdown and eliminate their pain medication, which will result in effects similar to an "overdose", but with the same medicine and dose that they had always taken. This again may lead to disorientation, cognitive impairment, impaired reflexes, respiratory depression or an untoward event leading to serious bodily harm or injury, including death.  A similar problem will occur with patients as they grow older and their liver and kidney function begins to decrease as part of the aging process.  Background information: Historically, the original case for using long-term opioid therapy to treat chronic noncancer pain was based on safety assumptions that subsequent experience has called into question. In 1996, the American Pain Society and the American Academy of Pain Medicine issued a consensus statement supporting long-term opioid therapy. This statement acknowledged the dangers of opioid prescribing but concluded that the risk for addiction was low; respiratory depression induced by opioids was short-lived, occurred mainly in opioid-naive patients, and was antagonized by pain; tolerance was not a common problem; and efforts to control diversion should not constrain opioid prescribing. This has now proven to be wrong. Experience regarding the risks for opioid addiction, misuse, and overdose in community practice has failed to support these assumptions.  According to the Centers for Disease Control and Prevention, fatal overdoses involving opioid analgesics have increased sharply over the past decade. Currently, more than 96,700 people die from drug overdoses every year. Opioids are a factor in 7 out of every 10 overdose deaths. Deaths from drug overdose have surpassed motor vehicle accidents as the leading cause of death for individuals between the ages of 80 and 61.  Clinical data suggest that neuroendocrine dysfunction may be very common in both men and women, potentially causing hypogonadism, erectile  dysfunction, infertility, decreased libido, osteoporosis, and depression. Recent studies linked higher opioid dose to increased opioid-related mortality. Controlled observational studies reported that long-term opioid therapy may be associated with increased risk for cardiovascular events. Subsequent meta-analysis concluded  that the safety of long-term opioid therapy in elderly patients has not been proven.   Side Effects and adverse reactions: Common side effects: Drowsiness (sedation). Dizziness. Nausea and vomiting. Constipation. Physical dependence -- Dependence often manifests with withdrawal symptoms when opioids are discontinued or decreased. Tolerance -- As you take repeated doses of opioids, you require increased medication to experience the same effect of pain relief. Respiratory depression -- This can occur in healthy people, especially with higher doses. However, people with COPD, asthma or other lung conditions may be even more susceptible to fatal respiratory impairment.  Uncommon side effects: An increased sensitivity to feeling pain and extreme response to pain (hyperalgesia). Chronic use of opioids can lead to this. Delayed gastric emptying (the process by which the contents of your stomach are moved into your small intestine). Muscle rigidity. Immune system and hormonal dysfunction. Quick, involuntary muscle jerks (myoclonus). Arrhythmia. Itchy skin (pruritus). Dry mouth (xerostomia).  Long-term side effects: Chronic constipation. Sleep-disordered breathing (SDB). Increased risk of bone fractures. Hypothalamic-pituitary-adrenal dysregulation. Increased risk of overdose.  RISKS: Respiratory depression and death: Opioids increase the risk of respiratory depression and death.  Drug-to-drug interactions: Opioids are relatively contraindicated in combination with benzodiazepines, sleep inducers, and other central nervous system depressants. Other classes of medications  (i.e.: certain antibiotics and even over-the-counter medications) may also trigger or induce respiratory depression in some patients.  Medical conditions: Patients with pre-existing respiratory problems are at higher risk of respiratory failure and/or depression when in combination with opioid analgesics. Opioids are relatively contraindicated in some medical conditions such as central sleep apnea.   Fractures and Falls:  Opioids increase the risk and incidence of falls. This is of particular importance in elderly patients.  Endocrine System:  Long-term administration is associated with endocrine abnormalities (endocrinopathies). (Also known as Opioid-induced Endocrinopathy) Influences on both the hypothalamic-pituitary-adrenal axis?and the hypothalamic-pituitary-gonadal axis have been demonstrated with consequent hypogonadism and adrenal insufficiency in both sexes. Hypogonadism and decreased levels of dehydroepiandrosterone sulfate have been reported in men and women. Endocrine effects include: Amenorrhoea in women (abnormal absence of menstruation) Reduced libido in both sexes Decreased sexual function Erectile dysfunction in men Hypogonadisms (decreased testicular function with shrinkage of testicles) Infertility Depression and fatigue Loss of muscle mass Anxiety Depression Immune suppression Hyperalgesia Weight gain Anemia Osteoporosis Patients (particularly women of childbearing age) should avoid opioids. There is insufficient evidence to recommend routine monitoring of asymptomatic patients taking opioids in the long-term for hormonal deficiencies.  Immune System: Human studies have demonstrated that opioids have an immunomodulating effect. These effects are mediated via opioid receptors both on immune effector cells and in the central nervous system. Opioids have been demonstrated to have adverse effects on antimicrobial response and anti-tumour surveillance. Buprenorphine has  been demonstrated to have no impact on immune function.  Opioid Induced Hyperalgesia: Human studies have demonstrated that prolonged use of opioids can lead to a state of abnormal pain sensitivity, sometimes called opioid induced hyperalgesia (OIH). Opioid induced hyperalgesia is not usually seen in the absence of tolerance to opioid analgesia. Clinically, hyperalgesia may be diagnosed if the patient on long-term opioid therapy presents with increased pain. This might be qualitatively and anatomically distinct from pain related to disease progression or to breakthrough pain resulting from development of opioid tolerance. Pain associated with hyperalgesia tends to be more diffuse than the pre-existing pain and less defined in quality. Management of opioid induced hyperalgesia requires opioid dose reduction.  Cancer: Chronic opioid therapy has been associated with an increased risk of cancer  among noncancer patients with chronic pain. This association was more evident in chronic strong opioid users. Chronic opioid consumption causes significant pathological changes in the small intestine and colon. Epidemiological studies have found that there is a link between opium dependence and initiation of gastrointestinal cancers. Cancer is the second leading cause of death after cardiovascular disease. Chronic use of opioids can cause multiple conditions such as GERD, immunosuppression and renal damage as well as carcinogenic effects, which are associated with the incidence of cancers.   Mortality: Long-term opioid use has been associated with increased mortality among patients with chronic non-cancer pain (CNCP).  Prescription of long-acting opioids for chronic noncancer pain was associated with a significantly increased risk of all-cause mortality, including deaths from causes other than overdose.  Reference: Von Korff M, Kolodny A, Deyo RA, Chou R. Long-term opioid therapy reconsidered. Ann Intern Med. 2011  Sep 6;155(5):325-8. doi: 10.7326/[id number]. PMID: 64403474; PMCID: [id number]. Randon Goldsmith, Hayward RA, Dunn KM, [place] KP. Risk of adverse events in patients prescribed long-term opioids: A cohort study in the [place] [facility] Research Datalink. Eur J Pain. 2019 May;23(5):908-922. doi: 10.1002/ejp.1357. Epub 2019 Jan 31. PMID: 43329518. Colameco S, Coren JS, Ciervo CA. Continuous opioid treatment for chronic noncancer pain: a time for moderation in prescribing. Postgrad Med. 2009 Jul;121(4):61-6. doi: 10.3810/pgm.2009.07.2032. PMID: 84166063. William Hamburger RN, [place] SD, Blazina I, Cristopher Peru, Bougatsos C, Deyo RA. The effectiveness and risks of long-term opioid therapy for chronic pain: a systematic review for a [employer] of [employer] Pathways to [employer]. Ann Intern Med. 2015 Feb 17;162(4):276-86. doi: 10.7326/M14-2559. PMID: 01601093. Caryl Bis [facility], Makuc DM. NCHS Data Brief No. 22. [place]: Centers for Disease Control and Prevention; 2009. Sep, Increase in Fatal Poisonings Involving Opioid Analgesics in the [place], 1999-2006. Song IA, Choi HR, Oh TK. Long-term opioid use and mortality in patients with chronic non-cancer pain: Ten-year follow-up study in [place] from 2010 through 2019. EClinicalMedicine. 2022 Jul 18;51:101558. doi: 10.1016/j.eclinm.2022.235573. PMID: 22025427; PMCID: [id number]. Huser, W., Schubert, T., Vogelmann, T. et al. All-cause mortality in patients with long-term opioid therapy compared with non-opioid analgesics for chronic non-cancer pain: a database study. BMC Med 18, 162 (2020). http://lester.info/ Rashidian H, Karie Kirks, Malekzadeh R, Haghdoost AA. An Ecological Study of the Association between Opiate Use and Incidence of Cancers. Addict Health. 2016 Fall;8(4):252-260. PMID: 51761607; PMCID: [id number].  Our Goal: Our goal is to control your  pain with means other than the use of opioid pain medications.  Our Recommendation: Talk to your physician about coming off of these medications. We can assist you with the tapering down and stopping these medicines. Based on the new information, even if you cannot completely stop the medication, a decrease in the dose may be associated with a lesser risk. Ask for other means of controlling the pain. Decrease or eliminate those factors that significantly contribute to your pain such as smoking, obesity, and a diet heavily tilted towards "inflammatory" nutrients.  Last Updated: 03/20/2023   ____________________________________________________________________________________________     ____________________________________________________________________________________________  National Pain Medication Shortage  The [place] is experiencing worsening drug shortages. These have had a negative widespread effect on patient care and treatment. Not expected to improve any time soon. Predicted to last past 2029.   Drug shortage list (generic names) Oxycodone IR Oxycodone/APAP Oxymorphone IR Hydromorphone Hydrocodone/APAP Morphine  Where is the problem?  Manufacturing and supply level.  Will this shortage affect you?  Only if you  take any of the above pain medications.  How? You may be unable to fill your prescription.  Your pharmacist may offer a "partial fill" of your prescription. (Warning: Do not accept partial fills.) Prescriptions partially filled cannot be transferred to another pharmacy. Read our Medication Rules and Regulation. Depending on how much medicine you are dependent on, you may experience withdrawals when unable to get the medication.  Recommendations: Consider ending your dependence on opioid pain medications. Ask your pain specialist to assist you with the process. Consider switching to a medication currently not in shortage, such as Buprenorphine. Talk to your pain  specialist about this option. Consider decreasing your pain medication requirements by managing tolerance thru "Drug Holidays". This may help minimize withdrawals, should you run out of medicine. Control your pain thru the use of non-pharmacological interventional therapies.   Your prescriber: Prescribers cannot be blamed for shortages. Medication manufacturing and supply issues cannot be fixed by the prescriber.   NOTE: The prescriber is not responsible for supplying the medication, or solving supply issues. Work with your pharmacist to solve it. The patient is responsible for the decision to take or continue taking the medication and for identifying and securing a legal supply source. By law, supplying the medication is the job and responsibility of the pharmacy. The prescriber is responsible for the evaluation, monitoring, and prescribing of these medications.   Prescribers will NOT: Re-issue prescriptions that have been partially filled. Re-issue prescriptions already sent to a pharmacy.  Re-send prescriptions to a different pharmacy because yours did not have your medication. Ask pharmacist to order more medicine or transfer the prescription to another pharmacy. (Read below.)  New 2023 regulation: "May 13, 2022 Revised Regulation Allows DEA-Registered Pharmacies to Transfer Electronic Prescriptions at a Patient's Request DEA Headquarters Division - Public Information Office Patients now have the ability to request their electronic prescription be transferred to another pharmacy without having to go back to their practitioner to initiate the request. This revised regulation went into effect on Monday, May 09, 2022.     At a patient's request, a DEA-registered retail pharmacy can now transfer an electronic prescription for a controlled substance (schedules II-V) to another DEA-registered retail pharmacy. Prior to this change, patients would have to go through their practitioner to  cancel their prescription and have it re-issued to a different pharmacy. The process was taxing and time consuming for both patients and practitioners.    The Drug Enforcement Administration [facility]) published its intent to revise the process for transferring electronic prescriptions on July 31, 2020.  The final rule was published in the federal register on April 07, 2022 and went into effect 30 days later.  Under the final rule, a prescription can only be transferred once between pharmacies, and only if allowed under existing state or other applicable law. The prescription must remain in its electronic form; may not be altered in any way; and the transfer must be communicated directly between two licensed pharmacists. It's important to note, any authorized refills transfer with the original prescription, which means the entire prescription will be filled at the same pharmacy".  Reference: [email or web address].is [facility] website announcement)  [email or web address] [employer] of Justice)   [employer] / Vol. 88, No. 143 / Thursday, April 07, 2022 / Rules and Regulations DEPARTMENT OF JUSTICE  Drug Enforcement Administration  21 CFR Part 1306  [Docket No. [id number]]  RIN [id number] Transfer of Electronic Prescriptions for Schedules II-V Controlled Substances Between Pharmacies for Initial Filling  ____________________________________________________________________________________________  ____________________________________________________________________________________________  Transfer of Pain Medication between Pharmacies  Re: 2023 DEA Clarification on existing regulation  Published on DEA Website: May 13, 2022  Title: Revised Regulation Allows DEA-Registered Pharmacies to Electrical engineer Prescriptions at a Patient's  Request DEA Headquarters Division - [employer]  "Patients now have the ability to request their electronic prescription be transferred to another pharmacy without having to go back to their practitioner to initiate the request. This revised regulation went into effect on Monday, May 09, 2022.     At a patient's request, a DEA-registered retail pharmacy can now transfer an electronic prescription for a controlled substance (schedules II-V) to another DEA-registered retail pharmacy. Prior to this change, patients would have to go through their practitioner to cancel their prescription and have it re-issued to a different pharmacy. The process was taxing and time consuming for both patients and practitioners.    The Drug Enforcement Administration [facility]) published its intent to revise the process for transferring electronic prescriptions on July 31, 2020.  The final rule was published in the federal register on April 07, 2022 and went into effect 30 days later.  Under the final rule, a prescription can only be transferred once between pharmacies, and only if allowed under existing state or other applicable law. The prescription must remain in its electronic form; may not be altered in any way; and the transfer must be communicated directly between two licensed pharmacists. It's important to note, any authorized refills transfer with the original prescription, which means the entire prescription will be filled at the same pharmacy."    REFERENCES: 1. DEA website announcement [email or web address]  2. Department of Justice website  [email or web address]  3. DEPARTMENT OF JUSTICE Drug Enforcement Administration 21 CFR Part 1306 [Docket No. [id number]] RIN [id number] "Transfer of Electronic Prescriptions for Schedules II-V Controlled Substances  Between Pharmacies for Initial Filling"  ____________________________________________________________________________________________     _______________________________________________________________________  Medication Rules  Purpose: To inform patients, and their family members, of our medication rules and regulations.  Applies to: All patients receiving prescriptions from our practice (written or electronic).  Pharmacy of record: This is the pharmacy where your electronic prescriptions will be sent. Make sure we have the correct one.  Electronic prescriptions: In compliance with the [facility] Strengthen Opioid Misuse Prevention (STOP) Act of 2017 (Session Conni Elliot [phone]), effective September 12, 2018, all controlled substances must be electronically prescribed. Written prescriptions, faxing, or calling prescriptions to a pharmacy will no longer be done.  Prescription refills: These will be provided only during in-person appointments. No medications will be renewed without a "face-to-face" evaluation with your provider. Applies to all prescriptions.  NOTE: The following applies primarily to controlled substances (Opioid* Pain Medications).   Type of encounter (visit): For patients receiving controlled substances, face-to-face visits are required. (Not an option and not up to the patient.)  Patient's responsibilities: Pain Pills: Bring all pain pills to every appointment (except for procedure appointments). Pill Bottles: Bring pills in original pharmacy bottle. Bring bottle, even if empty. Always bring the bottle of the most recent fill.  Medication refills: You are responsible for knowing and keeping track of what medications you are taking and when is it that you will need a refill. The day before your appointment: write a list of all prescriptions that need to be refilled. The day of the appointment: give the list to the admitting nurse. Prescriptions will be written only  during appointments. No prescriptions will be written on procedure days. If you forget a  medication: it will not be "Called in", "Faxed", or "electronically sent". You will need to get another appointment to get these prescribed. No early refills. Do not call asking to have your prescription filled early. Partial  or short prescriptions: Occasionally your pharmacy may not have enough pills to fill your prescription.  NEVER ACCEPT a partial fill or a prescription that is short of the total amount of pills that you were prescribed.  With controlled substances the law allows 72 hours for the pharmacy to complete the prescription.  If the prescription is not completed within 72 hours, the pharmacist will require a new prescription to be written. This means that you will be short on your medicine and we WILL NOT send another prescription to complete your original prescription.  Instead, request the pharmacy to send a carrier to a nearby branch to get enough medication to provide you with your full prescription. Prescription Accuracy: You are responsible for carefully inspecting your prescriptions before leaving our office. Have the discharge nurse carefully go over each prescription with you, before taking them home. Make sure that your name is accurately spelled, that your address is correct. Check the name and dose of your medication to make sure it is accurate. Check the number of pills, and the written instructions to make sure they are clear and accurate. Make sure that you are given enough medication to last until your next medication refill appointment. Taking Medication: Take medication as prescribed. When it comes to controlled substances, taking less pills or less frequently than prescribed is permitted and encouraged. Never take more pills than instructed. Never take the medication more frequently than prescribed.  Inform other Doctors: Always inform, all of your healthcare providers, of all the  medications you take. Pain Medication from other Providers: You are not allowed to accept any additional pain medication from any other Doctor or Healthcare provider. There are two exceptions to this rule. (see below) In the event that you require additional pain medication, you are responsible for notifying us, as stated below. Cough Medicine: Often these contain an opioid, such as codeine or hydrocodone. Never accept or take cough medicine containing these opioids if you are already taking an opioid* medication. The combination may cause respiratory failure and death. Medication Agreement: You are responsible for carefully reading and following our Medication Agreement. This must be signed before receiving any prescriptions from our practice. Safely store a copy of your signed Agreement. Violations to the Agreement will result in no further prescriptions. (Additional copies of our Medication Agreement are available upon request.) Laws, Rules, & Regulations: All patients are expected to follow all [address] and [employer], [employer], Rules, [place] [employer]. Ignorance of the Laws does not constitute a valid excuse.  Illegal drugs and Controlled Substances: The use of illegal substances (including, but not limited to marijuana and its derivatives) and/or the illegal use of any controlled substances is strictly prohibited. Violation of this rule may result in the immediate and permanent discontinuation of any and all prescriptions being written by our practice. The use of any illegal substances is prohibited. Adopted CDC guidelines & recommendations: Target dosing levels will be at or below 60 MME/day. Use of benzodiazepines** is not recommended.  Exceptions: There are only two exceptions to the rule of not receiving pain medications from other Healthcare Providers. Exception #1 (Emergencies): In the event of an emergency (i.e.: accident requiring emergency care), you are allowed to receive additional pain  medication. However, you are responsible for: As soon as  you are able, call our office [phone], at any time of the day or night, and leave a message stating your name, the date and nature of the emergency, and the name and dose of the medication prescribed. In the event that your call is answered by a member of our staff, make sure to document and save the date, time, and the name of the person that took your information.  Exception #2 (Planned Surgery): In the event that you are scheduled by another doctor or dentist to have any type of surgery or procedure, you are allowed (for a period no longer than 30 days), to receive additional pain medication, for the acute post-op pain. However, in this case, you are responsible for picking up a copy of our "Post-op Pain Management for Surgeons" handout, and giving it to your surgeon or dentist. This document is available at our office, and does not require an appointment to obtain it. Simply go to our office during business hours (Monday-Thursday from 8:00 AM to 4:00 PM) (Friday 8:00 AM to 12:00 Noon) or if you have a scheduled appointment with [place], prior to your surgery, and ask for it by name. In addition, you are responsible for: calling our office [phone], at any time of the day or night, and leaving a message stating your name, name of your surgeon, type of surgery, and date of procedure or surgery. Failure to comply with your responsibilities may result in termination of therapy involving the controlled substances. Medication Agreement Violation. Following the above rules, including your responsibilities will help you in avoiding a Medication Agreement Violation ("Breaking your Pain Medication Contract").  Consequences:  Not following the above rules may result in permanent discontinuation of medication prescription therapy.  *Opioid medications include: morphine, codeine, oxycodone, oxymorphone, hydrocodone, hydromorphone, meperidine, tramadol,  tapentadol, buprenorphine, fentanyl, methadone. **Benzodiazepine medications include: diazepam (Valium), alprazolam (Xanax), clonazepam (Klonopine), lorazepam (Ativan), clorazepate (Tranxene), chlordiazepoxide (Librium), estazolam (Prosom), oxazepam (Serax), temazepam (Restoril), triazolam (Halcion) (Last updated: 07/05/2022) ______________________________________________________________________    ______________________________________________________________________  Medication Recommendations and Reminders  Applies to: All patients receiving prescriptions (written and/or electronic).  Medication Rules & Regulations: You are responsible for reading, knowing, and following our "Medication Rules" document. These exist for your safety and that of others. They are not flexible and neither are we. Dismissing or ignoring them is an act of "non-compliance" that may result in complete and irreversible termination of such medication therapy. For safety reasons, "non-compliance" will not be tolerated. As with the [place] fundamental legal principle of "ignorance of the law is no defense", we will accept no excuses for not having read and knowing the content of documents provided to you by our practice.  Pharmacy of record:  Definition: This is the pharmacy where your electronic prescriptions will be sent.  We do not endorse any particular pharmacy. It is up to you and your insurance to decide what pharmacy to use.  We do not restrict you in your choice of pharmacy. However, once we write for your prescriptions, we will NOT be re-sending more prescriptions to fix restricted supply problems created by your pharmacy, or your insurance.  The pharmacy listed in the electronic medical record should be the one where you want electronic prescriptions to be sent. If you choose to change pharmacy, simply notify our nursing staff. Changes will be made only during your regular appointments and not over the  phone.  Recommendations: Keep all of your pain medications in a safe place, under lock and key, even  if you live alone. We will NOT replace lost, stolen, or damaged medication. We do not accept "Police Reports" as proof of medications having been stolen. After you fill your prescription, take 1 week's worth of pills and put them away in a safe place. You should keep a separate, properly labeled bottle for this purpose. The remainder should be kept in the original bottle. Use this as your primary supply, until it runs out. Once it's gone, then you know that you have 1 week's worth of medicine, and it is time to come in for a prescription refill. If you do this correctly, it is unlikely that you will ever run out of medicine. To make sure that the above recommendation works, it is very important that you make sure your medication refill appointments are scheduled at least 1 week before you run out of medicine. To do this in an effective manner, make sure that you do not leave the office without scheduling your next medication management appointment. Always ask the nursing staff to show you in your prescription , when your medication will be running out. Then arrange for the receptionist to get you a return appointment, at least 7 days before you run out of medicine. Do not wait until you have 1 or 2 pills left, to come in. This is very poor planning and does not take into consideration that we may need to cancel appointments due to bad weather, sickness, or emergencies affecting our staff. DO NOT ACCEPT A "Partial Fill": If for any reason your pharmacy does not have enough pills/tablets to completely fill or refill your prescription, do not allow for a "partial fill". The law allows the pharmacy to complete that prescription within 72 hours, without requiring a new prescription. If they do not fill the rest of your prescription within those 72 hours, you will need a separate prescription to fill the remaining  amount, which we will NOT provide. If the reason for the partial fill is your insurance, you will need to talk to the pharmacist about payment alternatives for the remaining tablets, but again, DO NOT ACCEPT A PARTIAL FILL, unless you can trust your pharmacist to obtain the remainder of the pills within 72 hours.  Prescription refills and/or changes in medication(s):  Prescription refills, and/or changes in dose or medication, will be conducted only during scheduled medication management appointments. (Applies to both, written and electronic prescriptions.) No refills on procedure days. No medication will be changed or started on procedure days. No changes, adjustments, and/or refills will be conducted on a procedure day. Doing so will interfere with the diagnostic portion of the procedure. No phone refills. No medications will be "called into the pharmacy". No Fax refills. No weekend refills. No Holliday refills. No after hours refills.  Remember:  Business hours are:  Monday to Thursday 8:00 AM to 4:00 PM Provider's Schedule: Delano Metz, MD - Appointments are:  Medication management: Monday and Wednesday 8:00 AM to 4:00 PM Procedure day: Tuesday and Thursday 7:30 AM to 4:00 PM Edward Jolly, MD - Appointments are:  Medication management: Tuesday and Thursday 8:00 AM to 4:00 PM Procedure day: Monday and Wednesday 7:30 AM to 4:00 PM (Last update: 07/05/2022) ______________________________________________________________________   ____________________________________________________________________________________________  Naloxone Nasal Spray  Why am I receiving this medication? [place] STOP ACT requires that all patients taking high dose opioids or at risk of opioids respiratory depression, be prescribed an opioid reversal agent, such as Naloxone (AKA: Narcan).  What is this medication? NALOXONE (  nal OX one) treats opioid overdose, which causes slow or shallow breathing,  severe drowsiness, or trouble staying awake. Call emergency services after using this medication. You may need additional treatment. Naloxone works by reversing the effects of opioids. It belongs to a group of medications called opioid blockers.  COMMON BRAND NAME(S): Kloxxado, Narcan  What should I tell my care team before I take this medication? They need to know if you have any of these conditions: Heart disease Substance use disorder An unusual or allergic reaction to naloxone, other medications, foods, dyes, or preservatives Pregnant or trying to get pregnant Breast-feeding  When to use this medication? This medication is to be used for the treatment of respiratory depression (less than 8 breaths per minute) secondary to opioid overdose.   How to use this medication? This medication is for use in the nose. Lay the person on their back. Support their neck with your hand and allow the head to tilt back before giving the medication. The nasal spray should be given into 1 nostril. After giving the medication, move the person onto their side. Do not remove or test the nasal spray until ready to use. Get emergency medical help right away after giving the first dose of this medication, even if the person wakes up. You should be familiar with how to recognize the signs and symptoms of a narcotic overdose. If more doses are needed, give the additional dose in the other nostril. Talk to your care team about the use of this medication in children. While this medication may be prescribed for children as young as newborns for selected conditions, precautions do apply.  Naloxone Overdosage: If you think you have taken too much of this medicine contact a poison control center or emergency room at once.  NOTE: This medicine is only for you. Do not share this medicine with others.  What if I miss a dose? This does not apply.  What may interact with this medication? This is only used during an  emergency. No interactions are expected during emergency use. This list may not describe all possible interactions. Give your health care provider a list of all the medicines, herbs, non-prescription drugs, or dietary supplements you use. Also tell them if you smoke, drink alcohol, or use illegal drugs. Some items may interact with your medicine.  What should I watch for while using this medication? Keep this medication ready for use in the case of an opioid overdose. Make sure that you have the phone number of your care team and local hospital ready. You may need to have additional doses of this medication. Each nasal spray contains a single dose. Some emergencies may require additional doses. After use, bring the treated person to the nearest hospital or call 911. Make sure the treating care team knows that the person has received a dose of this medication. You will receive additional instructions on what to do during and after use of this medication before an emergency occurs.  What side effects may I notice from receiving this medication? Side effects that you should report to your care team as soon as possible: Allergic reactions--skin rash, itching, hives, swelling of the face, lips, tongue, or throat Side effects that usually do not require medical attention (report these to your care team if they continue or are bothersome): Constipation Dryness or irritation inside the nose Headache Increase in blood pressure Muscle spasms Stuffy nose Toothache This list may not describe all possible side effects. Call your doctor for  medical advice about side effects. You may report side effects to FDA at 1-800-FDA-1088.  Where should I keep my medication? Because this is an emergency medication, you should keep it with you at all times.  Keep out of the reach of children and pets. Store between 20 and 25 degrees C (68 and 77 degrees F). Do not freeze. Throw away any unused medication after the  expiration date. Keep in original box until ready to use.  NOTE: This sheet is a summary. It may not cover all possible information. If you have questions about this medicine, talk to your doctor, pharmacist, or health care provider.   2023 Elsevier/Gold Standard (2021-05-07 00:00:00)  ____________________________________________________________________________________________

## 2023-05-17 ENCOUNTER — Encounter: Payer: Self-pay | Admitting: Pain Medicine

## 2023-05-17 ENCOUNTER — Ambulatory Visit: Payer: 59 | Attending: Pain Medicine | Admitting: Pain Medicine

## 2023-05-17 VITALS — BP 104/64 | HR 74 | Temp 98.2°F | Ht 63.0 in | Wt 170.0 lb

## 2023-05-17 DIAGNOSIS — M5126 Other intervertebral disc displacement, lumbar region: Secondary | ICD-10-CM

## 2023-05-17 DIAGNOSIS — M545 Low back pain, unspecified: Secondary | ICD-10-CM | POA: Insufficient documentation

## 2023-05-17 DIAGNOSIS — M5386 Other specified dorsopathies, lumbar region: Secondary | ICD-10-CM | POA: Diagnosis present

## 2023-05-17 DIAGNOSIS — M47812 Spondylosis without myelopathy or radiculopathy, cervical region: Secondary | ICD-10-CM

## 2023-05-17 DIAGNOSIS — Z79891 Long term (current) use of opiate analgesic: Secondary | ICD-10-CM | POA: Diagnosis present

## 2023-05-17 DIAGNOSIS — M47817 Spondylosis without myelopathy or radiculopathy, lumbosacral region: Secondary | ICD-10-CM | POA: Insufficient documentation

## 2023-05-17 DIAGNOSIS — M79605 Pain in left leg: Secondary | ICD-10-CM | POA: Insufficient documentation

## 2023-05-17 DIAGNOSIS — M25551 Pain in right hip: Secondary | ICD-10-CM | POA: Diagnosis present

## 2023-05-17 DIAGNOSIS — Z79899 Other long term (current) drug therapy: Secondary | ICD-10-CM

## 2023-05-17 DIAGNOSIS — G8929 Other chronic pain: Secondary | ICD-10-CM

## 2023-05-17 DIAGNOSIS — G894 Chronic pain syndrome: Secondary | ICD-10-CM

## 2023-05-17 DIAGNOSIS — R937 Abnormal findings on diagnostic imaging of other parts of musculoskeletal system: Secondary | ICD-10-CM | POA: Diagnosis present

## 2023-05-17 DIAGNOSIS — M542 Cervicalgia: Secondary | ICD-10-CM | POA: Diagnosis present

## 2023-05-17 DIAGNOSIS — M47816 Spondylosis without myelopathy or radiculopathy, lumbar region: Secondary | ICD-10-CM

## 2023-05-17 DIAGNOSIS — M79601 Pain in right arm: Secondary | ICD-10-CM | POA: Insufficient documentation

## 2023-05-17 DIAGNOSIS — M79602 Pain in left arm: Secondary | ICD-10-CM | POA: Insufficient documentation

## 2023-05-17 DIAGNOSIS — M5459 Other low back pain: Secondary | ICD-10-CM

## 2023-05-17 DIAGNOSIS — M79604 Pain in right leg: Secondary | ICD-10-CM | POA: Diagnosis present

## 2023-05-17 DIAGNOSIS — M25552 Pain in left hip: Secondary | ICD-10-CM | POA: Insufficient documentation

## 2023-05-17 MED ORDER — HYDROCODONE-ACETAMINOPHEN 5-325 MG PO TABS: 1.0000 | ORAL_TABLET | ORAL | 0 refills | Status: DC | PRN

## 2023-05-17 NOTE — Progress Notes (Signed)
Nursing Pain Medication Assessment:  Safety precautions to be maintained throughout the outpatient stay will include: orient to surroundings, keep bed in low position, maintain call bell within reach at all times, provide assistance with transfer out of bed and ambulation.  Medication Inspection Compliance: Pill count conducted under aseptic conditions, in front of the patient. Neither the pills nor the bottle was removed from the patient's sight at any time. Once count was completed pills were immediately returned to the patient in their original bottle.  Medication: Hydrocodone/APAP Pill/Patch Count:  48 of 180 pills remain Pill/Patch Appearance: Markings consistent with prescribed medication Bottle Appearance: Standard pharmacy container. Clearly labeled. Filled Date: 08 / 10 / 2024 Last Medication intake:  Today

## 2023-05-29 ENCOUNTER — Other Ambulatory Visit: Payer: Self-pay | Admitting: Dermatology

## 2023-06-01 ENCOUNTER — Ambulatory Visit (INDEPENDENT_AMBULATORY_CARE_PROVIDER_SITE_OTHER): Payer: 59 | Admitting: Dermatology

## 2023-06-01 DIAGNOSIS — L578 Other skin changes due to chronic exposure to nonionizing radiation: Secondary | ICD-10-CM | POA: Diagnosis not present

## 2023-06-01 DIAGNOSIS — Z1283 Encounter for screening for malignant neoplasm of skin: Secondary | ICD-10-CM | POA: Diagnosis not present

## 2023-06-01 DIAGNOSIS — Z7189 Other specified counseling: Secondary | ICD-10-CM

## 2023-06-01 DIAGNOSIS — L439 Lichen planus, unspecified: Secondary | ICD-10-CM | POA: Diagnosis not present

## 2023-06-01 DIAGNOSIS — Z79899 Other long term (current) drug therapy: Secondary | ICD-10-CM

## 2023-06-01 DIAGNOSIS — L820 Inflamed seborrheic keratosis: Secondary | ICD-10-CM | POA: Diagnosis not present

## 2023-06-01 DIAGNOSIS — W908XXA Exposure to other nonionizing radiation, initial encounter: Secondary | ICD-10-CM

## 2023-06-01 DIAGNOSIS — I781 Nevus, non-neoplastic: Secondary | ICD-10-CM

## 2023-06-01 DIAGNOSIS — Z8589 Personal history of malignant neoplasm of other organs and systems: Secondary | ICD-10-CM

## 2023-06-01 DIAGNOSIS — D1801 Hemangioma of skin and subcutaneous tissue: Secondary | ICD-10-CM

## 2023-06-01 DIAGNOSIS — L821 Other seborrheic keratosis: Secondary | ICD-10-CM

## 2023-06-01 DIAGNOSIS — L814 Other melanin hyperpigmentation: Secondary | ICD-10-CM

## 2023-06-01 DIAGNOSIS — D229 Melanocytic nevi, unspecified: Secondary | ICD-10-CM

## 2023-06-01 DIAGNOSIS — Z85828 Personal history of other malignant neoplasm of skin: Secondary | ICD-10-CM

## 2023-06-01 MED ORDER — METRONIDAZOLE 500 MG PO TABS: ORAL_TABLET | ORAL | 1 refills | Status: DC

## 2023-06-01 NOTE — Patient Instructions (Signed)

## 2023-06-01 NOTE — Progress Notes (Signed)
Follow-Up Visit   Subjective  Zoe Sanchez is a 67 y.o. female who presents for the following: Skin Cancer Screening and Full Body Skin Exam  The patient presents for Total-Body Skin Exam (TBSE) for skin cancer screening and mole check. The patient has spots, moles and lesions to be evaluated, some may be new or changing and the patient may have concern these could be cancer.    The following portions of the chart were reviewed this encounter and updated as appropriate: medications, allergies, medical history  Review of Systems:  No other skin or systemic complaints except as noted in HPI or Assessment and Plan.  Objective  Well appearing patient in no apparent distress; mood and affect are within normal limits.  A full examination was performed including scalp, head, eyes, ears, nose, lips, neck, chest, axillae, abdomen, back, buttocks, bilateral upper extremities, bilateral lower extremities, hands, feet, fingers, toes, fingernails, and toenails. All findings within normal limits unless otherwise noted below.   Relevant physical exam findings are noted in the Assessment and Plan.  Trunk, extremities Vague pink polygonal patches of the chest.  L breast x 1, L upper arm x 2, L elbow x 1, L cheek x 1, L leg x 9, R leg x 3 (17) Erythematous stuck-on, waxy papule or plaque  Right Breast Blanching red macule.     Assessment & Plan   SKIN CANCER SCREENING PERFORMED TODAY.  ACTINIC DAMAGE - Chronic condition, secondary to cumulative UV/sun exposure - diffuse scaly erythematous macules with underlying dyspigmentation - Recommend daily broad spectrum sunscreen SPF 30+ to sun-exposed areas, reapply every 2 hours as needed.  - Staying in the shade or wearing long sleeves, sun glasses (UVA+UVB protection) and wide brim hats (4-inch brim around the entire circumference of the hat) are also recommended for sun protection.  - Call for new or changing lesions.  LENTIGINES,  SEBORRHEIC KERATOSES, HEMANGIOMAS - Benign normal skin lesions - Benign-appearing - Call for any changes  MELANOCYTIC NEVI - Tan-brown and/or pink-flesh-colored symmetric macules and papules - Benign appearing on exam today - Observation - Call clinic for new or changing moles - Recommend daily use of broad spectrum spf 30+ sunscreen to sun-exposed areas.   HISTORY OF BASAL CELL CARCINOMA OF THE SKIN - No evidence of recurrence today - Recommend regular full body skin exams - Recommend daily broad spectrum sunscreen SPF 30+ to sun-exposed areas, reapply every 2 hours as needed.  - Call if any new or changing lesions are noted between office visits  HISTORY OF SQUAMOUS CELL CARCINOMA OF THE SKIN - No evidence of recurrence today - No lymphadenopathy - Recommend regular full body skin exams - Recommend daily broad spectrum sunscreen SPF 30+ to sun-exposed areas, reapply every 2 hours as needed.  - Call if any new or changing lesions are noted between office visits  Lichen planus Trunk, extremities Lichen planus - Bx proven, Chronic and persistent condition with duration or expected duration over one year. Condition is symptomatic/ bothersome to patient. Not currently at goal, but improved on treatment.  Patient prefers to continue Metronidazole 500 mg po BID as she feels this is the most helpful medications for her condition.   Continue Metronidazole 500 mg po BID. #180 1 RF.  Long term medication management.  Patient is using long term (months to years) prescription medication  to control their dermatologic condition.  These medications require periodic monitoring to evaluate for efficacy and side effects and may require periodic laboratory monitoring.  Inflamed seborrheic keratosis (17) L breast x 1, L upper arm x 2, L elbow x 1, L cheek x 1, L leg x 9, R leg x 3  Symptomatic, irritating, patient would like treated.   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 lichen planus, ISKs.  Maylene Roes, CMA, am acting as scribe for Armida Sans, MD .   Documentation: I have reviewed the above documentation for accuracy and completeness, and I agree with the above.  Armida Sans, MD

## 2023-06-06 ENCOUNTER — Encounter: Payer: Self-pay | Admitting: Dermatology

## 2023-06-28 ENCOUNTER — Other Ambulatory Visit: Payer: Self-pay | Admitting: Family Medicine

## 2023-06-28 DIAGNOSIS — Z1231 Encounter for screening mammogram for malignant neoplasm of breast: Secondary | ICD-10-CM

## 2023-08-03 ENCOUNTER — Ambulatory Visit
Admission: RE | Admit: 2023-08-03 | Discharge: 2023-08-03 | Disposition: A | Discharge status: Home / Self Care | Payer: 59 | Source: Ambulatory Visit | Attending: Family Medicine | Admitting: Family Medicine

## 2023-08-03 DIAGNOSIS — Z1231 Encounter for screening mammogram for malignant neoplasm of breast: Secondary | ICD-10-CM | POA: Insufficient documentation

## 2023-08-07 ENCOUNTER — Other Ambulatory Visit: Payer: Self-pay | Admitting: Pain Medicine

## 2023-08-07 DIAGNOSIS — M47812 Spondylosis without myelopathy or radiculopathy, cervical region: Secondary | ICD-10-CM

## 2023-08-07 DIAGNOSIS — G894 Chronic pain syndrome: Secondary | ICD-10-CM

## 2023-08-07 DIAGNOSIS — M5459 Other low back pain: Secondary | ICD-10-CM

## 2023-08-07 DIAGNOSIS — Z79899 Other long term (current) drug therapy: Secondary | ICD-10-CM

## 2023-08-07 DIAGNOSIS — M545 Low back pain, unspecified: Secondary | ICD-10-CM

## 2023-08-07 DIAGNOSIS — G8929 Other chronic pain: Secondary | ICD-10-CM

## 2023-08-07 DIAGNOSIS — Z79891 Long term (current) use of opiate analgesic: Secondary | ICD-10-CM

## 2023-08-09 ENCOUNTER — Other Ambulatory Visit: Payer: Self-pay | Admitting: Family Medicine

## 2023-08-09 DIAGNOSIS — R928 Other abnormal and inconclusive findings on diagnostic imaging of breast: Secondary | ICD-10-CM

## 2023-08-16 ENCOUNTER — Encounter: Payer: Self-pay | Admitting: Pain Medicine

## 2023-08-16 ENCOUNTER — Ambulatory Visit: Payer: 59 | Attending: Pain Medicine | Admitting: Pain Medicine

## 2023-08-16 VITALS — BP 150/66 | HR 79 | Temp 97.3°F | Resp 16 | Ht 63.0 in | Wt 170.0 lb

## 2023-08-16 DIAGNOSIS — M79601 Pain in right arm: Secondary | ICD-10-CM | POA: Diagnosis present

## 2023-08-16 DIAGNOSIS — G894 Chronic pain syndrome: Secondary | ICD-10-CM | POA: Diagnosis present

## 2023-08-16 DIAGNOSIS — M25552 Pain in left hip: Secondary | ICD-10-CM | POA: Diagnosis present

## 2023-08-16 DIAGNOSIS — M25551 Pain in right hip: Secondary | ICD-10-CM | POA: Diagnosis present

## 2023-08-16 DIAGNOSIS — Z79899 Other long term (current) drug therapy: Secondary | ICD-10-CM | POA: Insufficient documentation

## 2023-08-16 DIAGNOSIS — G8929 Other chronic pain: Secondary | ICD-10-CM | POA: Insufficient documentation

## 2023-08-16 DIAGNOSIS — M79602 Pain in left arm: Secondary | ICD-10-CM | POA: Diagnosis present

## 2023-08-16 DIAGNOSIS — M47812 Spondylosis without myelopathy or radiculopathy, cervical region: Secondary | ICD-10-CM | POA: Diagnosis present

## 2023-08-16 DIAGNOSIS — M5459 Other low back pain: Secondary | ICD-10-CM | POA: Insufficient documentation

## 2023-08-16 DIAGNOSIS — M542 Cervicalgia: Secondary | ICD-10-CM | POA: Diagnosis present

## 2023-08-16 DIAGNOSIS — M79604 Pain in right leg: Secondary | ICD-10-CM | POA: Insufficient documentation

## 2023-08-16 DIAGNOSIS — Z79891 Long term (current) use of opiate analgesic: Secondary | ICD-10-CM | POA: Insufficient documentation

## 2023-08-16 DIAGNOSIS — M79605 Pain in left leg: Secondary | ICD-10-CM | POA: Insufficient documentation

## 2023-08-16 DIAGNOSIS — M545 Low back pain, unspecified: Secondary | ICD-10-CM | POA: Diagnosis present

## 2023-08-16 MED ORDER — HYDROCODONE-ACETAMINOPHEN 5-325 MG PO TABS: 1.0000 | ORAL_TABLET | ORAL | 0 refills | Status: DC | PRN

## 2023-08-16 NOTE — Progress Notes (Signed)
PROVIDER NOTE: Information contained herein reflects review and annotations entered in association with encounter. Interpretation of such information and data should be left to medically-trained personnel. Information provided to patient can be located elsewhere in the medical record under "Patient Instructions". Document created using STT-dictation technology, any transcriptional errors that may result from process are unintentional.    Patient: Zoe Sanchez  Service Category: E/M  Provider: Oswaldo Done, MD  DOB: [birth year]  DOS: 08/16/2023  Referring Provider: Hillery Aldo, MD  MRN: [id number]  Specialty: Interventional Pain Management  PCP: Hillery Aldo, MD  Type: Established Patient  Setting: Ambulatory outpatient    Location: Office  Delivery: Face-to-face     HPI  [name], a 67 y.o. year old female, is here today because of her Chronic bilateral low back pain without sciatica [M54.50, G89.29]. [name] primary complain today is Back Pain and Neck Pain  Pertinent problems: Zoe Sanchez has Myofascial pain syndrome; Cervical facet syndrome (Bilateral) (R>L); Chronic neck pain (4th area of Pain) (Bilateral) (R>L); Chronic pain syndrome; Cervical spondylosis; Fibromyalgia; Cervical herniated disc (C5-6 and C6-7); Cervical foraminal stenosis (Bilateral) (C5-6); Chronic cervical radicular pain (Bilateral) (R>L) (C8 Dermatome); Chronic low back pain (Bilateral) (L>R) w/ sciatica (Bilateral) (L>R); Spondylosis of lumbar spine; Family history of chronic pain; Carpal tunnel syndrome (Left); Chronic shoulder pain (Bilateral); Cervical facet arthropathy; Lumbar facet arthropathy; Bimalleolar fracture of left ankle; Lumbar facet syndrome (Bilateral) (R>L); Chronic upper extremity pain (5th area of Pain) (Bilateral) (R>L); Chronic lower extremity pain (2ry area of Pain) (Bilateral) (L>R); Chronic lumbar radicular pain (Bilateral) (S1); Arthropathy of lumbar facet joint; Cervical syndrome;  Lumbar spondylosis; Synovial cyst of lumbar spine; Prolapsed cervical intervertebral disc; Lumbar facet joint pain; Cervical radiculopathy; Stenosis of intervertebral foramina; Chronic hip pain (3ry area of Pain) (Bilateral); Shoulder pain; Chronic musculoskeletal pain; Neurogenic pain; Closed trimalleolar fracture of left ankle; Bilateral lower extremity edema; Chronic pain of multiple sites; Closed fracture of fifth metatarsal bone; Lumbosacral sensory radiculopathy at S1 (Bilateral); Decreased range of motion of lumbar spine; Numbness and tingling of lower extremity (Bilateral); Chronic low back pain (1ry area of Pain) (Bilateral) w/o sciatica; Abnormal MRI, lumbar spine (12/25/2022); Spondylosis without myelopathy or radiculopathy, lumbosacral region; Lumbar facet hypertrophy (Multilevel) (Bilateral); and Lumbar disc extrusion (Left: L3-4) on their pertinent problem list. Pain Assessment: Severity of Chronic pain is reported as a 2 /10. Location: Back Lower/denies. Onset: More than a month ago. Quality: Aching, Sharp. Timing: Constant. Modifying factor(s): rest. Vitals:  height is 5\' 3"  (1.6 m) and weight is 170 lb (77.1 kg). Her temperature is 97.3 F (36.3 C) (abnormal). Her blood pressure is 150/66 (abnormal) and her pulse is 79. Her respiration is 16 and oxygen saturation is 99%.  BMI: Estimated body mass index is 30.11 kg/m as calculated from the following:   Height as of this encounter: 5\' 3"  (1.6 m).   Weight as of this encounter: 170 lb (77.1 kg). Last encounter: 05/17/2023. Last procedure: Visit date not found.  Reason for encounter: medication management.  The patient indicates doing well with the current medication regimen. No adverse reactions or side effects reported to the medications.  Discussed the use of AI scribe software for clinical note transcription with the patient, who gave verbal consent to proceed.  History of Present Illness   The patient, who is on a regular medication  regimen, reports no adverse reactions or side effects to their current treatment. They have been compliant with their medication, as confirmed by  their last urine drug screen test and the Prescription Monitoring Program (PMP). The patient has no new complaints or concerns at this time. They are scheduled for a regular checkup in three months.     RTCB: 11/18/2023   Pharmacotherapy Assessment  Analgesic: Hydrocodone/APAP 5/325, 1 tab PO q 4 hrs (30 mg/day of hydrocodone)(1,950 mg/day of acetaminophen) MME/day: 30 mg/day.   Monitoring: [place] PMP: PDMP reviewed during this encounter.       Pharmacotherapy: No side-effects or adverse reactions reported. Compliance: No problems identified. Effectiveness: Clinically acceptable.  Valerie Salts, RN  08/16/2023  2:29 PM  Sign when Signing Visit Nursing Pain Medication Assessment:  Safety precautions to be maintained throughout the outpatient stay will include: orient to surroundings, keep bed in low position, maintain call bell within reach at all times, provide assistance with transfer out of bed and ambulation.  Medication Inspection Compliance: Pill count conducted under aseptic conditions, in front of the patient. Neither the pills nor the bottle was removed from the patient's sight at any time. Once count was completed pills were immediately returned to the patient in their original bottle.  Medication: Hydrocodone/APAP Pill/Patch Count:  174 of 180 pills remain Pill/Patch Appearance: Markings consistent with prescribed medication Bottle Appearance: Standard pharmacy container. Clearly labeled. Filled Date: 67 / 25 / 2024 Last Medication intake:  Today    No results found for: "CBDTHCR" No results found for: "D8THCCBX" No results found for: "D9THCCBX"  UDS:  Summary  Date Value Ref Range Status  02/15/2023 Note  Final    Comment:    ==================================================================== [employer] [facility] [employer]  (MW) ==================================================================== Test                             Result       Flag       Units  Drug Present and Declared for Prescription Verification   Hydrocodone                    5627         EXPECTED   ng/mg creat   Hydromorphone                  234          EXPECTED   ng/mg creat   Dihydrocodeine                 398          EXPECTED   ng/mg creat   Norhydrocodone                 1403         EXPECTED   ng/mg creat    Sources of hydrocodone include scheduled prescription medications.    Hydromorphone, dihydrocodeine and norhydrocodone are expected    metabolites of hydrocodone. Hydromorphone and dihydrocodeine are    also available as scheduled prescription medications.  ==================================================================== Test                      Result    Flag   Units      Ref Range   Creatinine              190              mg/dL      >=16 ==================================================================== Declared Medications:  The flagging and interpretation on this report are based on the  following declared  medications.  Unexpected results may arise from  inaccuracies in the declared medications.   **Note: The testing scope of this panel includes these medications:   Hydrocodone (Norco)   **Note: The testing scope of this panel does not include the  following reported medications:   Acetaminophen (Norco)  Albuterol (Ventolin HFA)  Amitriptyline (Elavil)  Betamethasone  Cyclobenzaprine (Flexeril)  Dicyclomine (Bentyl)  Docusate (Colace)  Estradiol (Estrace)  Fluticasone (Flonase)  Gemfibrozil (Lopid)  Magnesium  Metformin (Glucophage)  Metoprolol (Lopressor)  Metronidazole (Flagyl)  Montelukast (Singulair)  Naloxone (Narcan)  Omeprazole (Prilosec)  Potassium (Klor-Con)  Pregabalin (Lyrica)  Propranolol (Inderal)  Rosuvastatin (Crestor)  Salmeterol  Sennosides (Senokot)  Umeclidinium  (Incruse Ellipta)  Zolpidem (Ambien) ==================================================================== For clinical consultation, please call [phone]. ====================================================================       ROS  Constitutional: Denies any fever or chills Gastrointestinal: No reported hemesis, hematochezia, vomiting, or acute GI distress Musculoskeletal: Denies any acute onset joint swelling, redness, loss of ROM, or weakness Neurological: No reported episodes of acute onset apraxia, aphasia, dysarthria, agnosia, amnesia, paralysis, loss of coordination, or loss of consciousness  Medication Review  HYDROcodone-acetaminophen, Magnesium Gluconate, albuterol, amitriptyline, betamethasone dipropionate, cyclobenzaprine, dicyclomine, docusate sodium, estradiol, fluticasone, gemfibrozil, metFORMIN, metoprolol tartrate, metroNIDAZOLE, montelukast, naloxone, omeprazole, potassium chloride, pregabalin, propranolol ER, rosuvastatin, salmeterol, senna, umeclidinium bromide, and zolpidem  History Review  Allergy: Zoe Sanchez is allergic to prednisone. Drug: Zoe Sanchez  reports no history of drug use. Alcohol:  reports no history of alcohol use. Tobacco:  reports that she has never smoked. She has never used smokeless tobacco. Social: Zoe Sanchez  reports that she has never smoked. She has never used smokeless tobacco. She reports that she does not drink alcohol and does not use drugs. Medical:  has a past medical history of Abnormal heart rhythm, Abnormal mammogram (02/25/2014), Actinic keratosis, Anemia, Anxiety, Asthma, Awareness of heartbeats (06/29/2015), Basal cell carcinoma (04/24/2008), Basal cell carcinoma (04/09/2009), Basal cell carcinoma (11/02/2022), Basal cell carcinoma (BCC) (12/23/2014), Breath shortness (06/29/2015), CAD (coronary artery disease), Cancer (HCC), Cervical radicular pain (Location of Secondary source of pain) (Bilateral) (R>L) (C8 Dermatome)  (06/29/2015), Chronic pain, COPD (chronic obstructive pulmonary disease) (HCC), DDD (degenerative disc disease), cervical, Degenerative disk disease, Depression, Dysrhythmia, Endometriosis, Family history of chronic pain (06/29/2015), Fibromyalgia, Fibromyalgia, GERD (gastroesophageal reflux disease), Heart murmur, Hiatal hernia, History of attempted suicide (06/29/2015), History of hiatal hernia (06/29/2015), History of psychiatric disorder (06/29/2015), History of suicidal ideation (06/29/2015), Hyperlipidemia, Insomnia, Insomnia, Irritable bowel syndrome, 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 Surgical: Zoe Sanchez  has a past surgical history that includes Abdominal hysterectomy (1986); Tonsillectomy (1957); Skin lesion excision; Oophorectomy (1986); ORIF ankle fracture (Left, 03/14/2018); ORIF ankle fracture (Left, 07/26/2018); Ankle surgery; Colonoscopy with propofol (N/A, 06/05/2019); Breast biopsy (Right, 2016); Fracture surgery; and Colonoscopy with propofol (N/A, 03/05/2020). Family: family history includes Breast cancer in her maternal grandmother; Heart disease in her father; Hypertension in her father and mother.  Laboratory Chemistry Profile   Renal Lab Results  Component Value Date   BUN 21 04/26/2019   CREATININE 0.84 04/26/2019   GFRAA >60 04/26/2019   GFRNONAA >60 04/26/2019    Hepatic Lab Results  Component Value Date   AST 35 04/26/2019   ALT 22 04/26/2019   ALBUMIN 4.1 04/26/2019   ALKPHOS 71 04/26/2019    Electrolytes Lab Results  Component Value Date   NA 135 04/26/2019   K 4.5  04/26/2019   CL 103 04/26/2019   CALCIUM 9.4 04/26/2019   MG 1.8 04/26/2019    Bone Lab Results  Component Value Date   VD25OH 40.8 04/26/2019   25OHVITD1 49 03/09/2016   25OHVITD2 <1.0 03/09/2016   25OHVITD3 48 03/09/2016     Inflammation (CRP: Acute Phase) (ESR: Chronic Phase) Lab Results  Component Value Date   CRP 2.2 (H) 04/26/2019   ESRSEDRATE 39 (H) 04/26/2019         Note: Above Lab results reviewed.  Recent Imaging Review  MM 3D SCREENING MAMMOGRAM BILATERAL BREAST CLINICAL DATA:  Screening.  EXAM: DIGITAL SCREENING BILATERAL MAMMOGRAM WITH TOMOSYNTHESIS AND CAD  TECHNIQUE: Bilateral screening digital craniocaudal and mediolateral oblique mammograms were obtained. Bilateral screening digital breast tomosynthesis was performed. The images were evaluated with computer-aided detection.  COMPARISON:  Previous exam(s).  ACR Breast Density Category c: The breasts are heterogeneously dense, which may obscure small masses.  FINDINGS: In the left breast, a possible asymmetry warrants further evaluation. In the right breast, no findings suspicious for malignancy.  IMPRESSION: Further evaluation is suggested for possible asymmetry in the left breast.  RECOMMENDATION: Diagnostic mammogram and possibly ultrasound of the left breast. (Code:[id number])  The patient will be contacted regarding the findings, and additional imaging will be scheduled.  BI-RADS CATEGORY  0: Incomplete: Need additional imaging evaluation.  Electronically Signed   By: Frederico Hamman M.D.   On: 08/07/2023 12:44 Note: Reviewed        Physical Exam  General appearance: Well nourished, well developed, and well hydrated. In no apparent acute distress Mental status: Alert, oriented x 3 (person, place, & time)       Respiratory: No evidence of acute respiratory distress Eyes: PERLA Vitals: BP (!) 150/66   Pulse 79   Temp (!) 97.3 F (36.3 C)   Resp 16   Ht 5\' 3"  (1.6 m)   Wt 170 lb (77.1 kg)   SpO2 99%   BMI 30.11 kg/m  BMI: Estimated body mass index is 30.11 kg/m as calculated from the following:   Height as of this encounter: 5\' 3"  (1.6 m).   Weight as of this encounter: 170 lb (77.1 kg). Ideal: Ideal  body weight: 52.4 kg (115 lb 8.3 oz) Adjusted ideal body weight: 62.3 kg (137 lb 5 oz)  Assessment   Diagnosis Status  1. Chronic bilateral low back pain without sciatica   2. Chronic pain of lower extremity, bilateral   3. Chronic hip pain, bilateral   4. Chronic neck pain   5. Chronic pain of both upper extremities   6. Cervical facet syndrome   7. Lumbar facet joint pain   8. Chronic pain syndrome   9. Pharmacologic therapy   10. Chronic use of opiate for therapeutic purpose   11. Encounter for medication management   12. Encounter for chronic pain management    Controlled Controlled Controlled   Updated Problems: No problems updated.  Plan of Care  Problem-specific:  Assessment and Plan    Chronic Pain Management Their current pain regimen effectively manages their chronic pain, with no side effects or adverse reactions reported. The last urine drug screen was negative, and the Prescription Monitoring Program (PMP) check was satisfactory. We will continue their medication regimen, send three-month prescriptions to the pharmacy, and schedule a follow-up in three months. They have been informed about the new nurse practitioner who will manage their prescriptions and advised to communicate any specific needs to the nurse practitioner or request  an appointment with the doctor if necessary.       Zoe Sanchez has a current medication list which includes the following long-term medication(s): albuterol, amitriptyline, dicyclomine, estradiol, fluticasone, [START ON 08/20/2023] hydrocodone-acetaminophen, [START ON 09/19/2023] hydrocodone-acetaminophen, [START ON 10/19/2023] hydrocodone-acetaminophen, metformin, metoprolol tartrate, montelukast, omeprazole, pregabalin, pregabalin, propranolol er, rosuvastatin, and salmeterol.  Pharmacotherapy (Medications Ordered): Meds ordered this encounter  Medications   HYDROcodone-acetaminophen (NORCO/VICODIN) 5-325 MG tablet    Sig: Take 1  tablet by mouth every 4 (four) hours as needed for severe pain (pain score 7-10). Must last 30 days    Dispense:  180 tablet    Refill:  0    DO NOT: delete (not duplicate); no partial-fill (will deny script to complete), no refill request (F/U required). DISPENSE: 1 day early if closed on fill date. WARN: No CNS-depressants within 8 hrs of med.   HYDROcodone-acetaminophen (NORCO/VICODIN) 5-325 MG tablet    Sig: Take 1 tablet by mouth every 4 (four) hours as needed for severe pain (pain score 7-10). Must last 30 days    Dispense:  180 tablet    Refill:  0    DO NOT: delete (not duplicate); no partial-fill (will deny script to complete), no refill request (F/U required). DISPENSE: 1 day early if closed on fill date. WARN: No CNS-depressants within 8 hrs of med.   HYDROcodone-acetaminophen (NORCO/VICODIN) 5-325 MG tablet    Sig: Take 1 tablet by mouth every 4 (four) hours as needed for severe pain (pain score 7-10). Must last 30 days    Dispense:  180 tablet    Refill:  0    DO NOT: delete (not duplicate); no partial-fill (will deny script to complete), no refill request (F/U required). DISPENSE: 1 day early if closed on fill date. WARN: No CNS-depressants within 8 hrs of med.   Orders:  No orders of the defined types were placed in this encounter.  Follow-up plan:   Return in about 3 months (around 11/18/2023) for Eval-day (M,W), (F2F), (MM), (add to NP list for MM).      Interventional Therapies  Risk Factors  Considerations  Medical Comorbidities:  Depressive disorder  GAD  PSVT  CAD  COPD  Dysrhythmias  GERD  Insomnia     Planned  Pending:      Under consideration:   Diagnostic bilateral lumbar facet MBB #3 (w/o steroids)  Therapeutic bilateral lumbar facet MB RFA #1    Completed:   Diagnostic bilateral lumbar facet MBB x2 (w/o steroids) (04/20/2017) (100/60/60/<25)    Therapeutic  Palliative (PRN) options:   None established   Completed by other providers:   None  reported   Pharmacotherapy  Nonopioids transferred 08/24/2020: Magnesium, Flexeril, Lyrica, melatonin, vitamin B12, and vitamin D3.       Recent Visits No visits were found meeting these conditions. Showing recent visits within past 90 days and meeting all other requirements Today's Visits Date Type Provider Dept  08/16/23 Office Visit Delano Metz, MD [facility]  Showing today's visits and meeting all other requirements Future Appointments No visits were found meeting these conditions. Showing future appointments within next 90 days and meeting all other requirements  I discussed the assessment and treatment plan with the patient. The patient was provided an opportunity to ask questions and all were answered. The patient agreed with the plan and demonstrated an understanding of the instructions.  Patient advised to call back or seek an in-person evaluation if the symptoms or condition worsens.  Duration  of encounter: 30 minutes.  Total time on encounter, as per AMA guidelines included both the face-to-face and non-face-to-face time personally spent by the physician and/or other qualified health care professional(s) on the day of the encounter (includes time in activities that require the physician or other qualified health care professional and does not include time in activities normally performed by clinical staff). Physician's time may include the following activities when performed: Preparing to see the patient (e.g., pre-charting review of records, searching for previously ordered imaging, lab work, and nerve conduction tests) Review of prior analgesic pharmacotherapies. Reviewing PMP Interpreting ordered tests (e.g., lab work, imaging, nerve conduction tests) Performing post-procedure evaluations, including interpretation of diagnostic procedures Obtaining and/or reviewing separately obtained history Performing a medically appropriate examination and/or  evaluation Counseling and educating the patient/family/caregiver Ordering medications, tests, or procedures Referring and communicating with other health care professionals (when not separately reported) Documenting clinical information in the electronic or other health record Independently interpreting results (not separately reported) and communicating results to the patient/ family/caregiver Care coordination (not separately reported)  Note by: Oswaldo Done, MD Date: 08/16/2023; Time: 3:16 PM

## 2023-08-16 NOTE — Patient Instructions (Addendum)
Pain medication pick up dates:  08/20/23 09/19/23 10/19/23 ______________________________________________________________________    Opioid Pain Medication Update  To: All patients taking opioid pain medications. (I.e.: hydrocodone, hydromorphone, oxycodone, oxymorphone, morphine, codeine, methadone, tapentadol, tramadol, buprenorphine, fentanyl, etc.)  Re: Updated review of side effects and adverse reactions of opioid analgesics, as well as new information about long term effects of this class of medications.  Direct risks of long-term opioid therapy are not limited to opioid addiction and overdose. Potential medical risks include serious fractures, breathing problems during sleep, hyperalgesia, immunosuppression, chronic constipation, bowel obstruction, myocardial infarction, and tooth decay secondary to xerostomia.  Unpredictable adverse effects that can occur even if you take your medication correctly: Cognitive impairment, respiratory depression, and death. Most people think that if they take their medication "correctly", and "as instructed", that they will be safe. Nothing could be farther from the truth. In reality, a significant amount of recorded deaths associated with the use of opioids has occurred in individuals that had taken the medication for a long time, and were taking their medication correctly. The following are examples of how this can happen: Patient taking his/her medication for a long time, as instructed, without any side effects, is given a certain antibiotic or another unrelated medication, which in turn triggers a "Drug-to-drug interaction" leading to disorientation, cognitive impairment, impaired reflexes, respiratory depression or an untoward event leading to serious bodily harm or injury, including death.  Patient taking his/her medication for a long time, as instructed, without any side effects, develops an acute impairment of liver and/or kidney function. This will lead to  a rapid inability of the body to breakdown and eliminate their pain medication, which will result in effects similar to an "overdose", but with the same medicine and dose that they had always taken. This again may lead to disorientation, cognitive impairment, impaired reflexes, respiratory depression or an untoward event leading to serious bodily harm or injury, including death.  A similar problem will occur with patients as they grow older and their liver and kidney function begins to decrease as part of the aging process.  Background information: Historically, the original case for using long-term opioid therapy to treat chronic noncancer pain was based on safety assumptions that subsequent experience has called into question. In 1996, the American Pain Society and the American Academy of Pain Medicine issued a consensus statement supporting long-term opioid therapy. This statement acknowledged the dangers of opioid prescribing but concluded that the risk for addiction was low; respiratory depression induced by opioids was short-lived, occurred mainly in opioid-naive patients, and was antagonized by pain; tolerance was not a common problem; and efforts to control diversion should not constrain opioid prescribing. This has now proven to be wrong. Experience regarding the risks for opioid addiction, misuse, and overdose in community practice has failed to support these assumptions.  According to the Centers for Disease Control and Prevention, fatal overdoses involving opioid analgesics have increased sharply over the past decade. Currently, more than 96,700 people die from drug overdoses every year. Opioids are a factor in 7 out of every 10 overdose deaths. Deaths from drug overdose have surpassed motor vehicle accidents as the leading cause of death for individuals between the ages of 31 and 12.  Clinical data suggest that neuroendocrine dysfunction may be very common in both men and women, potentially  causing hypogonadism, erectile dysfunction, infertility, decreased libido, osteoporosis, and depression. Recent studies linked higher opioid dose to increased opioid-related mortality. Controlled observational studies reported that long-term opioid therapy may  be associated with increased risk for cardiovascular events. Subsequent meta-analysis concluded that the safety of long-term opioid therapy in elderly patients has not been proven.   Side Effects and adverse reactions: Common side effects: Drowsiness (sedation). Dizziness. Nausea and vomiting. Constipation. Physical dependence -- Dependence often manifests with withdrawal symptoms when opioids are discontinued or decreased. Tolerance -- As you take repeated doses of opioids, you require increased medication to experience the same effect of pain relief. Respiratory depression -- This can occur in healthy people, especially with higher doses. However, people with COPD, asthma or other lung conditions may be even more susceptible to fatal respiratory impairment.  Uncommon side effects: An increased sensitivity to feeling pain and extreme response to pain (hyperalgesia). Chronic use of opioids can lead to this. Delayed gastric emptying (the process by which the contents of your stomach are moved into your small intestine). Muscle rigidity. Immune system and hormonal dysfunction. Quick, involuntary muscle jerks (myoclonus). Arrhythmia. Itchy skin (pruritus). Dry mouth (xerostomia).  Long-term side effects: Chronic constipation. Sleep-disordered breathing (SDB). Increased risk of bone fractures. Hypothalamic-pituitary-adrenal dysregulation. Increased risk of overdose.  RISKS: Respiratory depression and death: Opioids increase the risk of respiratory depression and death.  Drug-to-drug interactions: Opioids are relatively contraindicated in combination with benzodiazepines, sleep inducers, and other central nervous system depressants.  Other classes of medications (i.e.: certain antibiotics and even over-the-counter medications) may also trigger or induce respiratory depression in some patients.  Medical conditions: Patients with pre-existing respiratory problems are at higher risk of respiratory failure and/or depression when in combination with opioid analgesics. Opioids are relatively contraindicated in some medical conditions such as central sleep apnea.   Fractures and Falls:  Opioids increase the risk and incidence of falls. This is of particular importance in elderly patients.  Endocrine System:  Long-term administration is associated with endocrine abnormalities (endocrinopathies). (Also known as Opioid-induced Endocrinopathy) Influences on both the hypothalamic-pituitary-adrenal axis?and the hypothalamic-pituitary-gonadal axis have been demonstrated with consequent hypogonadism and adrenal insufficiency in both sexes. Hypogonadism and decreased levels of dehydroepiandrosterone sulfate have been reported in men and women. Endocrine effects include: Amenorrhoea in women (abnormal absence of menstruation) Reduced libido in both sexes Decreased sexual function Erectile dysfunction in men Hypogonadisms (decreased testicular function with shrinkage of testicles) Infertility Depression and fatigue Loss of muscle mass Anxiety Depression Immune suppression Hyperalgesia Weight gain Anemia Osteoporosis Patients (particularly women of childbearing age) should avoid opioids. There is insufficient evidence to recommend routine monitoring of asymptomatic patients taking opioids in the long-term for hormonal deficiencies.  Immune System: Human studies have demonstrated that opioids have an immunomodulating effect. These effects are mediated via opioid receptors both on immune effector cells and in the central nervous system. Opioids have been demonstrated to have adverse effects on antimicrobial response and anti-tumour  surveillance. Buprenorphine has been demonstrated to have no impact on immune function.  Opioid Induced Hyperalgesia: Human studies have demonstrated that prolonged use of opioids can lead to a state of abnormal pain sensitivity, sometimes called opioid induced hyperalgesia (OIH). Opioid induced hyperalgesia is not usually seen in the absence of tolerance to opioid analgesia. Clinically, hyperalgesia may be diagnosed if the patient on long-term opioid therapy presents with increased pain. This might be qualitatively and anatomically distinct from pain related to disease progression or to breakthrough pain resulting from development of opioid tolerance. Pain associated with hyperalgesia tends to be more diffuse than the pre-existing pain and less defined in quality. Management of opioid induced hyperalgesia requires opioid dose reduction.  Cancer: Chronic  opioid therapy has been associated with an increased risk of cancer among noncancer patients with chronic pain. This association was more evident in chronic strong opioid users. Chronic opioid consumption causes significant pathological changes in the small intestine and colon. Epidemiological studies have found that there is a link between opium dependence and initiation of gastrointestinal cancers. Cancer is the second leading cause of death after cardiovascular disease. Chronic use of opioids can cause multiple conditions such as GERD, immunosuppression and renal damage as well as carcinogenic effects, which are associated with the incidence of cancers.   Mortality: Long-term opioid use has been associated with increased mortality among patients with chronic non-cancer pain (CNCP).  Prescription of long-acting opioids for chronic noncancer pain was associated with a significantly increased risk of all-cause mortality, including deaths from causes other than overdose.  Reference: Von Korff M, Kolodny A, Deyo RA, Chou R. Long-term opioid therapy  reconsidered. Ann Intern Med. 2011 Sep 6;155(5):325-8. doi: 10.7326/[id number]. PMID: 24401027; PMCID: [id number]. Randon Goldsmith, Hayward RA, Dunn KM, [place] KP. Risk of adverse events in patients prescribed long-term opioids: A cohort study in the [place] Clinical Practice Research Datalink. Eur J Pain. 2019 May;23(5):908-922. doi: 10.1002/ejp.1357. Epub 2019 Jan 31. PMID: 74259563. Colameco S, Coren JS, Ciervo CA. Continuous opioid treatment for chronic noncancer pain: a time for moderation in prescribing. Postgrad Med. 2009 Jul;121(4):61-6. doi: 10.3810/pgm.2009.07.2032. PMID: 87564332. William Hamburger RN, Sneads SD, Blazina I, Cristopher Peru, Bougatsos C, Deyo RA. The effectiveness and risks of long-term opioid therapy for chronic pain: a systematic review for a [employer] of Health Pathways to [employer]. Ann Intern Med. 2015 Feb 17;162(4):276-86. doi: 10.7326/M14-2559. PMID: 95188416. Caryl Bis [facility], Makuc DM. NCHS Data Brief No. 22. [place]: Centers for Disease Control and Prevention; 2009. Sep, Increase in Fatal Poisonings Involving Opioid Analgesics in the [place], 1999-2006. Song IA, Choi HR, Oh TK. Long-term opioid use and mortality in patients with chronic non-cancer pain: Ten-year follow-up study in [place] from 2010 through 2019. EClinicalMedicine. 2022 Jul 18;51:101558. doi: 10.1016/j.eclinm.2022.606301. PMID: 60109323; PMCID: [id number]. Huser, W., Schubert, T., Vogelmann, T. et al. All-cause mortality in patients with long-term opioid therapy compared with non-opioid analgesics for chronic non-cancer pain: a database study. BMC Med 18, 162 (2020). http://lester.info/ Rashidian H, Karie Kirks, Malekzadeh R, Haghdoost AA. An Ecological Study of the Association between Opiate Use and Incidence of Cancers. Addict Health. 2016 Fall;8(4):252-260. PMID: 27062376; PMCID: [id number].  Our  Goal: Our goal is to control your pain with means other than the use of opioid pain medications.  Our Recommendation: Talk to your physician about coming off of these medications. We can assist you with the tapering down and stopping these medicines. Based on the new information, even if you cannot completely stop the medication, a decrease in the dose may be associated with a lesser risk. Ask for other means of controlling the pain. Decrease or eliminate those factors that significantly contribute to your pain such as smoking, obesity, and a diet heavily tilted towards "inflammatory" nutrients.  Last Updated: 03/20/2023   ______________________________________________________________________       ______________________________________________________________________    National Pain Medication Shortage  The [place] is experiencing worsening drug shortages. These have had a negative widespread effect on patient care and treatment. Not expected to improve any time soon. Predicted to last past 2029.   Drug shortage list (generic names) Oxycodone IR Oxycodone/APAP Oxymorphone IR Hydromorphone Hydrocodone/APAP Morphine  Where is the problem?  Manufacturing and supply level.  Will this shortage affect you?  Only if you take any of the above pain medications.  How? You may be unable to fill your prescription.  Your pharmacist may offer a "partial fill" of your prescription. (Warning: Do not accept partial fills.) Prescriptions partially filled cannot be transferred to another pharmacy. Read our Medication Rules and Regulation. Depending on how much medicine you are dependent on, you may experience withdrawals when unable to get the medication.  Recommendations: Consider ending your dependence on opioid pain medications. Ask your pain specialist to assist you with the process. Consider switching to a medication currently not in shortage, such as Buprenorphine. Talk to your pain  specialist about this option. Consider decreasing your pain medication requirements by managing tolerance thru "Drug Holidays". This may help minimize withdrawals, should you run out of medicine. Control your pain thru the use of non-pharmacological interventional therapies.   Your prescriber: Prescribers cannot be blamed for shortages. Medication manufacturing and supply issues cannot be fixed by the prescriber.   NOTE: The prescriber is not responsible for supplying the medication, or solving supply issues. Work with your pharmacist to solve it. The patient is responsible for the decision to take or continue taking the medication and for identifying and securing a legal supply source. By law, supplying the medication is the job and responsibility of the pharmacy. The prescriber is responsible for the evaluation, monitoring, and prescribing of these medications.   Prescribers will NOT: Re-issue prescriptions that have been partially filled. Re-issue prescriptions already sent to a pharmacy.  Re-send prescriptions to a different pharmacy because yours did not have your medication. Ask pharmacist to order more medicine or transfer the prescription to another pharmacy. (Read below.)  New 2023 regulation: "May 13, 2022 Revised Regulation Allows DEA-Registered Pharmacies to Transfer Electronic Prescriptions at a Patient's Request DEA Headquarters Division - Public Information Office Patients now have the ability to request their electronic prescription be transferred to another pharmacy without having to go back to their practitioner to initiate the request. This revised regulation went into effect on Monday, May 09, 2022.     At a patient's request, a DEA-registered retail pharmacy can now transfer an electronic prescription for a controlled substance (schedules II-V) to another DEA-registered retail pharmacy. Prior to this change, patients would have to go through their practitioner to  cancel their prescription and have it re-issued to a different pharmacy. The process was taxing and time consuming for both patients and practitioners.    The Drug Enforcement Administration [facility]) published its intent to revise the process for transferring electronic prescriptions on July 31, 2020.  The final rule was published in the federal register on April 07, 2022 and went into effect 30 days later.  Under the final rule, a prescription can only be transferred once between pharmacies, and only if allowed under existing state or other applicable law. The prescription must remain in its electronic form; may not be altered in any way; and the transfer must be communicated directly between two licensed pharmacists. It's important to note, any authorized refills transfer with the original prescription, which means the entire prescription will be filled at the same pharmacy".  Reference: HugeHand.is [facility] website announcement)  [email or web address] [employer] of Justice)   [employer] / Vol. 88, No. 143 / Thursday, April 07, 2022 / Rules and Regulations DEPARTMENT OF JUSTICE  Drug Enforcement Administration  21 CFR Part 1306  [Docket No. [id number]]  RIN [id number] Transfer of  Electronic Prescriptions for Schedules II-V Controlled Substances Between Pharmacies for Initial Filling  ______________________________________________________________________       ______________________________________________________________________    Transfer of Pain Medication between Pharmacies  Re: 2023 DEA Clarification on existing regulation  Published on DEA Website: May 13, 2022  Title: Revised Regulation Allows DEA-Registered Pharmacies to Electrical engineer Prescriptions at a Patient's Request DEA Headquarters Division -  [employer]  "Patients now have the ability to request their electronic prescription be transferred to another pharmacy without having to go back to their practitioner to initiate the request. This revised regulation went into effect on Monday, May 09, 2022.     At a patient's request, a DEA-registered retail pharmacy can now transfer an electronic prescription for a controlled substance (schedules II-V) to another DEA-registered retail pharmacy. Prior to this change, patients would have to go through their practitioner to cancel their prescription and have it re-issued to a different pharmacy. The process was taxing and time consuming for both patients and practitioners.    The Drug Enforcement Administration [facility]) published its intent to revise the process for transferring electronic prescriptions on July 31, 2020.  The final rule was published in the federal register on April 07, 2022 and went into effect 30 days later.  Under the final rule, a prescription can only be transferred once between pharmacies, and only if allowed under existing state or other applicable law. The prescription must remain in its electronic form; may not be altered in any way; and the transfer must be communicated directly between two licensed pharmacists. It's important to note, any authorized refills transfer with the original prescription, which means the entire prescription will be filled at the same pharmacy."    REFERENCES: 1. DEA website announcement HugeHand.[email or web address]  2. Department of Justice website  [email or web address].pdf  3. DEPARTMENT OF JUSTICE Drug Enforcement Administration 21 CFR Part 1306 [Docket No. [id number]] RIN [id number] "Transfer of Electronic Prescriptions for Schedules II-V Controlled Substances Between Pharmacies for Initial  Filling"  ______________________________________________________________________       ______________________________________________________________________    Medication Rules  Purpose: To inform patients, and their family members, of our medication rules and regulations.  Applies to: All patients receiving prescriptions from our practice (written or electronic).  Pharmacy of record: This is the pharmacy where your electronic prescriptions will be sent. Make sure we have the correct one.  Electronic prescriptions: In compliance with the [place] [place] Strengthen Opioid Misuse Prevention (STOP) Act of 2017 (Session Conni Elliot [phone]), effective September 12, 2018, all controlled substances must be electronically prescribed. Written prescriptions, faxing, or calling prescriptions to a pharmacy will no longer be done.  Prescription refills: These will be provided only during in-person appointments. No medications will be renewed without a "face-to-face" evaluation with your provider. Applies to all prescriptions.  NOTE: The following applies primarily to controlled substances (Opioid* Pain Medications).   Type of encounter (visit): For patients receiving controlled substances, face-to-face visits are required. (Not an option and not up to the patient.)  Patient's Responsibilities: Pain Pills: Bring all pain pills to every appointment (except for procedure appointments). Pill counts are required.  Pill Bottles: Bring pills in original pharmacy bottle. Bring bottle, even if empty. Always bring the bottle of the most recent fill.  Medication refills: You are responsible for knowing and keeping track of what medications you are taking and when is it that you will need a refill. The day before your appointment: write a list of all prescriptions that need to be refilled. The  day of the appointment: give the list to the admitting nurse. Prescriptions will be written only during appointments. No  prescriptions will be written on procedure days. If you forget a medication: it will not be "Called in", "Faxed", or "electronically sent". You will need to get another appointment to get these prescribed. No early refills. Do not call asking to have your prescription filled early. Partial  or short prescriptions: Occasionally your pharmacy may not have enough pills to fill your prescription.  NEVER ACCEPT a partial fill or a prescription that is short of the total amount of pills that you were prescribed.  With controlled substances the law allows 72 hours for the pharmacy to complete the prescription.  If the prescription is not completed within 72 hours, the pharmacist will require a new prescription to be written. This means that you will be short on your medicine and we WILL NOT send another prescription to complete your original prescription.  Instead, request the pharmacy to send a carrier to a nearby branch to get enough medication to provide you with your full prescription. Prescription Accuracy: You are responsible for carefully inspecting your prescriptions before leaving our office. Have the discharge nurse carefully go over each prescription with you, before taking them home. Make sure that your name is accurately spelled, that your address is correct. Check the name and dose of your medication to make sure it is accurate. Check the number of pills, and the written instructions to make sure they are clear and accurate. Make sure that you are given enough medication to last until your next medication refill appointment. Taking Medication: Take medication as prescribed. When it comes to controlled substances, taking less pills or less frequently than prescribed is permitted and encouraged. Never take more pills than instructed. Never take the medication more frequently than prescribed.  Inform other Doctors: Always inform, all of your healthcare providers, of all the medications you take. Pain  Medication from other Providers: You are not allowed to accept any additional pain medication from any other Doctor or Healthcare provider. There are two exceptions to this rule. (see below) In the event that you require additional pain medication, you are responsible for notifying us, as stated below. Cough Medicine: Often these contain an opioid, such as codeine or hydrocodone. Never accept or take cough medicine containing these opioids if you are already taking an opioid* medication. The combination may cause respiratory failure and death. Medication Agreement: You are responsible for carefully reading and following our Medication Agreement. This must be signed before receiving any prescriptions from our practice. Safely store a copy of your signed Agreement. Violations to the Agreement will result in no further prescriptions. (Additional copies of our Medication Agreement are available upon request.) Laws, Rules, & Regulations: All patients are expected to follow all [address] and [employer], Rules,  [place]. Ignorance of the Laws does not constitute a valid excuse.  Illegal drugs and Controlled Substances: The use of illegal substances (including, but not limited to marijuana and its derivatives) and/or the illegal use of any controlled substances is strictly prohibited. Violation of this rule may result in the immediate and permanent discontinuation of any and all prescriptions being written by our practice. The use of any illegal substances is prohibited. Adopted CDC guidelines & recommendations: Target dosing levels will be at or below 60 MME/day. Use of benzodiazepines** is not recommended. Urine Drug testing: Patients taking controlled substances will be required to provide a urine sample upon request. Do not  void before coming to your medication management appointments. Hold emptying your bladder until you are admitted. The admitting nurse will inform you if a sample is required. Our  practice reserves the right to call you at any time to provide a sample. Once receiving the call, you have 24 hours to comply with request. Not providing a sample upon request may result in termination of medication therapy.  Exceptions: There are only two exceptions to the rule of not receiving pain medications from other Healthcare Providers. Exception #1 (Emergencies): In the event of an emergency (i.e.: accident requiring emergency care), you are allowed to receive additional pain medication. However, you are responsible for: As soon as you are able, call our office [phone], at any time of the day or night, and leave a message stating your name, the date and nature of the emergency, and the name and dose of the medication prescribed. In the event that your call is answered by a member of our staff, make sure to document and save the date, time, and the name of the person that took your information.  Exception #2 (Planned Surgery): In the event that you are scheduled by another doctor or dentist to have any type of surgery or procedure, you are allowed (for a period no longer than 30 days), to receive additional pain medication, for the acute post-op pain. However, in this case, you are responsible for picking up a copy of our "Post-op Pain Management for Surgeons" handout, and giving it to your surgeon or dentist. This document is available at our office, and does not require an appointment to obtain it. Simply go to our office during business hours (Monday-Thursday from 8:00 AM to 4:00 PM) (Friday 8:00 AM to 12:00 Noon) or if you have a scheduled appointment with [place], prior to your surgery, and ask for it by name. In addition, you are responsible for: calling our office [phone], at any time of the day or night, and leaving a message stating your name, name of your surgeon, type of surgery, and date of procedure or surgery. Failure to comply with your responsibilities may result in termination  of therapy involving the controlled substances.  Consequences:  Non-compliance with the above rules may result in permanent discontinuation of medication prescription therapy. All patients receiving any type of controlled substance is expected to comply with the above patient responsibilities. Not doing so may result in permanent discontinuation of medication prescription therapy. Medication Agreement Violation. Following the above rules, including your responsibilities will help you in avoiding a Medication Agreement Violation ("Breaking your Pain Medication Contract").  *Opioid medications include: morphine, codeine, oxycodone, oxymorphone, hydrocodone, hydromorphone, meperidine, tramadol, tapentadol, buprenorphine, fentanyl, methadone. **Benzodiazepine medications include: diazepam (Valium), alprazolam (Xanax), clonazepam (Klonopine), lorazepam (Ativan), clorazepate (Tranxene), chlordiazepoxide (Librium), estazolam (Prosom), oxazepam (Serax), temazepam (Restoril), triazolam (Halcion) (Last updated: 07/05/2023) ______________________________________________________________________      ______________________________________________________________________    Medication Recommendations and Reminders  Applies to: All patients receiving prescriptions (written and/or electronic).  Medication Rules & Regulations: You are responsible for reading, knowing, and following our "Medication Rules" document. These exist for your safety and that of others. They are not flexible and neither are we. Dismissing or ignoring them is an act of "non-compliance" that may result in complete and irreversible termination of such medication therapy. For safety reasons, "non-compliance" will not be tolerated. As with the [place] fundamental legal principle of "ignorance of the law is no defense", we will accept no excuses for not having read and knowing  the content of documents provided to you by our practice.  Pharmacy of  record:  Definition: This is the pharmacy where your electronic prescriptions will be sent.  We do not endorse any particular pharmacy. It is up to you and your insurance to decide what pharmacy to use.  We do not restrict you in your choice of pharmacy. However, once we write for your prescriptions, we will NOT be re-sending more prescriptions to fix restricted supply problems created by your pharmacy, or your insurance.  The pharmacy listed in the electronic medical record should be the one where you want electronic prescriptions to be sent. If you choose to change pharmacy, simply notify our nursing staff. Changes will be made only during your regular appointments and not over the phone.  Recommendations: Keep all of your pain medications in a safe place, under lock and key, even if you live alone. We will NOT replace lost, stolen, or damaged medication. We do not accept "Police Reports" as proof of medications having been stolen. After you fill your prescription, take 1 week's worth of pills and put them away in a safe place. You should keep a separate, properly labeled bottle for this purpose. The remainder should be kept in the original bottle. Use this as your primary supply, until it runs out. Once it's gone, then you know that you have 1 week's worth of medicine, and it is time to come in for a prescription refill. If you do this correctly, it is unlikely that you will ever run out of medicine. To make sure that the above recommendation works, it is very important that you make sure your medication refill appointments are scheduled at least 1 week before you run out of medicine. To do this in an effective manner, make sure that you do not leave the office without scheduling your next medication management appointment. Always ask the nursing staff to show you in your prescription , when your medication will be running out. Then arrange for the receptionist to get you a return appointment, at least  7 days before you run out of medicine. Do not wait until you have 1 or 2 pills left, to come in. This is very poor planning and does not take into consideration that we may need to cancel appointments due to bad weather, sickness, or emergencies affecting our staff. DO NOT ACCEPT A "Partial Fill": If for any reason your pharmacy does not have enough pills/tablets to completely fill or refill your prescription, do not allow for a "partial fill". The law allows the pharmacy to complete that prescription within 72 hours, without requiring a new prescription. If they do not fill the rest of your prescription within those 72 hours, you will need a separate prescription to fill the remaining amount, which we will NOT provide. If the reason for the partial fill is your insurance, you will need to talk to the pharmacist about payment alternatives for the remaining tablets, but again, DO NOT ACCEPT A PARTIAL FILL, unless you can trust your pharmacist to obtain the remainder of the pills within 72 hours.  Prescription refills and/or changes in medication(s):  Prescription refills, and/or changes in dose or medication, will be conducted only during scheduled medication management appointments. (Applies to both, written and electronic prescriptions.) No refills on procedure days. No medication will be changed or started on procedure days. No changes, adjustments, and/or refills will be conducted on a procedure day. Doing so will interfere with the diagnostic portion of the  procedure. No phone refills. No medications will be "called into the pharmacy". No Fax refills. No weekend refills. No Holliday refills. No after hours refills.  Remember:  Business hours are:  Monday to Thursday 8:00 AM to 4:00 PM Provider's Schedule: Delano Metz, MD - Appointments are:  Medication management: Monday and Wednesday 8:00 AM to 4:00 PM Procedure day: Tuesday and Thursday 7:30 AM to 4:00 PM Edward Jolly, MD -  Appointments are:  Medication management: Tuesday and Thursday 8:00 AM to 4:00 PM Procedure day: Monday and Wednesday 7:30 AM to 4:00 PM (Last update: 07/05/2022) ______________________________________________________________________      ______________________________________________________________________     Naloxone Nasal Spray  Why am I receiving this medication? [place] STOP ACT requires that all patients taking high dose opioids or at risk of opioids respiratory depression, be prescribed an opioid reversal agent, such as Naloxone (AKA: Narcan).  What is this medication? NALOXONE (nal OX one) treats opioid overdose, which causes slow or shallow breathing, severe drowsiness, or trouble staying awake. Call emergency services after using this medication. You may need additional treatment. Naloxone works by reversing the effects of opioids. It belongs to a group of medications called opioid blockers.  COMMON BRAND NAME(S): Kloxxado, Narcan  What should I tell my care team before I take this medication? They need to know if you have any of these conditions: Heart disease Substance use disorder An unusual or allergic reaction to naloxone, other medications, foods, dyes, or preservatives Pregnant or trying to get pregnant Breast-feeding  When to use this medication? This medication is to be used for the treatment of respiratory depression (less than 8 breaths per minute) secondary to opioid overdose.   How to use this medication? This medication is for use in the nose. Lay the person on their back. Support their neck with your hand and allow the head to tilt back before giving the medication. The nasal spray should be given into 1 nostril. After giving the medication, move the person onto their side. Do not remove or test the nasal spray until ready to use. Get emergency medical help right away after giving the first dose of this medication, even if the person wakes up. You  should be familiar with how to recognize the signs and symptoms of a narcotic overdose. If more doses are needed, give the additional dose in the other nostril. Talk to your care team about the use of this medication in children. While this medication may be prescribed for children as young as newborns for selected conditions, precautions do apply.  Naloxone Overdosage: If you think you have taken too much of this medicine contact a poison control center or emergency room at once.  NOTE: This medicine is only for you. Do not share this medicine with others.  What if I miss a dose? This does not apply.  What may interact with this medication? This is only used during an emergency. No interactions are expected during emergency use. This list may not describe all possible interactions. Give your health care provider a list of all the medicines, herbs, non-prescription drugs, or dietary supplements you use. Also tell them if you smoke, drink alcohol, or use illegal drugs. Some items may interact with your medicine.  What should I watch for while using this medication? Keep this medication ready for use in the case of an opioid overdose. Make sure that you have the phone number of your care team and local hospital ready. You may need to have additional  doses of this medication. Each nasal spray contains a single dose. Some emergencies may require additional doses. After use, bring the treated person to the nearest hospital or call 911. Make sure the treating care team knows that the person has received a dose of this medication. You will receive additional instructions on what to do during and after use of this medication before an emergency occurs.  What side effects may I notice from receiving this medication? Side effects that you should report to your care team as soon as possible: Allergic reactions--skin rash, itching, hives, swelling of the face, lips, tongue, or throat Side effects that  usually do not require medical attention (report these to your care team if they continue or are bothersome): Constipation Dryness or irritation inside the nose Headache Increase in blood pressure Muscle spasms Stuffy nose Toothache This list may not describe all possible side effects. Call your doctor for medical advice about side effects. You may report side effects to FDA at 1-800-FDA-1088.  Where should I keep my medication? Because this is an emergency medication, you should keep it with you at all times.  Keep out of the reach of children and pets. Store between 20 and 25 degrees C (68 and 77 degrees F). Do not freeze. Throw away any unused medication after the expiration date. Keep in original box until ready to use.  NOTE: This sheet is a summary. It may not cover all possible information. If you have questions about this medicine, talk to your doctor, pharmacist, or health care provider.   2023 Elsevier/Gold Standard (2021-05-07 00:00:00)  ______________________________________________________________________

## 2023-08-16 NOTE — Progress Notes (Signed)
Nursing Pain Medication Assessment:  Safety precautions to be maintained throughout the outpatient stay will include: orient to surroundings, keep bed in low position, maintain call bell within reach at all times, provide assistance with transfer out of bed and ambulation.  Medication Inspection Compliance: Pill count conducted under aseptic conditions, in front of the patient. Neither the pills nor the bottle was removed from the patient's sight at any time. Once count was completed pills were immediately returned to the patient in their original bottle.  Medication: Hydrocodone/APAP Pill/Patch Count:  174 of 180 pills remain Pill/Patch Appearance: Markings consistent with prescribed medication Bottle Appearance: Standard pharmacy container. Clearly labeled. Filled Date: 47 / 25 / 2024 Last Medication intake:  Today

## 2023-11-14 NOTE — Patient Instructions (Signed)

## 2023-11-14 NOTE — Progress Notes (Unsigned)
 PROVIDER NOTE: Information contained herein reflects review and annotations entered in association with encounter. Interpretation of such information and data should be left to medically-trained personnel. Information provided to patient can be located elsewhere in the medical record under "Patient Instructions". Document created using STT-dictation technology, any transcriptional errors that may result from process are unintentional.    Patient: Zoe Sanchez  Service Category: E/M  Provider: Oswaldo Done, MD  DOB: September 20, 1955  DOS: 11/15/2023  Referring Provider: Hillery Aldo, MD  MRN: [id number]  Specialty: Interventional Pain Management  PCP: Hillery Aldo, MD  Type: Established Patient  Setting: Ambulatory outpatient    Location: Office  Delivery: Face-to-face     HPI  Zoe Sanchez, a 68 y.o. year old female, is here today because of her No primary diagnosis found.. Zoe Sanchez primary complain today is No chief complaint on file.  Pertinent problems: Zoe Sanchez has Myofascial pain syndrome; Cervical facet syndrome (Bilateral) (R>L); Chronic neck pain (4th area of Pain) (Bilateral) (R>L); Chronic pain syndrome; Cervical spondylosis; Fibromyalgia; Cervical herniated disc (C5-6 and C6-7); Cervical foraminal stenosis (Bilateral) (C5-6); Chronic cervical radicular pain (Bilateral) (R>L) (C8 Dermatome); Chronic low back pain (Bilateral) (L>R) w/ sciatica (Bilateral) (L>R); Spondylosis of lumbar spine; Family history of chronic pain; Carpal tunnel syndrome (Left); Chronic shoulder pain (Bilateral); Cervical facet arthropathy; Lumbar facet arthropathy; Bimalleolar fracture of left ankle; Lumbar facet syndrome (Bilateral) (R>L); Chronic upper extremity pain (5th area of Pain) (Bilateral) (R>L); Chronic lower extremity pain (2ry area of Pain) (Bilateral) (L>R); Chronic lumbar radicular pain (Bilateral) (S1); Arthropathy of lumbar facet joint; Cervical syndrome; Lumbar spondylosis; Synovial cyst of  lumbar spine; Prolapsed cervical intervertebral disc; Lumbar facet joint pain; Cervical radiculopathy; Stenosis of intervertebral foramina; Chronic hip pain (3ry area of Pain) (Bilateral); Shoulder pain; Chronic musculoskeletal pain; Neurogenic pain; Closed trimalleolar fracture of left ankle; Bilateral lower extremity edema; Chronic pain of multiple sites; Closed fracture of fifth metatarsal bone; Lumbosacral sensory radiculopathy at S1 (Bilateral); Decreased range of motion of lumbar spine; Numbness and tingling of lower extremity (Bilateral); Chronic low back pain (1ry area of Pain) (Bilateral) w/o sciatica; Abnormal MRI, lumbar spine (12/25/2022); Spondylosis without myelopathy or radiculopathy, lumbosacral region; Lumbar facet hypertrophy (Multilevel) (Bilateral); and Lumbar disc extrusion (Left: L3-4) on their pertinent problem list. Pain Assessment: Severity of   is reported as a  /10. Location:    / . Onset:  . Quality:  . Timing:  . Modifying factor(s):  Marland Kitchen Vitals:  vitals were not taken for this visit.  BMI: Estimated body mass index is 30.11 kg/m as calculated from the following:   Height as of 08/16/23: 5\' 3"  (1.6 m).   Weight as of 08/16/23: 170 lb (77.1 kg). Last encounter: 08/16/2023. Last procedure: Visit date not found.  Reason for encounter: medication management. ***  Discussed the use of AI scribe software for clinical note transcription with the patient, who gave verbal consent to proceed.  History of Present Illness           Pharmacotherapy Assessment  Analgesic: Hydrocodone/APAP 5/325, 1 tab PO q 4 hrs (30 mg/day of hydrocodone)(1,950 mg/day of acetaminophen) MME/day: 30 mg/day.   Monitoring: [place] PMP: PDMP reviewed during this encounter.       Pharmacotherapy: No side-effects or adverse reactions reported. Compliance: No problems identified. Effectiveness: Clinically acceptable.  No notes on file  No results found for: "CBDTHCR" No results found for: "D8THCCBX" No  results found for: "D9THCCBX"  UDS:  Summary  Date Value Ref  Range Status  02/15/2023 Note  Final    Comment:    ==================================================================== [employer] [facility] (MW) ==================================================================== Test                             Result       Flag       Units  Drug Present and Declared for Prescription Verification   Hydrocodone                    5627         EXPECTED   ng/mg creat   Hydromorphone                  234          EXPECTED   ng/mg creat   Dihydrocodeine                 398          EXPECTED   ng/mg creat   Norhydrocodone                 1403         EXPECTED   ng/mg creat    Sources of hydrocodone include scheduled prescription medications.    Hydromorphone, dihydrocodeine and norhydrocodone are expected    metabolites of hydrocodone. Hydromorphone and dihydrocodeine are    also available as scheduled prescription medications.  ==================================================================== Test                      Result    Flag   Units      Ref Range   Creatinine              190              mg/dL      >=16 ==================================================================== Declared Medications:  The flagging and interpretation on this report are based on the  following declared medications.  Unexpected results may arise from  inaccuracies in the declared medications.   **Note: The testing scope of this panel includes these medications:   Hydrocodone (Norco)   **Note: The testing scope of this panel does not include the  following reported medications:   Acetaminophen (Norco)  Albuterol (Ventolin HFA)  Amitriptyline (Elavil)  Betamethasone  Cyclobenzaprine (Flexeril)  Dicyclomine (Bentyl)  Docusate (Colace)  Estradiol (Estrace)  Fluticasone (Flonase)  Gemfibrozil (Lopid)  Magnesium  Metformin (Glucophage)  Metoprolol (Lopressor)  Metronidazole (Flagyl)  Montelukast  (Singulair)  Naloxone (Narcan)  Omeprazole (Prilosec)  Potassium (Klor-Con)  Pregabalin (Lyrica)  Propranolol (Inderal)  Rosuvastatin (Crestor)  Salmeterol  Sennosides (Senokot)  Umeclidinium (Incruse Ellipta)  Zolpidem (Ambien) ==================================================================== For clinical consultation, please call [phone]. ====================================================================       ROS  Constitutional: Denies any fever or chills Gastrointestinal: No reported hemesis, hematochezia, vomiting, or acute GI distress Musculoskeletal: Denies any acute onset joint swelling, redness, loss of ROM, or weakness Neurological: No reported episodes of acute onset apraxia, aphasia, dysarthria, agnosia, amnesia, paralysis, loss of coordination, or loss of consciousness  Medication Review  HYDROcodone-acetaminophen, Magnesium Gluconate, albuterol, amitriptyline, betamethasone dipropionate, cyclobenzaprine, dicyclomine, docusate sodium, estradiol, fluticasone, gemfibrozil, metFORMIN, metoprolol tartrate, metroNIDAZOLE, montelukast, naloxone, omeprazole, potassium chloride, pregabalin, propranolol ER, rosuvastatin, salmeterol, senna, umeclidinium bromide, and zolpidem  History Review  Allergy: Zoe Sanchez is allergic to prednisone. Drug: Zoe Sanchez  reports no history of drug use. Alcohol:  reports no history of alcohol use. Tobacco:  reports that  she has never smoked. She has never used smokeless tobacco. Social: Zoe Sanchez  reports that she has never smoked. She has never used smokeless tobacco. She reports that she does not drink alcohol and does not use drugs. Medical:  has a past medical history of Abnormal heart rhythm, Abnormal mammogram (02/25/2014), Actinic keratosis, Anemia, Anxiety, Asthma, Awareness of heartbeats (06/29/2015), Basal cell carcinoma (04/24/2008), Basal cell carcinoma (04/09/2009), Basal cell carcinoma (11/02/2022), Basal cell  carcinoma (BCC) (12/23/2014), Breath shortness (06/29/2015), CAD (coronary artery disease), Cancer (HCC), Cervical radicular pain (Location of Secondary source of pain) (Bilateral) (R>L) (C8 Dermatome) (06/29/2015), Chronic pain, COPD (chronic obstructive pulmonary disease) (HCC), DDD (degenerative disc disease), cervical, Degenerative disk disease, Depression, Dysrhythmia, Endometriosis, Family history of chronic pain (06/29/2015), Fibromyalgia, Fibromyalgia, GERD (gastroesophageal reflux disease), Heart murmur, Hiatal hernia, History of attempted suicide (06/29/2015), History of hiatal hernia (06/29/2015), History of psychiatric disorder (06/29/2015), History of suicidal ideation (06/29/2015), Hyperlipidemia, Insomnia, Insomnia, Irritable bowel syndrome, 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 Surgical: Ms. No  has a past surgical history that includes Abdominal hysterectomy (1986); Tonsillectomy (1957); Skin lesion excision; Oophorectomy (1986); ORIF ankle fracture (Left, 03/14/2018); ORIF ankle fracture (Left, 07/26/2018); Ankle surgery; Colonoscopy with propofol (N/A, 06/05/2019); Breast biopsy (Right, 2016); Fracture surgery; and Colonoscopy with propofol (N/A, 03/05/2020). Family: family history includes Breast cancer in her maternal grandmother; Heart disease in her father; Hypertension in her father and mother.  Laboratory Chemistry Profile   Renal Lab Results  Component Value Date   BUN 21 04/26/2019   CREATININE 0.84 04/26/2019   GFRAA >60 04/26/2019   GFRNONAA >60 04/26/2019    Hepatic Lab Results  Component Value Date   AST 35 04/26/2019   ALT 22 04/26/2019   ALBUMIN 4.1 04/26/2019   ALKPHOS 71 04/26/2019    Electrolytes Lab Results  Component Value Date   NA 135 04/26/2019   K 4.5 04/26/2019   CL 103 04/26/2019   CALCIUM  9.4 04/26/2019   MG 1.8 04/26/2019    Bone Lab Results  Component Value Date   VD25OH 40.8 04/26/2019   25OHVITD1 49 03/09/2016   25OHVITD2 <1.0 03/09/2016   25OHVITD3 48 03/09/2016    Inflammation (CRP: Acute Phase) (ESR: Chronic Phase) Lab Results  Component Value Date   CRP 2.2 (H) 04/26/2019   ESRSEDRATE 39 (H) 04/26/2019         Note: Above Lab results reviewed.  Recent Imaging Review  MM 3D SCREENING MAMMOGRAM BILATERAL BREAST CLINICAL DATA:  Screening.  EXAM: DIGITAL SCREENING BILATERAL MAMMOGRAM WITH TOMOSYNTHESIS AND CAD  TECHNIQUE: Bilateral screening digital craniocaudal and mediolateral oblique mammograms were obtained. Bilateral screening digital breast tomosynthesis was performed. The images were evaluated with computer-aided detection.  COMPARISON:  Previous exam(s).  ACR Breast Density Category c: The breasts are heterogeneously dense, which may obscure small masses.  FINDINGS: In the left breast, a possible asymmetry warrants further evaluation. In the right breast, no findings suspicious for malignancy.  IMPRESSION: Further evaluation is suggested for possible asymmetry in the left breast.  RECOMMENDATION: Diagnostic mammogram and possibly ultrasound of the left breast. (Code:FI-L-91M)  The patient will be contacted regarding the findings, and additional imaging will be scheduled.  BI-RADS CATEGORY  0: Incomplete: Need additional imaging evaluation.  Electronically Signed   By: Frederico Hamman M.D.   On: 08/07/2023 12:44 Note: Reviewed        Physical Exam  General appearance: Well nourished, well developed, and well hydrated. In no apparent acute distress Mental status: Alert, oriented x 3 (person, place, & time)       Respiratory: No evidence of acute respiratory distress Eyes: PERLA Vitals: There were no vitals taken for this visit. BMI: Estimated body mass index is 30.11 kg/m as calculated from the following:   Height as of  08/16/23: 5\' 3"  (1.6 m).   Weight as of 08/16/23: 170 lb (77.1 kg). Ideal: Patient weight not recorded  Assessment   Diagnosis Status  No diagnosis found. Controlled Controlled Controlled   Updated Problems: No problems updated.  Plan of Care  Problem-specific:  Assessment and Plan            Zoe Sanchez has a current medication list which includes the following long-term medication(s): albuterol, amitriptyline, dicyclomine, estradiol, fluticasone, hydrocodone-acetaminophen, hydrocodone-acetaminophen, hydrocodone-acetaminophen, metformin, metoprolol tartrate, montelukast, omeprazole, pregabalin, pregabalin, propranolol er, rosuvastatin, and salmeterol.  Pharmacotherapy (Medications Ordered): No orders of the defined types were placed in this encounter.  Orders:  No orders of the defined types were placed in this encounter.  Follow-up plan:   No follow-ups on file.      Interventional Therapies  Risk Factors  Considerations  Medical Comorbidities:  Depressive disorder  GAD  PSVT  CAD  COPD  Dysrhythmias  GERD  Insomnia     Planned  Pending:      Under consideration:   Diagnostic bilateral lumbar facet MBB #3 (w/o steroids)  Therapeutic bilateral lumbar facet MB RFA #1    Completed:   Diagnostic bilateral lumbar facet MBB x2 (w/o steroids) (04/20/2017) (100/60/60/<25)    Therapeutic  Palliative (PRN) options:   None established   Completed by other providers:   None reported   Pharmacotherapy  Nonopioids transferred 08/24/2020: Magnesium, Flexeril, Lyrica, melatonin, vitamin B12, and vitamin D3.       Recent Visits Date Type Provider Dept  08/16/23 Office Visit Delano Metz, MD [facility]  Showing recent visits within past 90 days and meeting all other requirements Future Appointments Date Type Provider Dept  11/15/23 Appointment Delano Metz, MD [facility]  Showing future appointments within next 90  days and meeting all other requirements  I discussed the assessment and treatment plan with the patient. The patient was provided an opportunity to ask questions and all were answered. The patient agreed with the plan and demonstrated an understanding of the instructions.  Patient advised to call back or seek an in-person evaluation if the symptoms or condition worsens.  Duration of encounter: *** minutes.  Total time on encounter, as per AMA guidelines included both the face-to-face and non-face-to-face time personally spent by the physician and/or other qualified health care professional(s) on the day of the encounter (includes time in activities that require the physician or other qualified health care professional and does not include time in activities normally performed by clinical staff). Physician's time may include the following activities when performed: Preparing to see the patient (e.g., pre-charting review of records, searching for previously ordered imaging, lab work, and nerve conduction tests) Review of prior analgesic pharmacotherapies. Reviewing PMP Interpreting ordered tests (e.g., lab work, imaging, nerve conduction tests) Performing post-procedure evaluations, including interpretation of diagnostic procedures Obtaining and/or reviewing separately obtained history Performing a medically appropriate examination and/or evaluation Counseling and educating the patient/family/caregiver Ordering medications, tests, or procedures Referring and communicating with other health care professionals (when not separately reported) Documenting clinical information in the electronic or other  health record Independently interpreting results (not separately reported) and communicating results to the patient/ family/caregiver Care coordination (not separately reported)  Note by: Oswaldo Done, MD Date: 11/15/2023; Time: 8:53 AM

## 2023-11-15 ENCOUNTER — Encounter: Payer: Self-pay | Admitting: Pain Medicine

## 2023-11-15 ENCOUNTER — Ambulatory Visit: Payer: Medicare (Managed Care) | Attending: Pain Medicine | Admitting: Pain Medicine

## 2023-11-15 VITALS — BP 140/63 | HR 79 | Temp 97.1°F | Ht 63.0 in | Wt 170.0 lb

## 2023-11-15 DIAGNOSIS — M79601 Pain in right arm: Secondary | ICD-10-CM | POA: Insufficient documentation

## 2023-11-15 DIAGNOSIS — G8929 Other chronic pain: Secondary | ICD-10-CM | POA: Diagnosis present

## 2023-11-15 DIAGNOSIS — M5459 Other low back pain: Secondary | ICD-10-CM | POA: Diagnosis present

## 2023-11-15 DIAGNOSIS — M545 Low back pain, unspecified: Secondary | ICD-10-CM | POA: Diagnosis present

## 2023-11-15 DIAGNOSIS — M79602 Pain in left arm: Secondary | ICD-10-CM | POA: Insufficient documentation

## 2023-11-15 DIAGNOSIS — M79605 Pain in left leg: Secondary | ICD-10-CM | POA: Insufficient documentation

## 2023-11-15 DIAGNOSIS — G894 Chronic pain syndrome: Secondary | ICD-10-CM | POA: Insufficient documentation

## 2023-11-15 DIAGNOSIS — Z79899 Other long term (current) drug therapy: Secondary | ICD-10-CM | POA: Diagnosis present

## 2023-11-15 DIAGNOSIS — M25552 Pain in left hip: Secondary | ICD-10-CM | POA: Diagnosis present

## 2023-11-15 DIAGNOSIS — M79604 Pain in right leg: Secondary | ICD-10-CM | POA: Insufficient documentation

## 2023-11-15 DIAGNOSIS — M25551 Pain in right hip: Secondary | ICD-10-CM | POA: Diagnosis present

## 2023-11-15 DIAGNOSIS — Z79891 Long term (current) use of opiate analgesic: Secondary | ICD-10-CM | POA: Insufficient documentation

## 2023-11-15 DIAGNOSIS — M47812 Spondylosis without myelopathy or radiculopathy, cervical region: Secondary | ICD-10-CM | POA: Insufficient documentation

## 2023-11-15 DIAGNOSIS — M542 Cervicalgia: Secondary | ICD-10-CM | POA: Diagnosis present

## 2023-11-15 MED ORDER — HYDROCODONE-ACETAMINOPHEN 5-325 MG PO TABS: 1.0000 | ORAL_TABLET | ORAL | 0 refills | Status: DC | PRN

## 2023-11-15 NOTE — Progress Notes (Signed)
 Nursing Pain Medication Assessment:  Safety precautions to be maintained throughout the outpatient stay will include: orient to surroundings, keep bed in low position, maintain call bell within reach at all times, provide assistance with transfer out of bed and ambulation.  Medication Inspection Compliance: Pill count conducted under aseptic conditions, in front of the patient. Neither the pills nor the bottle was removed from the patient's sight at any time. Once count was completed pills were immediately returned to the patient in their original bottle.  Medication: Hydrocodone/APAP Pill/Patch Count:  175 of 180 pills remain Pill/Patch Appearance: Markings consistent with prescribed medication Bottle Appearance: Standard pharmacy container. Clearly labeled. Filled Date: 2 / 24 / 2025 Last Medication intake:  TodaySafety precautions to be maintained throughout the outpatient stay will include: orient to surroundings, keep bed in low position, maintain call bell within reach at all times, provide assistance with transfer out of bed and ambulation.

## 2023-12-05 ENCOUNTER — Ambulatory Visit: Payer: 59 | Admitting: Dermatology

## 2023-12-19 ENCOUNTER — Telehealth: Payer: Self-pay | Admitting: Pain Medicine

## 2023-12-19 NOTE — Telephone Encounter (Signed)
 Zoe Sanchez at [facility] is calling about Hydrocodone. Script. She had her March refill on 3-24. Then switched her insurance. The pharmacy then refilled her script of Hydrocone for April by mistake and sent it to her. It was not fault of the patient. Pharmacy did not get flagged because of the new insurance.  The pharmacy needs to know if physician wants them to go collect the medication or leave it with the patient with the understanding it must last until her refill date in May?

## 2023-12-19 NOTE — Telephone Encounter (Signed)
 Received a call from pharmacy that they gave the patient prescription too soon due to insurance change.  She filled on 3-43 and again on 4-7.  I tried to call the patient to inform her that her medications should last until 5-23 and she should not take any more than normally prescribed.  He fill dates should have been 3-24, 4-23 and 5-23.  Unable to leave patient message because her memory was  full on her voicemail.

## 2024-01-11 ENCOUNTER — Other Ambulatory Visit: Payer: Self-pay | Admitting: Dermatology

## 2024-01-25 ENCOUNTER — Ambulatory Visit: Admitting: Dermatology

## 2024-02-14 ENCOUNTER — Encounter: Payer: Medicare (Managed Care) | Admitting: Pain Medicine

## 2024-02-14 ENCOUNTER — Encounter: Payer: Medicare (Managed Care) | Admitting: Nurse Practitioner

## 2024-02-28 ENCOUNTER — Ambulatory Visit: Payer: Self-pay | Attending: Nurse Practitioner | Admitting: Nurse Practitioner

## 2024-02-28 ENCOUNTER — Encounter: Payer: Self-pay | Admitting: Nurse Practitioner

## 2024-02-28 DIAGNOSIS — M25551 Pain in right hip: Secondary | ICD-10-CM | POA: Insufficient documentation

## 2024-02-28 DIAGNOSIS — M5459 Other low back pain: Secondary | ICD-10-CM | POA: Diagnosis present

## 2024-02-28 DIAGNOSIS — M47812 Spondylosis without myelopathy or radiculopathy, cervical region: Secondary | ICD-10-CM | POA: Insufficient documentation

## 2024-02-28 DIAGNOSIS — M79605 Pain in left leg: Secondary | ICD-10-CM | POA: Insufficient documentation

## 2024-02-28 DIAGNOSIS — Z79891 Long term (current) use of opiate analgesic: Secondary | ICD-10-CM | POA: Diagnosis present

## 2024-02-28 DIAGNOSIS — M545 Low back pain, unspecified: Secondary | ICD-10-CM | POA: Insufficient documentation

## 2024-02-28 DIAGNOSIS — M542 Cervicalgia: Secondary | ICD-10-CM | POA: Diagnosis present

## 2024-02-28 DIAGNOSIS — Z79899 Other long term (current) drug therapy: Secondary | ICD-10-CM | POA: Insufficient documentation

## 2024-02-28 DIAGNOSIS — M79602 Pain in left arm: Secondary | ICD-10-CM | POA: Diagnosis present

## 2024-02-28 DIAGNOSIS — M79601 Pain in right arm: Secondary | ICD-10-CM | POA: Insufficient documentation

## 2024-02-28 DIAGNOSIS — M79604 Pain in right leg: Secondary | ICD-10-CM | POA: Insufficient documentation

## 2024-02-28 DIAGNOSIS — G894 Chronic pain syndrome: Secondary | ICD-10-CM | POA: Diagnosis present

## 2024-02-28 DIAGNOSIS — M25552 Pain in left hip: Secondary | ICD-10-CM | POA: Diagnosis present

## 2024-02-28 DIAGNOSIS — G8929 Other chronic pain: Secondary | ICD-10-CM | POA: Diagnosis present

## 2024-02-28 MED ORDER — HYDROCODONE-ACETAMINOPHEN 5-325 MG PO TABS: 1.0000 | ORAL_TABLET | ORAL | 0 refills | Status: DC | PRN

## 2024-02-28 NOTE — Progress Notes (Signed)
 PROVIDER NOTE: Interpretation of information contained herein should be left to medically-trained personnel. Specific patient instructions are provided elsewhere under Patient Instructions section of medical record. This document was created in part using AI and STT-dictation technology, any transcriptional errors that may result from this process are unintentional.  Patient: Zoe Sanchez  Service: E/M   PCP: Comer Decamp, MD  DOB: [birth year]  DOS: 02/28/2024  Provider: Cherylin Corrigan, NP  MRN: [id number]  Delivery: Face-to-face  Specialty: Interventional Pain Management  Type: Established Patient  Setting: Ambulatory outpatient facility  Specialty designation: 09  Referring Prov.: Comer Decamp, MD  Location: Outpatient office facility       History of present illness (HPI) Zoe Sanchez, a 68 y.o. year old female, is here today because of her No primary diagnosis found.. Zoe Sanchez's primary complain today is Back Pain (Lower back and neck)  Pertinent problems: Zoe Sanchez has Myofascial pain syndrome, Cervical facet syndrome (Bilateral) (R>L); Chronic pain syndrome; Fibromyalgia; Cervical spondylosis; lumbar facet arthropathy and cervical facet arthropathy on their pertinent problem list.  Pain Assessment: Severity of Chronic pain is reported as a 4 /10. Location: Back Lower/denies. Onset: More than a month ago. Quality: Aching. Timing: Constant. Modifying factor(s): meds. Vitals:  height is 5' 3 (1.6 m) and weight is 170 lb (77.1 kg). Her temperature is 97 F (36.1 C) (abnormal). Her blood pressure is 94/49 (abnormal) and her pulse is 80. Her oxygen saturation is 98%.  BMI: Estimated body mass index is 30.11 kg/m as calculated from the following:   Height as of this encounter: 5' 3 (1.6 m).   Weight as of this encounter: 170 lb (77.1 kg).  Last encounter: 11/15/2023 Last procedure: Visit date not found.  Reason for encounter: medication management.  The patient indicates doing  well with current medication regimen.  No side effects or adverse reaction reported to medication.   The patient reports significant worsening of her back pain, to the extent that she cannot stand upright for long.  She needs to sit every 5 to 10 minutes.  Due to the severity of her symptoms, she is considering the use of walker.  In an effort to strengthen her back she has been increasing her physical activity by walking more.  She also describes intermittent right foot weakness, occasionally leading to near falls.  Per patient report, she lost her balance after becoming tangled in her pants while attempting to provide a urine sample.  She sat down on the floor and accidentally urinated on the floor . No injuries were sustained and she did not hit her head.  A urine sample will be obtained at her next visit. Pharmacotherapy Assessment   Analgesic: Hydrocodone -acetaminophen  (Norco/Vicodin) 5-325 mg tablet every 4 hours as needed for pain. MME=30 Monitoring: [place] PMP: PDMP reviewed during this encounter.       Pharmacotherapy: No side-effects or adverse reactions reported. Compliance: No problems identified. Effectiveness: Clinically acceptable.  Merilyn Staple, RN  02/28/2024  2:42 PM  Sign when Signing Visit Safety precautions to be maintained throughout the outpatient stay will include: orient to surroundings, keep bed in low position, maintain call bell within reach at all times, provide assistance with transfer out of bed and ambulation.   Nursing Pain Medication Assessment:  Safety precautions to be maintained throughout the outpatient stay will include: orient to surroundings, keep bed in low position, maintain call bell within reach at all times, provide assistance with transfer out of bed and ambulation.  Medication Inspection Compliance: Pill count conducted under aseptic conditions, in front of the patient. Neither the pills nor the bottle was removed from the patient's sight at any time.  Once count was completed pills were immediately returned to the patient in their original bottle.  Medication: Hydrocodone /APAP Pill/Patch Count: 85 of 180 pills/patches remain Pill/Patch Appearance: Markings consistent with prescribed medication Bottle Appearance: Standard pharmacy container. Clearly labeled. Filled Date: 5/ 67 / 2025 per pt; pt states this is an old Rx bottle; she threw the new bottle away Last Medication intake:  Today    UDS:  Summary  Date Value Ref Range Status  02/15/2023 Note  Final    Comment:    ==================================================================== [facility] (MW) ==================================================================== Test                             Result       Flag       Units  Drug Present and Declared for Prescription Verification   Hydrocodone                     5627         EXPECTED   ng/mg creat   Hydromorphone                   234          EXPECTED   ng/mg creat   Dihydrocodeine                 398          EXPECTED   ng/mg creat   Norhydrocodone                 1403         EXPECTED   ng/mg creat    Sources of hydrocodone  include scheduled prescription medications.    Hydromorphone , dihydrocodeine and norhydrocodone are expected    metabolites of hydrocodone . Hydromorphone  and dihydrocodeine are    also available as scheduled prescription medications.  ==================================================================== Test                      Result    Flag   Units      Ref Range   Creatinine              190              mg/dL      >=14 ==================================================================== Declared Medications:  The flagging and interpretation on this report are based on the  following declared medications.  Unexpected results may arise from  inaccuracies in the declared medications.   **Note: The testing scope of this panel includes these medications:   Hydrocodone  (Norco)    **Note: The testing scope of this panel does not include the  following reported medications:   Acetaminophen  (Norco)  Albuterol (Ventolin HFA)  Amitriptyline (Elavil)  Betamethasone  Cyclobenzaprine  (Flexeril )  Dicyclomine (Bentyl)  Docusate (Colace)  Estradiol (Estrace)  Fluticasone (Flonase)  Gemfibrozil (Lopid)  Magnesium   Metformin (Glucophage)  Metoprolol (Lopressor)  Metronidazole  (Flagyl )  Montelukast (Singulair)  Naloxone  (Narcan )  Omeprazole (Prilosec)  Potassium (Klor-Con)  Pregabalin  (Lyrica )  Propranolol (Inderal)  Rosuvastatin (Crestor)  Salmeterol  Sennosides (Senokot)  Umeclidinium (Incruse Ellipta)  Zolpidem (Ambien) ==================================================================== For clinical consultation, please call [phone]. ====================================================================     No results found for: CBDTHCR No results found for: D8THCCBX No results found for: D9THCCBX  ROS  Constitutional: Denies any fever or chills Gastrointestinal: No reported hemesis, hematochezia, vomiting, or acute GI distress Musculoskeletal: Lower back pain, neck pain Neurological: No reported episodes of acute onset apraxia, aphasia, dysarthria, agnosia, amnesia, paralysis, loss of coordination, or loss of consciousness  Medication Review  HYDROcodone -acetaminophen , Magnesium  Gluconate, albuterol, amitriptyline, betamethasone dipropionate, cyclobenzaprine , dicyclomine, docusate sodium , estradiol, fluticasone, gemfibrozil, metFORMIN, metoprolol tartrate, metroNIDAZOLE , montelukast, omeprazole, potassium chloride, pregabalin , propranolol ER, rosuvastatin, salmeterol, senna, umeclidinium bromide, and zolpidem  History Review  Allergy: Zoe Sanchez is allergic to prednisone. Drug: Zoe Sanchez  reports no history of drug use. Alcohol:  reports no history of alcohol use. Tobacco:  reports that she has never smoked. She has never used smokeless  tobacco. Social: Zoe Sanchez  reports that she has never smoked. She has never used smokeless tobacco. She reports that she does not drink alcohol and does not use drugs. Medical:  has a past medical history of Abnormal heart rhythm, Abnormal mammogram (02/25/2014), Actinic keratosis, Anemia, Anxiety, Asthma, Awareness of heartbeats (06/29/2015), Basal cell carcinoma (04/24/2008), Basal cell carcinoma (04/09/2009), Basal cell carcinoma (11/02/2022), Basal cell carcinoma (BCC) (12/23/2014), Breath shortness (06/29/2015), CAD (coronary artery disease), Cancer (HCC), Cervical radicular pain (Location of Secondary source of pain) (Bilateral) (R>L) (C8 Dermatome) (06/29/2015), Chronic pain, COPD (chronic obstructive pulmonary disease) (HCC), DDD (degenerative disc disease), cervical, Degenerative disk disease, Depression, Dysrhythmia, Endometriosis, Family history of chronic pain (06/29/2015), Fibromyalgia, Fibromyalgia, GERD (gastroesophageal reflux disease), Heart murmur, Hiatal hernia, History of attempted suicide (06/29/2015), History of hiatal hernia (06/29/2015), History of psychiatric disorder (06/29/2015), History of suicidal ideation (06/29/2015), Hyperlipidemia, Insomnia, Insomnia, Irritable bowel syndrome, 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 Surgical: Zoe Sanchez  has a past surgical history that includes Abdominal hysterectomy (1986); Tonsillectomy (1957); Skin lesion excision; Oophorectomy (1986); ORIF ankle fracture (Left, 03/14/2018); ORIF ankle fracture (Left, 07/26/2018); Ankle surgery; Colonoscopy with propofol  (N/A, 06/05/2019); Breast biopsy (Right, 2016); Fracture surgery; and Colonoscopy with propofol  (N/A, 03/05/2020). Family: family history includes Breast cancer in her maternal grandmother; Heart disease in her father; Hypertension in her  father and mother.  Laboratory Chemistry Profile   Renal Lab Results  Component Value Date   BUN 21 04/26/2019   CREATININE 0.84 04/26/2019   GFRAA >60 04/26/2019   GFRNONAA >60 04/26/2019    Hepatic Lab Results  Component Value Date   AST 35 04/26/2019   ALT 22 04/26/2019   ALBUMIN 4.1 04/26/2019   ALKPHOS 71 04/26/2019    Electrolytes Lab Results  Component Value Date   NA 135 04/26/2019   K 4.5 04/26/2019   CL 103 04/26/2019   CALCIUM 9.4 04/26/2019   MG 1.8 04/26/2019    Bone Lab Results  Component Value Date   VD25OH 40.8 04/26/2019   25OHVITD1 49 03/09/2016   25OHVITD2 <1.0 03/09/2016   25OHVITD3 48 03/09/2016    Inflammation (CRP: Acute Phase) (ESR: Chronic Phase) Lab Results  Component Value Date   CRP 2.2 (H) 04/26/2019   ESRSEDRATE 39 (H) 04/26/2019         Note: Above Lab results reviewed.  Recent Imaging Review  MM 3D SCREENING MAMMOGRAM BILATERAL BREAST CLINICAL DATA:  Screening.  EXAM: DIGITAL SCREENING BILATERAL MAMMOGRAM WITH TOMOSYNTHESIS AND CAD  TECHNIQUE: Bilateral screening digital craniocaudal and mediolateral oblique mammograms were obtained. Bilateral screening digital breast tomosynthesis was performed. The images were evaluated with computer-aided detection.  COMPARISON:  Previous exam(s).  ACR Breast  Density Category c: The breasts are heterogeneously dense, which may obscure small masses.  FINDINGS: In the left breast, a possible asymmetry warrants further evaluation. In the right breast, no findings suspicious for malignancy.  IMPRESSION: Further evaluation is suggested for possible asymmetry in the left breast.  RECOMMENDATION: Diagnostic mammogram and possibly ultrasound of the left breast. (Code:FI-L-11M)  The patient will be contacted regarding the findings, and additional imaging will be scheduled.  BI-RADS CATEGORY  0: Incomplete: Need additional imaging evaluation.  Electronically Signed   By:  Alinda Apley M.D.   On: 08/07/2023 12:44 Note: Reviewed        Physical Exam  General appearance: Well nourished, well developed, and well hydrated. In no apparent acute distress Mental status: Alert, oriented x 3 (person, place, & time)       Respiratory: No evidence of acute respiratory distress Eyes: PERLA Vitals: BP (!) 94/49   Pulse 80   Temp (!) 97 F (36.1 C)   Ht 5' 3 (1.6 m)   Wt 170 lb (77.1 kg)   SpO2 98%   BMI 30.11 kg/m  BMI: Estimated body mass index is 30.11 kg/m as calculated from the following:   Height as of this encounter: 5' 3 (1.6 m).   Weight as of this encounter: 170 lb (77.1 kg). Ideal: Ideal body weight: 52.4 kg (115 lb 8.3 oz) Adjusted ideal body weight: 62.3 kg (137 lb 5 oz)  Assessment   Diagnosis Status  1. Chronic bilateral low back pain without sciatica   2. Chronic pain of lower extremity, bilateral   3. Chronic hip pain, bilateral   4. Chronic neck pain   5. Chronic pain of both upper extremities   6. Cervical facet syndrome   7. Lumbar facet joint pain   8. Chronic pain syndrome   9. Pharmacologic therapy   10. Chronic use of opiate for therapeutic purpose   11. Encounter for medication management   12. Encounter for chronic pain management    Controlled Controlled Controlled   Updated Problems: No problems updated.  Plan of Care  Problem-specific:  Assessment and Plan We will continue on current medication regimen.  Prescribing drug monitoring (PDMP) reviewed; findings consistent with the use of prescribed medication and no evidence of narcotic misuse or abuse.  A urine sample will be obtained at her next visit.  Schedule follow-up in 90 days for medication management.  Zoe Sanchez has a current medication list which includes the following long-term medication(s): albuterol, amitriptyline, dicyclomine, estradiol, fluticasone, [START ON 03/03/2024] hydrocodone -acetaminophen , [START ON 04/02/2024]  hydrocodone -acetaminophen , [START ON 05/02/2024] hydrocodone -acetaminophen , metformin, metoprolol tartrate, montelukast, omeprazole, pregabalin , propranolol er, rosuvastatin, and salmeterol.  Pharmacotherapy (Medications Ordered): Meds ordered this encounter  Medications   HYDROcodone -acetaminophen  (NORCO/VICODIN) 5-325 MG tablet    Sig: Take 1 tablet by mouth every 4 (four) hours as needed for severe pain (pain score 7-10). Must last 30 days    Dispense:  180 tablet    Refill:  0    DO NOT: delete (not duplicate); no partial-fill (will deny script to complete), no refill request (F/U required). DISPENSE: 1 day early if closed on fill date. WARN: No CNS-depressants within 8 hrs of med.   HYDROcodone -acetaminophen  (NORCO/VICODIN) 5-325 MG tablet    Sig: Take 1 tablet by mouth every 4 (four) hours as needed for severe pain (pain score 7-10). Must last 30 days    Dispense:  180 tablet    Refill:  0    DO NOT:  delete (not duplicate); no partial-fill (will deny script to complete), no refill request (F/U required). DISPENSE: 1 day early if closed on fill date. WARN: No CNS-depressants within 8 hrs of med.   HYDROcodone -acetaminophen  (NORCO/VICODIN) 5-325 MG tablet    Sig: Take 1 tablet by mouth every 4 (four) hours as needed for severe pain (pain score 7-10). Must last 30 days    Dispense:  180 tablet    Refill:  0    DO NOT: delete (not duplicate); no partial-fill (will deny script to complete), no refill request (F/U required). DISPENSE: 1 day early if closed on fill date. WARN: No CNS-depressants within 8 hrs of med.   Orders:  No orders of the defined types were placed in this encounter.       Return in about 3 months (around 05/30/2024) for (F2F), (MM), Marthe Slain NP.    Recent Visits No visits were found meeting these conditions. Showing recent visits within past 90 days and meeting all other requirements Today's Visits Date Type Provider Dept  02/28/24 Office Visit Yanina Knupp K,  NP [facility]  Showing today's visits and meeting all other requirements Future Appointments Date Type Provider Dept  05/22/24 Appointment Nyrie Sigal K, NP [facility]  Showing future appointments within next 90 days and meeting all other requirements  I discussed the assessment and treatment plan with the patient. The patient was provided an opportunity to ask questions and all were answered. The patient agreed with the plan and demonstrated an understanding of the instructions.  Patient advised to call back or seek an in-person evaluation if the symptoms or condition worsens.  Duration of encounter: 30 minutes.  Total time on encounter, as per AMA guidelines included both the face-to-face and non-face-to-face time personally spent by the physician and/or other qualified health care professional(s) on the day of the encounter (includes time in activities that require the physician or other qualified health care professional and does not include time in activities normally performed by clinical staff). Physician's time may include the following activities when performed: Preparing to see the patient (e.g., pre-charting review of records, searching for previously ordered imaging, lab work, and nerve conduction tests) Review of prior analgesic pharmacotherapies. Reviewing PMP Interpreting ordered tests (e.g., lab work, imaging, nerve conduction tests) Performing post-procedure evaluations, including interpretation of diagnostic procedures Obtaining and/or reviewing separately obtained history Performing a medically appropriate examination and/or evaluation Counseling and educating the patient/family/caregiver Ordering medications, tests, or procedures Referring and communicating with other health care professionals (when not separately reported) Documenting clinical information in the electronic or other health record Independently interpreting results (not separately  reported) and communicating results to the patient/ family/caregiver Care coordination (not separately reported)  Note by: Roseann Kees K Promiss Labarbera, NP (TTS and AI technology used. I apologize for any typographical errors that were not detected and corrected.) Date: 02/28/2024; Time: 3:34 PM

## 2024-02-28 NOTE — Progress Notes (Signed)
 Safety precautions to be maintained throughout the outpatient stay will include: orient to surroundings, keep bed in low position, maintain call bell within reach at all times, provide assistance with transfer out of bed and ambulation.   Nursing Pain Medication Assessment:  Safety precautions to be maintained throughout the outpatient stay will include: orient to surroundings, keep bed in low position, maintain call bell within reach at all times, provide assistance with transfer out of bed and ambulation.  Medication Inspection Compliance: Pill count conducted under aseptic conditions, in front of the patient. Neither the pills nor the bottle was removed from the patient's sight at any time. Once count was completed pills were immediately returned to the patient in their original bottle.  Medication: Hydrocodone /APAP Pill/Patch Count: 85 of 180 pills/patches remain Pill/Patch Appearance: Markings consistent with prescribed medication Bottle Appearance: Standard pharmacy container. Clearly labeled. Filled Date: 5/ 50 / 2025 per pt; pt states this is an old Rx bottle; she threw the new bottle away Last Medication intake:  Today

## 2024-03-25 ENCOUNTER — Ambulatory Visit: Admitting: Dermatology

## 2024-05-22 ENCOUNTER — Ambulatory Visit (HOSPITAL_BASED_OUTPATIENT_CLINIC_OR_DEPARTMENT_OTHER): Admitting: Nurse Practitioner

## 2024-05-22 DIAGNOSIS — G894 Chronic pain syndrome: Secondary | ICD-10-CM

## 2024-05-22 DIAGNOSIS — Z91199 Patient's noncompliance with other medical treatment and regimen due to unspecified reason: Secondary | ICD-10-CM

## 2024-05-22 NOTE — Progress Notes (Signed)
  05/22/2024-No show

## 2024-07-09 ENCOUNTER — Ambulatory Visit (HOSPITAL_BASED_OUTPATIENT_CLINIC_OR_DEPARTMENT_OTHER): Admitting: Nurse Practitioner

## 2024-07-09 DIAGNOSIS — Z91199 Patient's noncompliance with other medical treatment and regimen due to unspecified reason: Secondary | ICD-10-CM

## 2024-07-09 DIAGNOSIS — G894 Chronic pain syndrome: Secondary | ICD-10-CM

## 2024-07-09 NOTE — Progress Notes (Signed)
 07/09/2024-No Show

## 2024-07-10 ENCOUNTER — Encounter: Admitting: Nurse Practitioner

## 2024-07-16 ENCOUNTER — Ambulatory Visit: Attending: Nurse Practitioner | Admitting: Nurse Practitioner

## 2024-07-16 ENCOUNTER — Encounter: Payer: Self-pay | Admitting: Nurse Practitioner

## 2024-07-16 VITALS — BP 118/52 | HR 76 | Temp 97.0°F | Resp 16 | Ht 63.0 in | Wt 170.0 lb

## 2024-07-16 DIAGNOSIS — M25552 Pain in left hip: Secondary | ICD-10-CM | POA: Insufficient documentation

## 2024-07-16 DIAGNOSIS — M79605 Pain in left leg: Secondary | ICD-10-CM | POA: Diagnosis not present

## 2024-07-16 DIAGNOSIS — M25551 Pain in right hip: Secondary | ICD-10-CM | POA: Diagnosis not present

## 2024-07-16 DIAGNOSIS — Z79891 Long term (current) use of opiate analgesic: Secondary | ICD-10-CM | POA: Insufficient documentation

## 2024-07-16 DIAGNOSIS — M542 Cervicalgia: Secondary | ICD-10-CM | POA: Insufficient documentation

## 2024-07-16 DIAGNOSIS — G894 Chronic pain syndrome: Secondary | ICD-10-CM | POA: Diagnosis present

## 2024-07-16 DIAGNOSIS — M47812 Spondylosis without myelopathy or radiculopathy, cervical region: Secondary | ICD-10-CM | POA: Insufficient documentation

## 2024-07-16 DIAGNOSIS — M545 Low back pain, unspecified: Secondary | ICD-10-CM | POA: Diagnosis present

## 2024-07-16 DIAGNOSIS — M5459 Other low back pain: Secondary | ICD-10-CM | POA: Insufficient documentation

## 2024-07-16 DIAGNOSIS — G8929 Other chronic pain: Secondary | ICD-10-CM | POA: Insufficient documentation

## 2024-07-16 DIAGNOSIS — M79604 Pain in right leg: Secondary | ICD-10-CM | POA: Diagnosis present

## 2024-07-16 MED ORDER — HYDROCODONE-ACETAMINOPHEN 5-325 MG PO TABS: 1.0000 | ORAL_TABLET | ORAL | 0 refills | Status: AC | PRN

## 2024-07-16 MED ORDER — NALOXONE HCL 4 MG/0.1ML NA LIQD: 1.0000 | NASAL | 1 refills | Status: AC | PRN

## 2024-07-16 NOTE — Addendum Note (Signed)
 Addended by: TOBIE RICHMOND on: 07/16/2024 03:13 PM   Modules accepted: Orders

## 2024-07-16 NOTE — Progress Notes (Signed)
 Nursing Pain Medication Assessment:  Safety precautions to be maintained throughout the outpatient stay will include: orient to surroundings, keep bed in low position, maintain call bell within reach at all times, provide assistance with transfer out of bed and ambulation.  Medication Inspection Compliance: Ms. Masse did not comply with our request to bring her pills to be counted. She was reminded that bringing the medication bottles, even when empty, is a requirement.  Medication: None brought in. Pill/Patch Count: None available to be counted. Bottle Appearance: No container available. Did not bring bottle(s) to appointment. Filled Date: N/A Last Medication intake:  about 1 week ago  Pt states she has not been able to find hydrocodone  bottle in about 1 week and speculates that maybe her mother who has dementia moved the bottle somewhere that patient cannot find it.

## 2024-07-16 NOTE — Progress Notes (Addendum)
 PROVIDER NOTE: Interpretation of information contained herein should be left to medically-trained personnel. Specific patient instructions are provided elsewhere under Patient Instructions section of medical record. This document was created in part using AI and STT-dictation technology, any transcriptional errors that may result from this process are unintentional.  Patient: Zoe Sanchez  Service: E/M   PCP: Tobie Domino, MD  DOB: 23-Jun-1956  DOS: 07/16/2024  Provider: Emmy MARLA Tobie, NP  MRN: [id number]  Delivery: Face-to-face  Specialty: Interventional Pain Management  Type: Established Patient  Setting: Ambulatory outpatient facility  Specialty designation: 09  Referring Prov.: Tobie Domino, MD  Location: Outpatient office facility       History of present illness (HPI) [name], a 68 y.o. year old female, is here today because of her Chronic bilateral low back pain without sciatica [M54.50, G89.29]. [name] primary complain today is Pain (Head and neck) and low back pain.   Pertinent problems: [name] has Myofascial pain syndrome, Cervical facet syndrome (Bilateral) (R>L); Chronic pain syndrome; Fibromyalgia; Cervical spondylosis; lumbar facet arthropathy and cervical facet arthropathy on their pertinent problem list.  Pain Assessment: Severity of Chronic pain is reported as a 4 /10. Location: Neck Posterior/sometimes to left side of head; sometimes to suprascapular area. Onset: More than a month ago. Quality: Other (Comment) (it just hurts). Timing: Constant. Modifying factor(s): meds. Vitals:  height is 5' 3 (1.6 m) and weight is 170 lb (77.1 kg). Her temperature is 97 F (36.1 C) (abnormal). Her blood pressure is 118/52 (abnormal) and her pulse is 76. Her respiration is 16 and oxygen saturation is 98%.  BMI: Estimated body mass index is 30.11 kg/m as calculated from the following:   Height as of this encounter: 5' 3 (1.6 m).   Weight as of this encounter: 170 lb  (77.1 kg).  Last encounter: 07/09/2024. Last procedure: Visit date not found.  Reason for encounter: medication management.   Discussed the use of AI scribe software for clinical note transcription with the patient, who gave verbal consent to proceed.  History of Present Illness   Zoe Sanchez is a 68 year old female who presents with neck and back pain. The patient indicates doing well with current medication regimen. No side effects or adverse reaction reported to medication.   She misplaced her hydrocodone  prescription and suspects her mother may have taken it, as the pill bottle was left on the table and was gone upon her return. Despite searching the house, she cannot find it. The patient was advised to place a pill bottle in a safe place to protect from children and older person who has dementia. She experiences pain in her neck and back, though she did not specify the onset or duration. This pain is a current issue for her.     Pharmacotherapy Assessment   Analgesic: Hydrocodone -acetaminophen  (Norco/Vicodin) 5-325 mg tablet every 4 hours as needed for pain. MME=30 Monitoring: [place] PMP: PDMP reviewed during this encounter.       Pharmacotherapy: No side-effects or adverse reactions reported. Compliance: No problems identified. Effectiveness: Clinically acceptable.  Zoe Pulling, RN  07/16/2024  2:35 PM  Sign when Signing Visit Nursing Pain Medication Assessment:  Safety precautions to be maintained throughout the outpatient stay will include: orient to surroundings, keep bed in low position, maintain call bell within reach at all times, provide assistance with transfer out of bed and ambulation.  Medication Inspection Compliance: [name] did not comply with our request to bring her pills  to be counted. She was reminded that bringing the medication bottles, even when empty, is a requirement.  Medication: None brought in. Pill/Patch Count: None available to be counted. Bottle  Appearance: No container available. Did not bring bottle(s) to appointment. Filled Date: N/A Last Medication intake:  about 1 week ago  Pt states she has not been able to find hydrocodone  bottle in about 1 week and speculates that maybe her mother who has dementia moved the bottle somewhere that patient cannot find it.    UDS:  Summary  Date Value Ref Range Status  02/15/2023 Note  Final    Comment:    ==================================================================== [employer] [facility] (MW) ==================================================================== Test                             Result       Flag       Units  Drug Present and Declared for Prescription Verification   Hydrocodone                     5627         EXPECTED   ng/mg creat   Hydromorphone                   234          EXPECTED   ng/mg creat   Dihydrocodeine                 398          EXPECTED   ng/mg creat   Norhydrocodone                 1403         EXPECTED   ng/mg creat    Sources of hydrocodone  include scheduled prescription medications.    Hydromorphone , dihydrocodeine and norhydrocodone are expected    metabolites of hydrocodone . Hydromorphone  and dihydrocodeine are    also available as scheduled prescription medications.  ==================================================================== Test                      Result    Flag   Units      Ref Range   Creatinine              190              mg/dL      >=79 ==================================================================== Declared Medications:  The flagging and interpretation on this report are based on the  following declared medications.  Unexpected results may arise from  inaccuracies in the declared medications.   **Note: The testing scope of this panel includes these medications:   Hydrocodone  (Norco)   **Note: The testing scope of this panel does not include the  following reported medications:   Acetaminophen  (Norco)   Albuterol (Ventolin HFA)  Amitriptyline (Elavil)  Betamethasone  Cyclobenzaprine  (Flexeril )  Dicyclomine (Bentyl)  Docusate (Colace)  Estradiol (Estrace)  Fluticasone (Flonase)  Gemfibrozil (Lopid)  Magnesium   Metformin (Glucophage)  Metoprolol (Lopressor)  Metronidazole  (Flagyl )  Montelukast (Singulair)  Naloxone  (Narcan )  Omeprazole (Prilosec)  Potassium (Klor-Con)  Pregabalin  (Lyrica )  Propranolol (Inderal)  Rosuvastatin (Crestor)  Salmeterol  Sennosides (Senokot)  Umeclidinium (Incruse Ellipta)  Zolpidem (Ambien) ==================================================================== For clinical consultation, please call [phone]. ====================================================================     No results found for: CBDTHCR No results found for: D8THCCBX No results found for: D9THCCBX  ROS  Constitutional: Denies any fever or chills Gastrointestinal: No reported  hemesis, hematochezia, vomiting, or acute GI distress Musculoskeletal: Neck pain, low back pain Neurological: No reported episodes of acute onset apraxia, aphasia, dysarthria, agnosia, amnesia, paralysis, loss of coordination, or loss of consciousness  Medication Review  HYDROcodone -acetaminophen , Magnesium  Gluconate, albuterol, amitriptyline, betamethasone dipropionate, cyclobenzaprine , dicyclomine, docusate sodium , estradiol, fluticasone, gemfibrozil, metFORMIN, metoprolol tartrate, metroNIDAZOLE , montelukast, omeprazole, potassium chloride, pregabalin , propranolol ER, rosuvastatin, salmeterol, senna, umeclidinium bromide, and zolpidem  History Review  Allergy: Zoe Sanchez is allergic to prednisone. Drug: Zoe Sanchez  reports no history of drug use. Alcohol:  reports no history of alcohol use. Tobacco:  reports that she has never smoked. She has never used smokeless tobacco. Social: Zoe Sanchez  reports that she has never smoked. She has never used smokeless tobacco. She reports that she  does not drink alcohol and does not use drugs. Medical:  has a past medical history of Abnormal heart rhythm, Abnormal mammogram (02/25/2014), Actinic keratosis, Anemia, Anxiety, Asthma, Awareness of heartbeats (06/29/2015), Basal cell carcinoma (04/24/2008), Basal cell carcinoma (04/09/2009), Basal cell carcinoma (11/02/2022), Basal cell carcinoma (BCC) (12/23/2014), Breath shortness (06/29/2015), CAD (coronary artery disease), Cancer (HCC), Cervical radicular pain (Location of Secondary source of pain) (Bilateral) (R>L) (C8 Dermatome) (06/29/2015), Chronic pain, COPD (chronic obstructive pulmonary disease) (HCC), DDD (degenerative disc disease), cervical, Degenerative disk disease, Depression, Dysrhythmia, Endometriosis, Family history of chronic pain (06/29/2015), Fibromyalgia, Fibromyalgia, GERD (gastroesophageal reflux disease), Heart murmur, Hiatal hernia, History of attempted suicide (06/29/2015), History of hiatal hernia (06/29/2015), History of psychiatric disorder (06/29/2015), History of suicidal ideation (06/29/2015), Hyperlipidemia, Insomnia, Insomnia, Irritable bowel syndrome, 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 Surgical: Zoe Sanchez  has a past surgical history that includes Abdominal hysterectomy (1986); Tonsillectomy (1957); Skin lesion excision; Oophorectomy (1986); ORIF ankle fracture (Left, 03/14/2018); ORIF ankle fracture (Left, 07/26/2018); Ankle surgery; Colonoscopy with propofol  (N/A, 06/05/2019); Breast biopsy (Right, 2016); Fracture surgery; and Colonoscopy with propofol  (N/A, 03/05/2020). Family: family history includes Breast cancer in her maternal grandmother; Heart disease in her father; Hypertension in her father and mother.  Laboratory Chemistry Profile   Renal Lab Results  Component Value Date   BUN 21 04/26/2019    CREATININE 0.84 04/26/2019   GFRAA >60 04/26/2019   GFRNONAA >60 04/26/2019    Hepatic Lab Results  Component Value Date   AST 35 04/26/2019   ALT 22 04/26/2019   ALBUMIN 4.1 04/26/2019   ALKPHOS 71 04/26/2019    Electrolytes Lab Results  Component Value Date   NA 135 04/26/2019   K 4.5 04/26/2019   CL 103 04/26/2019   CALCIUM 9.4 04/26/2019   MG 1.8 04/26/2019    Bone Lab Results  Component Value Date   VD25OH 40.8 04/26/2019   25OHVITD1 49 03/09/2016   25OHVITD2 <1.0 03/09/2016   25OHVITD3 48 03/09/2016    Inflammation (CRP: Acute Phase) (ESR: Chronic Phase) Lab Results  Component Value Date   CRP 2.2 (H) 04/26/2019   ESRSEDRATE 39 (H) 04/26/2019         Note: Above Lab results reviewed.  Recent Imaging Review  MM 3D SCREENING MAMMOGRAM BILATERAL BREAST CLINICAL DATA:  Screening.  EXAM: DIGITAL SCREENING BILATERAL MAMMOGRAM WITH TOMOSYNTHESIS AND CAD  TECHNIQUE: Bilateral screening digital craniocaudal and mediolateral oblique mammograms were obtained. Bilateral screening digital breast tomosynthesis was performed. The images were evaluated with computer-aided detection.  COMPARISON:  Previous exam(s).  ACR Breast Density Category c: The breasts are heterogeneously dense, which  may obscure small masses.  FINDINGS: In the left breast, a possible asymmetry warrants further evaluation. In the right breast, no findings suspicious for malignancy.  IMPRESSION: Further evaluation is suggested for possible asymmetry in the left breast.  RECOMMENDATION: Diagnostic mammogram and possibly ultrasound of the left breast. (Code:FI-L-13M)  The patient will be contacted regarding the findings, and additional imaging will be scheduled.  BI-RADS CATEGORY  0: Incomplete: Need additional imaging evaluation.  Electronically Signed   By: Rosaline Collet M.D.   On: 08/07/2023 12:44 Note: Reviewed        Physical Exam  Vitals: BP (!) 118/52   Pulse 76    Temp (!) 97 F (36.1 C)   Resp 16   Ht 5' 3 (1.6 m)   Wt 170 lb (77.1 kg)   SpO2 98%   BMI 30.11 kg/m  BMI: Estimated body mass index is 30.11 kg/m as calculated from the following:   Height as of this encounter: 5' 3 (1.6 m).   Weight as of this encounter: 170 lb (77.1 kg). Ideal: Ideal body weight: 52.4 kg (115 lb 8.3 oz) Adjusted ideal body weight: 62.3 kg (137 lb 5 oz) General appearance: Well nourished, well developed, and well hydrated. In no apparent acute distress Mental status: Alert, oriented x 3 (person, place, & time)       Respiratory: No evidence of acute respiratory distress Eyes: PERLA  Musculoskeletal: +LBP Neck pain Cervical Spine Exam  Skin & Axial Inspection: No masses, redness, edema, swelling, or associated skin lesions Alignment: Symmetrical Functional ROM: Pain restricted ROM      Stability: No instability detected Muscle Tone/Strength: Functionally intact. No obvious neuro-muscular anomalies detected. Sensory (Neurological): Musculoskeletal pain pattern Palpation: No palpable anomalies             Assessment   Diagnosis Status  1. Chronic bilateral low back pain without sciatica   2. Chronic pain syndrome   3. Chronic use of opiate for therapeutic purpose   4. Chronic pain of lower extremity, bilateral   5. Chronic hip pain, bilateral   6. Chronic neck pain   7. Cervical facet syndrome   8. Lumbar facet joint pain    Controlled Controlled Controlled   Updated Problems: No problems updated.  Plan of Care  Problem-specific:  Assessment and Plan    Chronic neck and back pain due to cervical spondylosis: The patient continues experiencing chronic neck pain and back pain, which she managed with current pain medication regimen, allows her functional mobility and pain relief.  The patient continues to provide care for her mother with dementia, which contributes to increased stress and difficulty managing her pain at times. Chronic pain  management complicated by medication refill discrepancy. - Performed serum drug screening due to previous fall. - Instructed her to bring pill bottle to future appointments.   Chronic pain syndrome: Patient's pain is well-controlled with hydrocodone , will continue on current medication regimen.  Prescribing drug monitoring (PMP) reviewed, findings consistent with the use of prescribed medication and no evidence of narcotic misuse or abuse.  Routine drug screen order.  Patient is unable to provide urine.  It was instructed to bring pill bottle to next visit. Schedule follow-up in 90 days for medication management.   Chronic use of therapeutic opioid purpose: Medications   HYDROcodone -acetaminophen  (NORCO/VICODIN) 5-325 MG tablet    Sig: Take 1 tablet by mouth every 4 (four) hours as needed for severe pain (pain score 7-10). Must last 30 days  Dispense:  180 tablet    Refill:  0    DO NOT: delete (not duplicate); no partial-fill (will deny script to complete), no refill request (F/U required). DISPENSE: 1 day early if closed on fill date. WARN: No CNS-depressants within 8 hrs of med.   HYDROcodone -acetaminophen  (NORCO/VICODIN) 5-325 MG tablet    Sig: Take 1 tablet by mouth every 4 (four) hours as needed for severe pain (pain score 7-10). Must last 30 days    Dispense:  180 tablet    Refill:  0    DO NOT: delete (not duplicate); no partial-fill (will deny script to complete), no refill request (F/U required). DISPENSE: 1 day early if closed on fill date. WARN: No CNS-depressants within 8 hrs of med.   HYDROcodone -acetaminophen  (NORCO/VICODIN) 5-325 MG tablet    Sig: Take 1 tablet by mouth every 4 (four) hours as needed for severe pain (pain score 7-10). Must last 30 days    Dispense:  180 tablet    Refill:  0    DO NOT: delete (not duplicate); no partial-fill (will deny script to complete), no refill request (F/U required). DISPENSE: 1 day early if closed on fill date. WARN: No CNS-depressants  within 8 hrs of med.        Zoe Sanchez has a current medication list which includes the following long-term medication(s): albuterol, amitriptyline, dicyclomine, fluticasone, metformin, metoprolol tartrate, montelukast, omeprazole, pregabalin , propranolol er, rosuvastatin, salmeterol, estradiol, hydrocodone -acetaminophen , [START ON 08/15/2024] hydrocodone -acetaminophen , and [START ON 09/14/2024] hydrocodone -acetaminophen .  Pharmacotherapy (Medications Ordered): Meds ordered this encounter  Medications   HYDROcodone -acetaminophen  (NORCO/VICODIN) 5-325 MG tablet    Sig: Take 1 tablet by mouth every 4 (four) hours as needed for severe pain (pain score 7-10). Must last 30 days    Dispense:  180 tablet    Refill:  0    DO NOT: delete (not duplicate); no partial-fill (will deny script to complete), no refill request (F/U required). DISPENSE: 1 day early if closed on fill date. WARN: No CNS-depressants within 8 hrs of med.   HYDROcodone -acetaminophen  (NORCO/VICODIN) 5-325 MG tablet    Sig: Take 1 tablet by mouth every 4 (four) hours as needed for severe pain (pain score 7-10). Must last 30 days    Dispense:  180 tablet    Refill:  0    DO NOT: delete (not duplicate); no partial-fill (will deny script to complete), no refill request (F/U required). DISPENSE: 1 day early if closed on fill date. WARN: No CNS-depressants within 8 hrs of med.   HYDROcodone -acetaminophen  (NORCO/VICODIN) 5-325 MG tablet    Sig: Take 1 tablet by mouth every 4 (four) hours as needed for severe pain (pain score 7-10). Must last 30 days    Dispense:  180 tablet    Refill:  0    DO NOT: delete (not duplicate); no partial-fill (will deny script to complete), no refill request (F/U required). DISPENSE: 1 day early if closed on fill date. WARN: No CNS-depressants within 8 hrs of med.   Orders:  Orders Placed This Encounter  Procedures   Drug Screen 10 W/Conf, Serum    Release to patient:   Immediate        Return  in about 3 months (around 10/16/2024) for (F2F), (MM), Emmy Blanch NP.    Recent Visits No visits were found meeting these conditions. Showing recent visits within past 90 days and meeting all other requirements Today's Visits Date Type Provider Dept  07/16/24 Office Visit Jamillah Camilo K, NP  [facility]  Showing today's visits and meeting all other requirements Future Appointments No visits were found meeting these conditions. Showing future appointments within next 90 days and meeting all other requirements  I discussed the assessment and treatment plan with the patient. The patient was provided an opportunity to ask questions and all were answered. The patient agreed with the plan and demonstrated an understanding of the instructions.  Patient advised to call back or seek an in-person evaluation if the symptoms or condition worsens.  I personally spent a total of 30 minutes in the care of the patient today including preparing to see the patient, getting/reviewing separately obtained history, performing a medically appropriate exam/evaluation, counseling and educating, placing orders, referring and communicating with other health care professionals, documenting clinical information in the EHR, independently interpreting results, communicating results, and coordinating care.   Note by: Rose-Marie Hickling K Bridget Westbrooks, NP (TTS and AI technology used. I apologize for any typographical errors that were not detected and corrected.) Date: 07/16/2024; Time: 3:04 PM

## 2024-07-19 LAB — TOXASSURE SELECT 13 (MW), URINE

## 2024-09-25 ENCOUNTER — Other Ambulatory Visit: Payer: Self-pay | Admitting: Family Medicine

## 2024-09-25 DIAGNOSIS — R7989 Other specified abnormal findings of blood chemistry: Secondary | ICD-10-CM

## 2024-09-25 DIAGNOSIS — R822 Biliuria: Secondary | ICD-10-CM

## 2024-09-26 ENCOUNTER — Other Ambulatory Visit: Payer: Self-pay

## 2024-09-26 ENCOUNTER — Emergency Department

## 2024-09-26 ENCOUNTER — Emergency Department
Admission: EM | Admit: 2024-09-26 | Discharge: 2024-09-26 | Disposition: A | Discharge status: Home / Self Care | Attending: Emergency Medicine | Admitting: Emergency Medicine

## 2024-09-26 DIAGNOSIS — W01198A Fall on same level from slipping, tripping and stumbling with subsequent striking against other object, initial encounter: Secondary | ICD-10-CM | POA: Insufficient documentation

## 2024-09-26 DIAGNOSIS — S0990XA Unspecified injury of head, initial encounter: Secondary | ICD-10-CM | POA: Diagnosis present

## 2024-09-26 DIAGNOSIS — S161XXA Strain of muscle, fascia and tendon at neck level, initial encounter: Secondary | ICD-10-CM | POA: Diagnosis not present

## 2024-09-26 DIAGNOSIS — W19XXXA Unspecified fall, initial encounter: Secondary | ICD-10-CM

## 2024-09-26 DIAGNOSIS — I6523 Occlusion and stenosis of bilateral carotid arteries: Secondary | ICD-10-CM | POA: Insufficient documentation

## 2024-09-26 LAB — CBC
HCT: 33.2 % — ABNORMAL LOW (ref 36.0–46.0)
Hemoglobin: 10.7 g/dL — ABNORMAL LOW (ref 12.0–15.0)
MCH: 29.8 pg (ref 26.0–34.0)
MCHC: 32.2 g/dL (ref 30.0–36.0)
MCV: 92.5 fL (ref 80.0–100.0)
Platelets: 208 K/uL (ref 150–400)
RBC: 3.59 MIL/uL — ABNORMAL LOW (ref 3.87–5.11)
RDW: 13.9 % (ref 11.5–15.5)
WBC: 5.8 K/uL (ref 4.0–10.5)
nRBC: 0 % (ref 0.0–0.2)

## 2024-09-26 LAB — BASIC METABOLIC PANEL WITH GFR
Anion gap: 13 (ref 5–15)
BUN: 13 mg/dL (ref 8–23)
CO2: 19 mmol/L — ABNORMAL LOW (ref 22–32)
Calcium: 9.2 mg/dL (ref 8.9–10.3)
Chloride: 106 mmol/L (ref 98–111)
Creatinine, Ser: 0.76 mg/dL (ref 0.44–1.00)
GFR, Estimated: >60 mL/min
Glucose, Bld: 157 mg/dL — ABNORMAL HIGH (ref 70–99)
Potassium: 3.8 mmol/L (ref 3.5–5.1)
Sodium: 138 mmol/L (ref 135–145)

## 2024-09-26 MED ORDER — IOHEXOL 300 MG/ML  SOLN
75.0000 mL | Freq: Once | INTRAMUSCULAR | Status: AC | PRN
  Administered 2024-09-26: 75 mL via INTRAVENOUS

## 2024-09-26 NOTE — ED Triage Notes (Signed)
 Pt to ED via POV from home. Pt reports she was rushing and tripped over hair dryer cord a week ago. Pt reports having pain to left side of head, under right chin and numbness to right hand.

## 2024-09-26 NOTE — Discharge Instructions (Addendum)
 Follow-up with your doctor.  Take all of your regular medications as prescribed.  Return if worsening

## 2024-09-26 NOTE — ED Provider Notes (Signed)
 "  [facility] Provider Note    Event Date/Time   First MD Initiated Contact with Patient 09/26/24 1811     (approximate)   History   Fall   HPI  Zoe Sanchez is a 69 y.o. female history of chronic pain, degenerative neck disease, presents emergency department stating she has been having dizzy spells they think may be related to her medication.  However she has had 2 falls in the past week.  States hit the side of her head and her neck has had numbness and tingling into the right arm.  However she constantly has numbness and tingling in the right arm but now that the hand has gone numb she is concerned there was another injury.  States that she also has a bruised swollen tender area at the anterior the neck that occurred during the second fall.  She states it swollen up under this area.  States the knot in this area was not there prior to the fall.  Denies headache, vomiting      Physical Exam   Triage Vital Signs: ED Triage Vitals [09/26/24 1510]  Encounter Vitals Group     BP 135/71     Girls Systolic BP Percentile      Girls Diastolic BP Percentile      Boys Systolic BP Percentile      Boys Diastolic BP Percentile      Pulse Rate 80     Resp 20     Temp 98 F (36.7 C)     Temp Source Oral     SpO2 96 %     Weight      Height      Head Circumference      Peak Flow      Pain Score 2     Pain Loc      Pain Education      Exclude from Growth Chart     Most recent vital signs: Vitals:   09/26/24 1510  BP: 135/71  Pulse: 80  Resp: 20  Temp: 98 F (36.7 C)  SpO2: 96%     General: Awake, no distress.   CV:  Good peripheral perfusion. regular rate and  rhythm Resp:  Normal effort. Lungs CTA Abd:  No distention.   Other:     ED Results / Procedures / Treatments   Labs (all labs ordered are listed, but only abnormal results are displayed) Labs Reviewed  CBC - Abnormal; Notable for the following components:      Result Value    RBC 3.59 (*)    Hemoglobin 10.7 (*)    HCT 33.2 (*)    All other components within normal limits  BASIC METABOLIC PANEL WITH GFR - Abnormal; Notable for the following components:   CO2 19 (*)    Glucose, Bld 157 (*)    All other components within normal limits     EKG     RADIOLOGY CT head, C-spine X-ray right wrist CT maxillofacial, CT soft tissue neck with contrast due to the bruising and swollen mass noted at the right side of the neck    PROCEDURES:   Procedures  Critical Care: No Chief Complaint  Patient presents with   Fall      MEDICATIONS ORDERED IN ED: Medications  iohexol  (OMNIPAQUE ) 300 MG/ML solution 75 mL (75 mLs Intravenous Contrast Given 09/26/24 2037)     IMPRESSION / MDM / ASSESSMENT AND PLAN / ED COURSE  I reviewed  the triage vital signs and the nursing notes.                              Differential diagnosis includes, but is not limited to, fall, subdural, SAH, CVA, contusion, fracture, strain, dizziness,  Patient's presentation is most consistent with acute illness / injury with system symptoms.    CT head and C-spine, independent review interpretation by me as being negative for any acute abnormality  X-ray right wrist, CT maxillofacial and CT soft tissue neck pending  Due to the dizziness I did order labs   Labs reassuring, I did examine her past labs and her care everywhere in the labs noted and that her hemoglobin was also low at 10.3.  CT head, C-spine, maxillofacial all independently reviewed interpreted by me as being negative for acute abnormality  CT soft tissue of the neck does show a hazy area which radiologist states corresponds with recent trauma as being a hematoma.  Also noted by the radiologist is 50% stenosis of carotid arteries.  X-ray right wrist independent review interpretation by me as being negative for any acute abnormality  Explain all of these findings to the patient.  Encouraged her to follow-up with her  cardiologist concerning carotid stenosis.  Gave her discharge information regarding carotid stenosis.  She is to follow-up with her regular doctors.  Explained to her that the numbness in the hand may resolve as the inflammation decreases from the recent falls.  However she should have her pain specialist evaluate the images we did today to make sure there is nothing that has worsened.  She is in agreement this treatment plan.  She was discharged stable condition.   FINAL CLINICAL IMPRESSION(S) / ED DIAGNOSES   Final diagnoses:  Fall, initial encounter  Minor head injury, initial encounter  Strain of neck muscle, initial encounter  Bilateral carotid artery stenosis     Rx / DC Orders   ED Discharge Orders     None        Note:  This document was prepared using Dragon voice recognition software and may include unintentional dictation errors.    Gasper Devere ORN, PA-C 09/26/24 2204    Willo Dunnings, MD 09/26/24 [phone]  "

## 2024-10-03 ENCOUNTER — Other Ambulatory Visit: Payer: Self-pay | Admitting: Family Medicine

## 2024-10-03 DIAGNOSIS — N6489 Other specified disorders of breast: Secondary | ICD-10-CM

## 2024-10-14 ENCOUNTER — Encounter: Admitting: Nurse Practitioner

## 2024-10-16 ENCOUNTER — Other Ambulatory Visit: Payer: Self-pay | Admitting: Nurse Practitioner

## 2024-10-16 DIAGNOSIS — M545 Low back pain, unspecified: Secondary | ICD-10-CM

## 2024-10-16 DIAGNOSIS — G894 Chronic pain syndrome: Secondary | ICD-10-CM

## 2024-10-16 DIAGNOSIS — G8929 Other chronic pain: Secondary | ICD-10-CM

## 2024-10-16 DIAGNOSIS — M5459 Other low back pain: Secondary | ICD-10-CM

## 2024-10-16 DIAGNOSIS — M47812 Spondylosis without myelopathy or radiculopathy, cervical region: Secondary | ICD-10-CM

## 2024-10-16 DIAGNOSIS — Z79891 Long term (current) use of opiate analgesic: Secondary | ICD-10-CM
